# Patient Record
Sex: Female | Born: 1950
Health system: Southern US, Community
[De-identification: ages and names within clinical notes are randomized; demographics above are authoritative.]

## PROBLEM LIST (undated history)

## (undated) DIAGNOSIS — D573 Sickle-cell trait: Secondary | ICD-10-CM

## (undated) DIAGNOSIS — N289 Disorder of kidney and ureter, unspecified: Secondary | ICD-10-CM

## (undated) DIAGNOSIS — E7800 Pure hypercholesterolemia, unspecified: Secondary | ICD-10-CM

## (undated) DIAGNOSIS — I509 Heart failure, unspecified: Secondary | ICD-10-CM

## (undated) DIAGNOSIS — G4730 Sleep apnea, unspecified: Secondary | ICD-10-CM

## (undated) DIAGNOSIS — I10 Essential (primary) hypertension: Secondary | ICD-10-CM

## (undated) HISTORY — DX: Disorder of kidney and ureter, unspecified: N28.9

## (undated) HISTORY — DX: Sleep apnea, unspecified: G47.30

## (undated) HISTORY — DX: Essential (primary) hypertension: I10

## (undated) HISTORY — DX: Sickle-cell trait: D57.3

## (undated) HISTORY — PX: TUBAL LIGATION: SHX77

## (undated) NOTE — ED Provider Notes (Signed)
 Formatting of this note is different from the original. Images from the original note were not included.   [facility] EMERGENCY DEPARTMENT [address] [place] [address]  Patient Name: Olivia Morales Date arrived in ED: 10/16/23  Patient DOB: [birth year] Medical Record #: [id number]  Primary Care: Patient, None Per ED Provider: Coletta Harlene HERO, MD   Chief Complaint & History of Present Illness   Chief Complaint  Patient presents with   Leg Swelling    Pt flew here from [place]  Tuesday. Pt has been dealing with swelling and high blood pressure at with her PCP and cardiologist in [place]  for the past month. Pts complaint today is increase in leg swelling with pain bilaterally. Pt was taken off of her Hydrochlorothiazide  in February and started on Hydralazine  which she is still on.  Pt has been unable to wear shoes d/t pain since Feb. Pt took tylenol  this morning for pain.    Initial Patient Evaluation Time: 15:42  HPI History Provided by: patient Onset: several months ago  Context/Narrative: Olivia Morales is a 67 y.o. female with a h/o T2DM, HTN, HLD, anemia, and aortic stenosis presenting to the ED by personal vehicle with her husband for evaluation of bilateral lower leg swelling that began on 08/16/23. Pt states she has not been able to wear shoes since swelling began. She lives in [place]  and sees a Cardiologist there. Pt had an echocardiogram done 6 days ago and a nuclear stress test done 2 days ago with her Cardiologist. She flew to [facility] from [place]  yesterday and was evaluated by a physician. Pt has taken furosemide  previously, but does not anymore. She is scheduled to fly back to [place]  in 3 days.  Review of Systems   Review of Systems  Cardiovascular:  Positive for leg swelling.  All other systems reviewed and are negative.  Patient Medical History   Other. No LMP recorded. (Menstrual  status: Other). There are no active problems to display for this patient.  Past Medical History:  Diagnosis Date   Diabetes mellitus    Hyperlipidemia    Hypertension    Sickle cell trait    Past Surgical History:  Procedure Laterality Date   TUBAL LIGATION     No family history on file. Social History   Tobacco Use   Smoking status: Never   Smokeless tobacco: Never  Substance Use Topics   Alcohol use: Yes    Alcohol/week: 2.0 standard drinks of alcohol    Types: 2 Cans of beer per week    Comment: 1 beer last night- typically none   Drug use: No    Comment: Former use   Allergies and Outpatient Medications   Allergies:  Allergies  Allergen Reactions   Ace Inhibitors Edema   I have reviewed the current medication list as reported by the patient or outside source and/or known from the electronic health record.  Physical Examination   ED Triage Vitals [10/16/23 1528] BP: 152/75 Heart Rate: 57 Respiratory Rate: 18 Temp: 36.9 C (98.5 F) Temp src: Temporal SpO2: 99 % Weight: 73 kg (160 lb 15 oz) Height: 1.626 m (5' 4)  Pulse Oximetry Interpretation: Not hypoxic  GCS Assessment: Eye Opening: Spontaneous Best Verbal Response: Oriented Best Motor Response: Obeys commands Glasgow Coma Scale Score: 15  Physical Exam Vitals and nursing note reviewed.  Constitutional:      General: She is awake. She is not in acute distress.  Appearance: Normal appearance. She is not ill-appearing, toxic-appearing or diaphoretic.  HENT:     Head: Normocephalic and atraumatic.  Eyes:     Pupils: Pupils are equal, round, and reactive to light.  Neck:     Vascular: JVD (3+) present.  Cardiovascular:     Rate and Rhythm: Normal rate and regular rhythm.     Pulses: Normal pulses.          Dorsalis pedis pulses are 2+ on the right side and 2+ on the left side.     Heart sounds: Normal heart sounds. No murmur heard.    No friction rub. No gallop.  Pulmonary:     Effort:  Pulmonary effort is normal. No respiratory distress.     Breath sounds: Normal breath sounds. No stridor. No wheezing, rhonchi or rales.  Chest:     Chest wall: No tenderness.  Abdominal:     General: Abdomen is flat. There is no distension.  Musculoskeletal:        General: No deformity or signs of injury. Normal range of motion.     Cervical back: Normal range of motion and neck supple.     Right lower leg: No tenderness (to posterior calf). 2+ Pitting Edema (see image below) present.     Left lower leg: 2+ Pitting Edema present.  Skin:    General: Skin is warm and dry.     Coloration: Skin is not pale.     Findings: No erythema or rash.  Neurological:     Mental Status: She is alert and oriented to person, place, and time.  Psychiatric:        Mood and Affect: Mood normal.   ED Course & Progress Notes   ED Medications and Procedures  CBC AND DIFFERENTIAL - Abnormal; Notable for the following components:      Result Value Ref Range   WBC 3.55 (*) 4.50 - 11.00 10*3/uL   RBC 2.84 (*) 4.20 - 5.40 10*6/uL   HGB 7.7 (*) 12.0 - 16.0 g/dL   HCT 76.3 (*) 61.9 - 52.9 %   RDW-CV 14.1 (*) 11.0 - 14.0 %   Neutrophils Absolute 1.91 (*) 2.0 - 8.0 10*3/uL   All other components within normal limits  COMPREHENSIVE METABOLIC PANEL - Abnormal; Notable for the following components:   Potassium 3.4 (*) 3.5 - 5.0 mmol/L   Chloride 111 (*) 98 - 107 mmol/L   Glucose 68 (*) 74 - 109 mg/dL   Creatinine 8.21 (*) 9.48 - 0.95 mg/dL   Total Protein 5.9 (*) 6.4 - 8.3 g/dL   Albumin 3.4 (*) 3.5 - 5.2 g/dL   Bilirubin Total <9.7 (*) 0.2 - 1.0 mg/dL   Osmolality Calculated 301 (*) 275 - 295 mosm/kg   Creatinine Based eGFR 30 (*) >90 mL/min/[1.73_m2]   Comment: GFR 30-59  Moderate decreased GFR. (CKD Stage 3)   All other components within normal limits  PRO B-TYPE NATRIURETIC PEPTIDE - Abnormal; Notable for the following components:   NT-proBNP 2,910 (*) <300 pg/mL   Comment: (NOTE) NT-proBNP values  below 300 pg/mL have a 98% negative predictive value  for excluding acute CHF. For adults 12 to 69 years of age with intact  renal function, NT-proBNP values greater than 900 pg/mL have a positive predictive value of 83% for the diagnosis of CHF. For patients with an eGFR below 60, a cutoff of 1,200 pg/mL yields a diagnostic sensitivity and specificity of 89% and 72% for acute CHF.    All  other components within normal limits  MAGNESIUM   CK  US  DUPLEX EXTREMITY VEINS BILAT  potassium chloride  20 MEQ/15ML (10%) oral solution (60 mEq Oral Given 10/16/23 1649)  furosemide  (LASIX ) injection (40 mg Intravenous Given 10/16/23 1701)  furosemide  (LASIX ) injection (40 mg Intravenous Given 10/16/23 1758)   Imaging Results       US  Duplex Extremity Veins Bilat (Final result)  Result time 10/16/23 17:45:35    Final result by Pearletha Fonda SAUNDERS, MD (10/16/23 17:45:35)        Impression:   No evidence of deep vein thrombosis.   THIS DOCUMENT HAS BEEN ELECTRONICALLY SIGNED BY Fonda Pearletha MD       Narrative:   PROCEDURE INFORMATION:  Exam: US  Duplex Lower Extremity Veins, Bilateral  Exam date and time: 10/16/2023 4:35 PM  Age: 69 years old  Clinical indication: Swelling (edema) of limb; Lower extremity,  bilateral; Additional info: Swelling, pain   TECHNIQUE:  Imaging protocol: Real-time duplex ultrasound of the bilateral  extremities with 2-D gray scale, color Doppler flow and spectral  waveform analysis including responses to compression and other  maneuvers (when performed) with image documentation. Complete exam  focused on the lower extremity veins.   COMPARISON:  No relevant prior studies available.   FINDINGS:  Right deep veins: Unremarkable. The common femoral, femoral, proximal  profunda femoral and popliteal veins are patent without thrombus.  Normal Doppler waveforms. Normal compressibility and/or augmentation  response.    Left deep veins: Unremarkable. The common  femoral, femoral, proximal  profunda femoral and popliteal veins are patent without thrombus.  Normal Doppler waveforms. Normal compressibility and/or augmentation  response.    Superficial veins: Greater saphenous veins at the saphenofemoral  junctions are patent bilaterally without thrombus.   Soft tissues: Unremarkable.              Progress Notes & Physician Consultations: ED Course as of 10/16/23 2332  Wed Oct 16, 2023  1748 Discussed today's findings with the spouse and patient. Provided discharge instructions and answered questions regarding treatment plan. Issued return-to-ED instructions. The patient is stable for discharge. [KH]  1755 Left message with SW for f/u with rapid heart failure clinic. [SM]  1807 Hemoglobins reviewed under care everywhere: baseline appears to be 9-10.5.  Today is 7.7.  Her LE edema is likely multifactorial - a component of heart failure as is being worked up by cardiologist in [place], as well as acute on chronic anemia secondary to likely renal insufficiency.  Not on a blood thinner.  No black or tarry stools.  Iron studies done in early 2025 were normal.  Once diuresed I do believe that her hgb will be back to her baseline.  She is also chronically leukopenic, may benefit from bone marrow biopsy in the future to look for MDS or other marrow suppressing causes of her CBC abnormalities.    She may be establishing all of her care here in [place]  instead of [place] .  If she does decide to do this (she does want to with cardiology as well, and f/u in the HF clinic), she will need PCP and hematology referral in the future.  [SM]   ED Course User Index [KH] Buzzy Harland CROME, ED Scribe [SM] Coletta Harlene HERO, MD   Clinical Impression and Disposition   Clinical Impression:  1. Bilateral leg edema   2. Anemia, unspecified type   3. Chronic renal impairment, unspecified CKD stage   4. Neutropenia, unspecified type    Disposition:  Discharge  Plan: Discharge Medication List as of 10/16/2023  5:48 PM    START taking these medications   Details  furosemide  (LASIX ) 40 MG tablet Take 1 (one) tablet (40 mg total) by mouth daily., Starting Wed 10/16/2023, Normal   potassium chloride  (MICRO-K ) 10 MEQ CR capsule Take 1 (one) capsule (10 mEq total) by mouth 2 (two) times daily for 5 days., Starting Wed 10/16/2023, Until Mon 10/21/2023, Normal     Follow-Up Providers     Cardiologist in [place] as scheduled    Next Steps: Follow up   [facility] Emergency Department  Specialty: Emergency Medicine   Phone: [phone]    Next Steps: Follow up   Instructions: If symptoms worsen     Summary / Medical Decision Making    Provider Summary Notes:  Medical Decision Making:  Review of External Records: See progress note above.  Differential Diagnoses: Heart failure, DVT, metabolic abnormality, renal failure.  Chronic Illnesses Impacting Care: T2DM, HTN, HLD, anemia, aortic stenosis.  ED Lab and Imaging Orders CBC and differential (Olivia Morales, Harlene HERO, MD) Comprehensive metabolic panel Earlis, Harlene HERO, MD) Magnesium  Earlis, Harlene HERO, MD) CK ([address] Harlene HERO, MD) NT-proBNP Earlis, Harlene HERO, MD) US  Duplex Extremity Veins Bilat Earlis, Harlene HERO, MD)  [place] appears well.  See progress note above for review of prior records and lab values.  SW to assist with HF f/u.  Given lasix  and potassium for home.     I have spoken with the patient and spouse, who expressed clear understanding of everything discussed including today's results and findings, and I provided specific details regarding the plan of care, diagnosis, and prognosis.  I answered all questions and there was agreement with the plan.  I discussed specific symptoms and other reasons to return as well as the importance of follow-up.  The following contributed to my medical decisions:  I have reviewed the patient's chief complaint and available past  medical/surgical history, past social history, medications, and allergies and agree with the nursing documentation.  I have reviewed all the patient's vital signs while in the emergency department.  I ordered and independently viewed/reviewed all laboratory and/or radiology tests performed. I ordered and reviewed all medications given. I reviewed all of the pertinent past medical records available in Epic, [facility], from the patient, and/or from an outside facility. I ordered, reviewed, and independently visualized the telemetry monitor tracing.  Scribe Attestation:  I attest that this documentation has been prepared under the direction and in the presence of Olivia Morales, Harlene HERO, MD.  Electronically Signed: Harland LITTIE Mulligan, ED Scribe  Physician Attestation:  I personally performed the services described in this documentation. All medical record entries made by the scribe were at my direction and in my presence. I have reviewed the chart and discharge instructions (if applicable) and agree that the record reflects my personal performance and is accurate and complete. Electronically Signed: Harlene Heater, MD, JD, FACEP 23:32  Note has been documented by Harland L. Heishman on 10/16/2023  Heater Harlene HERO, MD 10/16/23 2333  Electronically signed by Heater Harlene HERO, MD at 10/16/2023 11:33 PM CDT

## (undated) NOTE — Progress Notes (Signed)
 Formatting of this note might be different from the original. Message received. Pt in need of UP/CV Cards follow up for heart failure. Pt is visiting from [place] and often comes back to the area.   SW spoke with Leonor RN who shares availability for today Thursday, 4/17 @ 10:15 AM or tomorrow Friday, 4/18 @ 9:00 AM with Dr. Dennise. SW voiced appreciation.   SW attempted to follow up with pt. No answer. VM left with the above information. SW requested a call back.   SW received a call back from the pt. Pt shares tomorrow works best and she will be there at 8:45 AM. Clinic address and provider name provided. Pt denies further needs/concerns. SW remains available.  Electronically signed by Fleeta Alan SAILOR, MSW at 10/17/2023  9:19 AM CDT

---

## 2000-08-29 ENCOUNTER — Emergency Department (HOSPITAL_COMMUNITY): Admission: EM | Admit: 2000-08-29 | Discharge: 2000-08-29 | Payer: Self-pay | Admitting: Emergency Medicine

## 2002-01-25 ENCOUNTER — Encounter: Payer: Self-pay | Admitting: Emergency Medicine

## 2002-01-25 ENCOUNTER — Inpatient Hospital Stay (HOSPITAL_COMMUNITY): Admission: EM | Admit: 2002-01-25 | Discharge: 2002-02-03 | Payer: Self-pay | Admitting: Psychiatry

## 2002-01-28 ENCOUNTER — Encounter: Payer: Self-pay | Admitting: Emergency Medicine

## 2002-01-28 ENCOUNTER — Emergency Department (HOSPITAL_COMMUNITY): Admission: EM | Admit: 2002-01-28 | Discharge: 2002-01-28 | Payer: Self-pay | Admitting: Emergency Medicine

## 2002-02-12 ENCOUNTER — Other Ambulatory Visit (HOSPITAL_COMMUNITY): Admission: RE | Admit: 2002-02-12 | Discharge: 2002-03-16 | Payer: Self-pay | Admitting: Psychiatry

## 2004-09-28 ENCOUNTER — Emergency Department (HOSPITAL_COMMUNITY): Admission: EM | Admit: 2004-09-28 | Discharge: 2004-09-28 | Payer: Self-pay | Admitting: Family Medicine

## 2005-04-16 ENCOUNTER — Emergency Department (HOSPITAL_COMMUNITY): Admission: EM | Admit: 2005-04-16 | Discharge: 2005-04-16 | Payer: Self-pay | Admitting: Emergency Medicine

## 2005-09-24 ENCOUNTER — Emergency Department (HOSPITAL_COMMUNITY): Admission: EM | Admit: 2005-09-24 | Discharge: 2005-09-24 | Payer: Self-pay | Admitting: Family Medicine

## 2006-04-10 ENCOUNTER — Emergency Department (HOSPITAL_COMMUNITY): Admission: EM | Admit: 2006-04-10 | Discharge: 2006-04-10 | Payer: Self-pay | Admitting: Family Medicine

## 2007-03-02 ENCOUNTER — Emergency Department (HOSPITAL_COMMUNITY): Admission: EM | Admit: 2007-03-02 | Discharge: 2007-03-02 | Payer: Self-pay | Admitting: Emergency Medicine

## 2007-07-31 ENCOUNTER — Emergency Department (HOSPITAL_COMMUNITY): Admission: EM | Admit: 2007-07-31 | Discharge: 2007-07-31 | Payer: Self-pay | Admitting: Emergency Medicine

## 2009-09-20 ENCOUNTER — Emergency Department (HOSPITAL_COMMUNITY): Admission: EM | Admit: 2009-09-20 | Discharge: 2009-09-20 | Payer: Self-pay | Admitting: Emergency Medicine

## 2009-09-22 ENCOUNTER — Emergency Department (HOSPITAL_COMMUNITY): Admission: EM | Admit: 2009-09-22 | Discharge: 2009-09-22 | Payer: Self-pay | Admitting: Emergency Medicine

## 2010-11-17 NOTE — Discharge Summary (Signed)
Olivia Morales, Olivia Morales                            ACCOUNT NO.:  [id number]   MEDICAL RECORD NO.:  [id number]                   PATIENT TYPE:  IPS   LOCATION:  0302                                 FACILITY:  [facility]   PHYSICIAN:  Jeanice Lim, MD                DATE OF BIRTH:  09/16/1950   DATE OF ADMISSION:  01/25/2002  DATE OF DISCHARGE:  02/03/2002                                 DISCHARGE SUMMARY   IDENTIFYING DATA:  This is a 61 year old widowed African-American female  voluntarily admitted with a history of depression and suicidal thoughts and  cocaine abuse.   MEDICATIONS:  Prinivil, Norvasc and hydrochlorothiazide.   ALLERGIES:  Essentially within normal limits.  Neurologically nonfocal.   LABORATORY DATA:  Routine admission labs essentially within normal limits.   MENTAL STATUS EXAM:  Alert, middle-aged, cooperative female resting in bed  with good eye contact.  Speech clear.  Mood depressed.  Affect teary-eyed.  Thought processes goal directed.  Thought content negative for suicidal,  homicidal and psychotic symptoms.  Cognitively intact.  Judgment and insight  impaired and fair.   ADMISSION DIAGNOSES:   AXIS I:  1. Depressive disorder not otherwise specified.  2. Cocaine dependence.   AXIS II:  None.   AXIS III:  1. Hypertension.  2. Sickle-cell anemia trait.  3. Plantar fasciitis.   AXIS IV:  Moderate (problems with primary support system).   AXIS V:  30/55.   HOSPITAL COURSE:  The patient was admitted and ordered routine p.r.n.  medications and underwent further monitoring and was encouraged to  participate in individual, group and milieu therapy.  The patient was  resumed on Prinivil, Norvasc, hydrochlorothiazide and started on Zoloft  targeting depressive symptoms.  The patient tolerated Zoloft without  significant side effects and reported a decrease in depressive symptoms,  occasional cravings but appeared motivated to be compliant with the  aftercare  plan and remain sober.  The patient was to follow up with her  primary care physician regarding abnormal labs.   CONDITION ON DISCHARGE:  Improved.  Mood was more euthymic.  Affect  brighter.  Thought processes goal directed.  Thought content negative for  dangerous or psychotic symptoms and patient appeared motivated to remain  sober.   DISCHARGE MEDICATIONS:  1. Zoloft 50 mg q.a.m.  2. Anaprox 275 mg, 2 b.i.d. as needed p.r.n.   [facility] February [facility], [facility] at  4 p.m.   DISCHARGE DIAGNOSES:   AXIS I:  1. Depressive disorder not otherwise specified.  2. Cocaine dependence.   AXIS II:  None.   AXIS III:  1. Hypertension.  2. Sickle-cell anemia trait.  3. Plantar fasciitis.   AXIS IV:  Moderate (problems with primary support system).   AXIS V:  Global Assessment of Functioning on discharge 55.  Jeanice Lim, MD    JEM/MEDQ  D:  03/05/2002  T:  03/05/2002  Job:  [phone]

## 2010-11-17 NOTE — H&P (Signed)
[facility]  Patient:    Olivia Morales, Olivia Morales Visit Number: [id number] MRN: [id number]          Service Type: Attending:  Jeanice Lim, M.D. Dictated by:   Candi Leash. Orsini, N.P. Adm. Date:  01/25/02                     Psychiatric Admission Assessment  IDENTIFYING INFORMATION:  A 60 year old widowed African-American female who was voluntarily admitted on January 25, 2002.  HISTORY OF PRESENT ILLNESS:  The patient presents with a history of depression, suicidal thoughts.  Has a history of cocaine abuse, rule out dependence.  The patient has been smoking cocaine for the past 18 years.  The patient reports she is feeling depressed.  She is having conflicts with her daughter.  She feels that they do not care and just want her money.  The patient states she had a recent cocaine binge after she did not hear from her daughter.  She is having passive suicidal thoughts but will contract for safety.  Her sleeping has been decreased, her appetite has been decreased. She has lost 20 pounds.  She denies any psychosis, reports no cravings and states that she will remain sober by keeping close contact with her 6 grandchildren that she has.  PAST PSYCHIATRIC HISTORY:  First admission to [facility]. History of detox in 1993.  No history of a suicide attempt.  SOCIAL HISTORY:   She is a 60 year old widowed African-American female.  She has been widowed since 1999.  She has 3 children, ages 31, 57, and 26.  She lives alone.  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  No legal problems.  FAMILY HISTORY:  Unknown.  ALCOHOL DRUG HISTORY:  Nonsmoker, denies any alcohol use.  The patient has a history of smoking cocaine for the past 18 years, every 2 weeks when she gets paid.  PAST MEDICAL HISTORY:  Primary care Elim Economou is unknown.  Medical problems are plantar fasciitis, sickle cell anemia trait, and hypertension.  MEDICATIONS:  Prinivil 40 mg,  Norvasc 10 mg, hydrochlorothiazide 25 mg.  DRUG ALLERGIES:  No known allergies.  PHYSICAL EXAMINATION:  Performed in the emergency department.  Her urine pregnancy test is negative.  Alcohol level was less than 5.  Urine drug screen was positive for cocaine.  MENTAL STATUS EXAMINATION:  She is an alert, middle-aged, cooperative female, resting in bed, with good eye contact.  Speech is clear, mood is depressed, affect is teary-eyed.  Thought processes are coherent.  No evidence of psychosis, no auditory or visual hallucinations, no suicidal or homicidal ideations.  Cognitive function intact.  Memory is good, judgment impaired, insight is fair.  ADMISSION DIAGNOSES: Axis I:    1. Depressive disorder not otherwise specified.            2. Cocaine dependence. Axis II:   Deferred. Axis III:  Hypertension and sickle cell anemia trait, and plantar fasciitis. Axis IV:   Problems with primary support group, other psychosocial problems. Axis V:    Current is 30, this past year 109.  PLAN:  Voluntary admission to [facility] for depression, suicidal ideation and cocaine abuse.  Contract for safety, check every 15 minutes.  Will obtain labs, initiate Zoloft to decrease depressive symptoms, resume her hypertension medications.  The patient to attend groups.  Have a family session.  Increase coping skills.  Follow up with mental health and remain drug free.  TENTATIVE LENGTH OF CARE:  3-5 days. Dictated by:   Candi Leash. Orsini, N.P. Attending:  Jeanice Lim, M.D. DD:  01/26/02 TD:  01/26/02 Job: [id number]

## 2011-04-13 LAB — CBC
HCT: 34.1 — ABNORMAL LOW
Hemoglobin: 11.6 — ABNORMAL LOW
MCHC: 33.8
MCV: 80.4
Platelets: 300
RBC: 4.25
RDW: 13.8
WBC: 3.8 — ABNORMAL LOW

## 2011-04-13 LAB — POCT CARDIAC MARKERS
CKMB, poc: 1.9
Myoglobin, poc: 75.2
Operator id: 4534
Troponin i, poc: <0.05

## 2011-04-13 LAB — COMPREHENSIVE METABOLIC PANEL
ALT: 16
AST: 25
Albumin: 4.3
Alkaline Phosphatase: 84
BUN: 12
CO2: 25
Calcium: 9.7
Chloride: 106
Creatinine, Ser: 0.92
GFR calc Af Amer: >60
GFR calc non Af Amer: >60
Glucose, Bld: 74
Potassium: 3.4 — ABNORMAL LOW
Sodium: 140
Total Bilirubin: 1.4 — ABNORMAL HIGH
Total Protein: 7.9

## 2011-04-13 LAB — D-DIMER, QUANTITATIVE: D-Dimer, Quant: 0.28

## 2011-04-13 LAB — DIFFERENTIAL
Basophils Absolute: 0
Basophils Relative: 1
Eosinophils Absolute: 0.1
Eosinophils Relative: 2
Lymphocytes Relative: 38
Lymphs Abs: 1.4
Monocytes Absolute: 0.4
Monocytes Relative: 12 — ABNORMAL HIGH
Neutro Abs: 1.8
Neutrophils Relative %: 48

## 2011-06-13 ENCOUNTER — Encounter: Payer: Self-pay | Admitting: Dietician

## 2011-06-13 ENCOUNTER — Encounter: Payer: 59 | Attending: Family Medicine | Admitting: Dietician

## 2011-06-13 DIAGNOSIS — I10 Essential (primary) hypertension: Secondary | ICD-10-CM | POA: Insufficient documentation

## 2011-06-13 DIAGNOSIS — Z713 Dietary counseling and surveillance: Secondary | ICD-10-CM | POA: Insufficient documentation

## 2011-06-13 DIAGNOSIS — E119 Type 2 diabetes mellitus without complications: Secondary | ICD-10-CM | POA: Insufficient documentation

## 2011-06-13 NOTE — Progress Notes (Signed)
  Patient was seen on 06/13/2011 for the first of a series of three diabetes self-management courses at [facility]. The following learning objectives were met by the patient during this course:   Defines the role of glucose and insulin  Identifies type of diabetes and pathophysiology  Defines the diagnostic criteria for diabetes and prediabetes  States the risk factors for Type 2 Diabetes  States the symptoms of Type 2 Diabetes  Defines Type 2 Diabetes treatment goals  Defines Type 2 Diabetes treatment options  States the rationale for glucose monitoring  Identifies A1C, glucose targets, and testing times  Identifies proper sharps disposal  Defines the purpose of a diabetes food plan  Identifies carbohydrate food groups  Defines effects of carbohydrate foods on glucose levels  Identifies carbohydrate choices/grams/food labels  States benefits of physical activity and effect on glucose  Review of suggested activity guidelines  Handouts given during class include:  Type 2 Diabetes: Basics Book  My Food Plan Book  Food and Activity Log  Patient has established the following initial goals:  Increase exercise  Follow a diabetes meal plan  Lose weight  Follow-Up Plan: Attend the series of DM self-management classes

## 2011-06-13 NOTE — Patient Instructions (Signed)
Goals:  Follow Diabetes Meal Plan as instructed  Eat 3 meals and 2 snacks, every 3-5 hrs  Limit carbohydrate intake to 30-45 grams carbohydrate/meal  Limit carbohydrate intake to 15 grams carbohydrate/snack  Add lean protein foods to meals/snacks  Monitor glucose levels as instructed by your doctor  Aim for 30 mins of physical activity daily  Bring food record and glucose log to your next nutrition visit 

## 2011-07-12 ENCOUNTER — Ambulatory Visit: Payer: 59

## 2011-07-19 ENCOUNTER — Ambulatory Visit: Payer: 59

## 2011-09-13 ENCOUNTER — Encounter: Payer: 59 | Attending: Family Medicine | Admitting: *Deleted

## 2011-09-13 DIAGNOSIS — E119 Type 2 diabetes mellitus without complications: Secondary | ICD-10-CM | POA: Insufficient documentation

## 2011-09-13 DIAGNOSIS — Z713 Dietary counseling and surveillance: Secondary | ICD-10-CM | POA: Insufficient documentation

## 2011-09-13 DIAGNOSIS — I10 Essential (primary) hypertension: Secondary | ICD-10-CM | POA: Insufficient documentation

## 2011-09-14 ENCOUNTER — Encounter: Payer: Self-pay | Admitting: *Deleted

## 2011-09-14 NOTE — Progress Notes (Signed)
  Patient was seen on 09/13/2011 for the second of a series of three diabetes self-management courses at the [facility]. The following learning objectives were met by the patient during this course:   Explain basic nutrition maintenance and quality assurance  Describe causes, symptoms and treatment of hypoglycemia and hyperglycemia  Explain how to manage diabetes during illness  Describe the importance of good nutrition for health and healthy eating strategies  List strategies to follow meal plan when dining out  Describe the effects of alcohol on glucose and how to use it safely  Describe problem solving skills for day-to-day glucose challenges  Describe strategies to use when treatment plan needs to change  Identify important factors involved in successful weight loss  Describe ways to remain physically active  Describe the impact of regular activity on insulin resistance  Patient updated their initials goals from Core Class I to include:  Eat more fresh fruits and vegetables  Walk around parking log 15 minutes 3 days a week    Handouts given in class:  Refrigerator magnet for Sick Day Guidelines  [facility] Oral medication/insulin handout  Follow-Up Plan: Patient will attend the final class of the ADA Diabetes Self-Care Education.

## 2011-09-20 ENCOUNTER — Ambulatory Visit: Payer: 59

## 2011-10-31 DIAGNOSIS — I10 Essential (primary) hypertension: Secondary | ICD-10-CM | POA: Insufficient documentation

## 2011-10-31 DIAGNOSIS — E1159 Type 2 diabetes mellitus with other circulatory complications: Secondary | ICD-10-CM | POA: Insufficient documentation

## 2011-10-31 DIAGNOSIS — E119 Type 2 diabetes mellitus without complications: Secondary | ICD-10-CM | POA: Insufficient documentation

## 2011-11-15 ENCOUNTER — Ambulatory Visit: Payer: 59

## 2011-11-22 ENCOUNTER — Encounter: Payer: 59 | Attending: Family Medicine | Admitting: Dietician

## 2011-11-22 DIAGNOSIS — I10 Essential (primary) hypertension: Secondary | ICD-10-CM | POA: Insufficient documentation

## 2011-11-22 DIAGNOSIS — Z713 Dietary counseling and surveillance: Secondary | ICD-10-CM | POA: Insufficient documentation

## 2011-11-22 DIAGNOSIS — E119 Type 2 diabetes mellitus without complications: Secondary | ICD-10-CM | POA: Insufficient documentation

## 2011-11-26 NOTE — Progress Notes (Signed)
  Patient was seen on 11/22/2011 for the third of a series of three diabetes self-management courses at the [facility]. The following learning objectives were met by the patient during this course:    Describe how diabetes changes over time   Identify diabetes complications and ways to prevent them   Describe strategies that can promote heart health including lowering blood pressure and cholesterol   Describe strategies to lower dietary fat and sodium in the diet   Identify physical activities that benefit cardiovascular health   Evaluate success in meeting personal goal   Describe the belief that they can live successfully with diabetes day to day   Establish 2-3 goals that they will plan to diligently work on until they return for the free 66-month follow-up visit  The following handouts were given in class:  3 Month Follow Up Visit handout  Goal setting handout  Class evaluation form  Your patient has established the following 3 month goals for diabetes self-care:  Count carbohydrates at most of my meals and snacks.  Reduce fat in my diet by eating less fried food at two or more meals a day.  Eat more fresh fruits and vegetables.  Increase my activity (for example, take the stairs at least 5 days a week.  Walk more five times a round parking lot = 1 mile.  Follow-Up Plan: Patient will attend a 3 month follow-up visit for diabetes self-management education.

## 2012-02-28 ENCOUNTER — Encounter: Payer: 59 | Attending: Physician Assistant | Admitting: Dietician

## 2012-03-11 ENCOUNTER — Encounter: Payer: Self-pay | Admitting: Cardiology

## 2013-08-31 ENCOUNTER — Encounter (HOSPITAL_COMMUNITY): Payer: Self-pay | Admitting: Emergency Medicine

## 2013-08-31 ENCOUNTER — Emergency Department (HOSPITAL_COMMUNITY)
Admission: EM | Admit: 2013-08-31 | Discharge: 2013-08-31 | Disposition: A | Discharge status: Home / Self Care | Payer: 59 | Attending: Emergency Medicine | Admitting: Emergency Medicine

## 2013-08-31 DIAGNOSIS — K0889 Other specified disorders of teeth and supporting structures: Secondary | ICD-10-CM

## 2013-08-31 DIAGNOSIS — IMO0002 Reserved for concepts with insufficient information to code with codable children: Secondary | ICD-10-CM | POA: Insufficient documentation

## 2013-08-31 DIAGNOSIS — I10 Essential (primary) hypertension: Secondary | ICD-10-CM | POA: Insufficient documentation

## 2013-08-31 DIAGNOSIS — Z8669 Personal history of other diseases of the nervous system and sense organs: Secondary | ICD-10-CM | POA: Insufficient documentation

## 2013-08-31 DIAGNOSIS — Z98811 Dental restoration status: Secondary | ICD-10-CM | POA: Insufficient documentation

## 2013-08-31 DIAGNOSIS — Z79899 Other long term (current) drug therapy: Secondary | ICD-10-CM | POA: Insufficient documentation

## 2013-08-31 DIAGNOSIS — E119 Type 2 diabetes mellitus without complications: Secondary | ICD-10-CM | POA: Insufficient documentation

## 2013-08-31 DIAGNOSIS — K089 Disorder of teeth and supporting structures, unspecified: Secondary | ICD-10-CM | POA: Insufficient documentation

## 2013-08-31 HISTORY — DX: Pure hypercholesterolemia, unspecified: E78.00

## 2013-08-31 MED ORDER — HYDROCODONE-ACETAMINOPHEN 5-325 MG PO TABS: 2.0000 | ORAL_TABLET | ORAL | Status: DC | PRN

## 2013-08-31 MED ORDER — CLINDAMYCIN HCL 150 MG PO CAPS: 150.0000 mg | ORAL_CAPSULE | Freq: Four times a day (QID) | ORAL | Status: DC

## 2013-08-31 NOTE — ED Notes (Signed)
Pt presents with c/o left sided dental pain, head ache and eye pain. States she has been used oragel. Plans to see the dentist but was in too much pain.

## 2013-08-31 NOTE — Discharge Instructions (Signed)

## 2013-08-31 NOTE — ED Provider Notes (Signed)
CSN: [id number]     Arrival date & time 08/31/13  1523 History   First MD Initiated Contact with Patient 08/31/13 1558     Chief Complaint  Patient presents with  . Dental Pain     (Consider location/radiation/quality/duration/timing/severity/associated sxs/prior Treatment) Patient is a 63 y.o. female presenting with tooth pain. The history is provided by the patient. No language interpreter was used.  Dental Pain Location:  Upper and lower Upper teeth location:  14/LU 1st molar Lower teeth location:  29/RL 2nd bicuspid Quality:  Aching and sharp Severity:  Severe Onset quality:  Gradual Duration:  2 days Timing:  Constant Progression:  Worsening Relieved by:  Nothing Worsened by:  Nothing tried Ineffective treatments:  Acetaminophen Associated symptoms: facial pain   Risk factors: diabetes   Pain in gum upper and lower  Past Medical History  Diagnosis Date  . Diabetes mellitus   . Hypertension   . Sleep apnea    No past surgical history on file. No family history on file. History  Substance Use Topics  . Smoking status: Never Smoker   . Smokeless tobacco: Not on file  . Alcohol Use: Yes   OB History   Grav Para Term Preterm Abortions TAB SAB Ect Mult Living                 Review of Systems  All other systems reviewed and are negative.      Allergies  Review of patient's allergies indicates no known allergies.  Home Medications   Current Outpatient Rx  Name  Route  Sig  Dispense  Refill  . amLODipine (NORVASC) 10 MG tablet   Oral   Take 10 mg by mouth daily.          . benzocaine (KANK-A MOUTH PAIN) 20 % SOLN   Mouth/Throat   Use as directed 1 application in the mouth or throat as needed for mouth pain.         . benzocaine (ORAJEL) 10 % mucosal gel   Mouth/Throat   Use as directed 1 application in the mouth or throat as needed for mouth pain.         . diphenhydramine-acetaminophen (TYLENOL PM) 25-500 MG TABS   Oral   Take 2 tablets by  mouth every 4 (four) hours as needed (pain).         . fluticasone (FLONASE) 50 MCG/ACT nasal spray   Each Nare   Place 1 spray into both nostrils daily.         . hydrochlorothiazide (MICROZIDE) 12.5 MG capsule   Oral   Take 1 capsule by mouth daily.         Marland Kitchen losartan (COZAAR) 25 MG tablet   Oral   Take 25 mg by mouth daily.           . metFORMIN (GLUCOPHAGE-XR) 500 MG 24 hr tablet   Oral   Take 1,000 mg by mouth daily with breakfast.          . metoprolol succinate (TOPROL-XL) 50 MG 24 hr tablet   Oral   Take 1 tablet by mouth daily.         Marland Kitchen PRESCRIPTION MEDICATION   Both Eyes   Place 1 drop into both eyes daily. For dry eye and allergies.          BP 127/65  Pulse 59  Temp(Src) 98.4 F (36.9 C) (Oral)  Resp 20  SpO2 96% Physical Exam  Nursing note and vitals  reviewed. Constitutional: She is oriented to person, place, and time. She appears well-developed and well-nourished.  HENT:  Head: Normocephalic.  No obvious cavity,  No abscess  Multiple fillings  Eyes: Pupils are equal, round, and reactive to light.  Cardiovascular: Normal rate.   Pulmonary/Chest: Effort normal.  Musculoskeletal: Normal range of motion.  Neurological: She is alert and oriented to person, place, and time. She has normal reflexes.  Skin: Skin is warm.  Psychiatric: She has a normal mood and affect.    ED Course  Procedures (including critical care time) Labs Review Labs Reviewed - No data to display Imaging Review No results found.   EKG Interpretation None      MDM [facility].   No fever, no sign of abscess.  I will treat pain and start antibiotic   Final diagnoses:  Toothache        Elson AreasLeslie K Sofia, PA-C 08/31/13 [address]1608  Leslie K La TierraSofia, [place] JerseyPA-C 08/31/13 [phone]

## 2013-09-01 NOTE — ED Provider Notes (Signed)
Medical screening examination/treatment/procedure(s) were performed by non-physician practitioner and as supervising physician I was immediately available for consultation/collaboration.   EKG Interpretation None        Olivia SkeensJoshua M Porscha Axley, MD 09/01/13 [phone]

## 2013-09-14 DIAGNOSIS — E1169 Type 2 diabetes mellitus with other specified complication: Secondary | ICD-10-CM | POA: Insufficient documentation

## 2013-09-14 DIAGNOSIS — E785 Hyperlipidemia, unspecified: Secondary | ICD-10-CM

## 2014-01-11 DIAGNOSIS — N183 Chronic kidney disease, stage 3 unspecified: Secondary | ICD-10-CM | POA: Insufficient documentation

## 2014-01-15 ENCOUNTER — Other Ambulatory Visit (HOSPITAL_COMMUNITY): Payer: Self-pay | Admitting: *Deleted

## 2014-01-18 ENCOUNTER — Inpatient Hospital Stay (HOSPITAL_COMMUNITY): Admission: RE | Admit: 2014-01-18 | Payer: 59 | Source: Ambulatory Visit

## 2014-01-25 ENCOUNTER — Ambulatory Visit (HOSPITAL_COMMUNITY)
Admission: RE | Admit: 2014-01-25 | Discharge: 2014-01-25 | Disposition: A | Discharge status: Home / Self Care | Payer: 59 | Source: Ambulatory Visit | Attending: Nephrology | Admitting: Nephrology

## 2014-01-25 DIAGNOSIS — E1129 Type 2 diabetes mellitus with other diabetic kidney complication: Secondary | ICD-10-CM | POA: Insufficient documentation

## 2014-01-25 DIAGNOSIS — N183 Chronic kidney disease, stage 3 unspecified: Secondary | ICD-10-CM | POA: Diagnosis present

## 2014-01-25 DIAGNOSIS — I129 Hypertensive chronic kidney disease with stage 1 through stage 4 chronic kidney disease, or unspecified chronic kidney disease: Secondary | ICD-10-CM | POA: Insufficient documentation

## 2014-01-25 MED ORDER — SODIUM CHLORIDE 0.9 % IV SOLN
1020.0000 mg | Freq: Once | INTRAVENOUS | Status: AC
  Administered 2014-01-25: 1020 mg via INTRAVENOUS
  Filled 2014-01-25: qty 34

## 2015-07-02 ENCOUNTER — Emergency Department (HOSPITAL_COMMUNITY)
Admission: EM | Admit: 2015-07-02 | Discharge: 2015-07-02 | Disposition: A | Discharge status: Home / Self Care | Payer: 59 | Attending: Emergency Medicine | Admitting: Emergency Medicine

## 2015-07-02 ENCOUNTER — Encounter (HOSPITAL_COMMUNITY): Payer: Self-pay

## 2015-07-02 ENCOUNTER — Emergency Department (HOSPITAL_COMMUNITY): Payer: 59

## 2015-07-02 DIAGNOSIS — Z8669 Personal history of other diseases of the nervous system and sense organs: Secondary | ICD-10-CM | POA: Diagnosis not present

## 2015-07-02 DIAGNOSIS — Z792 Long term (current) use of antibiotics: Secondary | ICD-10-CM | POA: Diagnosis not present

## 2015-07-02 DIAGNOSIS — M79672 Pain in left foot: Secondary | ICD-10-CM

## 2015-07-02 DIAGNOSIS — Y9389 Activity, other specified: Secondary | ICD-10-CM | POA: Insufficient documentation

## 2015-07-02 DIAGNOSIS — Y9289 Other specified places as the place of occurrence of the external cause: Secondary | ICD-10-CM | POA: Insufficient documentation

## 2015-07-02 DIAGNOSIS — E119 Type 2 diabetes mellitus without complications: Secondary | ICD-10-CM | POA: Diagnosis not present

## 2015-07-02 DIAGNOSIS — W228XXA Striking against or struck by other objects, initial encounter: Secondary | ICD-10-CM | POA: Diagnosis not present

## 2015-07-02 DIAGNOSIS — S99922A Unspecified injury of left foot, initial encounter: Secondary | ICD-10-CM | POA: Diagnosis present

## 2015-07-02 DIAGNOSIS — Z7951 Long term (current) use of inhaled steroids: Secondary | ICD-10-CM | POA: Diagnosis not present

## 2015-07-02 DIAGNOSIS — Z79899 Other long term (current) drug therapy: Secondary | ICD-10-CM | POA: Insufficient documentation

## 2015-07-02 DIAGNOSIS — I10 Essential (primary) hypertension: Secondary | ICD-10-CM | POA: Diagnosis not present

## 2015-07-02 DIAGNOSIS — Y998 Other external cause status: Secondary | ICD-10-CM | POA: Diagnosis not present

## 2015-07-02 MED ORDER — IBUPROFEN 800 MG PO TABS
800.0000 mg | ORAL_TABLET | Freq: Once | ORAL | Status: AC
  Administered 2015-07-02: 800 mg via ORAL
  Filled 2015-07-02: qty 1

## 2015-07-02 MED ORDER — IBUPROFEN 800 MG PO TABS: 800.0000 mg | ORAL_TABLET | Freq: Three times a day (TID) | ORAL | Status: DC

## 2015-07-02 NOTE — ED Provider Notes (Signed)
CSN: [id number]     Arrival date & time 07/02/15  1009 History   First MD Initiated Contact with Patient 07/02/15 1039     Chief Complaint  Patient presents with  . Foot Pain     (Consider location/radiation/quality/duration/timing/severity/associated sxs/prior Treatment) Patient is a 64 y.o. female presenting with lower extremity pain. The history is provided by the patient.  Foot Pain This is a new problem. The current episode started yesterday. The problem occurs constantly. The problem has been unchanged. Pertinent negatives include no fever, joint swelling, numbness or weakness. The symptoms are aggravated by walking. She has tried nothing for the symptoms. The treatment provided no relief.   Olivia Morales is a 64 y.o. female with PMH significant for DM, HTN, HLD who presents with moderate, constant, worsening left foot pain since yesterday.  She does not report anything in particular that occurred yesterday.  Her boyfriend reports she stepped off the curb and seemed to jam her foot.  No twisting or ankle roll.  No meds PTA.  She denies numbness, weakness, fever, or any other pain.   Past Medical History  Diagnosis Date  . Diabetes mellitus   . Hypertension   . Sleep apnea   . Hypercholesteremia    History reviewed. No pertinent past surgical history. History reviewed. No pertinent family history. Social History  Substance Use Topics  . Smoking status: Never Smoker   . Smokeless tobacco: None  . Alcohol Use: No   OB History    No data available     Review of Systems  Constitutional: Negative for fever.  Musculoskeletal: Negative for joint swelling.  Neurological: Negative for weakness and numbness.   All other systems negative unless otherwise stated in HPI    Allergies  Review of patient's allergies indicates no known allergies.  Home Medications   Prior to Admission medications   Medication Sig Start Date End Date Taking? Authorizing Provider    amLODipine (NORVASC) 10 MG tablet Take 10 mg by mouth daily.     Historical Provider, MD  benzocaine (KANK-A MOUTH PAIN) 20 % SOLN Use as directed 1 application in the mouth or throat as needed for mouth pain.    Historical Provider, MD  benzocaine (ORAJEL) 10 % mucosal gel Use as directed 1 application in the mouth or throat as needed for mouth pain.    Historical Provider, MD  clindamycin (CLEOCIN) 150 MG capsule Take 1 capsule (150 mg total) by mouth every 6 (six) hours. 08/31/13   Elson Areas, PA-C  diphenhydramine-acetaminophen (TYLENOL PM) 25-500 MG TABS Take 2 tablets by mouth every 4 (four) hours as needed (pain).    Historical Provider, MD  fluticasone (FLONASE) 50 MCG/ACT nasal spray Place 1 spray into both nostrils daily.    Historical Provider, MD  hydrochlorothiazide (MICROZIDE) 12.5 MG capsule Take 1 capsule by mouth daily. 07/17/13   Historical Provider, MD  HYDROcodone-acetaminophen (NORCO/VICODIN) 5-325 MG per tablet Take 2 tablets by mouth every 4 (four) hours as needed. 08/31/13   Elson Areas, PA-C  losartan (COZAAR) 25 MG tablet Take 25 mg by mouth daily.      Historical Provider, MD  metFORMIN (GLUCOPHAGE-XR) 500 MG 24 hr tablet Take 1,000 mg by mouth daily with breakfast.     Historical Provider, MD  metoprolol succinate (TOPROL-XL) 50 MG 24 hr tablet Take 1 tablet by mouth daily. 08/22/13   Historical Provider, MD  PRESCRIPTION MEDICATION Place 1 drop into both eyes daily. For dry eye and allergies.  Historical Provider, MD   BP 148/85 mmHg  Pulse 62  Temp(Src) 98.1 F (36.7 C) (Oral)  Resp 20  SpO2 100% Physical Exam  Constitutional: She is oriented to person, place, and time. She appears well-developed and well-nourished.  HENT:  Head: Atraumatic.  Eyes: Conjunctivae are normal. No scleral icterus.  Neck: No tracheal deviation present.  Cardiovascular:  Pulses:      Dorsalis pedis pulses are 2+ on the right side, and 2+ on the left side.  Capillary refill less  than 3 seconds.   Pulmonary/Chest: Effort normal. No respiratory distress.  Musculoskeletal: Normal range of motion. She exhibits tenderness.       Right ankle: Normal.       Left ankle: She exhibits normal range of motion, no swelling, no ecchymosis, no deformity, no laceration and normal pulse. No tenderness.       Right foot: Normal.       Left foot: There is tenderness. There is normal range of motion, no swelling, normal capillary refill, no crepitus, no deformity and no laceration.       Feet:  Left foot TTP along dorsal aspect.  Neurological: She is alert and oriented to person, place, and time.  Strength and sensation intact bilaterally throughout lower extremities.   Skin: Skin is warm and dry.  No swelling, erythema, or temperature change.  Psychiatric: She has a normal mood and affect. Her behavior is normal.    ED Course  Procedures (including critical care time) Labs Review Labs Reviewed - No data to display  Imaging Review Dg Foot Complete Left  07/02/2015  CLINICAL DATA:  Left foot pain.  Twisting injury yesterday. EXAM: LEFT FOOT - COMPLETE 3+ VIEW COMPARISON:  None. FINDINGS: There is no evidence of fracture or dislocation. There is no evidence of arthropathy or other focal bone abnormality. Soft tissues are unremarkable. IMPRESSION: Negative. Electronically Signed   By: Francene BoyersJames  Maxwell M.D.   On: 07/02/2015 11:13   I have personally reviewed and evaluated these images and lab results as part of my medical decision-making.   EKG Interpretation None      MDM   Final diagnoses:  Left foot pain    Patient presents with left foot pain since yesterday.  No fever, numbness, weakness.  VSS, NAD.  On exam, NVI.  FAROM.  TTP along dorsal aspect of left foot.  Plain films negative.  Will apply ace wrap, post op shoe, and motrin.  Doubt neuropathy or arterial/venous occlusion.  Evaluation does not show pathology requiring ongoing emergent intervention or admission. Pt is  hemodynamically stable and mentating appropriately. Discussed findings/results and plan with patient/guardian, who agrees with plan. All questions answered. Return precautions discussed and outpatient follow up given.      Olivia FowlerKayla Shavon Zenz, PA-C 07/02/15 1135  Laurence Spatesachel Morgan Little, MD 07/02/15 [phone]

## 2015-07-02 NOTE — Discharge Instructions (Signed)

## 2015-07-02 NOTE — ED Notes (Signed)
Pt states pain to upper and lower foot.  Pt boyfriend states she stepped wrong on curb yesterday.  Pt denies wrist pain.

## 2016-02-06 ENCOUNTER — Institutional Professional Consult (permissible substitution): Payer: 59 | Admitting: Neurology

## 2016-02-06 ENCOUNTER — Telehealth: Payer: Self-pay

## 2016-02-06 NOTE — Telephone Encounter (Signed)
I spoke to pt and advised her that Dr. Vickey Hugerohmeier is out sick today and we will need to reschedule her appt. Pt is agreeable to a 02/08/16 appt at 11:30. Pt verbalized understanding.

## 2016-02-08 ENCOUNTER — Encounter: Payer: Self-pay | Admitting: Neurology

## 2016-02-08 ENCOUNTER — Ambulatory Visit (INDEPENDENT_AMBULATORY_CARE_PROVIDER_SITE_OTHER): Payer: 59 | Admitting: Neurology

## 2016-02-08 VITALS — BP 118/78 | HR 84 | Ht 64.0 in | Wt 188.0 lb

## 2016-02-08 DIAGNOSIS — R351 Nocturia: Secondary | ICD-10-CM | POA: Diagnosis not present

## 2016-02-08 DIAGNOSIS — E1310 Other specified diabetes mellitus with ketoacidosis without coma: Secondary | ICD-10-CM | POA: Diagnosis not present

## 2016-02-08 DIAGNOSIS — I10 Essential (primary) hypertension: Secondary | ICD-10-CM | POA: Diagnosis not present

## 2016-02-08 DIAGNOSIS — Z9114 Patient's other noncompliance with medication regimen: Secondary | ICD-10-CM

## 2016-02-08 DIAGNOSIS — G4733 Obstructive sleep apnea (adult) (pediatric): Secondary | ICD-10-CM

## 2016-02-08 DIAGNOSIS — G4730 Sleep apnea, unspecified: Secondary | ICD-10-CM

## 2016-02-08 DIAGNOSIS — R0683 Snoring: Secondary | ICD-10-CM | POA: Diagnosis not present

## 2016-02-08 DIAGNOSIS — G4710 Hypersomnia, unspecified: Secondary | ICD-10-CM | POA: Diagnosis not present

## 2016-02-08 DIAGNOSIS — E1110 Type 2 diabetes mellitus with ketoacidosis without coma: Secondary | ICD-10-CM

## 2016-02-08 NOTE — Progress Notes (Addendum)
[facility]IC   Provider:  Melvyn Novas, M D  Referring Provider: Lucila Maine Primary Care Physician:  Devra Dopp, MD  Chief Complaint  Patient presents with  . New Patient (Initial Visit)    hasn't used cpap in years, been over 10 years    HPI:  Olivia Morales is a 65 y.o. female , seen here as a referral from [place] [facility] for a sleep study,   Chief complaint according to patient : " More than 10 years ago I visited my son in [place] and my son recorded my snoring and apnea "  Not my fianc is reporting the same thing.  At the time she returned from [place] where she was evaluated in the sleep study for obstructive sleep apnea, was diagnosed and prescribed a CPAP. She has not been using it regularly and she says that her grandkids were scared of it. Comorbidities exist the patient is considered obese, has diabetes, is also followed for chronic kidney disease stage III by Dr. Sheralyn Boatman, she has hypertension is on 4 medications for hypertension. She reports that at times she gets short of breath, her legs can swell up,  she is very thirsty -more than she used to be,  and she has a lot of joint pain. She also has polyuria and nocturia interrupting her sleep at night. I reviewed the patient's medications which include amlodipine, hydrochlorothiazide, losartan, metoprolol, metformin, omeprazole, Lipitor, and Tylenol PM.  A CPAP download was obtained the patient used the device last night for 2 hours and 54 minutes, with an average AHI of 14.7 this is actually a BiPAP device expiratory pressure relief is 12 cm inspiratory pressure support 16 cm water. She had a sleep study at [facility] center on [facility], the facility does not longer exist. Her last sleep study was over 10 years ago.  Sleep habits are as follows: The patient usually watches TV and the news at 11 PM and goes to the back room between midnight and 1 AM. In her bedroom she still has a  TV running in the background., She falls asleep very promptly but likes the TV in the background. She sleeps on her sides -usually with 2 pillows for head support.  Her fianc shares a bed with her and has noted that she snores almost immediately after she falls asleep. Sometimes she falls asleep while they have conversations in bed. She almost hourly goes to the bathroom at night, she has urge incontinence during the day as well. She needs often to change her underwear because of this. Does  seem to have vivid dreams, and often. The dream is very real to her, and she and he is dreaming the same theme over and over when she has a sleep interruption and goes back to dreaming. Her fianc also states that she sleep talks. She doesn't have to go to the bathroom nearly as often and daytime as she does at night. She can sleep up to 11 or 12 noon as long as she has no appointments in the morning. She usually eats breakfast takes a nap she says that it's time for lunch and after lunch she wants to take another nap she is very excessively sleepy in daytime but feels that she doesn't get enough sleep at night. Her naps take about two times a day, for 60 minutes. Usually on a sofa or ina a recliner .    Sleep medical history and family sleep  history:  The patient is a single child, there is no family history of a sleep disorder she does not remember having any difficulties in childhood related to sleep.  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 Widowed 17 years ago, her husband had a good retirement plan. She orked only part time since.  Children= daughters are 7147, 2743, son is 5440. Grandchildren 8 , great-grand children 4 . Smoking history- no tobacco use , ETOH - "2- 3 times a week, liquor a couple of drinks a month".  Caffeine use, no coffee, Soda a couple a day, iced tea- 2-3 glasses a day.   Review of Systems: Out of a complete 14 system review, the patient complains of only the following symptoms,  and all other reviewed systems are negative. Epworth score 12 , Fatigue severity score 57  , depression score 3/15    Social History   Social History  . Marital status: Widowed    Spouse name: N/A  . Number of children: N/A  . Years of education: N/A   Occupational History  . Not on file.   Social History Main Topics  . Smoking status: Never Smoker  . Smokeless tobacco: Never Used  . Alcohol use 8.4 oz/week    14 Cans of beer per week  . Drug use: No  . Sexual activity: Not on file   Other Topics Concern  . Not on file   Social History Narrative  . No narrative on file    Family History  Problem Relation Age of Onset  . Sickle cell anemia Father     Past Medical History:  Diagnosis Date  . Diabetes mellitus   . Hypercholesteremia   . Hypertension   . Sleep apnea     Past Surgical History:  Procedure Laterality Date  . TUBAL LIGATION      Current Outpatient Prescriptions  Medication Sig Dispense Refill  . amLODipine (NORVASC) 10 MG tablet Take 10 mg by mouth daily.     Marland Kitchen. atorvastatin (LIPITOR) 40 MG tablet Take 40 mg by mouth daily.    . diphenhydramine-acetaminophen (TYLENOL PM) 25-500 MG TABS Take 2 tablets by mouth every 4 (four) hours as needed (pain).    . hydrochlorothiazide (MICROZIDE) 12.5 MG capsule Take 1 capsule by mouth daily.    Marland Kitchen. HYDROcodone-acetaminophen (NORCO/VICODIN) 5-325 MG per tablet Take 2 tablets by mouth every 4 (four) hours as needed. 10 tablet 0  . ibuprofen (ADVIL,MOTRIN) 800 MG tablet Take 1 tablet (800 mg total) by mouth 3 (three) times daily. 21 tablet 0  . losartan (COZAAR) 25 MG tablet Take 25 mg by mouth daily.      . metFORMIN (GLUCOPHAGE-XR) 500 MG 24 hr tablet Take 1,000 mg by mouth daily with breakfast.     . metoprolol succinate (TOPROL-XL) 50 MG 24 hr tablet Take 1 tablet by mouth daily.    Marland Kitchen. omeprazole (PRILOSEC) 20 MG capsule Take 20 mg by mouth daily.    Marland Kitchen. PRESCRIPTION MEDICATION Place 1 drop into both eyes daily.  For dry eye and allergies.     No current facility-administered medications for this visit.     Allergies as of 02/08/2016 - Review Complete 02/08/2016  Allergen Reaction Noted  . Ace inhibitors  10/31/2011    Vitals: BP 118/78   Pulse 84   Ht 5\' 4"  (1.626 m)   Wt 188 lb (85.3 kg)   BMI 32.27 kg/m  Last Weight:  Wt Readings from Last 1 Encounters:  02/08/16  188 lb (85.3 kg)   WUJ:WJXB mass index is 32.27 kg/m.     Last Height:   Ht Readings from Last 1 Encounters:  02/08/16 5\' 4"  (1.626 m)    Physical exam:  General: The patient is awake, alert and appears not in acute distress. The patient is well groomed. Head: Normocephalic, atraumatic. Neck is supple. Mallampati 3,  neck circumference:15.5 . Nasal airflow congested , TMJ is not  evident . Retrognathia is seen.  Cardiovascular:  Regular rate and rhythm, without  murmurs or carotid bruit, and without distended neck veins. Respiratory: Lungs are clear to auscultation. Skin:  Withevidence of ankle edema,  Trunk: BMI is elevated . The patient's posture is erect.   Neurologic exam : The patient is awake and alert, oriented to place and time.  Memory subjective described as intact. Attention span & concentration ability appears normal. Speech is fluent,  without dysarthria, dysphonia or aphasia. Mood and affect are appropriate.  Cranial nerves: Pupils are equal and briskly reactive to light. Extraocular movements  in vertical and horizontal planes intact and without nystagmus. Visual fields by finger perimetry are intact. Hearing to finger rub intact. Facial sensation intact to fine touch. Facial motor strength is symmetric and tongue and uvula move midline. Shoulder shrug was symmetrical.  Motor exam:  Normal tone, muscle bulk and symmetric strength in all extremities. Sensory:  Fine touch, pinprick and vibration/ Proprioception  was normal. Coordination: Rapid alternating movements/  Finger-to-nose maneuver  normal without  evidence of ataxia, dysmetria or tremor. Gait and station: Patient walks without assistive device and is able unassisted to climb up to the exam table. Strength within normal limits. Stance is stable and normal.  Deep tendon reflexes: in the  upper and lower extremities are symmetric and intact.   The patient was advised of the nature of the diagnosed sleep disorder , the treatment options and risks for general a health and wellness arising from not treating the condition.  I spent more than 45 minutes of face to face time with the patient. Greater than 50% of time was spent in counseling and coordination of care. We have discussed the diagnosis and differential and I answered the patient's questions.     Assessment:  After physical and neurologic examination, review of laboratory studies,  Personal review of imaging studies, reports of other /same  Imaging studies ,  Results of polysomnography/ neurophysiology testing and pre-existing records as far as provided in visit., my assessment is   1)  Olivia Morales has certainly still all the risk factors for obstructive sleep apnea.  Her download is not longer valid because her machine has not had supplies for  maintenance and I think that the high residual AHI could be related to mask leaks.  What I need her to do is to return for a split-night polysomnography to get properly diagnosed and to have a new machine ordered for her.  The current machine cannot be remotely downloaded but I was curious why she was on a BiPAP and have not found an explanation for it. I would like to invite her for a split-night polysomnography but advise the technologist to split her after one hour if enough apnea is present.  This way we may be able to prove if CPAP is a possibility or not and change to BiPAP.  2) the patient should be using an interface of her choice and comfort she seems to be a mouth breather, and she often has nasal congestion. For this  reason she may  need to have a full face mask. She wakes up frequently with morning headaches so I would like capnography and oximetry to accompaniy this study.  3)obesity - BMI over 35, needs to lose weight.    HTN, DM, nocturia  may benefit form OSA treatment.     Plan:  Treatment plan and additional workup : Weight loss plan to develop with PCP, RV after SPLIT with capnography.    Porfirio Mylar Axle Parfait MD  02/08/2016   CC: Teena Irani, Pa-c [address] [place], [place] [address]

## 2016-02-16 ENCOUNTER — Institutional Professional Consult (permissible substitution): Payer: 59 | Admitting: Neurology

## 2016-02-20 ENCOUNTER — Telehealth: Payer: Self-pay | Admitting: Neurology

## 2016-02-20 NOTE — Telephone Encounter (Signed)
[employer] states not enough criteria to meet the requirements for Split sleep study.  HST is suggested.

## 2016-02-21 NOTE — Telephone Encounter (Signed)
I love their suggestions, she needs an attended study. We watch for central apnea in a patient on narcotics, can we relate that ?

## 2016-03-13 ENCOUNTER — Telehealth: Payer: Self-pay | Admitting: Neurology

## 2016-03-13 DIAGNOSIS — R0683 Snoring: Secondary | ICD-10-CM

## 2016-03-13 NOTE — Telephone Encounter (Signed)
Pt called said she has left several msg for Dawn to call her back reg a sleep study but she has not had a return call. She said she does not want to speak with anyone but Dr D.

## 2016-03-16 NOTE — Telephone Encounter (Signed)
split-night polysomnography but advise the technologist to split her after one hour if enough apnea is present. This way we may be able to prove if CPAP is a possibility or not and change to BiPAP.  2) the patient should be using an interface of her choice and comfort she seems to be a mouth breather, and she often has nasal congestion. For this reason she may need to have a full face mask. She wakes up frequently with morning headaches so I would like capnography and possible oxygen supplementation to accompanies this study.

## 2016-03-16 NOTE — Telephone Encounter (Signed)
This patient qualifies for attended sleep study based on clinical history.   

## 2016-03-19 NOTE — Addendum Note (Signed)
Addended by: Geronimo RunningINKINS, Susan Bleich A on: 03/19/2016 02:07 PM   Modules accepted: Orders

## 2016-03-19 NOTE — Telephone Encounter (Signed)
I spoke to Dr. Vickey Hugerohmeier. She is agreeable to an HST to test for baseline apnea, and then bring pt in for an attended study. Order placed.

## 2016-03-19 NOTE — Telephone Encounter (Signed)
Patient will need HST first to prove she still has sleep apnea. Then we can bring her in for cpap/bipap study. Can we get an HST order?

## 2016-03-22 ENCOUNTER — Encounter (INDEPENDENT_AMBULATORY_CARE_PROVIDER_SITE_OTHER): Payer: 59 | Admitting: Neurology

## 2016-03-22 DIAGNOSIS — G4733 Obstructive sleep apnea (adult) (pediatric): Secondary | ICD-10-CM | POA: Diagnosis not present

## 2016-03-22 DIAGNOSIS — R0683 Snoring: Secondary | ICD-10-CM

## 2016-03-27 ENCOUNTER — Telehealth: Payer: Self-pay

## 2016-03-27 DIAGNOSIS — G4733 Obstructive sleep apnea (adult) (pediatric): Secondary | ICD-10-CM

## 2016-03-27 NOTE — Telephone Encounter (Signed)
I spoke to pt regarding sleep study results. I advised her that her sleep study revealed moderately severe degree of sleep apnea with oxygen desaturations and that Dr. Vickey Hugerohmeier recommends a cpap titration study. Pt is agreeable to this. I advised her that our sleep lab will be working on getting the cpap titration approved, but it could take a few weeks. Pt verbalized understanding of results. Pt had no questions at this time but was encouraged to call back if questions arise.

## 2016-04-25 ENCOUNTER — Telehealth: Payer: Self-pay | Admitting: Neurology

## 2016-04-25 NOTE — Telephone Encounter (Signed)
[employer] denied CPAP and presented the opportunity to do a peer to peer within 14 days of 04/24/16.  Phone # for peer to peer  [phone]

## 2016-04-26 ENCOUNTER — Telehealth: Payer: Self-pay | Admitting: Neurology

## 2016-04-26 DIAGNOSIS — G4733 Obstructive sleep apnea (adult) (pediatric): Secondary | ICD-10-CM

## 2016-04-26 NOTE — Telephone Encounter (Signed)
[employer] refuses attended sleep CPAP titration with capnography.  I will order auto cpap . cd

## 2016-04-27 NOTE — Telephone Encounter (Signed)
[facility] .   reference number [id number] , today Friday 27th of October at 13;20 hours. Dr. Reva BoresJamie Yuntuni(?) .

## 2016-04-30 NOTE — Telephone Encounter (Signed)
I spoke to pt and advised her that her cpap titration was denied and that Dr. Vickey Hugerohmeier recommends auto pap. Pt is agreeable to this. I advised her that an order for cpap will be sent to [facility]. A follow up appt was made for 07/17/16 at 9:30am. Pt verbalized understanding.

## 2016-04-30 NOTE — Telephone Encounter (Signed)
Had to order HST , order is in. CD

## 2016-05-09 NOTE — Telephone Encounter (Signed)
Pt called in stating she has been denied coverage. A letter was received telling her about why. She would like a call back to discuss the appeal. Please call and advise [phone]

## 2016-05-09 NOTE — Telephone Encounter (Signed)
I called Olivia Morales. She got a letter in the mail saying that her cpap titraiton was denied and she wants to appeal it because she knows that she needs a cpap. I explained to her that we already knew her cpap titration was denied. However, Dr. Vickey Hugerohmeier has already opted to place her on a cpap, and [facility] should be calling to discuss if that got approved and when that can be set up. I gave her [facility]'s phone number and asked her to call them. I will also reach out to [facility] to find out what is going on. Olivia Morales verbalized understanding.

## 2016-07-17 ENCOUNTER — Telehealth: Payer: Self-pay | Admitting: Neurology

## 2016-07-17 ENCOUNTER — Telehealth: Payer: Self-pay

## 2016-07-17 ENCOUNTER — Ambulatory Visit: Payer: Self-pay | Admitting: Neurology

## 2016-07-17 NOTE — Telephone Encounter (Signed)
Non compliant with CPAP, less than 2 days over a period of 90 days ! She has rescheduled appointment 3 times,  now no showed for todays appointment .  May return to PCP, Olivia Morales.  CD

## 2016-07-17 NOTE — Telephone Encounter (Signed)
Pt did not show for their appt with Dr. Dohmeier today.  

## 2016-07-20 ENCOUNTER — Encounter: Payer: Self-pay | Admitting: Neurology

## 2017-03-05 DIAGNOSIS — Z6835 Body mass index (BMI) 35.0-35.9, adult: Secondary | ICD-10-CM

## 2017-09-30 ENCOUNTER — Other Ambulatory Visit: Payer: Self-pay | Admitting: Family Medicine

## 2017-09-30 DIAGNOSIS — E2839 Other primary ovarian failure: Secondary | ICD-10-CM

## 2017-10-23 ENCOUNTER — Inpatient Hospital Stay
Admission: RE | Admit: 2017-10-23 | Discharge: 2017-10-23 | Disposition: A | Discharge status: Home / Self Care | Payer: 59 | Source: Ambulatory Visit | Attending: Family Medicine | Admitting: Family Medicine

## 2017-11-21 ENCOUNTER — Other Ambulatory Visit: Payer: 59

## 2017-12-09 ENCOUNTER — Other Ambulatory Visit: Payer: Self-pay

## 2017-12-09 ENCOUNTER — Emergency Department (HOSPITAL_BASED_OUTPATIENT_CLINIC_OR_DEPARTMENT_OTHER): Payer: Medicare Other

## 2017-12-09 ENCOUNTER — Encounter (HOSPITAL_BASED_OUTPATIENT_CLINIC_OR_DEPARTMENT_OTHER): Payer: Self-pay | Admitting: Emergency Medicine

## 2017-12-09 ENCOUNTER — Emergency Department (HOSPITAL_BASED_OUTPATIENT_CLINIC_OR_DEPARTMENT_OTHER)
Admission: EM | Admit: 2017-12-09 | Discharge: 2017-12-09 | Disposition: A | Discharge status: Home / Self Care | Payer: Medicare Other | Attending: Emergency Medicine | Admitting: Emergency Medicine

## 2017-12-09 DIAGNOSIS — S82832A Other fracture of upper and lower end of left fibula, initial encounter for closed fracture: Secondary | ICD-10-CM

## 2017-12-09 DIAGNOSIS — E119 Type 2 diabetes mellitus without complications: Secondary | ICD-10-CM | POA: Diagnosis not present

## 2017-12-09 DIAGNOSIS — I10 Essential (primary) hypertension: Secondary | ICD-10-CM | POA: Insufficient documentation

## 2017-12-09 DIAGNOSIS — Y929 Unspecified place or not applicable: Secondary | ICD-10-CM | POA: Insufficient documentation

## 2017-12-09 DIAGNOSIS — W19XXXA Unspecified fall, initial encounter: Secondary | ICD-10-CM | POA: Diagnosis not present

## 2017-12-09 DIAGNOSIS — S8992XA Unspecified injury of left lower leg, initial encounter: Secondary | ICD-10-CM | POA: Diagnosis present

## 2017-12-09 DIAGNOSIS — Y939 Activity, unspecified: Secondary | ICD-10-CM | POA: Diagnosis not present

## 2017-12-09 DIAGNOSIS — Y998 Other external cause status: Secondary | ICD-10-CM | POA: Diagnosis not present

## 2017-12-09 LAB — CBC WITH DIFFERENTIAL/PLATELET
Basophils Absolute: 0 10*3/uL (ref 0.0–0.1)
Basophils Relative: 1 %
EOS PCT: 4 %
Eosinophils Absolute: 0.1 10*3/uL (ref 0.0–0.7)
HCT: 31.5 % — ABNORMAL LOW (ref 36.0–46.0)
Hemoglobin: 10.7 g/dL — ABNORMAL LOW (ref 12.0–15.0)
LYMPHS ABS: 1 10*3/uL (ref 0.7–4.0)
LYMPHS PCT: 28 %
MCH: 27.2 pg (ref 26.0–34.0)
MCHC: 34 g/dL (ref 30.0–36.0)
MCV: 80.2 fL (ref 78.0–100.0)
Monocytes Absolute: 0.3 10*3/uL (ref 0.1–1.0)
Monocytes Relative: 9 %
Neutro Abs: 2.1 10*3/uL (ref 1.7–7.7)
Neutrophils Relative %: 58 %
PLATELETS: 298 10*3/uL (ref 150–400)
RBC: 3.93 MIL/uL (ref 3.87–5.11)
RDW: 13.4 % (ref 11.5–15.5)
WBC: 3.5 10*3/uL — AB (ref 4.0–10.5)

## 2017-12-09 LAB — BASIC METABOLIC PANEL
Anion gap: 9 (ref 5–15)
BUN: 23 mg/dL — AB (ref 6–20)
CALCIUM: 9.5 mg/dL (ref 8.9–10.3)
CO2: 28 mmol/L (ref 22–32)
Chloride: 102 mmol/L (ref 101–111)
Creatinine, Ser: 1.34 mg/dL — ABNORMAL HIGH (ref 0.44–1.00)
GFR calc Af Amer: 47 mL/min — ABNORMAL LOW (ref >60)
GFR calc non Af Amer: 41 mL/min — ABNORMAL LOW (ref >60)
GLUCOSE: 194 mg/dL — AB (ref 65–99)
POTASSIUM: 3.7 mmol/L (ref 3.5–5.1)
SODIUM: 139 mmol/L (ref 135–145)

## 2017-12-09 MED ORDER — OXYCODONE-ACETAMINOPHEN 5-325 MG PO TABS: 1.0000 | ORAL_TABLET | Freq: Four times a day (QID) | ORAL | 0 refills | Status: DC | PRN

## 2017-12-09 MED ORDER — OXYCODONE-ACETAMINOPHEN 5-325 MG PO TABS
1.0000 | ORAL_TABLET | Freq: Once | ORAL | Status: AC
  Administered 2017-12-09: 1 via ORAL
  Filled 2017-12-09: qty 1

## 2017-12-09 MED FILL — OXYCODONE-ACETAMINOPHEN 5-3: 5-325 | 3 days supply | Qty: 20 | Fill #0

## 2017-12-09 NOTE — ED Triage Notes (Signed)
Pt states she fell today, injured left lower leg.  Painful to move, unable bear weight.

## 2017-12-09 NOTE — ED Notes (Signed)
Patient transported to X-ray 

## 2017-12-09 NOTE — ED Provider Notes (Signed)
Emergency Department Provider Note   I have reviewed the triage vital signs and the nursing notes.   HISTORY  Chief Complaint Fall   HPI Olivia Morales is a 67 y.o. female with medical problems as documented below the presents to the emergency department today secondary to an unwitnessed fall.  Patient does not remember how she fell but just knows that she heard a pop and feels like her ankle was twisted in an abnormal way.  She subsequently had severe pain in her left ankle left hand left proximal tibia area as well.  Unable to ambulate onto her left ankle secondary to the pain.  Sure she did not hit her head.  She did fall a few days ago while in the bathtub standing on a step stool.  No injuries from that fall that she recognizes. No other associated or modifying symptoms.    Past Medical History:  Diagnosis Date  . Diabetes mellitus   . Hypercholesteremia   . Hypertension   . Sleep apnea     Patient Active Problem List   Diagnosis Date Noted  . 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 Nocturia more than twice per night 02/08/2016  . Noncompliance with CPAP treatment 02/08/2016    Past Surgical History:  Procedure Laterality Date  . TUBAL LIGATION      Current Outpatient Rx  . Order #: [id number] Class: Historical Med  . Order #: [id number] Class: Historical Med  . Order #: [id number] Class: Historical Med  . Order #: [id number] Class: Historical Med  . Order #: [id number] Class: Print  . Order #: [id number] Class: Print  . Order #: [id number] Class: Historical Med  . Order #: [id number] Class: Historical Med  . Order #: [id number] Class: Historical Med  . Order #: [id number] Class: Historical Med  . Order #: [id number] Class: Print  . Order #: [id number] Class: Historical Med    Allergies Ace inhibitors  Family History  Problem Relation Age of Onset  . Sickle cell anemia Father     Social History Social History   Tobacco Use  . Smoking status: Never Smoker  . Smokeless  tobacco: Never Used  Substance Use Topics  . Alcohol use: Yes    Alcohol/week: 8.4 oz    Types: 14 Cans of beer per week  . Drug use: No    Review of Systems  All other systems negative except as documented in the HPI. All pertinent positives and negatives as reviewed in the HPI. ____________________________________________   PHYSICAL EXAM:  VITAL SIGNS: ED Triage Vitals  Enc Vitals Group     BP 12/09/17 0918 (!) 149/87     Pulse Rate 12/09/17 0918 (!) 54     Resp 12/09/17 0918 18     Temp 12/09/17 0918 98.7 F (37.1 C)     Temp Source 12/09/17 0918 Oral     SpO2 12/09/17 0918 100 %     Weight 12/09/17 0918 190 lb (86.2 kg)     Height 12/09/17 0918 5\' 4"  (1.626 m)    Constitutional: Alert and oriented. Well appearing and in no acute distress. Eyes: Conjunctivae are normal. PERRL. EOMI. Head: Atraumatic. Nose: No congestion/rhinnorhea. Mouth/Throat: Mucous membranes are moist.  Oropharynx non-erythematous. Neck: No stridor.  No meningeal signs.   Cardiovascular: Normal rate, regular rhythm. Good peripheral circulation. Grossly normal heart sounds.   Respiratory: Normal respiratory effort.  No retractions. Lungs CTAB. Gastrointestinal: Soft and nontender. No distention.  Musculoskeletal: LLE: proximal fibular ttp, ankle swollen with pain on ROM. Pulse present.  Neurologic:  Normal  speech and language. No gross focal neurologic deficits are appreciated.  Skin:  Skin is warm, dry and intact. No rash noted.   ____________________________________________   LABS (all labs ordered are listed, but only abnormal results are displayed)  Labs Reviewed  CBC WITH DIFFERENTIAL/PLATELET - Abnormal; Notable for the following components:      Result Value   WBC 3.5 (*)    Hemoglobin 10.7 (*)    HCT 31.5 (*)    All other components within normal limits  BASIC METABOLIC PANEL - Abnormal; Notable for the following components:   Glucose, Bld 194 (*)    BUN 23 (*)    Creatinine,  Ser 1.34 (*)    GFR calc non Af Amer 41 (*)    GFR calc Af Amer 47 (*)    All other components within normal limits   ____________________________________________  EKG   EKG Interpretation  Date/Time:  Monday December 09 2017 10:20:25 EDT Ventricular Rate:  58 PR Interval:    QRS Duration: 137 QT Interval:  468 QTC Calculation: 460 R Axis:   -67 Text Interpretation:  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 Left leg injury and pain. Initial encounter. EXAM: LEFT TIBIA AND FIBULA - 2 VIEW COMPARISON:  None. FINDINGS: An oblique fracture of the distal fibular diaphysis is seen which shows minimal displacement. No other fractures or bone lesions identified. IMPRESSION: Oblique fracture of distal fibular diaphysis, with minimal displacement. Electronically Signed   By: Myles Rosenthal M.D.   On: 12/09/2017 10:17   Dg Ankle Complete Left  Result Date: 12/09/2017 CLINICAL DATA:  Fall, left lower leg injury. EXAM: LEFT ANKLE COMPLETE - 3+ VIEW COMPARISON:  None. FINDINGS: There is an oblique fracture through the distal fibular shaft with minimal displacement. No visible distal tibial abnormality. Ankle mortise is intact. IMPRESSION: Minimally displaced distal fibular shaft fracture. Electronically Signed   By: Charlett Nose M.D.   On: 12/09/2017 10:11   Dg Hand Complete Left  Result Date: 12/09/2017 CLINICAL DATA:  Fall today. Left hand injury and pain. Initial encounter. EXAM: LEFT HAND - COMPLETE 3+ VIEW COMPARISON:  None. FINDINGS: There is no evidence of fracture or dislocation. There is no evidence of arthropathy or other focal bone abnormality. Soft tissues are unremarkable. IMPRESSION: Negative. Electronically Signed   By: Myles Rosenthal M.D.   On: 12/09/2017 10:14   Dg Foot Complete  Left  Result Date: 12/09/2017 CLINICAL DATA:  Fall.  Unable to bear weight.  Left lower leg pain. EXAM: LEFT FOOT - COMPLETE 3+ VIEW COMPARISON:  None. FINDINGS: Oblique fracture noted through the distal fibular shaft. No acute bony abnormality within the left foot. Joint spaces are maintained. IMPRESSION: Oblique fracture through the distal left fibular shaft. Electronically Signed   By: Charlett Nose M.D.   On: 12/09/2017 10:12    ____________________________________________   PROCEDURES  Procedure(s) performed:   Procedures   ____________________________________________   INITIAL IMPRESSION / ASSESSMENT AND PLAN / ED COURSE  Labs/ecg for unwitnessed fall. xr of affected body parts otherwise.   Fibula fracture. Cam boot placed. Ambulates. Pain med rx. Referral to non-op ortho.  New bbb but not likely related to fall. Labs ok. Stable for discharge.      Pertinent labs & imaging results that were available during my  care of the patient were reviewed by me and considered in my medical decision making (see chart for details).  ____________________________________________  FINAL CLINICAL IMPRESSION(S) / ED DIAGNOSES  Final diagnoses:  Closed fracture of distal end of left fibula, unspecified fracture morphology, initial encounter     MEDICATIONS GIVEN DURING THIS VISIT:  Medications  oxyCODONE-acetaminophen (PERCOCET/ROXICET) 5-325 MG per tablet 1 tablet (1 tablet Oral Given 12/09/17 1016)     NEW OUTPATIENT MEDICATIONS STARTED DURING THIS VISIT:  Discharge Medication List as of 12/09/2017 11:48 AM    START taking these medications   Details  oxyCODONE-acetaminophen (PERCOCET) 5-325 MG tablet Take 1-2 tablets by mouth every 6 (six) hours as needed for severe pain., Starting Mon 12/09/2017, Print        Note:  This note was prepared with assistance of Dragon voice recognition software. Occasional wrong-word or sound-a-like substitutions may have occurred due to the  inherent limitations of voice recognition software.   Ikram Riebe, Barbara Cower, MD 12/10/17 [phone]

## 2017-12-09 NOTE — Discharge Instructions (Signed)
You have a fracture of the fibula on your left ankle. This is the outer bone of your leg.

## 2017-12-13 DIAGNOSIS — Z91199 Patient's noncompliance with other medical treatment and regimen due to unspecified reason: Secondary | ICD-10-CM | POA: Insufficient documentation

## 2017-12-17 ENCOUNTER — Ambulatory Visit: Payer: Medicare Other | Admitting: Family Medicine

## 2017-12-17 ENCOUNTER — Encounter: Payer: Self-pay | Admitting: Family Medicine

## 2017-12-17 DIAGNOSIS — S99912A Unspecified injury of left ankle, initial encounter: Secondary | ICD-10-CM | POA: Diagnosis not present

## 2017-12-17 MED ORDER — OXYCODONE-ACETAMINOPHEN 5-325 MG PO TABS: 1.0000 | ORAL_TABLET | ORAL | 0 refills | Status: DC | PRN

## 2017-12-17 MED FILL — OXYCODONE-ACETAMINOPHEN 5-3: 5-325 | 5 days supply | Qty: 30 | Fill #0

## 2017-12-17 NOTE — Patient Instructions (Signed)
You have a distal fibula fracture. Wear boot when up and around. Try not to put any weight on this leg. Ok to take boot off to wash, ice the area, when sleeping, to do motion exercises. Icing 15 minutes at a time 3-4 times a day. Elevate above your heart as much as possible for swelling. Come out of boot twice a day to do simple up/down exercises of ankle. Ibuprofen 600mg  three times a day with food OR aleve 2 tabs twice a day with food for pain and inflammation. Percocet as needed for severe pain. Follow up with me in 2 weeks for reevaluation.

## 2017-12-19 ENCOUNTER — Encounter: Payer: Self-pay | Admitting: Family Medicine

## 2017-12-19 DIAGNOSIS — S99912D Unspecified injury of left ankle, subsequent encounter: Secondary | ICD-10-CM | POA: Insufficient documentation

## 2017-12-19 NOTE — Progress Notes (Signed)
PCP: Devra DoppHowell, Tamieka, MD  Subjective:   HPI: Patient is a 67 y.o. female here for left ankle injury.  Patient reports on 6/10 she accidentally fell and believes she inverted her left ankle. Immediate pain and swelling primarily lateral left ankle. Pain continues to be 5/10 with up to 10/10 level sharp pain at times. She is wearing cam walker and tolerating this. Interested in using a knee scooter. No prior injuries to this ankle. No other skin changes, numbness  Past Medical History:  Diagnosis Date  . Diabetes mellitus   . Hypercholesteremia   . Hypertension   . Sleep apnea     Current Outpatient Medications on File Prior to Visit  Medication Sig Dispense Refill  . Dulaglutide (TRULICITY) 0.75 MG/0.5ML SOPN Inject into the skin.    Marland Kitchen. glipiZIDE (GLUCOTROL XL) 10 MG 24 hr tablet TAKE 1 TABLET BY MOUTH  DAILY    . amLODipine (NORVASC) 10 MG tablet Take 10 mg by mouth daily.     Marland Kitchen. atorvastatin (LIPITOR) 40 MG tablet Take 40 mg by mouth daily.    . diphenhydramine-acetaminophen (TYLENOL PM) 25-500 MG TABS Take 2 tablets by mouth every 4 (four) hours as needed (pain).    . hydrochlorothiazide (MICROZIDE) 12.5 MG capsule Take 1 capsule by mouth daily.    Marland Kitchen. losartan (COZAAR) 25 MG tablet Take 25 mg by mouth daily.      . metFORMIN (GLUCOPHAGE-XR) 500 MG 24 hr tablet Take 1,000 mg by mouth daily with breakfast.     . metoprolol succinate (TOPROL-XL) 50 MG 24 hr tablet Take 1 tablet by mouth daily.    Marland Kitchen. omeprazole (PRILOSEC) 20 MG capsule Take 20 mg by mouth daily.    Marland Kitchen. PRESCRIPTION MEDICATION Place 1 drop into both eyes daily. For dry eye and allergies.     No current facility-administered medications on file prior to visit.     Past Surgical History:  Procedure Laterality Date  . TUBAL LIGATION      Allergies  Allergen Reactions  . Ace Inhibitors     Cough    Social History   Socioeconomic History  . Marital status: Widowed    Spouse name: Not on file  . Number of  children: Not on file  . Years of education: Not on file  . Highest education level: Not on file  Occupational History  . Not on file  Social Needs  . Financial resource strain: Not on file  . Food insecurity:    Worry: Not on file    Inability: Not on file  . Transportation needs:    Medical: Not on file    Non-medical: Not on file  Tobacco Use  . Smoking status: Never Smoker  . Smokeless tobacco: Never Used  Substance and Sexual Activity  . Alcohol use: Yes    Alcohol/week: 8.4 oz    Types: 14 Cans of beer per week  . Drug use: No  . Sexual activity: Not on file  Lifestyle  . Physical activity:    Days per week: Not on file    Minutes per session: Not on file  . Stress: Not on file  Relationships  . Social connections:    Talks on phone: Not on file    Gets together: Not on file    Attends religious service: Not on file    Active member of club or organization: Not on file    Attends meetings of clubs or organizations: Not on file    Relationship  status: Not on file  . Intimate partner violence:    Fear of current or ex partner: Not on file    Emotionally abused: Not on file    Physically abused: Not on file    Forced sexual activity: Not on file  Other Topics Concern  . Not on file  Social History Narrative  . Not on file    Family History  Problem Relation Age of Onset  . Sickle cell anemia Father     BP (!) 144/77   Pulse (!) 56   Ht 5\' 4"  (1.626 m)   Wt 188 lb (85.3 kg)   BMI 32.27 kg/m   Review of Systems: See HPI above.     Objective:  Physical Exam:  Gen: NAD, comfortable in exam room  Left ankle: Mod lateral swelling.  Mild discoloration and bruising.  No other deformity. Mod limitation ROM all directions.  Strength not tested with known fracture. TTP distal fibula.  No deltoid ligament, other tenderness of foot/ankle. Negative ant drawer and talar tilt.   Thompsons test negative. NV intact distally.  Right ankle: No  deformity. FROM with 5/5 strength. No tenderness to palpation. NVI distally.   MSK u/s left ankle:  Cortical irregularity seen distal fibula consistent with oblique fracture without callus formation or displacement.  Assessment & Plan:  1. Left ankle injury - Independently reviewed radiographs and noted distal fibula oblique fracture without displacement.  Mortise is intact and no medial tenderness to warrant concern for maisonneuve fracture.  Today's ultrasound without displacement but no early healing seen yet.  Continue cam walker.  Knee scooter, advised not to bear weight.  Icing, elevation.  Ibuprofen or aleve with percocet as needed.  F/u in 2 weeks.

## 2017-12-19 NOTE — Assessment & Plan Note (Signed)
Independently reviewed radiographs and noted distal fibula oblique fracture without displacement.  Mortise is intact and no medial tenderness to warrant concern for maisonneuve fracture.  Today's ultrasound without displacement but no early healing seen yet.  Continue cam walker.  Knee scooter, advised not to bear weight.  Icing, elevation.  Ibuprofen or aleve with percocet as needed.  F/u in 2 weeks.

## 2018-01-03 ENCOUNTER — Ambulatory Visit (HOSPITAL_BASED_OUTPATIENT_CLINIC_OR_DEPARTMENT_OTHER)
Admission: RE | Admit: 2018-01-03 | Discharge: 2018-01-03 | Disposition: A | Discharge status: Home / Self Care | Payer: Medicare Other | Source: Ambulatory Visit | Attending: Family Medicine | Admitting: Family Medicine

## 2018-01-03 ENCOUNTER — Encounter: Payer: Self-pay | Admitting: Family Medicine

## 2018-01-03 ENCOUNTER — Ambulatory Visit: Payer: Medicare Other | Admitting: Family Medicine

## 2018-01-03 VITALS — BP 123/75 | HR 60 | Ht 64.0 in | Wt 188.0 lb

## 2018-01-03 DIAGNOSIS — S82402D Unspecified fracture of shaft of left fibula, subsequent encounter for closed fracture with routine healing: Secondary | ICD-10-CM | POA: Diagnosis not present

## 2018-01-03 DIAGNOSIS — S99912D Unspecified injury of left ankle, subsequent encounter: Secondary | ICD-10-CM | POA: Diagnosis present

## 2018-01-03 MED ORDER — OXYCODONE-ACETAMINOPHEN 5-325 MG PO TABS: 1.0000 | ORAL_TABLET | Freq: Four times a day (QID) | ORAL | 0 refills | Status: DC | PRN

## 2018-01-03 MED FILL — OXYCODONE-ACETAMINOPHEN 5-3: 5-325 | 5 days supply | Qty: 20 | Fill #0

## 2018-01-03 NOTE — Progress Notes (Signed)
PCP: Devra Dopp, MD  Subjective:   HPI: Patient is a 67 y.o. female here for left ankle injury.  6/18: Patient reports on 6/10 she accidentally fell and believes she inverted her left ankle. Immediate pain and swelling primarily lateral left ankle. Pain continues to be 5/10 with up to 10/10 level sharp pain at times. She is wearing cam walker and tolerating this. Interested in using a knee scooter. No prior injuries to this ankle. No other skin changes, numbness  7/5: Patient reports she's doing well. Takes ibuprofen during day with oxycodone in evenings if needed. Pain currently is 0/10 in the boot. Wearing cam walker when ambulating. Using a cane. Unfortunately her insurance denied knee scooter - she's doing to look into this and try other medical supply stores. + swelling but no skin changes.  Past Medical History:  Diagnosis Date  . Diabetes mellitus   . Hypercholesteremia   . Hypertension   . Sleep apnea     Current Outpatient Medications on File Prior to Visit  Medication Sig Dispense Refill  . amLODipine (NORVASC) 10 MG tablet Take 10 mg by mouth daily.     Marland Kitchen atorvastatin (LIPITOR) 40 MG tablet Take 40 mg by mouth daily.    . diphenhydramine-acetaminophen (TYLENOL PM) 25-500 MG TABS Take 2 tablets by mouth every 4 (four) hours as needed (pain).    . Dulaglutide (TRULICITY) 0.75 MG/0.5ML SOPN Inject into the skin.    Marland Kitchen glipiZIDE (GLUCOTROL XL) 10 MG 24 hr tablet TAKE 1 TABLET BY MOUTH  DAILY    . hydrochlorothiazide (MICROZIDE) 12.5 MG capsule Take 1 capsule by mouth daily.    Marland Kitchen losartan (COZAAR) 25 MG tablet Take 25 mg by mouth daily.      . metFORMIN (GLUCOPHAGE-XR) 500 MG 24 hr tablet Take 1,000 mg by mouth daily with breakfast.     . metoprolol succinate (TOPROL-XL) 50 MG 24 hr tablet Take 1 tablet by mouth daily.    Marland Kitchen omeprazole (PRILOSEC) 20 MG capsule Take 20 mg by mouth daily.    Marland Kitchen oxyCODONE-acetaminophen (PERCOCET/ROXICET) 5-325 MG tablet Take 1 tablet  by mouth every 4 (four) hours as needed for severe pain. 30 tablet 0  . PRESCRIPTION MEDICATION Place 1 drop into both eyes daily. For dry eye and allergies.     No current facility-administered medications on file prior to visit.     Past Surgical History:  Procedure Laterality Date  . TUBAL LIGATION      Allergies  Allergen Reactions  . Ace Inhibitors     Cough    Social History   Socioeconomic History  . Marital status: Widowed    Spouse name: Not on file  . Number of children: Not on file  . Years of education: Not on file  . Highest education level: Not on file  Occupational History  . Not on file  Social Needs  . Financial resource strain: Not on file  . Food insecurity:    Worry: Not on file    Inability: Not on file  . Transportation needs:    Medical: Not on file    Non-medical: Not on file  Tobacco Use  . Smoking status: Never Smoker  . Smokeless tobacco: Never Used  Substance and Sexual Activity  . Alcohol use: Yes    Alcohol/week: 8.4 oz    Types: 14 Cans of beer per week  . Drug use: No  . Sexual activity: Not on file  Lifestyle  . Physical activity:  Days per week: Not on file    Minutes per session: Not on file  . Stress: Not on file  Relationships  . Social connections:    Talks on phone: Not on file    Gets together: Not on file    Attends religious service: Not on file    Active member of club or organization: Not on file    Attends meetings of clubs or organizations: Not on file    Relationship status: Not on file  . Intimate partner violence:    Fear of current or ex partner: Not on file    Emotionally abused: Not on file    Physically abused: Not on file    Forced sexual activity: Not on file  Other Topics Concern  . Not on file  Social History Narrative  . Not on file    Family History  Problem Relation Age of Onset  . Sickle cell anemia Father     BP 123/75   Pulse 60   Ht 5\' 4"  (1.626 m)   Wt 188 lb (85.3 kg)   BMI  32.27 kg/m   Review of Systems: See HPI above.     Objective:  Physical Exam:  Gen: NAD, comfortable in exam room  Left ankle: Mod swelling circumferentially.  No bruising, other deformity. Mod limitation ROM all directions.  Strength 5/5 each direction but limited. TTP distal fibula, ankle joint, medially.  No other tenderness. Negative ant drawer, talar tilt. Thompsons negative. NVI distally.  Assessment & Plan:  1. Left ankle injury - independently reviewed radiographs and noted interval healing of her distal fibula fracture.  Mortise intact.  Continue with cam walker and f/u in 2-2.5 weeks.  Cane, walker to help with ambulation.  Ibuprofen with oxycodone as needed.  Motion exercises twice a day.

## 2018-01-03 NOTE — Assessment & Plan Note (Signed)
independently reviewed radiographs and noted interval healing of her distal fibula fracture.  Mortise intact.  Continue with cam walker and f/u in 2-2.5 weeks.  Cane, walker to help with ambulation.  Ibuprofen with oxycodone as needed.  Motion exercises twice a day.

## 2018-01-03 NOTE — Patient Instructions (Signed)
Your x-rays look great! Continue wearing the boot. Cane or walker if needed - it's up to you if you want to get the scooter but given the amount of healing on the x-rays I don't think you need this now. Ibuprofen 600mg  three times a day with food as needed for pain and inflammation. Oxycodone as needed for severe pain. Follow up with me in 2 - 2.5 weeks for reevaluation. Motion exercises and alphabet exercises of your ankle twice a day out of the boot.

## 2018-01-21 ENCOUNTER — Ambulatory Visit: Payer: Medicare Other | Admitting: Family Medicine

## 2018-01-22 ENCOUNTER — Encounter: Payer: Self-pay | Admitting: Family Medicine

## 2018-01-22 ENCOUNTER — Ambulatory Visit (INDEPENDENT_AMBULATORY_CARE_PROVIDER_SITE_OTHER): Payer: Medicare Other | Admitting: Family Medicine

## 2018-01-22 VITALS — BP 126/82 | Ht 64.0 in | Wt 180.0 lb

## 2018-01-22 DIAGNOSIS — S99912D Unspecified injury of left ankle, subsequent encounter: Secondary | ICD-10-CM | POA: Diagnosis not present

## 2018-01-22 NOTE — Assessment & Plan Note (Signed)
2/2 distal fibula fracture.  Overall patient is improving -We will advance today with CAM Walker only as needed for comfort with weightbearing/ambulation  -May continue to use cane or walker to assist with ambulation  -Continue ibuprofen +/- oxycodone as needed -Continue range of motion exercises.  Patient was started on strengthening home exercise program and provided with a yellow Thera-Band.  - f/u 4-6 weeks

## 2018-01-22 NOTE — Patient Instructions (Signed)
Wear your boot as needed for comfort.  Right now it is most important to wear it if you are going to do more walking or walking on uneven ground.  Continue to do the ankle exercises for range of motion (the alphabet exercises) as you have been doing.  The strengthening exercises that you were shown today with the yellow Thera-Band should be done daily.  Do 3 sets of 10 repetitions.  I will have you follow-up in the sports medicine clinic in 4 weeks.

## 2018-01-22 NOTE — Progress Notes (Signed)
PCP: Devra Dopp, MD  Subjective:   HPI: Patient is a 67 y.o. female here for left ankle injury.  6/18: Patient reports on 6/10 she accidentally fell and believes she inverted her left ankle. Immediate pain and swelling primarily lateral left ankle. Pain continues to be 5/10 with up to 10/10 level sharp pain at times. She is wearing cam walker and tolerating this. Interested in using a knee scooter. No prior injuries to this ankle. No other skin changes, numbness  7/5: Patient reports she's doing well. Takes ibuprofen during day with oxycodone in evenings if needed. Pain currently is 0/10 in the boot. Wearing cam walker when ambulating. Using a cane. Unfortunately her insurance denied knee scooter - she's doing to look into this and try other medical supply stores. + swelling but no skin changes.  7/24: Patient continues to do well.  She reports being pain-free the majority the time.  However, she occasionally gets sharp lateral left ankle pain with no identified provoking factors.  She continues to wear her cam walker the majority of the day.  She removes it when sitting, sleeping and also while standing and washing the dishes.  She continues to use a cane for assistance with ambulation.  She occasionally takes Motrin which has been moderately helpful.  She reports continued swelling which is worse at the end of day.  No other skin changes.  No paresthesias.  No erythema or bruising.  Past Medical History:  Diagnosis Date  . Diabetes mellitus   . Hypercholesteremia   . Hypertension   . Sleep apnea     Current Outpatient Medications on File Prior to Visit  Medication Sig Dispense Refill  . amLODipine (NORVASC) 10 MG tablet Take 10 mg by mouth daily.     Marland Kitchen atorvastatin (LIPITOR) 40 MG tablet Take 40 mg by mouth daily.    . diphenhydramine-acetaminophen (TYLENOL PM) 25-500 MG TABS Take 2 tablets by mouth every 4 (four) hours as needed (pain).    . Dulaglutide (TRULICITY)  0.75 MG/0.5ML SOPN Inject into the skin.    Marland Kitchen glipiZIDE (GLUCOTROL XL) 10 MG 24 hr tablet TAKE 1 TABLET BY MOUTH  DAILY    . hydrochlorothiazide (MICROZIDE) 12.5 MG capsule Take 1 capsule by mouth daily.    Marland Kitchen losartan (COZAAR) 25 MG tablet Take 25 mg by mouth daily.      . metFORMIN (GLUCOPHAGE-XR) 500 MG 24 hr tablet Take 1,000 mg by mouth daily with breakfast.     . metoprolol succinate (TOPROL-XL) 50 MG 24 hr tablet Take 1 tablet by mouth daily.    Marland Kitchen omeprazole (PRILOSEC) 20 MG capsule Take 20 mg by mouth daily.    Marland Kitchen oxyCODONE-acetaminophen (PERCOCET/ROXICET) 5-325 MG tablet Take 1 tablet by mouth every 6 (six) hours as needed for severe pain. 20 tablet 0  . PRESCRIPTION MEDICATION Place 1 drop into both eyes daily. For dry eye and allergies.     No current facility-administered medications on file prior to visit.     Past Surgical History:  Procedure Laterality Date  . TUBAL LIGATION      Allergies  Allergen Reactions  . Ace Inhibitors     Cough    Social History   Socioeconomic History  . Marital status: Widowed    Spouse name: Not on file  . Number of children: Not on file  . Years of education: Not on file  . Highest education level: Not on file  Occupational History  . Not on file  Social  Needs  . Financial resource strain: Not on file  . Food insecurity:    Worry: Not on file    Inability: Not on file  . Transportation needs:    Medical: Not on file    Non-medical: Not on file  Tobacco Use  . Smoking status: Never Smoker  . Smokeless tobacco: Never Used  Substance and Sexual Activity  . Alcohol use: Yes    Alcohol/week: 8.4 oz    Types: 14 Cans of beer per week  . Drug use: No  . Sexual activity: Not on file  Lifestyle  . Physical activity:    Days per week: Not on file    Minutes per session: Not on file  . Stress: Not on file  Relationships  . Social connections:    Talks on phone: Not on file    Gets together: Not on file    Attends religious  service: Not on file    Active member of club or organization: Not on file    Attends meetings of clubs or organizations: Not on file    Relationship status: Not on file  . Intimate partner violence:    Fear of current or ex partner: Not on file    Emotionally abused: Not on file    Physically abused: Not on file    Forced sexual activity: Not on file  Other Topics Concern  . Not on file  Social History Narrative  . Not on file    Family History  Problem Relation Age of Onset  . Sickle cell anemia Father     BP 126/82   Ht 5\' 4"  (1.626 m)   Wt 180 lb (81.6 kg)   BMI 30.90 kg/m   Review of Systems: See HPI above.     Objective:  Physical Exam:  Gen: AAO x4, NAD, patient appears comfortable  Left ankle: Inspection: Mild circumferential swelling.  No bruising, visible bony deformity, or erythema Palpation: Tenderness over distal fibula and lateral malleolus.  Very mild medial joint tenderness ROM: Decreased range of motion with dorsiflexion compared to the right ankle otherwise symmetric range of motion bilaterally Strength: 5/5 in dorsiflexion, plantarflexion, inversion and eversion. Neurovascular: N/V intact distally Special tests: Negative anterior drawer     Assessment & Plan:  1. Left ankle injury -2/2 distal fibula fracture.  Overall patient is improving -We will advance today with CAM Walker only as needed for comfort with weightbearing/ambulation  -May continue to use cane or walker to assist with ambulation  -Continue ibuprofen +/- oxycodone as needed -Continue range of motion exercises.  Patient was started on strengthening home exercise program and provided with a yellow Thera-Band.  - f/u 4-6 weeks

## 2018-01-23 ENCOUNTER — Ambulatory Visit: Payer: Medicare Other | Admitting: Family Medicine

## 2018-02-18 ENCOUNTER — Encounter: Payer: Self-pay | Admitting: Family Medicine

## 2018-02-18 ENCOUNTER — Ambulatory Visit (INDEPENDENT_AMBULATORY_CARE_PROVIDER_SITE_OTHER): Payer: Medicare Other | Admitting: Family Medicine

## 2018-02-18 VITALS — BP 120/85 | HR 61 | Ht 64.0 in | Wt 180.0 lb

## 2018-02-18 DIAGNOSIS — S99912D Unspecified injury of left ankle, subsequent encounter: Secondary | ICD-10-CM

## 2018-02-18 NOTE — Progress Notes (Signed)
PCP: Devra DoppHowell, Tamieka, MD  Subjective:   HPI: Patient is a 67 y.o. female here for left ankle injury.  6/18: Patient reports on 6/10 she accidentally fell and believes she inverted her left ankle. Immediate pain and swelling primarily lateral left ankle. Pain continues to be 5/10 with up to 10/10 level sharp pain at times. She is wearing cam walker and tolerating this. Interested in using a knee scooter. No prior injuries to this ankle. No other skin changes, numbness  7/5: Patient reports she's doing well. Takes ibuprofen during day with oxycodone in evenings if needed. Pain currently is 0/10 in the boot. Wearing cam walker when ambulating. Using a cane. Unfortunately her insurance denied knee scooter - she's doing to look into this and try other medical supply stores. + swelling but no skin changes.  7/24: Patient continues to do well.  She reports being pain-free the majority the time.  However, she occasionally gets sharp lateral left ankle pain with no identified provoking factors.  She continues to wear her cam walker the majority of the day.  She removes it when sitting, sleeping and also while standing and washing the dishes.  She continues to use a cane for assistance with ambulation.  She occasionally takes Motrin which has been moderately helpful.  She reports continued swelling which is worse at the end of day.  No other skin changes.  No paresthesias.  No erythema or bruising.  8/20: Patient reports overall she is doing well notes this morning she did wake up with fairly severe pain on the dorsal aspect of her ankle and foot in the plantar foot. She states she had to hop to the bathroom because of the pain. She was more active on Sunday at church and noticed that her left ankle and foot was very swollen.   She is using the boot more intermittently now. Pain is up to 8 out of 10 and sharp. She has been taking ibuprofen or Tylenol if needed. No skin changes, new  injuries.  Past Medical History:  Diagnosis Date  . Diabetes mellitus   . Hypercholesteremia   . Hypertension   . Sleep apnea     Current Outpatient Medications on File Prior to Visit  Medication Sig Dispense Refill  . amLODipine (NORVASC) 10 MG tablet Take 10 mg by mouth daily.     Marland Kitchen. atorvastatin (LIPITOR) 40 MG tablet Take 40 mg by mouth daily.    . diphenhydramine-acetaminophen (TYLENOL PM) 25-500 MG TABS Take 2 tablets by mouth every 4 (four) hours as needed (pain).    . Dulaglutide (TRULICITY) 0.75 MG/0.5ML SOPN Inject into the skin.    Marland Kitchen. glipiZIDE (GLUCOTROL XL) 10 MG 24 hr tablet TAKE 1 TABLET BY MOUTH  DAILY    . hydrochlorothiazide (MICROZIDE) 12.5 MG capsule Take 1 capsule by mouth daily.    Marland Kitchen. losartan (COZAAR) 25 MG tablet Take 25 mg by mouth daily.      . metFORMIN (GLUCOPHAGE-XR) 500 MG 24 hr tablet Take 1,000 mg by mouth daily with breakfast.     . metoprolol succinate (TOPROL-XL) 50 MG 24 hr tablet Take 1 tablet by mouth daily.    Marland Kitchen. omeprazole (PRILOSEC) 20 MG capsule Take 20 mg by mouth daily.    Marland Kitchen. PRESCRIPTION MEDICATION Place 1 drop into both eyes daily. For dry eye and allergies.     No current facility-administered medications on file prior to visit.     Past Surgical History:  Procedure Laterality Date  . TUBAL  LIGATION      Allergies  Allergen Reactions  . Ace Inhibitors     Cough    Social History   Socioeconomic History  . Marital status: Widowed    Spouse name: Not on file  . Number of children: Not on file  . Years of education: Not on file  . Highest education level: Not on file  Occupational History  . Not on file  Social Needs  . Financial resource strain: Not on file  . Food insecurity:    Worry: Not on file    Inability: Not on file  . Transportation needs:    Medical: Not on file    Non-medical: Not on file  Tobacco Use  . Smoking status: Never Smoker  . Smokeless tobacco: Never Used  Substance and Sexual Activity  . Alcohol  use: Yes    Alcohol/week: 14.0 standard drinks    Types: 14 Cans of beer per week  . Drug use: No  . Sexual activity: Not on file  Lifestyle  . Physical activity:    Days per week: Not on file    Minutes per session: Not on file  . Stress: Not on file  Relationships  . Social connections:    Talks on phone: Not on file    Gets together: Not on file    Attends religious service: Not on file    Active member of club or organization: Not on file    Attends meetings of clubs or organizations: Not on file    Relationship status: Not on file  . Intimate partner violence:    Fear of current or ex partner: Not on file    Emotionally abused: Not on file    Physically abused: Not on file    Forced sexual activity: Not on file  Other Topics Concern  . Not on file  Social History Narrative  . Not on file    Family History  Problem Relation Age of Onset  . Sickle cell anemia Father     BP 120/85   Pulse 61   Ht 5\' 4"  (1.626 m)   Wt 180 lb (81.6 kg)   BMI 30.90 kg/m   Review of Systems: See HPI above.     Objective:  Physical Exam:  Gen: NAD, comfortable in exam room  Left ankle: Mild lateral swelling.  No other gross deformity, ecchymoses Minimal limitation motion all directions with 5/5 strength. TTP mildly over distal fibula.  No other tenderness currently. Negative ant drawer and talar tilt.   Negative syndesmotic compression. Thompsons test negative. NV intact distally.  MSK u/s left ankle:  Excellent callus formation over distal fibula fracture.  Assessment & Plan:  1. Left ankle injury -secondary to distal fibula fracture above the level of the ankle joint.  Excellent healing noted on ultrasound.  She still using the cam walker and believe a lot of her pain is related to stiffness secondary to use.  Advised at this time that she start physical therapy and do home exercises on days she does not go to therapy.  Tylenol or ibuprofen if needed.  Icing if needed.   Transition out of the boot to a more supportive shoe.  Follow-up in 6 weeks for reevaluation.

## 2018-02-18 NOTE — Patient Instructions (Signed)
You're doing great! Start physical therapy and do home exercises on days you don't go to therapy. Tylenol 500mg  1-2 tabs three times a day. Ibuprofen 600mg  three times a day with food for pain and inflammation. Icing 15 minutes at a time 3-4 times a day as needed. Try to transition out of the boot as tolerated into a supportive shoe. Follow up with me in 6 weeks for reevaluation.

## 2018-02-24 ENCOUNTER — Other Ambulatory Visit: Payer: Self-pay

## 2018-02-24 ENCOUNTER — Encounter: Payer: Self-pay | Admitting: Physical Therapy

## 2018-02-24 ENCOUNTER — Ambulatory Visit: Payer: Medicare Other | Attending: Family Medicine | Admitting: Physical Therapy

## 2018-02-24 DIAGNOSIS — M25672 Stiffness of left ankle, not elsewhere classified: Secondary | ICD-10-CM

## 2018-02-24 DIAGNOSIS — R262 Difficulty in walking, not elsewhere classified: Secondary | ICD-10-CM

## 2018-02-24 DIAGNOSIS — M25572 Pain in left ankle and joints of left foot: Secondary | ICD-10-CM | POA: Diagnosis present

## 2018-02-24 DIAGNOSIS — M6281 Muscle weakness (generalized): Secondary | ICD-10-CM

## 2018-02-24 NOTE — Therapy (Signed)
[facility] [address]  [address] [place], [place], [address] Phone: [phone]   Fax:  [phone]  Physical Therapy Evaluation  Patient Details  Name: Olivia Morales MRN: [id number] Date of Birth: 12/05/1950 Referring Provider: Norton Blizzard, MD   Encounter Date: 02/24/2018  PT End of Session - 02/24/18 1136    Visit Number  1    Number of Visits  13    Date for PT Re-Evaluation  04/07/18    Authorization Type  [employer] Medicare    PT Start Time  0802    PT Stop Time  0845    PT Time Calculation (min)  43 min    Activity Tolerance  Patient tolerated treatment well    Behavior During Therapy  [facility] for tasks assessed/performed       Past Medical History:  Diagnosis Date  . Diabetes mellitus   . Hypercholesteremia   . Hypertension   . Sleep apnea     Past Surgical History:  Procedure Laterality Date  . TUBAL LIGATION      There were no vitals filed for this visit.   Subjective Assessment - 02/24/18 0804    Subjective  Patient reports in June 2019 she fell and sustained L distal fibular fracture. Was put in CAM boot- told to wear it nonstop for 6 weeks. Saw MD 02/18/18 who told her she is allowed to wean off the boot. Current symptoms include swelling, pain over dorsal aspect of foot and lateral ankle. Denies N/T. Reports she tried to walk with shoes on the other day and felt unsure of herself. Currently furniture walks because she feels unsteady and has avoided going into her backyard because that is where she initially fell; using cane intermittently. Reports she has not had trouble with balance before her fall- however reports that she fell 2x the week of her fracture. Reports she noticed that she got a little dizzy when she fell- has not talked to MD about dizziness and has not been dizzy since. 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     Pertinent  History  HTN, HLD, DM    Limitations  Lifting;Standing;Walking;House hold activities    How long can you sit comfortably?  unlimited     How long can you stand comfortably?  3-4 hours without CAM boot    How long can you walk comfortably?  1 hour without CAM boot    Diagnostic tests  02/18/18 MSK u/s left ankle: Excellent callus formation over distal fibula fracture; 01/03/18 L ankle xray: Left fibular fracture as above with apparent partial interval healing    Patient Stated Goals  walking and physical exercise    Currently in Pain?  No/denies    Pain Location  Ankle    Pain Orientation  Left    Pain Type  Chronic pain    Aggravating Factors   crossing ankles, eversion    Pain Relieving Factors  tylenol         [facility] PT Assessment - 02/24/18 0820      Assessment   Medical Diagnosis  L ankle injury. subsequent encounter    Referring Provider  Norton Blizzard, MD    Onset Date/Surgical Date  12/09/17    Next MD Visit  04/01/18    Prior Therapy  Yes      Precautions   Precautions  --   per patient "limited activity"  Restrictions   Weight Bearing Restrictions  No      Balance Screen   Has the patient fallen in the past 6 months  Yes    How many times?  2    Has the patient had a decrease in activity level because of a fear of falling?   Yes    Is the patient reluctant to leave their home because of a fear of falling?   No      Home Public house manager residence    Living Arrangements  Spouse/significant other    Available Help at Discharge  Family    Type of Home  House    Home Access  Stairs to enter    Entrance Stairs-Number of Steps  4    Entrance Stairs-Rails  Right;Left    Home Layout  One level    Home Equipment  [place] - single point      Prior Function   Level of Independence  Independent    Vocation  Retired    Leisure  walking      Cognition   Overall Cognitive Status  Within Functional Limits for tasks assessed       Observation/Other Assessments   Observations  moderate L ankle swelling in dorsal aspect, lateral malleolus, posterior to lateral malleolus    Skin Integrity  --    Focus on Therapeutic Outcomes (FOTO)   Ankle: 44 (56% limited, 43% predicted)      Sensation   Light Touch  Appears Intact      Coordination   Gross Motor Movements are Fluid and Coordinated  Yes      Posture/Postural Control   Posture/Postural Control  Postural limitations    Postural Limitations  Rounded Shoulders      ROM / Strength   AROM / PROM / Strength  AROM;PROM;Strength      AROM   AROM Assessment Site  Ankle    Right/Left Ankle  Right;Left    Right Ankle Dorsiflexion  6   knee straight   Right Ankle Plantar Flexion  50    Right Ankle Inversion  28    Right Ankle Eversion  15    Left Ankle Dorsiflexion  0   knee straight   Left Ankle Plantar Flexion  37    Left Ankle Inversion  12    Left Ankle Eversion  5   pain in lateral malleolus     Strength   Strength Assessment Site  Hip;Knee;Ankle    Right/Left Hip  Right;Left    Right Hip Flexion  4+/5    Right Hip ABduction  4/5    Right Hip ADduction  4/5    Left Hip Flexion  4-/5    Left Hip ABduction  4/5    Left Hip ADduction  4/5    Right/Left Knee  Right;Left    Right Knee Flexion  5/5    Right Knee Extension  5/5    Left Knee Flexion  4/5    Left Knee Extension  4+/5    Right/Left Ankle  Right;Left    Right Ankle Dorsiflexion  4+/5    Right Ankle Plantar Flexion  4+/5    Right Ankle Inversion  4+/5    Right Ankle Eversion  4+/5    Left Ankle Dorsiflexion  4/5   pain in dorsal foot   Left Ankle Plantar Flexion  4/5    Left Ankle Inversion  4/5    Left Ankle Eversion  4/5      Flexibility   Soft Tissue Assessment /Muscle Length  yes   B gastrocs tight     Palpation   Palpation comment  TTP over L lateral malleolus and slightly posterior      Ambulation/Gait   Assistive device  None    Gait Pattern  Step-through pattern;Decreased  dorsiflexion - left;Decreased step length - right;Decreased stance time - left;Decreased weight shift to left    Ambulation Surface  Level;Indoor    Gait velocity  WFL                Objective measurements completed on examination: See above findings.              PT Education - 02/24/18 1135    Education Details  prognosis, POC, HEP, advised to continue HEP given by MD    Person(s) Educated  Patient    Methods  Explanation;Demonstration;Tactile cues;Verbal cues;Handout    Comprehension  Returned demonstration;Verbalized understanding       PT Short Term Goals - 02/24/18 1144      PT SHORT TERM GOAL #1   Title  Patient to be independent with initial HEP.    Time  3    Period  Weeks    Status  New    Target Date  03/17/18        PT Long Term Goals - 02/24/18 1144      PT LONG TERM GOAL #1   Title  Patient to be independent with advanced HEP.    Time  6    Period  Weeks    Status  New    Target Date  04/07/18      PT LONG TERM GOAL #2   Title  [facility] AROM/PROM.    Time  6    Period  Weeks    Status  New    Target Date  04/07/18      PT LONG TERM GOAL #3   Title  Patient to demonstrate B LE strength >=4+/5.    Time  6    Period  Weeks    Status  New    Target Date  04/07/18      PT LONG TERM GOAL #4   Title  Patient to demonstrate each LE SLS for 10 sec without LOB.    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     Time  6    Period  Weeks    Status  New    Target Date  04/07/18             Plan - 02/24/18 1137    Clinical Impression Statement  Patient is a 67y/o F presenting to OPPT with c/o L ankle pain after closed fracture of L distal fibula on 12/13/17. MSK US on 02/18/18 showed "excellent callus formation over distal fibula  fracture." Patient still wearing CAM boot, noting that she still feels unsteady without it, however MD recommending transition to supportive shoe. Also reports using SPC intermittently d/t unsteadiness. Current symptoms include edema and pain over dorsal aspect of foot, lateral malleolus, and posterior to lateral malleolus. Patient today with moderate L ankle edema, tenderness  to palpation, limited and painful L ankle AROM, decreased strength, decreased flexibility, and gait deviations. Educated on and received handout for gentle stretching and strengthening HEP to be performed at counter top. Also advised patient to continue MD administered HEP. Patient reported understanding. Would benefit from skilled PT services 2x/week for 6 weeks to address aforementioned impairments.     Clinical Presentation  Stable    Clinical Decision Making  Low    Rehab Potential  Good    PT Frequency  2x / week    PT Duration  6 weeks    PT Treatment/Interventions  ADLs/Self Care Home Management;Cryotherapy;Electrical Stimulation;Iontophoresis 4mg /ml Dexamethasone;Moist Heat;Ultrasound;DME Instruction;Gait training;Stair training;Functional mobility training;Therapeutic activities;Therapeutic exercise;Manual techniques;Orthotic Fit/Training;Patient/family education;Neuromuscular re-education;Balance training;Scar mobilization;Passive range of motion;Dry needling;Energy conservation;Splinting;Taping;Vasopneumatic Device    PT Next Visit Plan  assess SLS time on each LE, assess L ankle PROM    Consulted and Agree with Plan of Care  Patient       Patient will benefit from skilled therapeutic intervention in order to improve the following deficits and impairments:  Hypomobility, Increased edema, Decreased activity tolerance, Decreased strength, Pain, Decreased balance, Difficulty walking, Decreased range of motion, Impaired flexibility  Visit Diagnosis: Pain in left ankle and joints of left foot  Stiffness of left ankle,  not elsewhere classified  Difficulty in walking, not elsewhere classified  Muscle weakness (generalized)     Problem List Patient Active Problem List   Diagnosis Date Noted  . Left ankle injury, subsequent encounter 12/19/2017  . Class 2 severe obesity due to excess calories with serious comorbidity and body mass index (BMI) of 35.0 to 35.9 in adult (HCC) 03/05/2017  . 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 Nocturia more than twice per night 02/08/2016  . Noncompliance with CPAP treatment 02/08/2016  . CKD (chronic kidney disease), stage III (HCC) 01/11/2014  . Hyperlipidemia associated with type 2 diabetes mellitus (HCC) 09/14/2013  . Hypertension associated with diabetes (HCC) 10/31/2011  . Diabetes mellitus (HCC) 10/31/2011    Olivia Morales, PT, DPT 02/24/18 11:51 AM   [facility] [address]  [address] [place], [place], [address] Phone: [phone]   Fax:  [phone]  Name: Olivia Morales MRN: [id number] Date of Birth: [birth year]

## 2018-02-26 ENCOUNTER — Ambulatory Visit: Payer: Medicare Other | Admitting: Rehabilitative and Restorative Service Providers"

## 2018-02-26 ENCOUNTER — Encounter: Payer: Self-pay | Admitting: Rehabilitative and Restorative Service Providers"

## 2018-02-26 ENCOUNTER — Ambulatory Visit: Payer: Medicare Other | Admitting: Physical Therapy

## 2018-02-26 DIAGNOSIS — M25572 Pain in left ankle and joints of left foot: Secondary | ICD-10-CM | POA: Diagnosis not present

## 2018-02-26 DIAGNOSIS — R262 Difficulty in walking, not elsewhere classified: Secondary | ICD-10-CM

## 2018-02-26 DIAGNOSIS — M6281 Muscle weakness (generalized): Secondary | ICD-10-CM

## 2018-02-26 DIAGNOSIS — M25672 Stiffness of left ankle, not elsewhere classified: Secondary | ICD-10-CM

## 2018-02-26 NOTE — Therapy (Signed)
[facility] [place] [address]  [address] [place], [place], [address] Phone: [phone]   Fax:  [phone]  Physical Therapy Treatment  Patient Details  Name: Olivia Morales MRN: [id number] Date of Birth: July 26, 1950 Referring Provider: Norton BlizzardShane hudnall, MD   Encounter Date: 02/26/2018  PT End of Session - 02/26/18 0837    Visit Number  2    Number of Visits  13    Date for PT Re-Evaluation  04/07/18    Authorization Type  UHC Medicare    PT Start Time  [id number]    PT Stop Time  0937    PT Time Calculation (min)  59 min    Activity Tolerance  Patient tolerated treatment well       Past Medical History:  Diagnosis Date  . Diabetes mellitus   . Hypercholesteremia   . Hypertension   . Sleep apnea     Past Surgical History:  Procedure Laterality Date  . TUBAL LIGATION      There were no vitals filed for this visit.  Subjective Assessment - 02/26/18 0849    Subjective  Patient reports that she has been doing her exercises at home. She has had some pain this morning - it is hurting across the toes.     Currently in Pain?  Yes    Pain Score  5     Pain Location  Ankle    Pain Orientation  Left    Pain Descriptors / Indicators  Sharp    Pain Type  Chronic pain         [facility] PT Assessment - 02/26/18 0001      Assessment   Medical Diagnosis  L ankle injury. subsequent encounter    Referring Provider  Norton BlizzardShane hudnall, MD    Onset Date/Surgical Date  12/09/17    Next MD Visit  04/01/18    Prior Therapy  Yes      PROM   Left Ankle Dorsiflexion  10    Left Ankle Plantar Flexion  43      Standardized Balance Assessment   Standardized Balance Assessment  --   SLS Rt 21 sec; Lt 9 sec                   [facility] Adult PT Treatment/Exercise - 02/26/18 0001      Vasopneumatic   Number Minutes Vasopneumatic   15 minutes    Vasopnuematic Location   Ankle   Lt   Vasopneumatic Pressure  Medium     Vasopneumatic Temperature   34 deg       Manual Therapy   Manual therapy comments  pt supine LE's elevated     Joint Mobilization  gentle mobs through the calcaneous; talus; metatarsals Lt ankle/foot    Soft tissue mobilization  soft tissue work and retrograde massage Lt foot/ankle    Passive ROM  stretching into toe flexion with ankle PF; stretching ankle DF/PF/Inv/Ever 10-20 sec hold x 4-5 reps each       Ankle Exercises: Stretches   Soleus Stretch  3 reps;30 seconds   standing    Gastroc Stretch  3 reps;30 seconds   standing      Ankle Exercises: Aerobic   Stepper  for ROM pt assisting with UE (8) x 4 min       Ankle Exercises: Standing   SLS  Rt/Lt x 20 sec x 3 reps x 20 sec UE support as needed Lt  Ankle Exercises: Seated   Heel Raises  Left;20 reps    Toe Raise  20 reps   Lt   BAPS  Sitting;Level 3;10 reps   DF/PF; circles CW/CCW     Ankle Exercises: Supine   Other Supine Ankle Exercises  ankle pumps; ankle circles x 20 each; A-Z x 1             PT Education - 02/26/18 0923    Education Details  HEP    Person(s) Educated  Patient    Methods  Explanation;Demonstration;Tactile cues;Verbal cues;Handout    Comprehension  Verbalized understanding;Returned demonstration;Verbal cues required;Tactile cues required       PT Short Term Goals - 02/24/18 1144      PT SHORT TERM GOAL #1   Title  Patient to be independent with initial HEP.    Time  3    Period  Weeks    Status  New    Target Date  03/17/18        PT Long Term Goals - 02/24/18 1144      PT LONG TERM GOAL #1   Title  Patient to be independent with advanced HEP.    Time  6    Period  Weeks    Status  New    Target Date  04/07/18      PT LONG TERM GOAL #2   Title  Patient to demonstrate [facility] and pain-free L ankle AROM/PROM.    Time  6    Period  Weeks    Status  New    Target Date  04/07/18      PT LONG TERM GOAL #3   Title  Patient to demonstrate B LE strength >=4+/5.    Time  6     Period  Weeks    Status  New    Target Date  04/07/18      PT LONG TERM GOAL #4   Title  Patient to demonstrate each LE SLS for 10 sec without LOB.    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     Time  6    Period  Weeks    Status  New    Target Date  04/07/18            Plan - 02/26/18 4098    Clinical Impression Statement  Patient presents with some pain upon awakening. She tolerated exercises well in clinic today. Good response to manual work including PROM and mobilization. Encouraged patient to wean from the boot and begin wearing supportive tennis shoes for home. Patient was also ask to bring or wear her shoe in for therapy sessions.     Rehab Potential  Good    PT Frequency  2x / week    PT Duration  6 weeks    PT Treatment/Interventions  ADLs/Self Care Home Management;Cryotherapy;Electrical Stimulation;Iontophoresis 4mg /ml Dexamethasone;Moist Heat;Ultrasound;DME Instruction;Gait training;Stair training;Functional mobility training;Therapeutic activities;Therapeutic exercise;Manual techniques;Orthotic Fit/Training;Patient/family education;Neuromuscular re-education;Balance training;Scar mobilization;Passive range of motion;Dry needling;Energy conservation;Splinting;Taping;Vasopneumatic Device    PT Next Visit Plan  review HEP and assess response to manual work; progress exercises as indicated     Consulted and Agree with Plan of Care  Patient  Patient will benefit from skilled therapeutic intervention in order to improve the following deficits and impairments:  Hypomobility, Increased edema, Decreased activity tolerance, Decreased strength, Pain, Decreased balance, Difficulty walking, Decreased range of motion, Impaired flexibility  Visit Diagnosis: Pain in left ankle and joints of left foot  Stiffness of left  ankle, not elsewhere classified  Difficulty in walking, not elsewhere classified  Muscle weakness (generalized)     Problem List Patient Active Problem List   Diagnosis Date Noted  . Left ankle injury, subsequent encounter 12/19/2017  . Class 2 severe obesity due to excess calories with serious comorbidity and body mass index (BMI) of 35.0 to 35.9 in adult (HCC) 03/05/2017  . 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 Nocturia more than twice per night 02/08/2016  . Noncompliance with CPAP treatment 02/08/2016  . CKD (chronic kidney disease), stage III (HCC) 01/11/2014  . Hyperlipidemia associated with type 2 diabetes mellitus (HCC) 09/14/2013  . Hypertension associated with diabetes (HCC) 10/31/2011  . Diabetes mellitus (HCC) 10/31/2011    Rayquon Uselman Rober Minion PT, MPH 02/26/2018, 9:46 AM  [facility] [address]  [address] [place], [place], [address] Phone: [phone]   Fax:  [phone]  Name: Massiel Stipp MRN: [id number] Date of Birth: 1951/06/10

## 2018-03-05 ENCOUNTER — Ambulatory Visit: Payer: Medicare Other | Attending: Family Medicine

## 2018-03-05 ENCOUNTER — Encounter: Payer: Self-pay | Admitting: Medical

## 2018-03-05 ENCOUNTER — Ambulatory Visit: Payer: Medicare Other | Admitting: Medical

## 2018-03-05 VITALS — BP 148/82

## 2018-03-05 VITALS — BP 140/79 | HR 57 | Temp 98.1°F | Resp 16 | Ht 64.0 in | Wt 189.0 lb

## 2018-03-05 DIAGNOSIS — M25572 Pain in left ankle and joints of left foot: Secondary | ICD-10-CM

## 2018-03-05 DIAGNOSIS — R262 Difficulty in walking, not elsewhere classified: Secondary | ICD-10-CM

## 2018-03-05 DIAGNOSIS — M6281 Muscle weakness (generalized): Secondary | ICD-10-CM

## 2018-03-05 DIAGNOSIS — N183 Chronic kidney disease, stage 3 unspecified: Secondary | ICD-10-CM

## 2018-03-05 DIAGNOSIS — E1169 Type 2 diabetes mellitus with other specified complication: Secondary | ICD-10-CM | POA: Diagnosis not present

## 2018-03-05 DIAGNOSIS — E1159 Type 2 diabetes mellitus with other circulatory complications: Secondary | ICD-10-CM | POA: Diagnosis not present

## 2018-03-05 DIAGNOSIS — E118 Type 2 diabetes mellitus with unspecified complications: Secondary | ICD-10-CM

## 2018-03-05 DIAGNOSIS — I10 Essential (primary) hypertension: Secondary | ICD-10-CM

## 2018-03-05 DIAGNOSIS — Z794 Long term (current) use of insulin: Secondary | ICD-10-CM

## 2018-03-05 DIAGNOSIS — M25672 Stiffness of left ankle, not elsewhere classified: Secondary | ICD-10-CM

## 2018-03-05 DIAGNOSIS — E785 Hyperlipidemia, unspecified: Secondary | ICD-10-CM

## 2018-03-05 NOTE — Patient Instructions (Signed)
Good to meet you today.  Your blood pressure is borderline elevated today but you were a little bit delayed in taking your blood pressure medication.  I am writing you a prescription for blood pressure machine.  Would recommend checking blood pressures daily and giving [place] update on readings in 7 to 10 days.  With that high blood pressure and diabetes, I would prefer your blood pressure be closer 130/80.  For diabetes, continue with Glucotrol.  He will formally on metformin and Ozempic was too expensive.  I think the metformin was stopped recently due to decreased kidney function.  You mentioned that Trulicity is much more reasonably priced.  We will wait for your metabolic panel and A1c level then decide on prescribing Trulicity.  For history of high cholesterol, I am placing labs as future since you are not fasting today.  Please get scheduled for blood work tomorrow, Friday or on Monday.  Recommend a low sugar and low-cholesterol diet.  Try to get at least 6000 steps a day walking.  Follow-up date to be determined after lab review.

## 2018-03-05 NOTE — Therapy (Addendum)
[facility] [address]  [address] [place], [place], [address] Phone: [phone]   Fax:  [phone]  Physical Therapy Treatment  Patient Details  Name: Olivia Morales MRN: [id number] Date of Birth: 03/24/1951 Referring Provider: Norton Blizzard, MD   Encounter Date: 03/05/2018  PT End of Session - 03/05/18 1021    Visit Number  3    Number of Visits  13    Date for PT Re-Evaluation  04/07/18    Authorization Type  UHC Medicare    PT Start Time  1016    PT Stop Time  1104    PT Time Calculation (min)  48 min    Activity Tolerance  Patient tolerated treatment well    Behavior During Therapy  [facility] for tasks assessed/performed       Past Medical History:  Diagnosis Date  . Diabetes mellitus   . Hypercholesteremia   . Hypertension   . Sleep apnea     Past Surgical History:  Procedure Laterality Date  . TUBAL LIGATION      Vitals:   03/05/18 1017  BP: (!) 148/82    Subjective Assessment - 03/05/18 1017    Subjective  Pt. reporting recent MD f/u this morning went well.      Pertinent History  HTN, HLD, DM    Diagnostic tests  02/18/18 [facility] u/s left ankle: Excellent callus formation over distal fibula fracture; 01/03/18 L ankle xray: Left fibular fracture as above with apparent partial interval healing    Patient Stated Goals  walking and physical exercise    Currently in Pain?  No/denies    Pain Score  0-No pain   up to 8/10 "throbbing" pain while driving in car this morning    Pain Location  Ankle    Pain Orientation  Left    Pain Descriptors / Indicators  Throbbing    Pain Type  Chronic pain    Multiple Pain Sites  No                       [facility] Adult PT Treatment/Exercise - 03/05/18 1037      Knee/Hip Exercises: Standing   Forward Step Up  Left;10 reps;Step Height: 6";Hand Hold: 1    Forward Step Up Limitations  1 ski pole and cues for L LE muscular activation       Vasopneumatic    Number Minutes Vasopneumatic   10 minutes    Vasopnuematic Location   Ankle   L    Vasopneumatic Pressure  Medium    Vasopneumatic Temperature   34 deg       Manual Therapy   Manual Therapy  Soft tissue mobilization    Manual therapy comments  supine     Soft tissue mobilization  Gentle STM to L foot/ankle in elevated positioning to promote reduction in edema     Passive ROM  L manual gastroc, soleus stretch with therapist x 30 sec each way       Ankle Exercises: Aerobic   Nustep  Lvl 2, 5 min       Ankle Exercises: Stretches   Soleus Stretch  3 reps;30 seconds    Gastroc Stretch  3 reps;30 seconds      Ankle Exercises: Standing   SLS  L SLS 3 x 20 sec with opposite LE cone touch     Other Standing Ankle Exercises  Forward/backwards tandem walk 5 x 10 ft at table  with intermittent UE support      Ankle Exercises: Seated   Other Seated Ankle Exercises  Sit<>stand with adduction balll squeeze working on even wt. distribution x 10 rpes              PT Education - 03/05/18 1552    Education Details  HEP update     Person(s) Educated  Patient    Methods  Explanation;Demonstration;Verbal cues;Handout    Comprehension  Verbalized understanding;Returned demonstration;Verbal cues required       PT Short Term Goals - 03/05/18 1024      PT SHORT TERM GOAL #1   Title  Patient to be independent with initial HEP.    Time  3    Period  Weeks    Status  On-going        PT Long Term Goals - 03/05/18 1024      PT LONG TERM GOAL #1   Title  Patient to be independent with advanced HEP.    Time  6    Period  Weeks    Status  On-going      PT LONG TERM GOAL #2   Title  Patient to demonstrate [facility] and pain-free L ankle AROM/PROM.    Time  6    Period  Weeks    Status  On-going      PT LONG TERM GOAL #3   Title  Patient to demonstrate B LE strength >=4+/5.    Time  6    Period  Weeks    Status  On-going      PT LONG TERM GOAL #4   Title  Patient to demonstrate each  LE SLS for 10 sec without LOB.    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     Time  6    Period  Weeks    Status  On-going            Plan - 03/05/18 1024    Clinical Impression Statement  Pt. with elevated BP initially at 148/182 mmHg to start session however feeling well.  Felt fine after last visit without soreness.  Feels she is tolerating HEP without issue.  Tolerated progression of stepping, SLS, and LE stretching well today for hopeful improvement in L LE strength and ROM.  Ended visit with ice/compression to L ankle to promote reduction in swelling.  Will continue to progress toward goals.      PT Treatment/Interventions  ADLs/Self Care Home Management;Cryotherapy;Electrical Stimulation;Iontophoresis 4mg /ml Dexamethasone;Moist Heat;Ultrasound;DME Instruction;Gait training;Stair training;Functional mobility training;Therapeutic activities;Therapeutic exercise;Manual techniques;Orthotic Fit/Training;Patient/family education;Neuromuscular re-education;Balance training;Scar mobilization;Passive range of motion;Dry needling;Energy conservation;Splinting;Taping;Vasopneumatic Device    Consulted and Agree with Plan of Care  Patient       Patient will benefit from skilled therapeutic intervention in order to improve the following deficits and impairments:  Hypomobility, Increased edema, Decreased activity tolerance, Decreased strength, Pain, Decreased balance, Difficulty walking, Decreased range of motion, Impaired flexibility  Visit Diagnosis: Pain in left ankle and joints of left foot  Stiffness of left ankle, not elsewhere classified  Difficulty in walking, not elsewhere classified  Muscle weakness (generalized)     Problem List Patient Active Problem List   Diagnosis Date Noted  . Left ankle injury, subsequent encounter 12/19/2017  . Class  2 severe obesity due to excess calories with serious comorbidity and body mass index (BMI) of 35.0 to 35.9 in adult (HCC) 03/05/2017  . 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 Nocturia more than twice per night 02/08/2016  . Noncompliance with CPAP treatment 02/08/2016  . CKD (chronic kidney disease), stage III (HCC) 01/11/2014  . Hyperlipidemia associated with type 2 diabetes mellitus (HCC) 09/14/2013  . Hypertension associated with diabetes (HCC) 10/31/2011  . Diabetes mellitus (HCC) 10/31/2011    Kermit Balo, PTA 03/05/18 3:52 PM   [facility] [address]  [address] [place], [place], [address] Phone: [phone]   Fax:  [phone]  Name: Aalijah Alicea MRN: [id number] Date of Birth: December 02, 1950

## 2018-03-05 NOTE — Progress Notes (Signed)
Subjective:    Patient ID: Olivia Morales, female    DOB: [birth year], 67 y.o.   MRN: [id number]  HPI  Pt in for first time.   Pt states she was seeing [facility] pcp.  Pt is retired for past 20 years.  Pt states her last former pcp had recommend ozempic. Cost of that was $900 dollars. Pt also was taken off metformin then switched to glucotrol.   Pt has decreased renal function so this is probably reason why metformin was stopped.  Pt is only on glucotrol presently. Pt thinks last a1-c was 9.0- 11.0.  Pt was also given option for trulicity. It was more reasonably priced. Just she expressed not ready to start yet as she has been eating better recently.  Pt has high cholesterol and is on lipitor.   In addition she has high blood pressure. She is on amlodipine, losartan and toprol xl.    Review of Systems  Constitutional: Negative for chills, fatigue and fever.  Respiratory: Negative for cough, chest tightness, shortness of breath and wheezing.   Cardiovascular: Negative for chest pain and palpitations.  Gastrointestinal: Negative for abdominal pain.  Endocrine: Negative for polydipsia, polyphagia and polyuria.  Musculoskeletal: Negative for back pain.  Skin: Negative for rash.  Neurological: Negative for dizziness, weakness, light-headedness and headaches.  Hematological: Negative for adenopathy. Does not bruise/bleed easily.  Psychiatric/Behavioral: Negative for behavioral problems, decreased concentration and dysphoric mood.    Past Medical History:  Diagnosis Date  . Diabetes mellitus   . Hypercholesteremia   . Hypertension   . Sleep apnea      Social History   Socioeconomic History  . Marital status: Widowed    Spouse name: Not on file  . Number of children: Not on file  . Years of education: Not on file  . Highest education level: Not on file  Occupational History  . Not on file  Social Needs  . Financial resource strain: Not on file  . Food  insecurity:    Worry: Not on file    Inability: Not on file  . Transportation needs:    Medical: Not on file    Non-medical: Not on file  Tobacco Use  . Smoking status: Never Smoker  . Smokeless tobacco: Never Used  Substance and Sexual Activity  . Alcohol use: Yes    Alcohol/week: 14.0 standard drinks    Types: 14 Cans of beer per week  . Drug use: No  . Sexual activity: Not on file  Lifestyle  . Physical activity:    Days per week: Not on file    Minutes per session: Not on file  . Stress: Not on file  Relationships  . Social connections:    Talks on phone: Not on file    Gets together: Not on file    Attends religious service: Not on file    Active member of club or organization: Not on file    Attends meetings of clubs or organizations: Not on file    Relationship status: Not on file  . Intimate partner violence:    Fear of current or ex partner: Not on file    Emotionally abused: Not on file    Physically abused: Not on file    Forced sexual activity: Not on file  Other Topics Concern  . Not on file  Social History Narrative  . Not on file    Past Surgical History:  Procedure Laterality Date  . TUBAL LIGATION  Family History  Problem Relation Age of Onset  . Sickle cell anemia Father     Allergies  Allergen Reactions  . Ace Inhibitors     Cough    Current Outpatient Medications on File Prior to Visit  Medication Sig Dispense Refill  . amLODipine (NORVASC) 10 MG tablet Take 10 mg by mouth daily.     Marland Kitchen atorvastatin (LIPITOR) 40 MG tablet Take 40 mg by mouth daily.    . diphenhydramine-acetaminophen (TYLENOL PM) 25-500 MG TABS Take 2 tablets by mouth every 4 (four) hours as needed (pain).    Marland Kitchen glipiZIDE (GLUCOTROL XL) 10 MG 24 hr tablet TAKE 1 TABLET BY MOUTH  DAILY    . hydrochlorothiazide (MICROZIDE) 12.5 MG capsule Take 1 capsule by mouth daily.    Marland Kitchen losartan (COZAAR) 25 MG tablet Take 25 mg by mouth daily.      . metoprolol succinate  (TOPROL-XL) 50 MG 24 hr tablet Take 1 tablet by mouth daily.    Marland Kitchen omeprazole (PRILOSEC) 20 MG capsule Take 20 mg by mouth daily.    Marland Kitchen PRESCRIPTION MEDICATION Place 1 drop into both eyes daily. For dry eye and allergies.     No current facility-administered medications on file prior to visit.     BP (!) 147/96   Pulse (!) 57   Temp 98.1 F (36.7 C) (Oral)   Resp 16   Ht 5\' 4"  (1.626 m)   Wt 189 lb (85.7 kg)   SpO2 100%   BMI 32.44 kg/m       Objective:   Physical Exam  General Mental Status- Alert. General Appearance- Not in acute distress.   Skin General: Color- Normal Color. Moisture- Normal Moisture.  Neck Carotid Arteries- Normal color. Moisture- Normal Moisture. No carotid bruits. No JVD.  Chest and Lung Exam Auscultation: Breath Sounds:-Normal.  Cardiovascular Auscultation:Rythm- Regular. Murmurs & Other Heart Sounds:Auscultation of the heart reveals- No Murmurs.  Abdomen Inspection:-Inspeection Normal. Palpation/Percussion:Note:No mass. Palpation and Percussion of the abdomen reveal- Non Tender, Non Distended + BS, no rebound or guarding.   Neurologic Cranial Nerve exam:- CN III-XII intact(No nystagmus), symmetric smile. Strength:- 5/5 equal and symmetric strength both upper and lower extremities.     Assessment & Plan:  Good to meet you today.  Your blood pressure is borderline elevated today but you were a little bit delayed in taking your blood pressure medication.  I am writing you a prescription for blood pressure machine.  Would recommend checking blood pressures daily and giving [place] update on readings in 7 to 10 days.  With that high blood pressure and diabetes, I would prefer your blood pressure be closer 130/80.  For diabetes, continue with Glucotrol.  He will formally on metformin and Ozempic was too expensive.  I think the metformin was stopped recently due to decreased kidney function.  You mentioned that Trulicity is much more reasonably  priced.  We will wait for your metabolic panel and A1c level then decide on prescribing Trulicity.  For history of high cholesterol, I am placing labs as future since you are not fasting today.  Please get scheduled for blood work tomorrow, Friday or on Monday.  Recommend a low sugar and low-cholesterol diet.  Try to get at least 6000 steps a day walking.  Follow-up date to be determined after lab review.  Esperanza Richters, PA-C

## 2018-03-07 ENCOUNTER — Other Ambulatory Visit (INDEPENDENT_AMBULATORY_CARE_PROVIDER_SITE_OTHER): Payer: Medicare Other

## 2018-03-07 DIAGNOSIS — E1169 Type 2 diabetes mellitus with other specified complication: Secondary | ICD-10-CM

## 2018-03-07 DIAGNOSIS — E785 Hyperlipidemia, unspecified: Secondary | ICD-10-CM | POA: Diagnosis not present

## 2018-03-07 DIAGNOSIS — E1159 Type 2 diabetes mellitus with other circulatory complications: Secondary | ICD-10-CM

## 2018-03-07 DIAGNOSIS — I152 Hypertension secondary to endocrine disorders: Secondary | ICD-10-CM

## 2018-03-07 DIAGNOSIS — I10 Essential (primary) hypertension: Secondary | ICD-10-CM | POA: Diagnosis not present

## 2018-03-07 DIAGNOSIS — Z794 Long term (current) use of insulin: Secondary | ICD-10-CM

## 2018-03-07 DIAGNOSIS — E118 Type 2 diabetes mellitus with unspecified complications: Secondary | ICD-10-CM | POA: Diagnosis not present

## 2018-03-07 LAB — COMPREHENSIVE METABOLIC PANEL
ALT: 9 U/L (ref 0–35)
AST: 12 U/L (ref 0–37)
Albumin: 4.4 g/dL (ref 3.5–5.2)
Alkaline Phosphatase: 98 U/L (ref 39–117)
BUN: 25 mg/dL — AB (ref 6–23)
CHLORIDE: 102 meq/L (ref 96–112)
CO2: 32 mEq/L (ref 19–32)
Calcium: 10.3 mg/dL (ref 8.4–10.5)
Creatinine, Ser: 1.46 mg/dL — ABNORMAL HIGH (ref 0.40–1.20)
GFR: 46.16 mL/min — ABNORMAL LOW (ref >60.00)
Glucose, Bld: 192 mg/dL — ABNORMAL HIGH (ref 70–99)
POTASSIUM: 4.1 meq/L (ref 3.5–5.1)
Sodium: 140 mEq/L (ref 135–145)
TOTAL PROTEIN: 7.3 g/dL (ref 6.0–8.3)
Total Bilirubin: 0.6 mg/dL (ref 0.2–1.2)

## 2018-03-07 LAB — HEMOGLOBIN A1C: Hgb A1c MFr Bld: 8.3 % — ABNORMAL HIGH (ref 4.6–6.5)

## 2018-03-07 LAB — LIPID PANEL
Cholesterol: 125 mg/dL (ref 0–200)
HDL: 34.5 mg/dL — ABNORMAL LOW (ref >39.00)
LDL CALC: 62 mg/dL (ref 0–99)
NonHDL: 90.57
Total CHOL/HDL Ratio: 4
Triglycerides: 143 mg/dL (ref 0.0–149.0)
VLDL: 28.6 mg/dL (ref 0.0–40.0)

## 2018-03-09 ENCOUNTER — Telehealth: Payer: Self-pay | Admitting: Medical

## 2018-03-09 MED ORDER — DULAGLUTIDE 0.75 MG/0.5ML ~~LOC~~ SOAJ: SUBCUTANEOUS | 0 refills | Status: DC

## 2018-03-09 NOTE — Telephone Encounter (Signed)
Sent in rx of trulicity to patient pharmacy.

## 2018-03-12 ENCOUNTER — Ambulatory Visit: Payer: Medicare Other

## 2018-03-12 DIAGNOSIS — M25572 Pain in left ankle and joints of left foot: Secondary | ICD-10-CM

## 2018-03-12 DIAGNOSIS — R262 Difficulty in walking, not elsewhere classified: Secondary | ICD-10-CM

## 2018-03-12 DIAGNOSIS — M25672 Stiffness of left ankle, not elsewhere classified: Secondary | ICD-10-CM

## 2018-03-12 DIAGNOSIS — M6281 Muscle weakness (generalized): Secondary | ICD-10-CM

## 2018-03-12 NOTE — Therapy (Signed)
[facility] [address]  [address] [place], [place], [address] Phone: [phone]   Fax:  [phone]  Physical Therapy Treatment  Patient Details  Name: Olivia Morales MRN: [id number] Date of Birth: 03/20/1951 Referring Provider: Norton Blizzard, MD   Encounter Date: 03/12/2018  PT End of Session - 03/12/18 0810    Visit Number  4    Number of Visits  13    Date for PT Re-Evaluation  04/07/18    Authorization Type  UHC Medicare    PT Start Time  0804    PT Stop Time  0844    PT Time Calculation (min)  40 min    Activity Tolerance  Patient tolerated treatment well    Behavior During Therapy  [facility] for tasks assessed/performed       Past Medical History:  Diagnosis Date  . Diabetes mellitus   . Hypercholesteremia   . Hypertension   . Sleep apnea     Past Surgical History:  Procedure Laterality Date  . TUBAL LIGATION      There were no vitals filed for this visit.  Subjective Assessment - 03/12/18 0807    Subjective  Pt. doing well today.  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      Pertinent History  HTN, HLD, DM    How long can you sit comfortably?  unlimited     How long can you stand comfortably?  15 min     How long can you walk comfortably?  30 min     Diagnostic tests  02/18/18 MSK u/s left ankle: Excellent callus formation over distal fibula fracture; 01/03/18 L ankle xray: Left fibular fracture as above with apparent partial interval healing    Patient Stated Goals  walking and physical exercise    Currently in Pain?  No/denies    Pain Score  0-No pain    Multiple Pain Sites  No                       [facility] Adult PT Treatment/Exercise - 03/12/18 0818      Knee/Hip Exercises: Standing   Hip Flexion  Right;10 reps;Knee straight;Stengthening    Hip Flexion Limitations  yellow TB at ankle; 2 ski poles     Hip ADduction  Right;10  reps;Strengthening    Hip ADduction Limitations  yellow TB at ankle; 2 ski poles     Hip Abduction  Right;10 reps;Knee straight;Stengthening    Abduction Limitations  yellow TB at ankle; 2 ski poles     Hip Extension  Right;10 reps;Knee straight;Stengthening    Extension Limitations  yellow TB at ankle; 2 ski poles       Ankle Exercises: Seated   BAPS  Sitting;Level 3;10 reps    BAPS Limitations  front/back, R/L, CW, CCW x 15 reps       Ankle Exercises: Aerobic   Nustep  Lvl 4, 7 min       Ankle Exercises: Stretches   Soleus Stretch  3 reps;30 seconds    Gastroc Stretch  3 reps;30 seconds      Ankle Exercises: Standing   Heel Walk (Round Trip)  2 x 15 ft at counter    tolerated well    Toe Walk (Round Trip)  2 x 15 ft at counter    tolerated well    Side Shuffle (Round Trip)  Side stepping with  red TB at forefoot 2 x 15 ft at counter    tolerated well             PT Education - 03/12/18 0908    Education Details  HEP update     Person(s) Educated  Patient    Methods  Explanation;Demonstration;Verbal cues;Handout    Comprehension  Verbalized understanding;Returned demonstration;Verbal cues required;Need further instruction       PT Short Term Goals - 03/12/18 0909      PT SHORT TERM GOAL #1   Title  Patient to be independent with initial HEP.    Time  3    Period  Weeks    Status  Achieved        PT Long Term Goals - 03/05/18 1024      PT LONG TERM GOAL #1   Title  Patient to be independent with advanced HEP.    Time  6    Period  Weeks    Status  On-going      PT LONG TERM GOAL #2   Title  Patient to demonstrate [facility] and pain-free L ankle AROM/PROM.    Time  6    Period  Weeks    Status  On-going      PT LONG TERM GOAL #3   Title  Patient to demonstrate B LE strength >=4+/5.    Time  6    Period  Weeks    Status  On-going      PT LONG TERM GOAL #4   Title  Patient to demonstrate each LE SLS for 10 sec without LOB.    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     Time  6    Period  Weeks    Status  On-going            Plan - 03/12/18 7846    Clinical Impression Statement  Pt. doing well today.  Notes she has been doing well with updated HEP.  Tolerated addition of side stepping with red TB at forefoot and progression of SLS activities well today.  Ended visit with pt. noting she was pain free thus modalities deferred.  HEP updated.  Progressing well toward goals.      PT Treatment/Interventions  ADLs/Self Care Home Management;Cryotherapy;Electrical Stimulation;Iontophoresis 4mg /ml Dexamethasone;Moist Heat;Ultrasound;DME Instruction;Gait training;Stair training;Functional mobility training;Therapeutic activities;Therapeutic exercise;Manual techniques;Orthotic Fit/Training;Patient/family education;Neuromuscular re-education;Balance training;Scar mobilization;Passive range of motion;Dry needling;Energy conservation;Splinting;Taping;Vasopneumatic Device    Consulted and Agree with Plan of Care  Patient       Patient will benefit from skilled therapeutic intervention in order to improve the following deficits and impairments:  Hypomobility, Increased edema, Decreased activity tolerance, Decreased strength, Pain, Decreased balance, Difficulty walking, Decreased range of motion, Impaired flexibility  Visit Diagnosis: Pain in left ankle and joints of left foot  Stiffness of left ankle, not elsewhere classified  Difficulty in walking, not elsewhere classified  Muscle weakness (generalized)     Problem List Patient Active Problem List   Diagnosis Date Noted  . Left ankle injury, subsequent encounter 12/19/2017  . Class 2 severe obesity due to excess calories with serious comorbidity and body mass index (BMI) of 35.0 to 35.9 in adult (HCC) 03/05/2017  . 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  Nocturia more than twice per night 02/08/2016  . Noncompliance with CPAP treatment 02/08/2016  . CKD (chronic kidney disease), stage III (HCC) 01/11/2014  . Hyperlipidemia associated with type 2 diabetes mellitus (HCC) 09/14/2013  . Hypertension associated with diabetes (HCC) 10/31/2011  . Diabetes mellitus (HCC) 10/31/2011    Kermit Balo, PTA 03/12/18 9:13 AM   [facility] [address]  [address] [place], [place], [address] Phone: [phone]   Fax:  [phone]  Name: Olivia Morales MRN: [id number] Date of Birth: 08/02/1950

## 2018-03-14 ENCOUNTER — Ambulatory Visit: Payer: Medicare Other

## 2018-03-17 ENCOUNTER — Ambulatory Visit: Payer: Medicare Other | Admitting: Physical Therapy

## 2018-03-17 ENCOUNTER — Encounter: Payer: Self-pay | Admitting: Physical Therapy

## 2018-03-17 DIAGNOSIS — R262 Difficulty in walking, not elsewhere classified: Secondary | ICD-10-CM

## 2018-03-17 DIAGNOSIS — M25572 Pain in left ankle and joints of left foot: Secondary | ICD-10-CM | POA: Diagnosis not present

## 2018-03-17 DIAGNOSIS — M25672 Stiffness of left ankle, not elsewhere classified: Secondary | ICD-10-CM

## 2018-03-17 DIAGNOSIS — M6281 Muscle weakness (generalized): Secondary | ICD-10-CM

## 2018-03-17 NOTE — Therapy (Signed)
[facility] [address]  [address] [place], [place], [address] Phone: [phone]   Fax:  [phone]  Physical Therapy Treatment  Patient Details  Name: Olivia Morales MRN: [id number] Date of Birth: Jan 10, 1951 Referring Provider: Norton Blizzard, MD   Encounter Date: 03/17/2018  PT End of Session - 03/17/18 1023    Visit Number  5    Number of Visits  13    Date for PT Re-Evaluation  04/07/18    Authorization Type  UHC Medicare    PT Start Time  0807   patient arrived late and stepped out at beginning of appointment   PT Stop Time  0904    PT Time Calculation (min)  57 min    Activity Tolerance  Patient tolerated treatment well    Behavior During Therapy  [facility] for tasks assessed/performed       Past Medical History:  Diagnosis Date  . Diabetes mellitus   . Hypercholesteremia   . Hypertension   . Sleep apnea     Past Surgical History:  Procedure Laterality Date  . TUBAL LIGATION      There were no vitals filed for this visit.  Subjective Assessment - 03/17/18 0808    Subjective  Patient reporting that she has something wrong with her eye- requesting to step out to [place] to ask about it because "I don't want anyone to catch it." I had a cyst that was oozing on my eye but feels a lot better today. Will be seeing MD across the hall after this appointment. L foot has been throbbing and aching a lot.     Pertinent History  HTN, HLD, DM    Diagnostic tests  02/18/18 MSK u/s left ankle: Excellent callus formation over distal fibula fracture; 01/03/18 L ankle xray: Left fibular fracture as above with apparent partial interval healing    Patient Stated Goals  walking and physical exercise    Currently in Pain?  Yes    Pain Score  0-No pain    Pain Location  Ankle    Pain Descriptors / Indicators  Aching;Throbbing    Pain Type  Chronic pain                       [facility] Adult PT Treatment/Exercise  - 03/17/18 0001      Vasopneumatic   Number Minutes Vasopneumatic   15 minutes    Vasopnuematic Location   Ankle   L   Vasopneumatic Pressure  Medium    Vasopneumatic Temperature   coldest temp      Ankle Exercises: Aerobic   Nustep  Lvl 4, 6 min       Ankle Exercises: Standing   SLS  L SLS on firm and foam surface with EO/EC with CGA x 3 min    Heel Raises  Both;10 reps;Limitations   eccentric at UBE   Side Shuffle (Round Trip)  anterior step up on foam x10 with CGA    Other Standing Ankle Exercises  anterior step up 6" and 8" without UE support and with CGA 10x each   mild L ant ankle pain   Other Standing Ankle Exercises  Standing hip adduction/abduction with red TB around toes 10x each LE each direction       Ankle Exercises: Stretches   Gastroc Stretch  30 seconds;2 reps   lunge against wall            PT Education -  03/17/18 1023    Education Details  advised patient to progress SLS at counter top with EC; fiance with support as needed    Person(s) Educated  Patient    Methods  Explanation;Demonstration;Tactile cues;Verbal cues;Handout    Comprehension  Returned demonstration;Verbalized understanding       PT Short Term Goals - 03/12/18 0909      PT SHORT TERM GOAL #1   Title  Patient to be independent with initial HEP.    Time  3    Period  Weeks    Status  Achieved        PT Long Term Goals - 03/05/18 1024      PT LONG TERM GOAL #1   Title  Patient to be independent with advanced HEP.    Time  6    Period  Weeks    Status  On-going      PT LONG TERM GOAL #2   Title  [facility] to demonstrate [facility] and pain-free L ankle AROM/PROM.    Time  6    Period  Weeks    Status  On-going      PT LONG TERM GOAL #3   Title  Patient to demonstrate B LE strength >=4+/5.    Time  6    Period  Weeks    Status  On-going      PT LONG TERM GOAL #4   Title  Patient to demonstrate each LE SLS for 10 sec without LOB.    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     Time  6    Period  Weeks    Status  On-going            Plan - 03/17/18 1026    Clinical Impression Statement  Patient arrived with c/o R eye crusting/pain at beginning of session, requesting to step out to speak with [place] MD with across the hall. Patient also reporting increased pain in L foot. Advised patient to avoid barefoot and slipper walking, and spend more time in supportive shoe at this time. Patient agreeable. Patient reporting difficulty with SLS, however great performance on L LE both on firm and foam surface. Progressed SLS on firm surface with EC- increased M/L sway with this activity however able to self-correct with use of intermittent UE use on counter. Added this progression to HEP. Patient with great control with L LE anterior step up today, reporting mild anterior ankle pain after several reps. Finished session with [place] to L foot for pain and edema. Normal integumentary response apparent at end of session.     PT Treatment/Interventions  ADLs/Self Care Home Management;Cryotherapy;Electrical Stimulation;Iontophoresis 4mg /ml Dexamethasone;Moist Heat;Ultrasound;DME Instruction;Gait training;Stair training;Functional mobility training;Therapeutic activities;Therapeutic exercise;Manual techniques;Orthotic Fit/Training;Patient/family education;Neuromuscular re-education;Balance training;Scar mobilization;Passive range of motion;Dry needling;Energy conservation;Splinting;Taping;Vasopneumatic Device    Consulted and Agree with Plan of Care  Patient       Patient will benefit from skilled therapeutic intervention in order to improve the following deficits and impairments:  Hypomobility, Increased edema, Decreased activity tolerance, Decreased strength, Pain, Decreased balance, Difficulty walking, Decreased range of motion, Impaired  flexibility  Visit Diagnosis: Pain in left ankle and joints of left foot  Stiffness of left ankle, not elsewhere classified  Difficulty in walking, not elsewhere classified  Muscle weakness (generalized)     Problem List Patient Active Problem List   Diagnosis Date Noted  . Left ankle injury, subsequent encounter 12/19/2017  . Class 2 severe obesity due to excess calories with serious comorbidity and body mass index (BMI) of 35.0 to 35.9 in adult (HCC) 03/05/2017  . 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 Nocturia more than twice per night 02/08/2016  . Noncompliance with CPAP treatment 02/08/2016  . CKD (chronic kidney disease), stage III (HCC) 01/11/2014  . Hyperlipidemia associated with type 2 diabetes mellitus (HCC) 09/14/2013  . Hypertension associated with diabetes (HCC) 10/31/2011  . Diabetes mellitus (HCC) 10/31/2011    Anette Guarneri, PT, DPT 03/17/18 10:38 AM    [facility] [address]  [address] [place], [place], [address] Phone: [phone]   Fax:  [phone]  Name: Olivia Morales MRN: [id number] Date of Birth: [birth year]

## 2018-03-19 ENCOUNTER — Ambulatory Visit: Payer: Medicare Other | Admitting: Physical Therapy

## 2018-03-19 DIAGNOSIS — M25672 Stiffness of left ankle, not elsewhere classified: Secondary | ICD-10-CM

## 2018-03-19 DIAGNOSIS — M6281 Muscle weakness (generalized): Secondary | ICD-10-CM

## 2018-03-19 DIAGNOSIS — M25572 Pain in left ankle and joints of left foot: Secondary | ICD-10-CM | POA: Diagnosis not present

## 2018-03-19 DIAGNOSIS — R262 Difficulty in walking, not elsewhere classified: Secondary | ICD-10-CM

## 2018-03-19 NOTE — Therapy (Signed)
[facility] [address]  [place], [place], [address] Phone: [phone]   Fax:  [phone]  Physical Therapy Treatment  Patient Details  Name: Olivia Morales MRN: [id number] Date of Birth: 1951-03-21 Referring Provider: Karlton Lemon, MD   Encounter Date: 03/19/2018  PT End of Session - 03/19/18 1109    Visit Number  6    Number of Visits  13    Date for PT Re-Evaluation  04/07/18    Authorization Type  UHC Medicare    PT Start Time  1106    PT Stop Time  1206    PT Time Calculation (min)  60 min    Activity Tolerance  Patient tolerated treatment well;No increased pain    Behavior During Therapy  [facility] for tasks assessed/performed       Past Medical History:  Diagnosis Date  . Diabetes mellitus   . Hypercholesteremia   . Hypertension   . Sleep apnea     Past Surgical History:  Procedure Laterality Date  . TUBAL LIGATION      There were no vitals filed for this visit.  Subjective Assessment - 03/19/18 1109    Subjective  Pt reports her ankle has been hurting a little more since last visit.  She was feeling great prior to this and has been doing more walking.  "I've been probably doing too much".     Patient Stated Goals  walking and physical exercise    Currently in Pain?  Yes    Pain Score  8     Pain Location  Ankle    Pain Orientation  Left;Anterior    Pain Descriptors / Indicators  Throbbing;Sharp    Aggravating Factors   walking too much     Pain Relieving Factors  medicine         [facility] PT Assessment - 03/19/18 0001      Assessment   Medical Diagnosis  L ankle injury. subsequent encounter    Referring Provider  Karlton Lemon, MD    Onset Date/Surgical Date  12/09/17    Next MD Visit  04/01/18      AROM   Left Ankle Dorsiflexion  6    Left Ankle Plantar Flexion  50    Left Ankle Inversion  32    Left Ankle Eversion  18      Strength   Left Ankle Dorsiflexion  4+/5    Left  Ankle Inversion  5/5    Left Ankle Eversion  --   5-/5      [facility] Adult PT Treatment/Exercise - 03/19/18 0001      Vasopneumatic   Number Minutes Vasopneumatic   15 minutes    Vasopnuematic Location   Ankle   L   Vasopneumatic Pressure  Medium    Vasopneumatic Temperature   34 deg       Manual Therapy   Manual Therapy  Taping    Manual therapy comments  I strip of reg KT tape applied to Lt lateral dorsum of foot to Achilles, strip applied to peroneus tertius; strip applied to medial Lt ankle, wrapping under arm and crossing over talus.  All applied with 10-15% stretch to assist in decrease in edema and increase in proprioception.        Ankle Exercises: Seated   Ankle Circles/Pumps  Left;10 reps    Heel Raises  Both;10 reps    Toe Raise  10 reps    BAPS  Sitting;Level 3;10 reps    BAPS Limitations  front/back, R/L, CW, CCW     Other Seated Ankle Exercises  Lt ankle eversion with green band x 10 reps, 2 sets      Ankle Exercises: Aerobic   Nustep  L 4: 6 min    PTA present to discuss progress and monitor     Ankle Exercises: Supine   T-Band  red band for Lt ankle inversion and eversion; green band for DF - 2 sets of 10 each      Ankle Exercises: Stretches   Soleus Stretch  3 reps;30 seconds   each foot   Gastroc Stretch  3 reps;30 seconds   each foot            PT Education - 03/19/18 1212    Education Details  ktape info;  HEP - added ankle resistance exercises, issued red band     Person(s) Educated  Patient    Methods  Explanation;Demonstration;Tactile cues;Verbal cues;Handout    Comprehension  Verbalized understanding       PT Short Term Goals - 03/12/18 0909      PT SHORT TERM GOAL #1   Title  Patient to be independent with initial HEP.    Time  3    Period  Weeks    Status  Achieved        PT Long Term Goals - 03/19/18 1159      PT LONG TERM GOAL #1   Title  Patient to be independent with advanced HEP.    Time  6    Period  Weeks    Status   On-going      PT LONG TERM GOAL #2   Title  Patient to demonstrate [facility] and pain-free L ankle AROM/PROM.    Time  6    Period  Weeks    Status  Partially Met      PT LONG TERM GOAL #3   Title  Patient to demonstrate B LE strength >=4+/5.    Time  6    Period  Weeks    Status  Partially Met      PT LONG TERM GOAL #4   Title  Patient to demonstrate each LE SLS for 10 sec without LOB.    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     Time  6    Period  Weeks    Status  On-going            Plan - 03/19/18 1123    Clinical Impression Statement  Pt reported reduction of pain with seated therapeutic exercise (decreased by 3 points).  Further reduction reported with use of vaso at end of session.  Encouraged pt to back off SLS exercises and complete ankle stretches/AROM throughout day to decrease discomfort.  Her Lt ankle ROM and strength have improved.   Pt has partially met LTG #2 and 3, and is progressing well towards remaining goals.     Rehab Potential  Good    PT Frequency  2x / week    PT Duration  6 weeks    PT Treatment/Interventions  ADLs/Self Care Home Management;Cryotherapy;Electrical Stimulation;Iontophoresis 11m/ml Dexamethasone;Moist Heat;Ultrasound;DME Instruction;Gait training;Stair training;Functional mobility training;Therapeutic activities;Therapeutic exercise;Manual techniques;Orthotic Fit/Training;Patient/family education;Neuromuscular re-education;Balance training;Scar mobilization;Passive  range of motion;Dry needling;Energy conservation;Splinting;Taping;Vasopneumatic Device    PT Next Visit Plan  assess response to Kta[employer] and re-apply if needed.     Consulted and Agree with Plan of Care  Patient       Patient will benefit from skilled therapeutic intervention in order to improve the following deficits and impairments:  Hypomobility,  Increased edema, Decreased activity tolerance, Decreased strength, Pain, Decreased balance, Difficulty walking, Decreased range of motion, Impaired flexibility  Visit Diagnosis: Pain in left ankle and joints of left foot  Stiffness of left ankle, not elsewhere classified  Difficulty in walking, not elsewhere classified  Muscle weakness (generalized)     Problem List Patient Active Problem List   Diagnosis Date Noted  . Left ankle injury, subsequent encounter 12/19/2017  . Class 2 severe obesity due to excess calories with serious comorbidity and body mass index (BMI) of 35.0 to 35.9 in adult ([place]) 03/05/2017  . 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 Nocturia more than twice per night 02/08/2016  . Noncompliance with CPAP treatment 02/08/2016  . CKD (chronic kidney disease), stage III ([place]) 01/11/2014  . Hyperlipidemia associated with type 2 diabetes mellitus ([place]) 09/14/2013  . Hypertension associated with diabetes ([place]) 10/31/2011  . Diabetes mellitus ([place]) 10/31/2011   Kerin Perna, PTA 03/19/18 12:31 PM  [facility] [address]  [place], [place], [address] Phone: [phone]   Fax:  [phone]  Name: Olivia Morales MRN: [id number] Date of Birth: April 13, 1951

## 2018-03-19 NOTE — Patient Instructions (Addendum)
Kinesiology tape What is kinesiology tape?  There are many brands of kinesiology tape.  KTape, [employer]ape, [employer] Sport, Dynamic tape, to name a few. It is an elasticized tape designed to support the body's natural healing process. This tape provides stability and support to muscles and joints without restricting motion. It can also help decrease swelling in the area of application. How does it work? The tape microscopically lifts and decompresses the skin to allow for drainage of lymph (swelling) to flow away from area, reducing inflammation.  The tape has the ability to help re-educate the neuromuscular system by targeting specific receptors in the skin.  The presence of the tape increases the body's awareness of posture and body mechanics.  Do not use with: . Open wounds . Skin lesions . Adhesive allergies Safe removal of the tape: In some rare cases, mild/moderate skin irritation can occur.  This can include redness, itchiness, or hives. If this occurs, immediately remove tape and consult your primary care physician if symptoms are severe or do not resolve within 2 days.  To remove tape safely, hold nearby skin with one hand and gentle roll tape down with other hand.  You can apply oil or conditioner to tape while in shower prior to removal to loosen adhesive.  DO NOT swiftly rip tape off like a band-aid, as this could cause skin tears and additional skin irritation.   ANKLE: Eversion, Unilateral (Band)    Place band around left foot. Keeping heel in place, raise toes of banded foot up and away from body. Do not move hip. Hold _2__ seconds. Use ___red_____ band. _10__ reps per set, _1-2__ sets per day.  Inversion: Resisted   * with band on couch leg, place foot inside band and pull towards inward.  Repeat __10__ times per set. Do _1-2___ sets per session. Do ____ sessions per day.  Dorsiflexion: Resisted   Facing anchor, tubing around left foot, pull toward face.  Repeat _10___ times  per set. Do __1-2__ sets per session.

## 2018-03-20 ENCOUNTER — Ambulatory Visit: Payer: Medicare Other

## 2018-03-24 ENCOUNTER — Ambulatory Visit: Payer: Medicare Other

## 2018-03-24 DIAGNOSIS — M25572 Pain in left ankle and joints of left foot: Secondary | ICD-10-CM | POA: Diagnosis not present

## 2018-03-24 DIAGNOSIS — M25672 Stiffness of left ankle, not elsewhere classified: Secondary | ICD-10-CM

## 2018-03-24 DIAGNOSIS — M6281 Muscle weakness (generalized): Secondary | ICD-10-CM

## 2018-03-24 DIAGNOSIS — R262 Difficulty in walking, not elsewhere classified: Secondary | ICD-10-CM

## 2018-03-24 NOTE — Therapy (Signed)
[facility] [address]  [place], [place], [address] Phone: [phone]   Fax:  [phone]  Physical Therapy Treatment  Patient Details  Name: Olivia Morales MRN: [id number] Date of Birth: February 23, 1951 Referring Provider: Karlton Lemon, MD   Encounter Date: 03/24/2018  PT End of Session - 03/24/18 0806    Visit Number  7    Number of Visits  13    Date for PT Re-Evaluation  04/07/18    Authorization Type  UHC Medicare    PT Start Time  0800    PT Stop Time  0839    PT Time Calculation (min)  39 min    Activity Tolerance  Patient tolerated treatment well;No increased pain    Behavior During Therapy  WFL for tasks assessed/performed       Past Medical History:  Diagnosis Date  . Diabetes mellitus   . Hypercholesteremia   . Hypertension   . Sleep apnea     Past Surgical History:  Procedure Laterality Date  . TUBAL LIGATION      There were no vitals filed for this visit.  Subjective Assessment - 03/24/18 0805    Subjective  Pt. doing well today and notes benefit from taping last visit.      Pertinent History  HTN, HLD, DM    Diagnostic tests  02/18/18 MSK u/s left ankle: Excellent callus formation over distal fibula fracture; 01/03/18 L ankle xray: Left fibular fracture as above with apparent partial interval healing    Patient Stated Goals  walking and physical exercise    Currently in Pain?  No/denies    Pain Score  0-No pain   up to 6/10 at worst while laying in bed last night   Pain Location  Ankle    Pain Orientation  Left;Anterior    Pain Descriptors / Indicators  Throbbing;Sharp    Pain Type  Chronic pain    Aggravating Factors   Unsure     Multiple Pain Sites  No                       [facility] Adult PT Treatment/Exercise - 03/24/18 0819      Manual Therapy   Manual therapy comments  I strip of reg KT tape applied to Lt lateral dorsum of foot to Achilles, strip applied  to peroneus tertius; strip applied to medial Lt ankle, wrapping under arm and crossing over talus.  All applied with 10-15% stretch to assist in decrease in edema and increase in proprioception.        Ankle Exercises: Aerobic   Nustep  L 4: 6 min       Ankle Exercises: Standing   SLS  L SLS on foam 3 x 10 sec with one ski poles     Heel Raises  Both;15 reps;3 seconds    Heel Walk (Round Trip)  2 x 15 ft at wall    Side Shuffle (Round Trip)  Side stepping with red TB at forefoot 2 x 20 ft     Other Standing Ankle Exercises  L SLS with one ski pole with opposite LE toe touch to cones 2 x 30 sec       Ankle Exercises: Stretches   Soleus Stretch  3 reps;30 seconds    Gastroc Stretch  3 reps;30 seconds   into wall in runners stretch and + great toe extension stret  PT Education - 03/24/18 1243    Education Details  HEP update    Person(s) Educated  Patient    Methods  Explanation;Demonstration;Handout;Verbal cues    Comprehension  Verbalized understanding;Returned demonstration;Verbal cues required;Need further instruction       PT Short Term Goals - 03/12/18 0909      PT SHORT TERM GOAL #1   Title  Patient to be independent with initial HEP.    Time  3    Period  Weeks    Status  Achieved        PT Long Term Goals - 03/19/18 1159      PT LONG TERM GOAL #1   Title  Patient to be independent with advanced HEP.    Time  6    Period  Weeks    Status  On-going      PT LONG TERM GOAL #2   Title  Patient to demonstrate [facility] and pain-free L ankle AROM/PROM.    Time  6    Period  Weeks    Status  Partially Met      PT LONG TERM GOAL #3   Title  Patient to demonstrate B LE strength >=4+/5.    Time  6    Period  Weeks    Status  Partially Met      PT LONG TERM GOAL #4   Title  Patient to demonstrate each LE SLS for 10 sec without LOB.    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     Time  6    Period  Weeks    Status  On-going            Plan - 03/24/18 0806    Clinical Impression Statement  Kareemah reporting good relief from L ankle swelling and added support from taping applied last visit thus reapplied taping today.  Tolerated progression of SLS on foam today and increased distance with band-resisted sidestepping.  Seems to be progressing well toward goals.      PT Treatment/Interventions  ADLs/Self Care Home Management;Cryotherapy;Electrical Stimulation;Iontophoresis 69m/ml Dexamethasone;Moist Heat;Ultrasound;DME Instruction;Gait training;Stair training;Functional mobility training;Therapeutic activities;Therapeutic exercise;Manual techniques;Orthotic Fit/Training;Patient/family education;Neuromuscular re-education;Balance training;Scar mobilization;Passive range of motion;Dry needling;Energy conservation;Splinting;Taping;Vasopneumatic Device    Consulted and Agree with Plan of Care  Patient       Patient will benefit from skilled therapeutic intervention in order to improve the following deficits and impairments:  Hypomobility, Increased edema, Decreased activity tolerance, Decreased strength, Pain, Decreased balance, Difficulty walking, Decreased range of motion, Impaired flexibility  Visit Diagnosis: Pain in left ankle and joints of left foot  Stiffness of left ankle, not elsewhere classified  Difficulty in walking, not elsewhere classified  Muscle weakness (generalized)     Problem List Patient Active Problem List   Diagnosis Date Noted  . Left ankle injury, subsequent encounter 12/19/2017  . Class 2 severe obesity due to excess calories with serious comorbidity and body mass index (BMI) of 35.0 to 35.9 in adult ([place] 03/05/2017  . 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 Nocturia more than twice per night 02/08/2016  . Noncompliance with CPAP treatment 02/08/2016  . CKD (chronic  kidney disease), stage III ([facility] 01/11/2014  . Hyperlipidemia associated with type 2 diabetes mellitus ([place] 09/14/2013  .  Hypertension associated with diabetes ([place]) 10/31/2011  . Diabetes mellitus ([place]) 10/31/2011    Bess Harvest, PTA 03/24/18 12:43 PM   [facility] [address]  [place], [place], [address] Phone: [phone]   Fax:  [phone]  Name: Burnice Vassel MRN: [id number] Date of Birth: [birth year]

## 2018-03-25 ENCOUNTER — Other Ambulatory Visit: Payer: Self-pay

## 2018-03-25 MED ORDER — BLOOD GLUCOSE METER KIT: PACK | 0 refills | Status: DC

## 2018-03-25 MED ORDER — DULAGLUTIDE 0.75 MG/0.5ML ~~LOC~~ SOAJ: SUBCUTANEOUS | 0 refills | Status: DC

## 2018-03-26 ENCOUNTER — Ambulatory Visit: Payer: Medicare Other | Admitting: Physical Therapy

## 2018-03-26 ENCOUNTER — Other Ambulatory Visit: Payer: Self-pay | Admitting: Medical

## 2018-03-26 MED ORDER — DULAGLUTIDE 0.75 MG/0.5ML ~~LOC~~ SOAJ: 0.7500 mg | SUBCUTANEOUS | 3 refills | Status: DC

## 2018-03-26 MED ORDER — BLOOD GLUCOSE METER KIT: PACK | 0 refills | Status: DC

## 2018-03-26 NOTE — Telephone Encounter (Signed)
Copied from [place] [id number]. Topic: Quick Communication - Rx Refill/Question >> Mar 26, 2018  3:14 PM Judyann Munson wrote: Medication: Dulaglutide (TRULICITY) 8.14 GY/1.8HU SOPN, blood glucose meter kit and supplies  Has the patient contacted their pharmacy? Yes   Preferred Pharmacy (with phone number or street name): [id number] - [place], [place] - [address] AT [place] [phone] (Phone) [phone] (Fax)   Agent: Please be advised that RX refills may take up to 3 business days. We ask that you follow-up with your pharmacy.   Patient is requesting this medication RX be sent to [facility] instead of waiting for the mail service pharmacy

## 2018-03-26 NOTE — Telephone Encounter (Signed)
Rx faxed to [facility]

## 2018-03-26 NOTE — Telephone Encounter (Signed)
Pt called stating that her blood glucose meter kit and supplies went to wrong pharmacy. Pt requested it be sent to.   [id number] - [place], [place] - [address] AT [place] [phone] (Phone) [phone] (Fax)

## 2018-03-26 NOTE — Telephone Encounter (Signed)
Rx sent 

## 2018-03-26 NOTE — Addendum Note (Signed)
Addended byConrad [place]: Fermin Yan D on: 03/26/2018 04:41 PM   Modules accepted: Orders

## 2018-03-31 ENCOUNTER — Ambulatory Visit: Payer: Medicare Other

## 2018-03-31 DIAGNOSIS — M25572 Pain in left ankle and joints of left foot: Secondary | ICD-10-CM

## 2018-03-31 DIAGNOSIS — M6281 Muscle weakness (generalized): Secondary | ICD-10-CM

## 2018-03-31 DIAGNOSIS — M25672 Stiffness of left ankle, not elsewhere classified: Secondary | ICD-10-CM

## 2018-03-31 DIAGNOSIS — R262 Difficulty in walking, not elsewhere classified: Secondary | ICD-10-CM

## 2018-03-31 NOTE — Therapy (Addendum)
[facility] [address]  [place], [place], [address] Phone: [phone]   Fax:  [phone]  Physical Therapy Treatment  Patient Details  Name: Olivia Morales MRN: [id number] Date of Birth: 04-26-1951 Referring Provider (PT): Karlton Lemon, MD   Encounter Date: 03/31/2018  PT End of Session - 03/31/18 0851    Visit Number  8    Number of Visits  13    Date for PT Re-Evaluation  04/07/18    Authorization Type  UHC Medicare    PT Start Time  0846    PT Stop Time  0930    PT Time Calculation (min)  44 min    Activity Tolerance  Patient tolerated treatment well;No increased pain    Behavior During Therapy  WFL for tasks assessed/performed       Past Medical History:  Diagnosis Date  . Diabetes mellitus   . Hypercholesteremia   . Hypertension   . Sleep apnea     Past Surgical History:  Procedure Laterality Date  . TUBAL LIGATION      There were no vitals filed for this visit.  Subjective Assessment - 03/31/18 0849    Subjective  Pt. reporting some soreness last week without known trigger.      Pertinent History  HTN, HLD, DM    Diagnostic tests  02/18/18 MSK u/s left ankle: Excellent callus formation over distal fibula fracture; 01/03/18 L ankle xray: Left fibular fracture as above with apparent partial interval healing    Patient Stated Goals  walking and physical exercise    Currently in Pain?  No/denies    Pain Score  0-No pain    Multiple Pain Sites  No         [facility] PT Assessment - 03/31/18 0907      AROM   Right/Left Ankle  Left    Left Ankle Dorsiflexion  8    Left Ankle Plantar Flexion  53    Left Ankle Inversion  32    Left Ankle Eversion  20      PROM   PROM Assessment Site  Ankle    Right/Left Ankle  Left    Left Ankle Dorsiflexion  10    Left Ankle Plantar Flexion  54    Left Ankle Inversion  38    Left Ankle Eversion  21      Strength   Right/Left Hip  Right;Left     Right Hip Flexion  4+/5    Right Hip ABduction  4/5    Right Hip ADduction  4+/5    Left Hip Flexion  4/5    Left Hip ABduction  4+/5    Left Hip ADduction  4+/5    Right/Left Knee  Right;Left    Right Knee Flexion  5/5    Right Knee Extension  5/5    Left Knee Flexion  4+/5    Left Knee Extension  5/5    Right/Left Ankle  Right;Left    Right Ankle Dorsiflexion  5/5    Right Ankle Plantar Flexion  4+/5    Right Ankle Inversion  4+/5    Right Ankle Eversion  5/5    Left Ankle Dorsiflexion  5/5    Left Ankle Plantar Flexion  4+/5    Left Ankle Inversion  5/5    Left Ankle Eversion  5/5  [facility] Adult PT Treatment/Exercise - 03/31/18 0859      Ankle Exercises: Seated   Other Seated Ankle Exercises  L DF, EV, IV with red TB x 15 reps      Ankle Exercises: Aerobic   Recumbent Bike  Lvl 2, 6 min       Ankle Exercises: Standing   SLS  L SLS on foam 3 x 15 sec     Heel Raises  Right;Left;10 reps    Heel Raises Limitations  B single leg heel raise     Other Standing Ankle Exercises  Standing alternating hip abduction with yellow TB at ankles x 10 resp     Other Standing Ankle Exercises  Standing hip flexion with yellow TB at forefoot x 10 reps       Ankle Exercises: Stretches   Soleus Stretch  3 reps;30 seconds    Gastroc Stretch  3 reps;30 seconds             PT Education - 03/31/18 1245    Education Details  HEP update    Person(s) Educated  Patient    Methods  Explanation;Demonstration;Verbal cues    Comprehension  Verbalized understanding;Returned demonstration;Verbal cues required;Need further instruction       PT Short Term Goals - 03/12/18 0909      PT SHORT TERM GOAL #1   Title  Patient to be independent with initial HEP.    Time  3    Period  Weeks    Status  Achieved        PT Long Term Goals - 03/31/18 0857      PT LONG TERM GOAL #1   Title  Patient to be independent with advanced HEP.    Time  6    Period  Weeks     Status  Partially Met      PT LONG TERM GOAL #2   Title  Patient to demonstrate [facility] and pain-free L ankle AROM/PROM.    Time  6    Period  Weeks    Status  Partially Met      PT LONG TERM GOAL #3   Title  Patient to demonstrate B LE strength >=4+/5.    Time  6    Period  Weeks    Status  Partially Met      PT LONG TERM GOAL #4   Title  Patient to demonstrate each LE SLS for 10 sec without LOB.    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     Time  6    Period  Weeks    Status  On-going            Plan - 03/31/18 3734    Clinical Impression Statement  Pt. making good progress with therapy.  Able to partially meet LE strength goal with MMT today only L hip flexion and R hip abd now 4/5.  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  Good improvement in L ankle PROM/AROM now partially meeting goal, primarily limited in DF ROM however some improvement seen in this motion today.  Pt. encouraged to regularly perform calf stretching, side stepping and updated  hip flexion band resisted activities with HEP as to target remaining strength and ROM deficits.  Will continue to progress toward goals.      PT Treatment/Interventions  ADLs/Self Care Home Management;Cryotherapy;Electrical Stimulation;Iontophoresis 74m/ml Dexamethasone;Moist Heat;Ultrasound;DME Instruction;Gait training;Stair training;Functional mobility training;Therapeutic activities;Therapeutic exercise;Manual techniques;Orthotic Fit/Training;Patient/family education;Neuromuscular re-education;Balance training;Scar mobilization;Passive range of motion;Dry needling;Energy conservation;Splinting;Taping;Vasopneumatic Device    Consulted and Agree with Plan of Care  Patient       Patient will benefit from  skilled therapeutic intervention in order to improve the following deficits and impairments:  Hypomobility, Increased edema, Decreased activity tolerance, Decreased strength, Pain, Decreased balance, Difficulty walking, Decreased range of motion, Impaired flexibility  Visit Diagnosis: Pain in left ankle and joints of left foot  Stiffness of left ankle, not elsewhere classified  Difficulty in walking, not elsewhere classified  Muscle weakness (generalized)     Problem List Patient Active Problem List   Diagnosis Date Noted  . Left ankle injury, subsequent encounter 12/19/2017  . Class 2 severe obesity due to excess calories with serious comorbidity and body mass index (BMI) of 35.0 to 35.9 in adult ([facility] 03/05/2017  . 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 Nocturia more than twice per night 02/08/2016  . Noncompliance with CPAP treatment 02/08/2016  . CKD (chronic kidney disease), stage III ([place] 01/11/2014  . Hyperlipidemia associated with type 2 diabetes mellitus ([facility] 09/14/2013  . Hypertension associated with diabetes ([facility] [place] 10/31/2011  . Diabetes mellitus ([facility] 10/31/2011    MBess Harvest PTA 03/31/18 12:46 PM   [facility] [address] [place] [address]: [phone]  Fax:  [phone] Name: Olivia GassMRN: [id number] of Birth: 210-Oct-1952

## 2018-04-01 ENCOUNTER — Ambulatory Visit: Payer: Medicare Other | Admitting: Medical

## 2018-04-01 ENCOUNTER — Encounter: Payer: Self-pay | Admitting: Medical

## 2018-04-01 ENCOUNTER — Ambulatory Visit: Payer: Medicare Other | Admitting: Family Medicine

## 2018-04-01 ENCOUNTER — Encounter: Payer: Self-pay | Admitting: Family Medicine

## 2018-04-01 VITALS — BP 130/79 | HR 55 | Ht 64.0 in | Wt 191.0 lb

## 2018-04-01 VITALS — BP 138/80 | HR 58 | Temp 97.7°F | Resp 16 | Ht 64.0 in | Wt 186.2 lb

## 2018-04-01 DIAGNOSIS — E1165 Type 2 diabetes mellitus with hyperglycemia: Secondary | ICD-10-CM

## 2018-04-01 DIAGNOSIS — E1159 Type 2 diabetes mellitus with other circulatory complications: Secondary | ICD-10-CM | POA: Diagnosis not present

## 2018-04-01 DIAGNOSIS — I10 Essential (primary) hypertension: Secondary | ICD-10-CM | POA: Diagnosis not present

## 2018-04-01 DIAGNOSIS — Z23 Encounter for immunization: Secondary | ICD-10-CM | POA: Diagnosis not present

## 2018-04-01 DIAGNOSIS — S99912D Unspecified injury of left ankle, subsequent encounter: Secondary | ICD-10-CM | POA: Diagnosis not present

## 2018-04-01 DIAGNOSIS — N183 Chronic kidney disease, stage 3 unspecified: Secondary | ICD-10-CM

## 2018-04-01 NOTE — Patient Instructions (Signed)
For your history of diabetes, continue with Trulicity weekly and start checking your blood sugars daily 1-2 times.  Recommend fasting and check and check at least one time after a meal.  This will help you get understanding on what makes your sugars spike.  Also referred you to diabetic educator.  Please make sure that when you get your vision checked that they do diabetic eye exam  For elevated blood pressure, I want you to start checking your blood pressure daily and if your trend is closer to 140/90 then will need to make BP medication adjustments.  But also want you to schedule a future repeat CMP.  Make sure that you stay well-hydrated the day before and day of lab.  Want to get accurate measure of creatinine and GFR when you are hydrated.  Follow-up in 4 to 5 weeks or as needed.

## 2018-04-01 NOTE — Progress Notes (Signed)
PCP: Mackie Pai, PA-C  Subjective:   HPI: Patient is a 67 y.o. female here for left ankle injury.  6/18: Patient reports on 6/10 she accidentally fell and believes she inverted her left ankle. Immediate pain and swelling primarily lateral left ankle. Pain continues to be 5/10 with up to 10/10 level sharp pain at times. She is wearing cam walker and tolerating this. Interested in using a knee scooter. No prior injuries to this ankle. No other skin changes, numbness  7/5: Patient reports she's doing well. Takes ibuprofen during day with oxycodone in evenings if needed. Pain currently is 0/10 in the boot. Wearing cam walker when ambulating. Using a cane. Unfortunately her insurance denied knee scooter - she's doing to look into this and try other medical supply stores. + swelling but no skin changes.  7/24: Patient continues to do well.  She reports being pain-free the majority the time.  However, she occasionally gets sharp lateral left ankle pain with no identified provoking factors.  She continues to wear her cam walker the majority of the day.  She removes it when sitting, sleeping and also while standing and washing the dishes.  She continues to use a cane for assistance with ambulation.  She occasionally takes Motrin which has been moderately helpful.  She reports continued swelling which is worse at the end of day.  No other skin changes.  No paresthesias.  No erythema or bruising.  8/20: Patient reports overall she is doing well notes this morning she did wake up with fairly severe pain on the dorsal aspect of her ankle and foot in the plantar foot. She states she had to hop to the bathroom because of the pain. She was more active on Sunday at church and noticed that her left ankle and foot was very swollen.   She is using the boot more intermittently now. Pain is up to 8 out of 10 and sharp. She has been taking ibuprofen or Tylenol if needed. No skin changes, new  injuries.  10/1: Patient reports she's doing well. Pain level 0/10 but was bad this Saturday when she'd done a lot of standing and walking, more than usual. 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 Has done well with therapy and home exercises. Not taking anything for pain currently. No skin changes.  Past Medical History:  Diagnosis Date  . Diabetes mellitus   . Hypercholesteremia   . Hypertension   . Sleep apnea     Current Outpatient Medications on File Prior to Visit  Medication Sig Dispense Refill  . amLODipine (NORVASC) 10 MG tablet Take 10 mg by mouth daily.     Marland Kitchen atorvastatin (LIPITOR) 40 MG tablet Take 40 mg by mouth daily.    . blood glucose meter kit and supplies Check blood sugar 1-2 times a day (FOR ICD-E11.9). 1 each 0  . diphenhydramine-acetaminophen (TYLENOL PM) 25-500 MG TABS Take 2 tablets by mouth every 4 (four) hours as needed (pain).    . Dulaglutide (TRULICITY) 1.06 YI/9.4WN SOPN Inject 0.75 mg into the skin once a week. 4 pen 3  . glipiZIDE (GLUCOTROL XL) 10 MG 24 hr tablet TAKE 1 TABLET BY MOUTH  DAILY    . hydrochlorothiazide (MICROZIDE) 12.5 MG capsule Take 1 capsule by mouth daily.    Marland Kitchen losartan (COZAAR) 25 MG tablet Take 25 mg by mouth daily.      . metoprolol succinate (TOPROL-XL) 50 MG  24 hr tablet Take 1 tablet by mouth daily.    Marland Kitchen omeprazole (PRILOSEC) 20 MG capsule Take 20 mg by mouth daily.    Marland Kitchen PRESCRIPTION MEDICATION Place 1 drop into both eyes daily. For dry eye and allergies.     No current facility-administered medications on file prior to visit.     Past Surgical History:  Procedure Laterality Date  . TUBAL LIGATION      Allergies  Allergen Reactions  . Ace Inhibitors     Cough    Social History   Socioeconomic History  . Marital status: Widowed    Spouse name: Not on file  . Number of children: Not on file  . Years of education: Not on file  . Highest education level:  Not on file  Occupational History  . Not on file  Social Needs  . Financial resource strain: Not on file  . Food insecurity:    Worry: Not on file    Inability: Not on file  . Transportation needs:    Medical: Not on file    Non-medical: Not on file  Tobacco Use  . Smoking status: Never Smoker  . Smokeless tobacco: Never Used  Substance and Sexual Activity  . Alcohol use: Yes    Alcohol/week: 14.0 standard drinks    Types: 14 Cans of beer per week  . Drug use: No  . Sexual activity: Yes  Lifestyle  . Physical activity:    Days per week: Not on file    Minutes per session: Not on file  . Stress: Not on file  Relationships  . Social connections:    Talks on phone: Not on file    Gets together: Not on file    Attends religious service: Not on file    Active member of club or organization: Not on file    Attends meetings of clubs or organizations: Not on file    Relationship status: Not on file  . Intimate partner violence:    Fear of current or ex partner: Not on file    Emotionally abused: Not on file    Physically abused: Not on file    Forced sexual activity: Not on file  Other Topics Concern  . Not on file  Social History Narrative  . Not on file    Family History  Problem Relation Age of Onset  . Sickle cell anemia Father     BP 130/79   Pulse (!) 55   Ht '5\' 4"'  (1.626 m)   Wt 191 lb (86.6 kg)   BMI 32.79 kg/m   Review of Systems: See HPI above.     Objective:  Physical Exam:  Gen: NAD, comfortable in exam room  Left ankle: No gross deformity, swelling, ecchymoses FROM with 5/5 strength. TTP mildly anterior ankle joint, minimally distal fibula.  No other tenderness. Negative ant drawer and talar tilt.   Negative syndesmotic compression. Thompsons test negative. NV intact distally.  Assessment & Plan:  1. Left ankle injury - 2/2 distal fibula fracture.  Much improved following cam walker then physical therapy, home exercises.  Advised to go to  one more visit of PT then transition to home exercise program, put PT on hold.  Icing, tylenol, ibuprofen only if needed.  F/u prn.

## 2018-04-01 NOTE — Progress Notes (Signed)
Subjective:    Patient ID: Olivia Morales, female    DOB: 1951/01/08, 67 y.o.   MRN: [id number]  HPI  Pt in for follow up.   She is on trulicity one time a week. She was educated today on how to check blood sugar. Pt took trulicity today.(1st injection)  No hyper or hypoglycemic symptoms.  Pt wants referral to diabetic educator.   No cardiac or neurologic signs or symptoms.  Pt has appointment Nov 20,2019 for eye exam.   Pt has disability parking placard. She uses a cane for about 10 years. Hx of fracture left ankle fracture. Difficulty walking 200 ft without resting.(Patient request total and permanent disability. I don't expect her orthopedic condition to improves or her endurance in 5 years.   Review of Systems  Constitutional: Negative for chills, fatigue and fever.  HENT: Negative for congestion and drooling.   Respiratory: Negative for chest tightness, shortness of breath and wheezing.   Cardiovascular: Negative for chest pain and palpitations.  Gastrointestinal: Negative for abdominal pain.  Endocrine: Negative for polydipsia, polyphagia and polyuria.  Genitourinary: Negative for difficulty urinating, dysuria, flank pain and frequency.  Musculoskeletal: Negative for back pain, neck pain and neck stiffness.  Skin: Negative for rash.  Neurological: Negative for syncope, facial asymmetry and weakness.  Hematological: Negative for adenopathy. Does not bruise/bleed easily.  Psychiatric/Behavioral: Negative for behavioral problems and confusion.   Past Medical History:  Diagnosis Date  . Diabetes mellitus   . Hypercholesteremia   . Hypertension   . Sleep apnea      Social History   Socioeconomic History  . Marital status: Widowed    Spouse name: Not on file  . Number of children: Not on file  . Years of education: Not on file  . Highest education level: Not on file  Occupational History  . Not on file  Social Needs  . Financial resource strain: Not on  file  . Food insecurity:    Worry: Not on file    Inability: Not on file  . Transportation needs:    Medical: Not on file    Non-medical: Not on file  Tobacco Use  . Smoking status: Never Smoker  . Smokeless tobacco: Never Used  Substance and Sexual Activity  . Alcohol use: Yes    Alcohol/week: 14.0 standard drinks    Types: 14 Cans of beer per week  . Drug use: No  . Sexual activity: Yes  Lifestyle  . Physical activity:    Days per week: Not on file    Minutes per session: Not on file  . Stress: Not on file  Relationships  . Social connections:    Talks on phone: Not on file    Gets together: Not on file    Attends religious service: Not on file    Active member of club or organization: Not on file    Attends meetings of clubs or organizations: Not on file    Relationship status: Not on file  . Intimate partner violence:    Fear of current or ex partner: Not on file    Emotionally abused: Not on file    Physically abused: Not on file    Forced sexual activity: Not on file  Other Topics Concern  . Not on file  Social History Narrative  . Not on file    Past Surgical History:  Procedure Laterality Date  . TUBAL LIGATION      Family History  Problem Relation Age of  Onset  . Sickle cell anemia Father     Allergies  Allergen Reactions  . Ace Inhibitors     Cough    Current Outpatient Medications on File Prior to Visit  Medication Sig Dispense Refill  . amLODipine (NORVASC) 10 MG tablet Take 10 mg by mouth daily.     Marland Kitchen atorvastatin (LIPITOR) 40 MG tablet Take 40 mg by mouth daily.    . blood glucose meter kit and supplies Check blood sugar 1-2 times a day (FOR ICD-E11.9). 1 each 0  . diphenhydramine-acetaminophen (TYLENOL PM) 25-500 MG TABS Take 2 tablets by mouth every 4 (four) hours as needed (pain).    . Dulaglutide (TRULICITY) 6.37 CH/8.8FO SOPN Inject 0.75 mg into the skin once a week. 4 pen 3  . glipiZIDE (GLUCOTROL XL) 10 MG 24 hr tablet TAKE 1 TABLET  BY MOUTH  DAILY    . hydrochlorothiazide (MICROZIDE) 12.5 MG capsule Take 1 capsule by mouth daily.    . Lancets (ONETOUCH DELICA PLUS [id number]) MISC USE TO TEST 1-2 TIMES DAILY  0  . losartan (COZAAR) 25 MG tablet Take 25 mg by mouth daily.      . metoprolol succinate (TOPROL-XL) 50 MG 24 hr tablet Take 1 tablet by mouth daily.    Marland Kitchen omeprazole (PRILOSEC) 20 MG capsule Take 20 mg by mouth daily.    Glory Rosebush VERIO test strip TEST BG 1 TO 2 TIMES D  0  . PRESCRIPTION MEDICATION Place 1 drop into both eyes daily. For dry eye and allergies.     No current facility-administered medications on file prior to visit.     BP (!) 147/71   Pulse (!) 58   Temp 97.7 F (36.5 C) (Oral)   Resp 16   Ht '5\' 4"'$  (1.626 m)   Wt 186 lb 3.2 oz (84.5 kg)   SpO2 100%   BMI 31.96 kg/m        Objective:   Physical Exam  General Mental Status- Alert. General Appearance- Not in acute distress.   Skin General: Color- Normal Color. Moisture- Normal Moisture.  Neck Carotid Arteries- Normal color. Moisture- Normal Moisture. No carotid bruits. No JVD.  Chest and Lung Exam Auscultation: Breath Sounds:-Normal.  Cardiovascular Auscultation:Rythm- Regular. Murmurs & Other Heart Sounds:Auscultation of the heart reveals- No Murmurs.  Abdomen Inspection:-Inspeection Normal. Palpation/Percussion:Note:No mass. Palpation and Percussion of the abdomen reveal- Non Tender, Non Distended + BS, no rebound or guarding.  Neurologic Cranial Nerve exam:- CN III-XII intact(No nystagmus), symmetric smile. Strength:- 5/5 equal and symmetric strength both upper and lower extremities.      Assessment & Plan:  For your history of diabetes, continue with Trulicity weekly and start checking your blood sugars daily 1-2 times.  Recommend fasting and check and check at least one time after a meal.  This will help you get understanding on what makes your sugars spike.  Also referred you to diabetic educator.  Please make  sure that when you get your vision checked that they do diabetic eye exam  For elevated blood pressure, I want you to start checking your blood pressure daily and if your trend is closer to 140/90 then will need to make BP medication adjustments.  But also want you to schedule a future repeat CMP.  Make sure that you stay well-hydrated the day before and day of lab.  Want to get accurate measure of creatinine and GFR when you are hydrated.  Follow-up in 4 to 5 weeks or as needed.  Mackie Pai, PA-C

## 2018-04-01 NOTE — Patient Instructions (Signed)
Do one more visit of therapy with Micah then do home exercises. They'll put you on a 30 day hold so if pain worsens you can call back and complete the other visits. Call me if you have any problems otherwise follow up as needed. Icing, tylenol just as needed now.

## 2018-04-02 ENCOUNTER — Ambulatory Visit: Payer: Medicare Other | Attending: Family Medicine

## 2018-04-02 DIAGNOSIS — M25672 Stiffness of left ankle, not elsewhere classified: Secondary | ICD-10-CM

## 2018-04-02 DIAGNOSIS — R262 Difficulty in walking, not elsewhere classified: Secondary | ICD-10-CM

## 2018-04-02 DIAGNOSIS — M25572 Pain in left ankle and joints of left foot: Secondary | ICD-10-CM

## 2018-04-02 DIAGNOSIS — M6281 Muscle weakness (generalized): Secondary | ICD-10-CM | POA: Diagnosis present

## 2018-04-02 NOTE — Therapy (Signed)
[facility] [address]  [place], [place], [address] Phone: [phone]   Fax:  [phone]  Physical Therapy Treatment  Patient Details  Name: Olivia Morales MRN: [id number] Date of Birth: 1950/10/13 Referring Provider (PT): Karlton Lemon, MD   Encounter Date: 04/02/2018  PT End of Session - 04/02/18 0817    Visit Number  9    Number of Visits  13    Date for PT Re-Evaluation  04/07/18    Authorization Type  [employer] Medicare    PT Start Time  0810   Pt. arrived late    PT Stop Time  0840    PT Time Calculation (min)  30 min    Activity Tolerance  Patient tolerated treatment well;No increased pain    Behavior During Therapy  WFL for tasks assessed/performed       Past Medical History:  Diagnosis Date  . Diabetes mellitus   . Hypercholesteremia   . Hypertension   . Sleep apnea     Past Surgical History:  Procedure Laterality Date  . TUBAL LIGATION      There were no vitals filed for this visit.  Subjective Assessment - 04/02/18 0816    Subjective  Pt. noting she is comfortable transitioning to home program following today's visit.      Pertinent History  HTN, HLD, DM    Diagnostic tests  02/18/18 MSK u/s left ankle: Excellent callus formation over distal fibula fracture; 01/03/18 L ankle xray: Left fibular fracture as above with apparent partial interval healing    Patient Stated Goals  walking and physical exercise    Currently in Pain?  No/denies    Pain Score  0-No pain    Multiple Pain Sites  No         [facility] PT Assessment - 04/02/18 0001      Observation/Other Assessments   Focus on Therapeutic Outcomes (FOTO)   66 % (34% limitation)      AROM   Right/Left Ankle  Left    Left Ankle Dorsiflexion  8    Left Ankle Plantar Flexion  53    Left Ankle Inversion  32    Left Ankle Eversion  20      PROM   PROM Assessment Site  Ankle    Right/Left Ankle  Left    Left Ankle Dorsiflexion   10    Left Ankle Plantar Flexion  54    Left Ankle Inversion  38    Left Ankle Eversion  21      Strength   Right/Left Hip  Right;Left    Right Hip Flexion  4+/5    Right Hip ABduction  4/5    Right Hip ADduction  4+/5    Left Hip Flexion  4/5    Left Hip ABduction  4+/5    Left Hip ADduction  4+/5    Right/Left Knee  Right;Left    Right Knee Flexion  5/5    Right Knee Extension  5/5    Left Knee Flexion  4+/5    Left Knee Extension  5/5    Right/Left Ankle  Right;Left    Right Ankle Dorsiflexion  5/5    Right Ankle Plantar Flexion  4+/5    Right Ankle Inversion  4+/5    Right Ankle Eversion  5/5    Left Ankle Dorsiflexion  5/5    Left Ankle Plantar Flexion  4+/5    Left Ankle Inversion  5/5    Left Ankle Eversion  5/5                   [facility] Adult PT Treatment/Exercise - 04/02/18 0822      Self-Care   Self-Care  Other Self-Care Comments    Other Self-Care Comments   Reviewed comprehensive HEP to check for understanding and to condense as to remove activities which are no longer appropriate      Knee/Hip Exercises: Machines for Strengthening   Cybex Leg Press  B LE's 25# x 10 reps; B calf raise x 15 reps 25#       Ankle Exercises: Aerobic   Recumbent Bike  Lvl 2, 6 min    last minute on lvl 1 per pt. request      Ankle Exercises: Standing   Heel Raises  20 reps;3 seconds    Heel Raises Limitations  B LE      Ankle Exercises: Stretches   Gastroc Stretch  3 reps;30 seconds    Gastroc Stretch Limitations  prostretch             PT Education - 04/02/18 0831    Education Details  HEP update     Person(s) Educated  Patient    Methods  Explanation;Demonstration;Verbal cues;Handout    Comprehension  Verbalized understanding;Returned demonstration;Verbal cues required;Need further instruction       PT Short Term Goals - 03/12/18 0909      PT SHORT TERM GOAL #1   Title  Patient to be independent with initial HEP.    Time  3    Period  Weeks     Status  Achieved        PT Long Term Goals - 04/02/18 0818      PT LONG TERM GOAL #1   Title  Patient to be independent with advanced HEP.    Time  6    Period  Weeks    Status  Achieved      PT LONG TERM GOAL #2   Title  Patient to demonstrate [facility] and pain-free L ankle AROM/PROM.    Time  6    Period  Weeks    Status  Partially Met      PT LONG TERM GOAL #3   Title  Patient to demonstrate B LE strength >=4+/5.    Time  6    Period  Weeks    Status  Partially Met      PT LONG TERM GOAL #4   Title  Patient to demonstrate each LE SLS for 10 sec without LOB.    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     Time  6    Period  Weeks    Status  On-going   Has not yet attempted           Plan - 04/02/18 9292    Clinical Impression Statement  Pt. arrived late to session today thus treatment limited.  Pt. noting MD pleased with her progress at recent f/u and recommended that she go on 30-day hold from therapy after today's session with PT and pt. in agreement with this.  Pt. 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 has not yet attempted this.  Pt. now on 30-day hold from therapy after comprehensive HEP review.      PT Treatment/Interventions  ADLs/Self Care Home Management;Cryotherapy;Electrical Stimulation;Iontophoresis 69m/ml Dexamethasone;Moist Heat;Ultrasound;DME Instruction;Gait training;Stair training;Functional mobility training;Therapeutic activities;Therapeutic exercise;Manual techniques;Orthotic Fit/Training;Patient/family education;Neuromuscular re-education;Balance training;Scar mobilization;Passive range of motion;Dry needling;Energy conservation;Splinting;Taping;Vasopneumatic Device    PT Next Visit Plan  30-day hold    Consulted and Agree  with Plan of Care  Patient       Patient will benefit from skilled therapeutic intervention in order to improve the following deficits and impairments:  Hypomobility, Increased edema, Decreased activity tolerance, Decreased strength, Pain, Decreased balance, Difficulty walking, Decreased range of motion, Impaired flexibility  Visit Diagnosis: Pain in left ankle and joints of left foot  Stiffness of left ankle, not elsewhere classified  Difficulty in walking, not elsewhere classified  Muscle weakness (generalized)     Problem List Patient Active Problem List   Diagnosis Date Noted  . Left ankle injury, subsequent encounter 12/19/2017  . Class 2 severe obesity due to excess calories with serious comorbidity and body mass index (BMI) of 35.0 to 35.9 in adult ([facility] 03/05/2017  . 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 Nocturia more than twice per night 02/08/2016  . Noncompliance with CPAP treatment 02/08/2016  . CKD (chronic kidney disease), stage III ([facility] 01/11/2014  . Hyperlipidemia associated with type 2 diabetes mellitus ([facility] 09/14/2013  . Hypertension associated with diabetes ([facility] 10/31/2011  . Diabetes mellitus ([facility] 10/31/2011    MBess Harvest PTA 04/02/18 11:34 AM   [facility] [address] [place] [address]Phone: [phone]  Fax:  [phone] Name: PRaianna SlightMRN: [id number] of Birth: [birth year]

## 2018-04-10 ENCOUNTER — Other Ambulatory Visit (INDEPENDENT_AMBULATORY_CARE_PROVIDER_SITE_OTHER): Payer: Medicare Other

## 2018-04-10 ENCOUNTER — Telehealth: Payer: Self-pay | Admitting: Medical

## 2018-04-10 ENCOUNTER — Ambulatory Visit (INDEPENDENT_AMBULATORY_CARE_PROVIDER_SITE_OTHER): Payer: Medicare Other

## 2018-04-10 DIAGNOSIS — Z23 Encounter for immunization: Secondary | ICD-10-CM

## 2018-04-10 DIAGNOSIS — I10 Essential (primary) hypertension: Secondary | ICD-10-CM | POA: Diagnosis not present

## 2018-04-10 DIAGNOSIS — E1159 Type 2 diabetes mellitus with other circulatory complications: Secondary | ICD-10-CM

## 2018-04-10 DIAGNOSIS — R944 Abnormal results of kidney function studies: Secondary | ICD-10-CM

## 2018-04-10 LAB — COMPREHENSIVE METABOLIC PANEL
ALT: 10 U/L (ref 0–35)
AST: 16 U/L (ref 0–37)
Albumin: 4.6 g/dL (ref 3.5–5.2)
Alkaline Phosphatase: 91 U/L (ref 39–117)
BILIRUBIN TOTAL: 0.6 mg/dL (ref 0.2–1.2)
BUN: 28 mg/dL — ABNORMAL HIGH (ref 6–23)
CALCIUM: 10.5 mg/dL (ref 8.4–10.5)
CO2: 28 meq/L (ref 19–32)
CREATININE: 1.92 mg/dL — AB (ref 0.40–1.20)
Chloride: 102 mEq/L (ref 96–112)
GFR: 33.64 mL/min — AB (ref >60.00)
Glucose, Bld: 116 mg/dL — ABNORMAL HIGH (ref 70–99)
Potassium: 3.7 mEq/L (ref 3.5–5.1)
Sodium: 141 mEq/L (ref 135–145)
Total Protein: 8 g/dL (ref 6.0–8.3)

## 2018-04-10 NOTE — Telephone Encounter (Signed)
Future CMP placed. 

## 2018-04-29 ENCOUNTER — Encounter: Payer: Self-pay | Admitting: Medical

## 2018-04-29 ENCOUNTER — Ambulatory Visit (INDEPENDENT_AMBULATORY_CARE_PROVIDER_SITE_OTHER): Payer: Medicare Other | Admitting: Medical

## 2018-04-29 VITALS — BP 125/80 | Resp 16 | Ht 64.0 in | Wt 186.0 lb

## 2018-04-29 DIAGNOSIS — N183 Chronic kidney disease, stage 3 unspecified: Secondary | ICD-10-CM

## 2018-04-29 DIAGNOSIS — E1165 Type 2 diabetes mellitus with hyperglycemia: Secondary | ICD-10-CM

## 2018-04-29 DIAGNOSIS — M79672 Pain in left foot: Secondary | ICD-10-CM

## 2018-04-29 DIAGNOSIS — E1169 Type 2 diabetes mellitus with other specified complication: Secondary | ICD-10-CM | POA: Diagnosis not present

## 2018-04-29 DIAGNOSIS — E1159 Type 2 diabetes mellitus with other circulatory complications: Secondary | ICD-10-CM

## 2018-04-29 DIAGNOSIS — M79671 Pain in right foot: Secondary | ICD-10-CM

## 2018-04-29 DIAGNOSIS — I10 Essential (primary) hypertension: Secondary | ICD-10-CM

## 2018-04-29 DIAGNOSIS — E1140 Type 2 diabetes mellitus with diabetic neuropathy, unspecified: Secondary | ICD-10-CM

## 2018-04-29 DIAGNOSIS — E785 Hyperlipidemia, unspecified: Secondary | ICD-10-CM

## 2018-04-29 NOTE — Progress Notes (Signed)
Subjective:    Patient ID: Olivia Morales, female    DOB: 11/13/1950, 67 y.o.   MRN: [id number]  HPI   Pt in for follow up.  Pt state feeling great. No cardiac or neurologic signs or symptoms. Pt bp borderline on first check.  Pt blood sugars have been low 60 and high 200. Most low 100's. Pt 60's are about 3 since I last saw her. She does eat a late breakfast around 10-11 am  at times. The 60's are around 10 am.   Pt has high cholesterol. Pt is on lipitor. No side effects.  Pt is doing diabetic diet specialist in near future.  Pt does report some nerve type pain her feet. She thinks pain at least 10-15 years ago. Maybe more.   Pt does have nightly pain in her heels for years. First step in morning her heels. Left side hurts more.   Review of Systems  Constitutional: Negative for chills, fatigue and fever.  Respiratory: Negative for cough, chest tightness, shortness of breath and wheezing.   Cardiovascular: Negative for chest pain and palpitations.  Gastrointestinal: Negative for abdominal pain.  Endocrine: Negative for polydipsia, polyphagia and polyuria.  Musculoskeletal: Negative for back pain and joint swelling.  Skin: Negative for rash.  Neurological: Negative for dizziness, seizures, weakness, light-headedness and numbness.  Hematological: Negative for adenopathy. Does not bruise/bleed easily.  Psychiatric/Behavioral: Negative for behavioral problems, confusion and hallucinations.    Past Medical History:  Diagnosis Date  . Diabetes mellitus   . Hypercholesteremia   . Hypertension   . Sleep apnea      Social History   Socioeconomic History  . Marital status: Widowed    Spouse name: Not on file  . Number of children: Not on file  . Years of education: Not on file  . Highest education level: Not on file  Occupational History  . Not on file  Social Needs  . Financial resource strain: Not on file  . Food insecurity:    Worry: Not on file   Inability: Not on file  . Transportation needs:    Medical: Not on file    Non-medical: Not on file  Tobacco Use  . Smoking status: Never Smoker  . Smokeless tobacco: Never Used  Substance and Sexual Activity  . Alcohol use: Yes    Alcohol/week: 14.0 standard drinks    Types: 14 Cans of beer per week  . Drug use: No  . Sexual activity: Yes  Lifestyle  . Physical activity:    Days per week: Not on file    Minutes per session: Not on file  . Stress: Not on file  Relationships  . Social connections:    Talks on phone: Not on file    Gets together: Not on file    Attends religious service: Not on file    Active member of club or organization: Not on file    Attends meetings of clubs or organizations: Not on file    Relationship status: Not on file  . Intimate partner violence:    Fear of current or ex partner: Not on file    Emotionally abused: Not on file    Physically abused: Not on file    Forced sexual activity: Not on file  Other Topics Concern  . Not on file  Social History Narrative  . Not on file    Past Surgical History:  Procedure Laterality Date  . TUBAL LIGATION      Family History  Problem Relation Age of Onset  . Sickle cell anemia Father     Allergies  Allergen Reactions  . Ace Inhibitors     Cough    Current Outpatient Medications on File Prior to Visit  Medication Sig Dispense Refill  . amLODipine (NORVASC) 10 MG tablet Take 10 mg by mouth daily.     Marland Kitchen atorvastatin (LIPITOR) 40 MG tablet Take 40 mg by mouth daily.    . blood glucose meter kit and supplies Check blood sugar 1-2 times a day (FOR ICD-E11.9). 1 each 0  . diphenhydramine-acetaminophen (TYLENOL PM) 25-500 MG TABS Take 2 tablets by mouth every 4 (four) hours as needed (pain).    . Dulaglutide (TRULICITY) 5.46 EV/0.3JK SOPN Inject 0.75 mg into the skin once a week. 4 pen 3  . hydrochlorothiazide (MICROZIDE) 12.5 MG capsule Take 1 capsule by mouth daily.    . Lancets (ONETOUCH DELICA  PLUS [id number]) MISC USE TO TEST 1-2 TIMES DAILY  0  . losartan (COZAAR) 25 MG tablet Take 25 mg by mouth daily.      . metoprolol succinate (TOPROL-XL) 50 MG 24 hr tablet Take 1 tablet by mouth daily.    Marland Kitchen omeprazole (PRILOSEC) 20 MG capsule Take 20 mg by mouth daily.    Glory Rosebush VERIO test strip TEST BG 1 TO 2 TIMES D  0  . PRESCRIPTION MEDICATION Place 1 drop into both eyes daily. For dry eye and allergies.    Marland Kitchen traMADol (ULTRAM) 50 MG tablet Take by mouth every 6 (six) hours as needed.     No current facility-administered medications on file prior to visit.     BP 140/70   Resp 16   Ht '5\' 4"'  (1.626 m)   Wt 186 lb (84.4 kg)   BMI 31.93 kg/m       Objective:   Physical Exam  General Mental Status- Alert. General Appearance- Not in acute distress.   Skin General: Color- Normal Color. Moisture- Normal Moisture.  Neck Carotid Arteries- Normal color. Moisture- Normal Moisture. No carotid bruits. No JVD.  Chest and Lung Exam Auscultation: Breath Sounds:-Normal.  Cardiovascular Auscultation:Rythm- Regular. Murmurs & Other Heart Sounds:Auscultation of the heart reveals- No Murmurs.  Abdomen Inspection:-Inspeection Normal. Palpation/Percussion:Note:No mass. Palpation and Percussion of the abdomen reveal- Non Tender, Non Distended + BS, no rebound or guarding.   Neurologic Cranial Nerve exam:- CN III-XII intact(No nystagmus), symmetric smile. Strength:- 5/5 equal and symmetric strength both upper and lower extremities.  left heel. Mild pain on palpation. Rt heel- no tenderness.      Assessment & Plan:  Your sugar levels seem to be better overall since starting the Trulicity.  Would advise that you continue current dose.  I do want you to eat breakfast bit earlier as you had some sugar levels in the 60 range.  Would recommend eating breakfast sometime between 8 and 9.  We will repeat A1c in early December and hopefully your A1c will be 7 or less.  Clarified today that  you are no longer taking glipizide.  Continue to stay off of that as this can lead to hypoglycemia.  Your blood pressure level is good today and has been good when you check at home.  Continue current blood pressure medication regimen.  For high cholesterol continue current statin medication.  We will check a lipid panel in December will recheck A1c.  So make sure on December office visit you come in fasting.  However do schedule early morning appointment that day.  For chronic heel pain worse than the left side, I did make referral to podiatrist.  On referral did make note to evaluate feet for a potential diabetic shoes.  Follow-up on December 9 or as needed.  40 minute spent with pt.  Mackie Pai, PA-C

## 2018-04-29 NOTE — Patient Instructions (Signed)
Your sugar levels seem to be better overall since starting the Trulicity.  Would advise that you continue current dose.  I do want you to eat breakfast bit earlier as you had some sugar levels in the 60 range.  Would recommend eating breakfast sometime between 8 and 9.  We will repeat A1c in early December and hopefully your A1c will be 7 or less.  Clarified today that you are no longer taking glipizide.  Continue to stay off of that as this can lead to hypoglycemia.  Your blood pressure level is good today and has been good when you check at home.  Continue current blood pressure medication regimen.  For high cholesterol continue current statin medication.  We will check a lipid panel in December will recheck A1c.  So make sure on December office visit you come in fasting.  However do schedule early morning appointment that day.  For chronic heel pain worse than the left side, I did make referral to podiatrist.  On referral did make note to evaluate feet for a potential diabetic shoes.  Follow-up on December 9 or as needed.

## 2018-05-02 ENCOUNTER — Ambulatory Visit: Payer: Medicare Other | Attending: Family Medicine | Admitting: Physical Therapy

## 2018-05-02 ENCOUNTER — Encounter: Payer: Self-pay | Admitting: Physical Therapy

## 2018-05-02 DIAGNOSIS — R262 Difficulty in walking, not elsewhere classified: Secondary | ICD-10-CM | POA: Insufficient documentation

## 2018-05-02 DIAGNOSIS — M25572 Pain in left ankle and joints of left foot: Secondary | ICD-10-CM | POA: Insufficient documentation

## 2018-05-02 DIAGNOSIS — M25672 Stiffness of left ankle, not elsewhere classified: Secondary | ICD-10-CM | POA: Diagnosis present

## 2018-05-02 DIAGNOSIS — M6281 Muscle weakness (generalized): Secondary | ICD-10-CM | POA: Insufficient documentation

## 2018-05-02 NOTE — Therapy (Signed)
[facility] [address]  [address] [place], [place], [address] Phone: [phone]   Fax:  [phone]  Physical Therapy Progress Note  Patient Details  Name: Olivia Morales MRN: [id number] Date of Birth: 03/04/1951 Referring Provider (PT): Norton Blizzard, MD  Progress Note Reporting Period 02/24/18 to 05/02/18  See note below for Objective Data and Assessment of Progress/Goals.    Encounter Date: 05/02/2018  PT End of Session - 05/02/18 1234    Visit Number  10    Number of Visits  22    Date for PT Re-Evaluation  06/13/18    Authorization Type  [employer]HC Medicare    PT Start Time  0845    PT Stop Time  0928    PT Time Calculation (min)  43 min    Activity Tolerance  Patient tolerated treatment well    Behavior During Therapy  WFL for tasks assessed/performed       Past Medical History:  Diagnosis Date  . Diabetes mellitus   . Hypercholesteremia   . Hypertension   . Sleep apnea     Past Surgical History:  Procedure Laterality Date  . TUBAL LIGATION      There were no vitals filed for this visit.  Subjective Assessment - 05/02/18 0845    Subjective  Patient returns to PT after being placed on 30 day hold d/t severe night pain in L ankle. Told MD about it and he is sending her to a foot specialist. Has not been doing too many HEP exercises d/t being in [place]. Pain has been along L lateral malleolus, radiating to anterior ankle and into toes. Worst at night, however not so much with standing or walking.  Has also been having increased L ankle swelling and itching in bottom of foot. Has been using her fianc's pain cream on her ankle. Mentions that on her drive to [place] she had difficulty picking up toothpick with R hand and had R hand numbness- this lasted for 8 hours- did not have any slurring, facial droop, dizziness. Patient was worried she was having a stroke but did not contact her MD. Reports she had a bad day  yesterday- was forgetting dates and getting her appointments mixed up. This has been getting worse over the last month.    Pertinent History  HTN, HLD, DM    Diagnostic tests  02/18/18 MSK u/s left ankle: Excellent callus formation over distal fibula fracture; 01/03/18 L ankle xray: Left fibular fracture as above with apparent partial interval healing    Patient Stated Goals  walking and physical exercise    Currently in Pain?  No/denies         [facility] PT Assessment - 05/02/18 0001      Assessment   Medical Diagnosis  L ankle injury. subsequent encounter    Referring Provider (PT)  Norton Blizzard, MD    Onset Date/Surgical Date  12/09/17      Observation/Other Assessments   Observations  severely peeling and dry skin on L lateral ankle      Sensation   Light Touch  Appears Intact      Coordination   Gross Motor Movements are Fluid and Coordinated  Yes      Posture/Postural Control   Posture/Postural Control  Postural limitations    Postural Limitations  Rounded Shoulders      AROM   Right/Left Ankle  Left    Left Ankle Dorsiflexion  0    Left  Ankle Plantar Flexion  53    Left Ankle Inversion  10    Left Ankle Eversion  12      PROM   Left Ankle Dorsiflexion  0    Left Ankle Plantar Flexion  55    Left Ankle Inversion  20    Left Ankle Eversion  22      Strength   Right/Left Hip  Right;Left    Right Hip Flexion  4/5    Right Hip ABduction  4+/5    Right Hip ADduction  4/5    Left Hip Flexion  4-/5    Left Hip ABduction  4+/5    Left Hip ADduction  4/5    Right/Left Knee  Right;Left    Right Knee Flexion  4/5    Right Knee Extension  4/5    Left Knee Flexion  4-/5    Left Knee Extension  4/5    Right/Left Ankle  Right;Left    Right Ankle Dorsiflexion  5/5    Right Ankle Plantar Flexion  4+/5    Right Ankle Inversion  4+/5    Right Ankle Eversion  4+/5    Left Ankle Dorsiflexion  4+/5    Left Ankle Plantar Flexion  4+/5    Left Ankle Inversion  4/5    Left  Ankle Eversion  4/5      Palpation   Palpation comment  TTP over lateral and anterior L ankle      Ambulation/Gait   Assistive device  None    Gait Pattern  Step-through pattern;Decreased dorsiflexion - left;Decreased step length - right;Decreased stance time - left;Decreased weight shift to left    Ambulation Surface  Level;Indoor    Gait velocity  WFL      Standardized Balance Assessment   Standardized Balance Assessment  --   SLS 12 sec on R LE, 7 sec on L LE                  [facility] Adult PT Treatment/Exercise - 05/02/18 0001      Ankle Exercises: Aerobic   Nustep  L 3 x 6 min       Ankle Exercises: Stretches   Gastroc Stretch  2 reps;30 seconds;Limitations    Gastroc Stretch Limitations  at wall      Ankle Exercises: Standing   Heel Raises  20 reps;3 seconds    Heel Raises Limitations  B LEs at counter top    Other Standing Ankle Exercises  L LE SLS at counter top x2 min    Other Standing Ankle Exercises  each LE hamstring curl at counter top x10 each LE      Ankle Exercises: Seated   Other Seated Ankle Exercises  Lt ankle inversion & eversion with red TB x10 each on L LE             PT Education - 05/02/18 1234    Education Details  prognosis, POC, HEP    Person(s) Educated  Patient    Methods  Explanation;Demonstration;Tactile cues;Verbal cues    Comprehension  Verbalized understanding;Returned demonstration       PT Short Term Goals - 05/02/18 0850      PT SHORT TERM GOAL #1   Title  Patient to be independent with initial HEP.    Time  3    Period  Weeks    Status  New    Target Date  05/23/18        PT  Long Term Goals - 05/02/18 0850      PT LONG TERM GOAL #1   Title  Patient to be independent with advanced HEP.    Time  6    Period  Weeks    Status  On-going   has not been doing HEP lately   Target Date  06/13/18      PT LONG TERM GOAL #2   Title  Patient to demonstrate [facility] and pain-free L ankle AROM/PROM.    Time  6     Period  Weeks    Status  On-going   L ankle ROM decreased from last session- see objective measures   Target Date  06/13/18      PT LONG TERM GOAL #3   Title  Patient to demonstrate B LE strength >=4+/5.    Time  6    Period  Weeks    Status  On-going   decreased in B LE strength with MMT today   Target Date  06/13/18      PT LONG TERM GOAL #4   Title  Patient to demonstrate each LE SLS for 10 sec without LOB.    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     Time  6    Period  Weeks    Status  On-going   usthered recently with heels on that caused excruciating pain   Target Date  06/13/18            Plan - 05/02/18 1240    Clinical Impression Statement  Patient returns to PT after being placed on 30 day hold d/t severe night pain in L ankle. MD referred her to a foot specialist. Pain has been along L lateral malleolus, radiating to anterior ankle and into toes- worst at night. Has also been having increased L ankle swelling and itching in bottom of foot; mentions that she has been using her fianc's pain cream on her ankle. Also reports episode of difficulty with gripping and numbness in R hand while driving to [place] which lasted 8 hours. Denies slurring, facial droop, dizziness and reports these symptoms have since fully resolved. Also reports difficulty with memory over the last month. Advised patient to contact her MD about these symptoms and to seek immediate medical attention if these symptoms return or worsen. Patient reported understanding. Patient today with decreased L ankle ROM since last seen, marked weakness in B LEs, tenderness in L lateral and anterior ankle, and extremely dry skin on lateral ankle. Administered new HEP and worked on balance and strengthening exercises today. Patient  reported understanding of all edu given today. Would benefit from skilled PT services 2x/week for 6 weeks to address goals and return to PLOF.     PT Frequency  2x / week    PT Duration  6 weeks    PT Treatment/Interventions  ADLs/Self Care Home Management;Cryotherapy;Electrical Stimulation;Iontophoresis 4mg /ml Dexamethasone;Moist Heat;Ultrasound;DME Instruction;Gait training;Stair training;Functional mobility training;Therapeutic activities;Therapeutic exercise;Manual techniques;Orthotic Fit/Training;Patient/family education;Neuromuscular re-education;Balance training;Scar mobilization;Passive range of motion;Dry needling;Energy conservation;Splinting;Taping;Vasopneumatic Device    PT Next Visit Plan  FOTO, reassess HEP    Consulted and Agree with Plan of Care  Patient  Patient will benefit from skilled therapeutic intervention in order to improve the following deficits and impairments:  Hypomobility, Increased edema, Decreased activity tolerance, Decreased strength, Pain, Decreased balance, Difficulty walking, Decreased range of motion, Impaired flexibility  Visit Diagnosis: Pain in left ankle and joints of left foot  Stiffness of left ankle, not elsewhere classified  Difficulty in walking, not elsewhere classified  Muscle weakness (generalized)     Problem List Patient Active Problem List   Diagnosis Date Noted  . Left ankle injury, subsequent encounter 12/19/2017  . Class 2 severe obesity due to excess calories with serious comorbidity and body mass index (BMI) of 35.0 to 35.9 in adult (HCC) 03/05/2017  . 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 Nocturia more than twice per night 02/08/2016  . Noncompliance with CPAP treatment 02/08/2016  . CKD (chronic kidney disease), stage III (HCC) 01/11/2014  . Hyperlipidemia associated with type 2 diabetes mellitus (HCC) 09/14/2013  . Hypertension associated with diabetes (HCC) 10/31/2011  . Diabetes mellitus (HCC) 10/31/2011     Anette Guarneri, PT, DPT 05/02/18 12:51 PM   [facility] [address]  [address] [place], [place], [address] Phone: [phone]   Fax:  [phone]  Name: Olivia Morales MRN: [id number] Date of Birth: 03/29/1951

## 2018-05-05 ENCOUNTER — Ambulatory Visit: Payer: Medicare Other | Admitting: Physical Therapy

## 2018-05-06 ENCOUNTER — Encounter: Payer: Medicare Other | Attending: Medical | Admitting: Registered"

## 2018-05-06 ENCOUNTER — Encounter: Payer: Self-pay | Admitting: Registered"

## 2018-05-06 DIAGNOSIS — Z713 Dietary counseling and surveillance: Secondary | ICD-10-CM | POA: Insufficient documentation

## 2018-05-06 DIAGNOSIS — E11649 Type 2 diabetes mellitus with hypoglycemia without coma: Secondary | ICD-10-CM

## 2018-05-06 DIAGNOSIS — E1165 Type 2 diabetes mellitus with hyperglycemia: Secondary | ICD-10-CM | POA: Diagnosis not present

## 2018-05-06 NOTE — Progress Notes (Signed)
Diabetes Self-Management Education  Visit Type: First/Initial  Appt. Start Time: 0840 Appt. End Time: 1000  05/06/2018  Olivia Morales, identified by name and date of birth, is a 67 y.o. female with a diagnosis of Diabetes: Type 2.   ASSESSMENT Patient states the last A1c she remember was 11% and was happy to know it has gone down to 8.3%. Pt states she remembers the class 10 yrs ago and really enjoyed being in the class setting and the dietitian teaching the class.  Patient has asymptomatic hypoglycemia, several BG readings in the 60s and patient was not aware that was a problem. Pt did not remember being told to stop taking glipizide when she started the Trulicity. Patient found paper work from her doctor with the instruction to discontinue glipizide and states she will stop taking now. This may correct her hypoglycemia, but RD also instructed patient to check blood sugar regularly and before starting exercising.  Patient is very motivated to make changes, wants to be around for her grandchildren. Patient is still drinking regular soda a few times per week, but appears willing to change this habit. Other than soda, patient has reduced her carb intake per dietary recall.  Pt states she has had a couple of falls lately since moving into her new home. Pt states she thinks she got dizzy putting up curtain rods and fell, then fell again in June coming out of her storage shed, broke fractured ankle. Pt reports taking off the boot 1 month but is afraid to put weight on it. Pt states she lives with her fiancee and also asks her children and grandchildren to check on her.   Diabetes Self-Management Education - 05/06/18 0853      Visit Information   Visit Type  First/Initial      Initial Visit   Diabetes Type  Type 2    Are you currently following a meal plan?  No    Are you taking your medications as prescribed?  Yes    Date Diagnosed  10 yrs ago      Health Coping   How would you  rate your overall health?  Good      Psychosocial Assessment   Patient Belief/Attitude about Diabetes  Afraid    How often do you need to have someone help you when you read instructions, pamphlets, or other written materials from your doctor or pharmacy?  3 - Sometimes    What is the last grade level you completed in school?  12      Complications   Last HgB A1C per patient/outside source  8.3 %    How often do you check your blood sugar?  1-2 times/day    Fasting Blood glucose range (mg/dL)  <10;27-253;664-403    Number of hypoglycemic episodes per month  6    Can you tell when your blood sugar is low?  No    Have you had a dilated eye exam in the past 12 months?  No    Have you had a dental exam in the past 12 months?  Yes    Are you checking your feet?  Yes    How many days per week are you checking your feet?  7      Dietary Intake   Breakfast  Jersy mike [place] sub    Lunch  chicken salad or tuna sandwich,     Dinner  multi-grain bread, tomato, bacon   6 pm   Snack (evening)  popcorn    Beverage(s)  water, every other day reg pepsi, fanta peach soda      Exercise   Exercise Type  Light (walking / raking leaves)    How many days per week to you exercise?  2    How many minutes per day do you exercise?  15    Total minutes per week of exercise  30      Patient Education   Previous Diabetes Education  Yes (please comment)   at diagnosis 10 yrs ago   Nutrition management   Role of diet in the treatment of diabetes and the relationship between the three main macronutrients and blood glucose level    Physical activity and exercise   Role of exercise on diabetes management, blood pressure control and cardiac health.    Medications  Reviewed patients medication for diabetes, action, purpose, timing of dose and side effects.    Monitoring  Identified appropriate SMBG and/or A1C goals.    Acute complications  Taught treatment of hypoglycemia - the 15 rule.      Individualized  Goals (developed by patient)   Nutrition  General guidelines for healthy choices and portions discussed    Physical Activity  Exercise 3-5 times per week    Medications  take my medication as prescribed    Monitoring   test my blood glucose as discussed      Outcomes   Expected Outcomes  Demonstrated interest in learning. Expect positive outcomes    Future DMSE  4-6 wks    Program Status  Not Completed      Individualized Plan for Diabetes Self-Management Training:   Learning Objective:  Patient will have a greater understanding of diabetes self-management. Patient education plan is to attend individual and/or group sessions per assessed needs and concerns.  Patient Instructions  Rethink your drink, consider options on handout Consider eating balanced meals and snacks, remember to have protein when eating carbohydrates.  Check your blood sugar 3x day for a week a two  Fasting  2 hours after a meal  Before bed - If it is below 100, consider having a snack before bed   If have symptoms of low blood sugar. Treat with rule of 15 if below 70.  Continue with your plan to go to the [facility] and get in the pool for exercise. Check your blood sugar before you exercise, if below 100 eat something before exercise. Checking after workout for a few times is a good idea too so you can get to know your body.   Expected Outcomes:  Demonstrated interest in learning. Expect positive outcomes  Education material provided: A1C conversion sheet, My Plate, Support group flyer and Carbohydrate counting sheet  If problems or questions, patient to contact team via:  Phone and Email  Future DSME appointment: 4-6 wks

## 2018-05-06 NOTE — Patient Instructions (Addendum)
Rethink your drink, consider options on handout Consider eating balanced meals and snacks, remember to have protein when eating carbohydrates.  Check your blood sugar 3x day for a week a two  Fasting  2 hours after a meal  Before bed - If it is below 100, consider having a snack before bed   If have symptoms of low blood sugar. Treat with rule of 15 if below 70.  Continue with your plan to go to the [facility] and get in the pool for exercise. Check your blood sugar before you exercise, if below 100 eat something before exercise. Checking after workout for a few times is a good idea too so you can get to know your body.

## 2018-05-20 ENCOUNTER — Other Ambulatory Visit: Payer: Self-pay | Admitting: Medical

## 2018-05-23 ENCOUNTER — Encounter: Payer: Medicare Other | Admitting: Physical Therapy

## 2018-05-26 ENCOUNTER — Ambulatory Visit: Payer: Medicare Other | Admitting: Physical Therapy

## 2018-05-26 ENCOUNTER — Encounter: Payer: Self-pay | Admitting: Physical Therapy

## 2018-05-26 VITALS — BP 134/82 | HR 66

## 2018-05-26 DIAGNOSIS — R262 Difficulty in walking, not elsewhere classified: Secondary | ICD-10-CM

## 2018-05-26 DIAGNOSIS — M6281 Muscle weakness (generalized): Secondary | ICD-10-CM

## 2018-05-26 DIAGNOSIS — M25672 Stiffness of left ankle, not elsewhere classified: Secondary | ICD-10-CM

## 2018-05-26 DIAGNOSIS — M25572 Pain in left ankle and joints of left foot: Secondary | ICD-10-CM

## 2018-05-26 NOTE — Therapy (Addendum)
[facility] [address]  [place], [place], [address] Phone: [phone]   Fax:  [phone]  Physical Therapy Treatment  Patient Details  Name: Shakari Qazi MRN: [id number] Date of Birth: 03-03-1951 Referring Provider (PT): Karlton Lemon, MD   Progress Note Reporting Period 05/02/18 to 05/26/18  See note below for Objective Data and Assessment of Progress/Goals.    Encounter Date: 05/26/2018  PT End of Session - 05/26/18 0951    Visit Number  11    Number of Visits  22    Date for PT Re-Evaluation  06/13/18    Authorization Type  [employer]HC Medicare    PT Start Time  0813   patient late   PT Stop Time  0842    PT Time Calculation (min)  29 min    Activity Tolerance  Patient tolerated treatment well   limited by lightheadedness   Behavior During Therapy  [facility] for tasks assessed/performed       Past Medical History:  Diagnosis Date  . Diabetes mellitus   . Hypercholesteremia   . Hypertension   . Sleep apnea     Past Surgical History:  Procedure Laterality Date  . TUBAL LIGATION      Vitals:   05/26/18 0831  BP: 134/82  Pulse: 66  SpO2: 95%    Subjective Assessment - 05/26/18 0814    Subjective  Patient arrived late. Had to miss appointment with foot specialist because she had to go see her mother in [place]. Report she has not been doing her exercises because "I have not had the strength." Reports excessive swelling and pain in her ankle. Reports blood sugar levels have been low lately, but was 133 today. Feeling better today.     Pertinent History  HTN, HLD, DM    Diagnostic tests  02/18/18 MSK u/s left ankle: Excellent callus formation over distal fibula fracture; 01/03/18 L ankle xray: Left fibular fracture as above with apparent partial interval healing    Patient Stated Goals  walking and physical exercise    Currently in Pain?  No/denies                       [facility] Adult  PT Treatment/Exercise - 05/26/18 0001      Ankle Exercises: Aerobic   Nustep  L 2 x 5 min       Ankle Exercises: Stretches   Gastroc Stretch  2 reps;30 seconds;Limitations    Gastroc Stretch Limitations  at wall; each LE      Ankle Exercises: Standing   SLS  L SLS at counter top practice x2 min   cues for L lateral hip activation   Heel Raises  20 reps;3 seconds    Heel Raises Limitations  B LEs at counter top    Other Standing Ankle Exercises  L sided weight shift at counter top 10x3"    for introduction to SLS            PT Education - 05/26/18 0950    Education Details  edu on importance of compliance with HEP for continued benefit, advised patient to take meds as instructed by MD    Person(s) Educated  Patient    Methods  Explanation    Comprehension  Verbalized understanding       PT Short Term Goals - 05/26/18 0957      PT SHORT TERM GOAL #1   Title  Patient to be  independent with initial HEP.    Time  3    Period  Weeks    Status  On-going        PT Long Term Goals - 05/26/18 0957      PT LONG TERM GOAL #1   Title  Patient to be independent with advanced HEP.    Time  6    Period  Weeks    Status  On-going   has not been doing HEP lately     PT LONG TERM GOAL #2   Title  Patient to demonstrate [facility] and pain-free L ankle AROM/PROM.    Time  6    Period  Weeks    Status  On-going   L ankle ROM decreased from last session- see objective measures     PT LONG TERM GOAL #3   Title  Patient to demonstrate B LE strength >=4+/5.    Time  6    Period  Weeks    Status  On-going   decreased in B LE strength with MMT today     PT LONG TERM GOAL #4   Title  Patient to demonstrate each LE SLS for 10 sec without LOB.    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     Time  6    Period  Weeks     Status  On-going   usthered recently with heels on that caused excruciating pain           Plan - 05/26/18 0952    Clinical Impression Statement  Patient arrived late to session with report that she has missed PT appointments and foot specialist appointment d/t "feeling down" from low blood sugar levels and having to go to [place] to check on her mother. Reports persisting L foot pain at MTP joints to the point of tears, however no pain today. Patient noncompliant with HEP at this time- emphasized importance of maintaining consistency with HEP. Reviewed HEP exercises with patient and provided cues to correct form. Patient with c/o lightheadedness with SLS at counter top- offered sitting rest break until symptoms dissipated. Vitals were taken which were [facility]. Patient admitted to not taking her meds this AM. Advised patient to take meds as instructed by MD and contact PCP if symptoms continue. Patient reported understanding.    PT Treatment/Interventions  ADLs/Self Care Home Management;Cryotherapy;Electrical Stimulation;Iontophoresis 5m/ml Dexamethasone;Moist Heat;Ultrasound;DME Instruction;Gait training;Stair training;Functional mobility training;Therapeutic activities;Therapeutic exercise;Manual techniques;Orthotic Fit/Training;Patient/family education;Neuromuscular re-education;Balance training;Scar mobilization;Passive range of motion;Dry needling;Energy conservation;Splinting;Taping;Vasopneumatic Device    PT Next Visit Plan  FOTO, reassess HEP    Consulted and Agree with Plan of Care  Patient       Patient will benefit from skilled therapeutic intervention in order to improve the following deficits and impairments:  Hypomobility, Increased edema, Decreased activity tolerance, Decreased strength, Pain, Decreased balance, Difficulty walking, Decreased range of motion, Impaired flexibility  Visit Diagnosis: Pain in left ankle and joints of left foot  Stiffness of left ankle, not elsewhere  classified  Difficulty in walking, not elsewhere classified  Muscle weakness (generalized)     Problem List Patient Active Problem List   Diagnosis Date Noted  . Left ankle injury, subsequent encounter 12/19/2017  . Class  2 severe obesity due to excess calories with serious comorbidity and body mass index (BMI) of 35.0 to 35.9 in adult ([place]) 03/05/2017  . 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 Nocturia more than twice per night 02/08/2016  . Noncompliance with CPAP treatment 02/08/2016  . CKD (chronic kidney disease), stage III ([place]) 01/11/2014  . Hyperlipidemia associated with type 2 diabetes mellitus ([place]) 09/14/2013  . Hypertension associated with diabetes ([place]) 10/31/2011  . Diabetes mellitus ([place]) 10/31/2011     Janene Harvey, PT, DPT 05/26/18 10:02 AM   [facility] [address]  [place], [place], [address] Phone: [phone]   Fax:  [phone]  Name: Fatimata Talsma MRN: [id number] Date of Birth: [birth year]  PHYSICAL THERAPY DISCHARGE SUMMARY  Visits from Start of Care: 11  Current functional level related to goals / functional outcomes: See above clinical impression; patient d/c'd per no-show policy   Remaining deficits: See above   Education / Equipment: HEP  Plan: Patient agrees to discharge.  Patient goals were not met. Patient is being discharged due to not returning since the last visit.  ?????      Janene Harvey, PT, DPT 07/14/18 1:40 PM

## 2018-05-28 ENCOUNTER — Other Ambulatory Visit: Payer: Self-pay | Admitting: Medical

## 2018-05-28 MED ORDER — ONETOUCH VERIO VI STRP: ORAL_STRIP | 12 refills | Status: AC

## 2018-05-28 NOTE — Telephone Encounter (Signed)
Copied from CRM [phone]. Topic: Quick Communication - Rx Refill/Question >> May 28, 2018  8:24 AM Olivia Morales, Olivia Morales L wrote: Medication: Lum BabeNETOUCH VERIO test strip   pt has only one left   Has the patient contacted their pharmacy? Yes.  Pharmacy told her that she need test strips  (Agent: If no, request that the patient contact the pharmacy for the refill.) (Agent: If yes, when and what did the pharmacy advise?) the pharmacy called yesterday to fill test strips  Preferred Pharmacy (with phone number or street name):     [facility] [place] - [id number] [address] [phone] (Phone) [phone])    Agent: Please be advised that RX refills may take up to 3 business days. We ask that you follow-up with your pharmacy.

## 2018-05-28 NOTE — Telephone Encounter (Signed)
Requested medication (s) are due for refill today -yes  Requested medication (s) are on the active medication list yes  Future visit scheduled yes  Last refill: 03/28/18  Notes to clinic: Patient is requesting a refill of historical medication. Will need provider to do initial approval.  Requested Prescriptions  Pending Prescriptions Disp Refills   ONETOUCH VERIO test strip 100 each 0    Sig: TEST BG 1 TO 2 TIMES D     Endocrinology: Diabetes - Testing Supplies Passed - 05/28/2018  8:41 AM      Passed - Valid encounter within last 12 months    Recent Outpatient Visits          4 weeks ago Type 2 diabetes mellitus with hyperglycemia, without long-term current use of insulin (HCC)   [facility] [place], [place], [place]   1 month ago Type 2 diabetes mellitus with hyperglycemia, without long-term current use of insulin (HCC)   [facility] [place], [place], [place]   2 months ago Hypertension associated with diabetes (HCC)   [facility] [place], [place], [place]-C      Future Appointments            In 1 week Saguier, Ramon DredgeEdward, PA-C [facility] [place]            Requested Prescriptions  Pending Prescriptions Disp Refills   ONETOUCH VERIO test strip 100 each 0    Sig: TEST BG 1 TO 2 TIMES D     Endocrinology: Diabetes - Testing Supplies Passed - 05/28/2018  8:41 AM      Passed - Valid encounter within last 12 months    Recent Outpatient Visits          4 weeks ago Type 2 diabetes mellitus with hyperglycemia, without long-term current use of insulin (HCC)   [facility] [place], [place], PA-C   1 month ago Type 2 diabetes mellitus with hyperglycemia, without long-term current use of insulin (HCC)   [facility] [place], [place], PA-C   2 months  ago Hypertension associated with diabetes (HCC)   [facility] [place], [place], [place] Airlines[place]-C      Future Appointments            In 1 week Saguier, Ramon DredgeEdward, PA-C [facility] [place]

## 2018-06-06 ENCOUNTER — Encounter: Payer: Medicare Other | Admitting: Physical Therapy

## 2018-06-09 ENCOUNTER — Ambulatory Visit: Payer: Medicare Other | Admitting: Medical

## 2018-06-09 ENCOUNTER — Encounter: Payer: Self-pay | Admitting: Medical

## 2018-06-09 VITALS — BP 137/70 | HR 68 | Temp 98.1°F | Resp 16 | Ht 64.0 in | Wt 186.4 lb

## 2018-06-09 DIAGNOSIS — N183 Chronic kidney disease, stage 3 unspecified: Secondary | ICD-10-CM

## 2018-06-09 DIAGNOSIS — Z113 Encounter for screening for infections with a predominantly sexual mode of transmission: Secondary | ICD-10-CM

## 2018-06-09 DIAGNOSIS — E1159 Type 2 diabetes mellitus with other circulatory complications: Secondary | ICD-10-CM

## 2018-06-09 DIAGNOSIS — R5383 Other fatigue: Secondary | ICD-10-CM

## 2018-06-09 DIAGNOSIS — E1165 Type 2 diabetes mellitus with hyperglycemia: Secondary | ICD-10-CM

## 2018-06-09 DIAGNOSIS — E1169 Type 2 diabetes mellitus with other specified complication: Secondary | ICD-10-CM

## 2018-06-09 DIAGNOSIS — E785 Hyperlipidemia, unspecified: Secondary | ICD-10-CM

## 2018-06-09 DIAGNOSIS — I10 Essential (primary) hypertension: Secondary | ICD-10-CM

## 2018-06-09 NOTE — Patient Instructions (Signed)
For your diabetes, continue Trulicity and low sugar diet.  Placing future A1c and metabolic panel.  You have done well so far with breaking your sugars down.  For chronic kidney disease, we will check kidney function with metabolic panel.  For hypertension continue current medication regimen/losartan.  For dyslipidemia, continue statin.  Will get fasting lipid panel when labs drawn.  You report some moderate fatigue recently.  When future labs are done will include a TSH, B12 and B1.  Follow-up likely in 3 months or as needed.

## 2018-06-09 NOTE — Progress Notes (Signed)
Subjective:    Patient ID: Olivia Morales, female    DOB: [birth year], 67 y.o.   MRN: [id number]  HPI  Pt in for follow up.  Your sugar levels seem to be better overall since starting the Trulicity.   A1c has dropped, attended diabetic classes and compliant on diet.  Your blood pressure level is good today and has been good when you check at home.  Compliant on medication.  Pt is on statin. Last lipid check looked looked. Good.    Review of Systems  Constitutional: Positive for fatigue. Negative for chills and fever.       Some moderate fatigue recently. She is working part time only sitting.  HENT: Negative for congestion, ear pain and facial swelling.   Respiratory: Negative for cough, chest tightness, shortness of breath and wheezing.   Cardiovascular: Negative for chest pain and palpitations.  Gastrointestinal: Negative for abdominal pain.  Musculoskeletal: Negative for back pain.  Skin: Negative for rash.  Neurological: Negative for dizziness, speech difficulty and headaches.  Hematological: Negative for adenopathy. Does not bruise/bleed easily.  Psychiatric/Behavioral: Negative for behavioral problems and confusion.    Past Medical History:  Diagnosis Date  . Diabetes mellitus   . Hypercholesteremia   . Hypertension   . Sleep apnea      Social History   Socioeconomic History  . Marital status: Widowed    Spouse name: Not on file  . Number of children: Not on file  . Years of education: Not on file  . Highest education level: Not on file  Occupational History  . Not on file  Social Needs  . Financial resource strain: Not on file  . Food insecurity:    Worry: Not on file    Inability: Not on file  . Transportation needs:    Medical: Not on file    Non-medical: Not on file  Tobacco Use  . Smoking status: Never Smoker  . Smokeless tobacco: Never Used  Substance and Sexual Activity  . Alcohol use: Yes    Alcohol/week: 14.0 standard drinks   Types: 14 Cans of beer per week  . Drug use: No  . Sexual activity: Yes  Lifestyle  . Physical activity:    Days per week: Not on file    Minutes per session: Not on file  . Stress: Not on file  Relationships  . Social connections:    Talks on phone: Not on file    Gets together: Not on file    Attends religious service: Not on file    Active member of club or organization: Not on file    Attends meetings of clubs or organizations: Not on file    Relationship status: Not on file  . Intimate partner violence:    Fear of current or ex partner: Not on file    Emotionally abused: Not on file    Physically abused: Not on file    Forced sexual activity: Not on file  Other Topics Concern  . Not on file  Social History Narrative  . Not on file    Past Surgical History:  Procedure Laterality Date  . TUBAL LIGATION      Family History  Problem Relation Age of Onset  . Sickle cell anemia Father     Allergies  Allergen Reactions  . Ace Inhibitors     Cough    Current Outpatient Medications on File Prior to Visit  Medication Sig Dispense Refill  . amLODipine (NORVASC) 10 MG  tablet Take 10 mg by mouth daily.     Marland Kitchen atorvastatin (LIPITOR) 40 MG tablet Take 40 mg by mouth daily.    . blood glucose meter kit and supplies Check blood sugar 1-2 times a day (FOR ICD-E11.9). 1 each 0  . diphenhydramine-acetaminophen (TYLENOL PM) 25-500 MG TABS Take 2 tablets by mouth every 4 (four) hours as needed (pain).    . Dulaglutide (TRULICITY) 9.65 YO/3.7CH SOPN Inject 0.75 mg into the skin once a week. 4 pen 3  . hydrochlorothiazide (MICROZIDE) 12.5 MG capsule Take 1 capsule by mouth daily.    . Lancets (ONETOUCH DELICA PLUS [id number]) MISC USE TO TEST 1-2 TIMES DAILY 100 each 0  . losartan (COZAAR) 25 MG tablet Take 25 mg by mouth daily.      . metoprolol succinate (TOPROL-XL) 50 MG 24 hr tablet Take 1 tablet by mouth daily.    Marland Kitchen omeprazole (PRILOSEC) 20 MG capsule Take 20 mg by mouth daily.     Glory Rosebush VERIO test strip Check blood sugar 1-2 times daily 100 each 12  . PRESCRIPTION MEDICATION Place 1 drop into both eyes daily. For dry eye and allergies.     No current facility-administered medications on file prior to visit.     BP 137/70   Pulse 68   Temp 98.1 F (36.7 C) (Oral)   Resp 16   Ht '5\' 4"'  (1.626 m)   Wt 186 lb 6.4 oz (84.6 kg)   SpO2 100%   BMI 32.00 kg/m       Objective:   Physical Exam  General Mental Status- Alert. General Appearance- Not in acute distress.   Skin General: Color- Normal Color. Moisture- Normal Moisture.  Neck Carotid Arteries- Normal color. Moisture- Normal Moisture. No carotid bruits. No JVD.  Chest and Lung Exam Auscultation: Breath Sounds:-Normal.  Cardiovascular Auscultation:Rythm- Regular. Murmurs & Other Heart Sounds:Auscultation of the heart reveals- No Murmurs.  Abdomen Inspection:-Inspeection Normal. Palpation/Percussion:Note:No mass. Palpation and Percussion of the abdomen reveal- Non Tender, Non Distended + BS, no rebound or guarding.    Neurologic Cranial Nerve exam:- CN III-XII intact(No nystagmus), symmetric smile. Strength:- 5/5 equal and symmetric strength both upper and lower extremities.      Assessment & Plan:  For your diabetes, continue Trulicity and low sugar diet.  Placing future O8N and metabolic panel.  You have done well so far with breaking your sugars down.  For chronic kidney disease, we will check kidney function with metabolic panel.  For hypertension continue current medication regimen/losartan.  For dyslipidemia, continue statin.  Will get fasting lipid panel when labs drawn.  You report some moderate fatigue recently.  When future labs are done will include a TSH, B12 and B1.  Follow-up likely in 3 months or as needed.  25 minutes spent with pt. 50% of time spent counseling on maintaining current tx regmen and also explaining work up for her fatigue. Mackie Pai, PA-C

## 2018-06-13 ENCOUNTER — Encounter: Payer: Medicare Other | Admitting: Physical Therapy

## 2018-06-17 ENCOUNTER — Ambulatory Visit: Payer: Medicare Other | Admitting: Registered"

## 2018-06-18 ENCOUNTER — Ambulatory Visit: Payer: Medicare Other | Admitting: Registered"

## 2018-07-22 ENCOUNTER — Other Ambulatory Visit: Payer: Self-pay | Admitting: Medical

## 2018-07-22 ENCOUNTER — Telehealth: Payer: Self-pay | Admitting: Medical

## 2018-07-22 ENCOUNTER — Other Ambulatory Visit: Payer: Self-pay

## 2018-07-22 MED ORDER — DULAGLUTIDE 0.75 MG/0.5ML ~~LOC~~ SOAJ: 0.7500 mg | SUBCUTANEOUS | 3 refills | Status: DC

## 2018-07-22 NOTE — Telephone Encounter (Signed)
Copied from CRM [id number]. Topic: Quick Communication - Rx Refill/Question >> Jul 22, 2018 11:37 AM Jilda Roche wrote: Medication: Dulaglutide (TRULICITY) 0.75 MG/0.5ML SOPN  Has the patient contacted their pharmacy? No. (Agent: If no, request that the patient contact the pharmacy for the refill.) (Agent: If yes, when and what did the pharmacy advise?)  Preferred Pharmacy (with phone number or street name): [id number] - [place], [place] - [address] AT [facility] [address] [phone] (Phone) [phone] (Fax)    Agent: Please be advised that RX refills may take up to 3 business days. We ask that you follow-up with your pharmacy.

## 2018-07-22 NOTE — Telephone Encounter (Signed)
Medication filed on 07/22/18

## 2018-07-26 ENCOUNTER — Other Ambulatory Visit: Payer: Self-pay | Admitting: Medical

## 2018-08-19 ENCOUNTER — Other Ambulatory Visit: Payer: Self-pay

## 2018-08-19 MED ORDER — HYDROCHLOROTHIAZIDE 12.5 MG PO CAPS: 12.5000 mg | ORAL_CAPSULE | Freq: Every day | ORAL | 0 refills | Status: DC

## 2018-08-19 MED ORDER — AMLODIPINE BESYLATE 10 MG PO TABS: 10.0000 mg | ORAL_TABLET | Freq: Every day | ORAL | 0 refills | Status: DC

## 2018-08-19 MED ORDER — METOPROLOL SUCCINATE ER 50 MG PO TB24: 50.0000 mg | ORAL_TABLET | Freq: Every day | ORAL | 0 refills | Status: DC

## 2018-08-21 ENCOUNTER — Other Ambulatory Visit: Payer: Self-pay

## 2018-08-21 MED ORDER — ATORVASTATIN CALCIUM 40 MG PO TABS: 40.0000 mg | ORAL_TABLET | Freq: Every day | ORAL | 0 refills | Status: DC

## 2018-08-21 MED ORDER — AMLODIPINE BESYLATE 10 MG PO TABS: 10.0000 mg | ORAL_TABLET | Freq: Every day | ORAL | 0 refills | Status: DC

## 2018-08-21 MED ORDER — OMEPRAZOLE 20 MG PO CPDR: 20.0000 mg | DELAYED_RELEASE_CAPSULE | Freq: Every day | ORAL | 0 refills | Status: DC

## 2018-08-21 MED ORDER — LOSARTAN POTASSIUM 25 MG PO TABS: 25.0000 mg | ORAL_TABLET | Freq: Every day | ORAL | 0 refills | Status: DC

## 2018-08-21 MED ORDER — METOPROLOL SUCCINATE ER 50 MG PO TB24: 50.0000 mg | ORAL_TABLET | Freq: Every day | ORAL | 0 refills | Status: DC

## 2018-08-21 MED ORDER — HYDROCHLOROTHIAZIDE 12.5 MG PO CAPS: 12.5000 mg | ORAL_CAPSULE | Freq: Every day | ORAL | 0 refills | Status: DC

## 2018-09-10 ENCOUNTER — Other Ambulatory Visit: Payer: Self-pay

## 2018-09-10 ENCOUNTER — Encounter: Payer: Self-pay | Admitting: Medical

## 2018-09-10 ENCOUNTER — Ambulatory Visit: Payer: Medicare Other | Admitting: Medical

## 2018-09-10 VITALS — BP 123/73 | HR 66 | Resp 12 | Ht 64.0 in | Wt 180.0 lb

## 2018-09-10 DIAGNOSIS — N183 Chronic kidney disease, stage 3 unspecified: Secondary | ICD-10-CM

## 2018-09-10 DIAGNOSIS — E1159 Type 2 diabetes mellitus with other circulatory complications: Secondary | ICD-10-CM

## 2018-09-10 DIAGNOSIS — I10 Essential (primary) hypertension: Secondary | ICD-10-CM

## 2018-09-10 DIAGNOSIS — E1169 Type 2 diabetes mellitus with other specified complication: Secondary | ICD-10-CM | POA: Diagnosis not present

## 2018-09-10 DIAGNOSIS — E785 Hyperlipidemia, unspecified: Secondary | ICD-10-CM

## 2018-09-10 DIAGNOSIS — E1165 Type 2 diabetes mellitus with hyperglycemia: Secondary | ICD-10-CM | POA: Diagnosis not present

## 2018-09-10 MED ORDER — LOSARTAN POTASSIUM 25 MG PO TABS: 25.0000 mg | ORAL_TABLET | Freq: Every day | ORAL | 1 refills | Status: DC

## 2018-09-10 MED ORDER — OMEPRAZOLE 20 MG PO CPDR: 20.0000 mg | DELAYED_RELEASE_CAPSULE | Freq: Every day | ORAL | 1 refills | Status: DC

## 2018-09-10 MED ORDER — METOPROLOL SUCCINATE ER 50 MG PO TB24: 50.0000 mg | ORAL_TABLET | Freq: Every day | ORAL | 1 refills | Status: DC

## 2018-09-10 MED ORDER — DULAGLUTIDE 0.75 MG/0.5ML ~~LOC~~ SOAJ: 0.7500 mg | SUBCUTANEOUS | 3 refills | Status: DC

## 2018-09-10 MED ORDER — ATORVASTATIN CALCIUM 40 MG PO TABS: 40.0000 mg | ORAL_TABLET | Freq: Every day | ORAL | 1 refills | Status: DC

## 2018-09-10 MED ORDER — HYDROCHLOROTHIAZIDE 12.5 MG PO CAPS: 12.5000 mg | ORAL_CAPSULE | Freq: Every day | ORAL | 1 refills | Status: DC

## 2018-09-10 MED ORDER — AMLODIPINE BESYLATE 10 MG PO TABS: 10.0000 mg | ORAL_TABLET | Freq: Every day | ORAL | 1 refills | Status: DC

## 2018-09-10 NOTE — Patient Instructions (Addendum)
Your sugars/diabetes  have recently been better on current med regimen. Will get a1c tomorrow. Make adjustments to regimen if needed.  For high cholesterol will get lipid panel fasting tomorrow along with cmp.  For chronic kidney disease follow CR and GFR.  For htn, continue current bp meds.  Follow up date to be determined after lab review or as needed.

## 2018-09-10 NOTE — Progress Notes (Signed)
Subjective:    Patient ID: Olivia Morales, female    DOB: 09-Nov-1950, 68 y.o.   MRN: [id number]  HPI  Pt in for follow up. She states she has been feeling well.  Pt has diabetes. She states her sugars have been between 100-140 when she checks. One time got 180 eating candy. Pt a1c high last time checked at 8.3.  Pt has future labs already in place. She did not fast today. Will get fasting labs tomorrow morning.  Mild low hdl in past. Other lipid markers good on last check.  BP bp 110/67 today.    Review of Systems  Constitutional: Negative for chills, fatigue and fever.  Respiratory: Negative for cough, chest tightness, shortness of breath and wheezing.   Cardiovascular: Negative for chest pain and palpitations.  Gastrointestinal: Negative for abdominal pain.  Musculoskeletal: Negative for back pain and neck pain.  Skin: Negative for rash.  Neurological: Negative for dizziness, weakness, numbness and headaches.  Hematological: Negative for adenopathy. Does not bruise/bleed easily.  Psychiatric/Behavioral: Negative for behavioral problems, confusion, dysphoric mood and sleep disturbance. The patient is not nervous/anxious.     Past Medical History:  Diagnosis Date  . Diabetes mellitus   . Hypercholesteremia   . Hypertension   . Sleep apnea      Social History   Socioeconomic History  . Marital status: Widowed    Spouse name: Not on file  . Number of children: Not on file  . Years of education: Not on file  . Highest education level: Not on file  Occupational History  . Not on file  Social Needs  . Financial resource strain: Not on file  . Food insecurity:    Worry: Not on file    Inability: Not on file  . Transportation needs:    Medical: Not on file    Non-medical: Not on file  Tobacco Use  . Smoking status: Never Smoker  . Smokeless tobacco: Never Used  Substance and Sexual Activity  . Alcohol use: Yes    Alcohol/week: 14.0 standard drinks   Types: 14 Cans of beer per week  . Drug use: No  . Sexual activity: Yes  Lifestyle  . Physical activity:    Days per week: Not on file    Minutes per session: Not on file  . Stress: Not on file  Relationships  . Social connections:    Talks on phone: Not on file    Gets together: Not on file    Attends religious service: Not on file    Active member of club or organization: Not on file    Attends meetings of clubs or organizations: Not on file    Relationship status: Not on file  . Intimate partner violence:    Fear of current or ex partner: Not on file    Emotionally abused: Not on file    Physically abused: Not on file    Forced sexual activity: Not on file  Other Topics Concern  . Not on file  Social History Narrative  . Not on file    Past Surgical History:  Procedure Laterality Date  . TUBAL LIGATION      Family History  Problem Relation Age of Onset  . Sickle cell anemia Father     Allergies  Allergen Reactions  . Ace Inhibitors     Cough    Current Outpatient Medications on File Prior to Visit  Medication Sig Dispense Refill  . blood glucose meter kit and  supplies Check blood sugar 1-2 times a day (FOR ICD-E11.9). 1 each 0  . diphenhydramine-acetaminophen (TYLENOL PM) 25-500 MG TABS Take 2 tablets by mouth every 4 (four) hours as needed (pain).    . Lancets (ONETOUCH DELICA PLUS [id number]) MISC USE TO TEST 1-2 TIMES DAILY 100 each 0  . ONETOUCH VERIO test strip Check blood sugar 1-2 times daily 100 each 12  . PRESCRIPTION MEDICATION Place 1 drop into both eyes daily. For dry eye and allergies.     No current facility-administered medications on file prior to visit.     BP 123/73 (BP Location: Right Arm, Patient Position: Sitting, Cuff Size: Normal)   Pulse 66   Resp 12   Ht 5' 4" (1.626 m)   Wt 180 lb (81.6 kg)   SpO2 99%   BMI 30.90 kg/m       Objective:   Physical Exam  General Mental Status- Alert. General Appearance- Not in acute  distress.   Skin General: Color- Normal Color. Moisture- Normal Moisture.  Neck Carotid Arteries- Normal color. Moisture- Normal Moisture. No carotid bruits. No JVD.  Chest and Lung Exam Auscultation: Breath Sounds:-Normal.  Cardiovascular Auscultation:Rythm- Regular. Murmurs & Other Heart Sounds:Auscultation of the heart reveals- No Murmurs.  Abdomen Inspection:-Inspeection Normal. Palpation/Percussion:Note:No mass. Palpation and Percussion of the abdomen reveal- Non Tender, Non Distended + BS, no rebound or guarding.  Neurologic Cranial Nerve exam:- CN III-XII intact(No nystagmus), symmetric smile. Strength:- 5/5 equal and symmetric strength both upper and lower extremities.      Assessment & Plan:  Your sugars/diabetes  have recently been better on current med regimen. Will get a1c tomorrow. Make adjustments to regimen if needed.  For high cholesterol will get lipid panel fasting tomorrow along with cmp.  For chronic kidney disease follow CR and GFR.  For htn, continue current bp meds.  Follow up date to be determined after lab review or as needed.  Pt states she had one shingrix injection but someone called and states she did not need another. Will send message to staff to check?  Mackie Pai, PA-C

## 2018-09-11 ENCOUNTER — Telehealth: Payer: Self-pay | Admitting: Medical

## 2018-09-11 ENCOUNTER — Other Ambulatory Visit (INDEPENDENT_AMBULATORY_CARE_PROVIDER_SITE_OTHER): Payer: Medicare Other

## 2018-09-11 DIAGNOSIS — E1165 Type 2 diabetes mellitus with hyperglycemia: Secondary | ICD-10-CM

## 2018-09-11 DIAGNOSIS — R5383 Other fatigue: Secondary | ICD-10-CM | POA: Diagnosis not present

## 2018-09-11 DIAGNOSIS — E1169 Type 2 diabetes mellitus with other specified complication: Secondary | ICD-10-CM | POA: Diagnosis not present

## 2018-09-11 DIAGNOSIS — E785 Hyperlipidemia, unspecified: Secondary | ICD-10-CM

## 2018-09-11 DIAGNOSIS — Z113 Encounter for screening for infections with a predominantly sexual mode of transmission: Secondary | ICD-10-CM

## 2018-09-11 LAB — CBC WITH DIFFERENTIAL/PLATELET
Basophils Absolute: 0 10*3/uL (ref 0.0–0.1)
Basophils Relative: 0.7 % (ref 0.0–3.0)
Eosinophils Absolute: 0.1 10*3/uL (ref 0.0–0.7)
Eosinophils Relative: 1.9 % (ref 0.0–5.0)
HCT: 33.1 % — ABNORMAL LOW (ref 36.0–46.0)
Hemoglobin: 10.8 g/dL — ABNORMAL LOW (ref 12.0–15.0)
LYMPHS PCT: 20.6 % (ref 12.0–46.0)
Lymphs Abs: 1.4 10*3/uL (ref 0.7–4.0)
MCHC: 32.5 g/dL (ref 30.0–36.0)
MCV: 83.7 fl (ref 78.0–100.0)
Monocytes Absolute: 0.3 10*3/uL (ref 0.1–1.0)
Monocytes Relative: 4.6 % (ref 3.0–12.0)
Neutro Abs: 5 10*3/uL (ref 1.4–7.7)
Neutrophils Relative %: 72.2 % (ref 43.0–77.0)
Platelets: 319 10*3/uL (ref 150.0–400.0)
RBC: 3.96 Mil/uL (ref 3.87–5.11)
RDW: 14 % (ref 11.5–15.5)
WBC: 7 10*3/uL (ref 4.0–10.5)

## 2018-09-11 LAB — COMPREHENSIVE METABOLIC PANEL
ALK PHOS: 89 U/L (ref 39–117)
ALT: 8 U/L (ref 0–35)
AST: 15 U/L (ref 0–37)
Albumin: 4.7 g/dL (ref 3.5–5.2)
BUN: 26 mg/dL — ABNORMAL HIGH (ref 6–23)
CO2: 27 mEq/L (ref 19–32)
Calcium: 10.3 mg/dL (ref 8.4–10.5)
Chloride: 101 mEq/L (ref 96–112)
Creatinine, Ser: 1.77 mg/dL — ABNORMAL HIGH (ref 0.40–1.20)
GFR: 34.72 mL/min — ABNORMAL LOW (ref >60.00)
GLUCOSE: 78 mg/dL (ref 70–99)
Potassium: 3.3 mEq/L — ABNORMAL LOW (ref 3.5–5.1)
Sodium: 140 mEq/L (ref 135–145)
TOTAL PROTEIN: 7.6 g/dL (ref 6.0–8.3)
Total Bilirubin: 0.8 mg/dL (ref 0.2–1.2)

## 2018-09-11 LAB — LIPID PANEL
CHOLESTEROL: 147 mg/dL (ref 0–200)
HDL: 38.5 mg/dL — ABNORMAL LOW (ref >39.00)
LDL CALC: 85 mg/dL (ref 0–99)
NonHDL: 108.14
Total CHOL/HDL Ratio: 4
Triglycerides: 116 mg/dL (ref 0.0–149.0)
VLDL: 23.2 mg/dL (ref 0.0–40.0)

## 2018-09-11 LAB — TSH: TSH: 1.99 u[IU]/mL (ref 0.35–4.50)

## 2018-09-11 LAB — VITAMIN B12: Vitamin B-12: 108 pg/mL — ABNORMAL LOW (ref 211–911)

## 2018-09-11 LAB — HEMOGLOBIN A1C: Hgb A1c MFr Bld: 6 % (ref 4.6–6.5)

## 2018-09-11 MED ORDER — POTASSIUM CHLORIDE CRYS ER 20 MEQ PO TBCR: 20.0000 meq | EXTENDED_RELEASE_TABLET | Freq: Every day | ORAL | 0 refills | Status: DC

## 2018-09-11 NOTE — Telephone Encounter (Signed)
Sent kdur to [employer].

## 2018-09-15 LAB — HIV ANTIBODY (ROUTINE TESTING W REFLEX): HIV 1&2 Ab, 4th Generation: NONREACTIVE

## 2018-09-15 LAB — VITAMIN B1: VITAMIN B1 (THIAMINE): 19 nmol/L (ref 8–30)

## 2018-12-30 ENCOUNTER — Other Ambulatory Visit: Payer: Self-pay | Admitting: Medical

## 2019-01-27 ENCOUNTER — Ambulatory Visit: Payer: Self-pay | Admitting: *Deleted

## 2019-01-27 ENCOUNTER — Encounter: Payer: Self-pay | Admitting: Medical

## 2019-01-27 ENCOUNTER — Ambulatory Visit (INDEPENDENT_AMBULATORY_CARE_PROVIDER_SITE_OTHER): Payer: Medicare Other | Admitting: Medical

## 2019-01-27 ENCOUNTER — Other Ambulatory Visit: Payer: Self-pay

## 2019-01-27 VITALS — BP 124/67 | HR 63

## 2019-01-27 DIAGNOSIS — M542 Cervicalgia: Secondary | ICD-10-CM | POA: Diagnosis not present

## 2019-01-27 DIAGNOSIS — M792 Neuralgia and neuritis, unspecified: Secondary | ICD-10-CM | POA: Diagnosis not present

## 2019-01-27 NOTE — Progress Notes (Signed)
Subjective:    Patient ID: Olivia Morales, female    DOB: 04-Nov-1950, 68 y.o.   MRN: [id number]  HPI Virtual Visit via Video Note  I connected with Olivia Morales on 01/27/19 at  4:20 PM EDT by a video enabled telemedicine application and verified that I am speaking with the correct person using two identifiers.  Location: Patient: home Provider: home   I discussed the limitations of evaluation and management by telemedicine and the availability of in person appointments. The patient expressed understanding and agreed to proceed.  History of Present Illness: Pt has some tingling down her left arm. At times from shoulder to tip of her fingers. Today some elbow to tip of fingers. When she turns her head to the left she has some neck pain. Pt around time her symptoms started she ws cooking for  3 days.   No cardiac or neurologic signs or symptoms.   Pt has intermittent transient symptoms.   On review, no chest pain, no sob, no jaw pain, no palpitations or diaphoresis.  Pt last a1c very controlled just recently,.     Observations/Objective:  General-no acute distress, pleasant, oriented. Lungs- on inspection lungs appear unlabored. Neck- no tracheal deviation or jvd on inspection. Neuro- gross motor function appears intact. Turning head to left causes mild neck pain.  Left upper ext- negative phalens signs Left shoulder- good rom. No pain.   Assessment and Plan: For radicular/neuropathic type pain in upper ext with some neck pain, on exam will get cervical spine xray and give you 4 day taper dose prednisone. Will follow xray and notify you of the result.  While on prednisone make sure you check your sugar, eat low sugar diet and take your diabetic meds. If sugars increase over 200 let us know.  Follow up in 10-14 days or as needed   Follow Up Instructions:    I discussed the assessment and treatment plan with the patient. The patient was provided an  opportunity to ask questions and all were answered. The patient agreed with the plan and demonstrated an understanding of the instructions.   The patient was advised to call back or seek an in-person evaluation if the symptoms worsen or if the condition fails to improve as anticipated.  I provided 25 minutes of non-face-to-face time during this encounter.   Mackie Pai, PA-C    Review of Systems  Constitutional: Negative for chills, fatigue and fever.  Respiratory: Negative for cough, chest tightness, shortness of breath and wheezing.   Cardiovascular: Negative for chest pain and palpitations.  Gastrointestinal: Negative for abdominal pain.  Musculoskeletal: Positive for neck pain.  Skin: Negative for rash.  Neurological: Negative for dizziness, speech difficulty, weakness and headaches.       Left upper ext radicular type pain. Nerve type pain.  Hematological: Negative for adenopathy. Does not bruise/bleed easily.  Psychiatric/Behavioral: Negative for behavioral problems and confusion.    Past Medical History:  Diagnosis Date  . Diabetes mellitus   . Hypercholesteremia   . Hypertension   . Sleep apnea      Social History   Socioeconomic History  . Marital status: Widowed    Spouse name: Not on file  . Number of children: Not on file  . Years of education: Not on file  . Highest education level: Not on file  Occupational History  . Not on file  Social Needs  . Financial resource strain: Not on file  . Food insecurity    Worry: Not  on file    Inability: Not on file  . Transportation needs    Medical: Not on file    Non-medical: Not on file  Tobacco Use  . Smoking status: Never Smoker  . Smokeless tobacco: Never Used  Substance and Sexual Activity  . Alcohol use: Yes    Alcohol/week: 14.0 standard drinks    Types: 14 Cans of beer per week  . Drug use: No  . Sexual activity: Yes  Lifestyle  . Physical activity    Days per week: Not on file    Minutes per  session: Not on file  . Stress: Not on file  Relationships  . Social Herbalist on phone: Not on file    Gets together: Not on file    Attends religious service: Not on file    Active member of club or organization: Not on file    Attends meetings of clubs or organizations: Not on file    Relationship status: Not on file  . Intimate partner violence    Fear of current or ex partner: Not on file    Emotionally abused: Not on file    Physically abused: Not on file    Forced sexual activity: Not on file  Other Topics Concern  . Not on file  Social History Narrative  . Not on file    Past Surgical History:  Procedure Laterality Date  . TUBAL LIGATION      Family History  Problem Relation Age of Onset  . Sickle cell anemia Father     Allergies  Allergen Reactions  . Ace Inhibitors     Cough    Current Outpatient Medications on File Prior to Visit  Medication Sig Dispense Refill  . amLODipine (NORVASC) 10 MG tablet Take 1 tablet (10 mg total) by mouth daily. 90 tablet 1  . atorvastatin (LIPITOR) 40 MG tablet Take 1 tablet (40 mg total) by mouth daily. 90 tablet 1  . blood glucose meter kit and supplies Check blood sugar 1-2 times a day (FOR ICD-E11.9). 1 each 0  . diphenhydramine-acetaminophen (TYLENOL PM) 25-500 MG TABS Take 2 tablets by mouth every 4 (four) hours as needed (pain).    . hydrochlorothiazide (MICROZIDE) 12.5 MG capsule Take 1 capsule (12.5 mg total) by mouth daily. 90 capsule 1  . Lancets (ONETOUCH DELICA PLUS [id number]) MISC USE TO TEST 1-2 TIMES DAILY 100 each 0  . losartan (COZAAR) 25 MG tablet Take 1 tablet (25 mg total) by mouth daily. 90 tablet 1  . metoprolol succinate (TOPROL-XL) 50 MG 24 hr tablet Take 1 tablet (50 mg total) by mouth daily. 90 tablet 1  . omeprazole (PRILOSEC) 20 MG capsule Take 1 capsule (20 mg total) by mouth daily. 90 capsule 1  . ONETOUCH VERIO test strip Check blood sugar 1-2 times daily 100 each 12  . potassium  chloride SA (K-DUR,KLOR-CON) 20 MEQ tablet Take 1 tablet (20 mEq total) by mouth daily. 5 tablet 0  . PRESCRIPTION MEDICATION Place 1 drop into both eyes daily. For dry eye and allergies.    . TRULICITY 4.48 JE/5.6DJ SOPN INJECT 0.75 MG UNDER THE SKIN ONCE A WEEK 2 mL 1   No current facility-administered medications on file prior to visit.     BP 124/67   Pulse 63       Objective:   Physical Exam        Assessment & Plan:

## 2019-01-27 NOTE — Telephone Encounter (Signed)
Summary: tingling in arms    Patient requesting call back from NT to discuss a "tingling" sensation in her arms. Patient denies any other symptoms.      Patient is having tingling in left arm for over 1 week- she has sensation in shoulder to fingertips. Comes and goes- 2 times/day for the last week. Patient is not having any other symptoms- no weakness, vision changes, chest pain. Patient admits she is not checking her glucose levels. Call to office- they are going to see her first to evaluate her before having her go to ED.  Reason for Disposition . Numbness (i.e., loss of sensation) in hand or fingers (Exception: just tingling; numbness present > 2 weeks) . [1] Numbness (i.e., loss of sensation) of the face, arm / hand, or leg / foot on one side of the body AND [2] sudden onset AND [3] brief (now gone)  Answer Assessment - Initial Assessment Questions 1. SYMPTOM: "What is the main symptom you are concerned about?" (e.g., weakness, numbness)     Numbness and tingling- on left- painful, patient also has cramp in right thigh- so painful she has to sit- do not happen at same time 2. ONSET: "When did this start?" (minutes, hours, days; while sleeping)     Over 1 week 3. LAST NORMAL: "When was the last time you were normal (no symptoms)?"     Over 1 wek 4. PATTERN "Does this come and go, or has it been constant since it started?"  "Is it present now?"     Comes and goes, happened a few minutes ago 5. CARDIAC SYMPTOMS: "Have you had any of the following symptoms: chest pain, difficulty breathing, palpitations?"     no 6. NEUROLOGIC SYMPTOMS: "Have you had any of the following symptoms: headache, dizziness, vision loss, double vision, changes in speech, unsteady on your feet?"     no 7. OTHER SYMPTOMS: "Do you have any other symptoms?"     no 8. PREGNANCY: "Is there any chance you are pregnant?" "When was your last menstrual period?"     n/a  Answer Assessment - Initial Assessment  Questions 1. ONSET: "When did the pain start?"     Over 1 week 2. LOCATION: "Where is the pain located?"     Comes and goes- pins and needles- can be from shoulder to fingers/ elbow to fingers 3. PAIN: "How bad is the pain?" (Scale 1-10; or mild, moderate, severe)   - MILD (1-3): doesn't interfere with normal activities   - MODERATE (4-7): interferes with normal activities (e.g., work or school) or awakens from sleep   - SEVERE (8-10): excruciating pain, unable to use hand at all     10 when occuring 4. WORK OR EXERCISE: "Has there been any recent work or exercise that involved this part of the body?"     no 5. CAUSE: "What do you think is causing the pain?"     unknown 6. AGGRAVATING FACTORS: "What makes the pain worse?" (e.g., using computer)     no 7. OTHER SYMPTOMS: "Do you have any other symptoms?" (e.g., neck pain, swelling, rash, numbness, fever)     Occasional cramping in thigh of R leg- but not at same time, patient did have back pain that lasted 3 days- now gone 8. PREGNANCY: "Is there any chance you are pregnant?" "When was your last menstrual period?"     n/a  Protocols used: HAND AND WRIST PAIN-A-AH, NEUROLOGIC DEFICIT-A-AH

## 2019-01-27 NOTE — Patient Instructions (Signed)
For radicular/neuropathic type pain in upper ext with some neck pain, on exam will get cervical spine xray and give you 4 day taper dose prednisone. Will follow xray and notify you of the result.  While on prednisone make sure you check your sugar, eat low sugar diet and take your diabetic meds. If sugars increase over 200 let us know.  Follow up in 10-14 days or as needed

## 2019-01-28 ENCOUNTER — Ambulatory Visit (HOSPITAL_BASED_OUTPATIENT_CLINIC_OR_DEPARTMENT_OTHER)
Admission: RE | Admit: 2019-01-28 | Discharge: 2019-01-28 | Disposition: A | Discharge status: Home / Self Care | Payer: Medicare Other | Source: Ambulatory Visit | Attending: Medical | Admitting: Medical

## 2019-01-28 DIAGNOSIS — M792 Neuralgia and neuritis, unspecified: Secondary | ICD-10-CM

## 2019-01-28 DIAGNOSIS — M542 Cervicalgia: Secondary | ICD-10-CM | POA: Diagnosis not present

## 2019-01-28 MED ORDER — PREDNISONE 10 MG PO TABS: ORAL_TABLET | ORAL | 0 refills | Status: DC

## 2019-01-28 MED FILL — predniSONE 10 MG TABS: 10 | 4 days supply | Qty: 10 | Fill #0

## 2019-01-28 NOTE — Addendum Note (Signed)
Addended by: Anabel Halon on: 01/28/2019 01:08 PM   Modules accepted: Orders

## 2019-01-28 NOTE — Addendum Note (Signed)
Addended by: Anabel Halon on: 01/28/2019 12:38 PM   Modules accepted: Orders

## 2019-01-29 ENCOUNTER — Telehealth: Payer: Self-pay | Admitting: Medical

## 2019-01-29 DIAGNOSIS — M792 Neuralgia and neuritis, unspecified: Secondary | ICD-10-CM

## 2019-01-29 NOTE — Telephone Encounter (Signed)
Referral sports medicine placed  

## 2019-01-31 ENCOUNTER — Telehealth: Payer: Self-pay | Admitting: Medical

## 2019-01-31 DIAGNOSIS — E785 Hyperlipidemia, unspecified: Secondary | ICD-10-CM

## 2019-01-31 DIAGNOSIS — E1169 Type 2 diabetes mellitus with other specified complication: Secondary | ICD-10-CM

## 2019-01-31 DIAGNOSIS — E1159 Type 2 diabetes mellitus with other circulatory complications: Secondary | ICD-10-CM

## 2019-01-31 DIAGNOSIS — N183 Chronic kidney disease, stage 3 unspecified: Secondary | ICD-10-CM

## 2019-01-31 DIAGNOSIS — I152 Hypertension secondary to endocrine disorders: Secondary | ICD-10-CM

## 2019-01-31 DIAGNOSIS — E1165 Type 2 diabetes mellitus with hyperglycemia: Secondary | ICD-10-CM

## 2019-01-31 NOTE — Telephone Encounter (Signed)
Future labs placed. Called pt today. Advised her to call and scheudule future fasting labs.

## 2019-02-12 ENCOUNTER — Ambulatory Visit: Payer: Medicare Other | Admitting: Family Medicine

## 2019-02-12 NOTE — Progress Notes (Deleted)
  Olivia Morales - 68 y.o. female MRN [id number]  Date of birth: Sep 02, 1950  SUBJECTIVE:  Including CC & ROS.  No chief complaint on file.   Olivia Morales is a 68 y.o. female that is  ***.  Independent review of the cervical spine x-ray from 7/29 shows mild degenerative changes and loss of the lordosis.   Review of Systems  HISTORY: Past Medical, Surgical, Social, and Family History Reviewed & Updated per EMR.   Pertinent Historical Findings include:  Past Medical History:  Diagnosis Date  . Diabetes mellitus   . Hypercholesteremia   . Hypertension   . Sleep apnea     Past Surgical History:  Procedure Laterality Date  . TUBAL LIGATION      Allergies  Allergen Reactions  . Ace Inhibitors     Cough    Family History  Problem Relation Age of Onset  . Sickle cell anemia Father      Social History   Socioeconomic History  . Marital status: Widowed    Spouse name: Not on file  . Number of children: Not on file  . Years of education: Not on file  . Highest education level: Not on file  Occupational History  . Not on file  Social Needs  . Financial resource strain: Not on file  . Food insecurity    Worry: Not on file    Inability: Not on file  . Transportation needs    Medical: Not on file    Non-medical: Not on file  Tobacco Use  . Smoking status: Never Smoker  . Smokeless tobacco: Never Used  Substance and Sexual Activity  . Alcohol use: Yes    Alcohol/week: 14.0 standard drinks    Types: 14 Cans of beer per week  . Drug use: No  . Sexual activity: Yes  Lifestyle  . Physical activity    Days per week: Not on file    Minutes per session: Not on file  . Stress: Not on file  Relationships  . Social Herbalist on phone: Not on file    Gets together: Not on file    Attends religious service: Not on file    Active member of club or organization: Not on file    Attends meetings of clubs or organizations: Not on file   Relationship status: Not on file  . Intimate partner violence    Fear of current or ex partner: Not on file    Emotionally abused: Not on file    Physically abused: Not on file    Forced sexual activity: Not on file  Other Topics Concern  . Not on file  Social History Narrative  . Not on file     PHYSICAL EXAM:  VS: There were no vitals taken for this visit. Physical Exam Gen: NAD, alert, cooperative with exam, well-appearing ENT: normal lips, normal nasal mucosa,  Eye: normal EOM, normal conjunctiva and lids CV:  no edema, +2 pedal pulses   Resp: no accessory muscle use, non-labored,  GI: no masses or tenderness, no hernia  Skin: no rashes, no areas of induration  Neuro: normal tone, normal sensation to touch Psych:  normal insight, alert and oriented MSK:  ***      ASSESSMENT & PLAN:   No problem-specific Assessment & Plan notes found for this encounter.

## 2019-03-24 ENCOUNTER — Other Ambulatory Visit: Payer: Self-pay | Admitting: Medical

## 2019-03-29 ENCOUNTER — Telehealth: Payer: Self-pay | Admitting: Medical

## 2019-03-29 NOTE — Telephone Encounter (Signed)
Offer virtual or office visit. Recommend fasting lipid panel and flu vaccine. Also a1c. Please get scheduled 

## 2019-03-29 NOTE — Telephone Encounter (Signed)
Offer virtual or office visit. Recommend fasting lipid panel and flu vaccine. Also a1c. Please get scheduled

## 2019-03-31 NOTE — Telephone Encounter (Signed)
Ok. Will wait for results. She can have virtual visit if she wants.

## 2019-03-31 NOTE — Telephone Encounter (Signed)
Pt states that her daughter has tested positive for COVID and was at her house Sunday. Advised pt that we cannot have her come into the office at this time. Pt is going to be getting tested (at non-[facility] facility) and will call with results.   Pt notes her left arm is still hurting but pt was too scared to go to Sports Med.

## 2019-04-02 ENCOUNTER — Other Ambulatory Visit: Payer: Self-pay

## 2019-04-02 DIAGNOSIS — Z20822 Contact with and (suspected) exposure to covid-19: Secondary | ICD-10-CM

## 2019-04-03 LAB — NOVEL CORONAVIRUS, NAA: SARS-CoV-2, NAA: NOT DETECTED

## 2019-04-06 ENCOUNTER — Telehealth: Payer: Self-pay

## 2019-04-06 NOTE — Telephone Encounter (Signed)
Copied from [place] [id number]. Topic: General - Other >> Apr 06, 2019 12:57 PM Leward Quan A wrote: Reason for CRM: Patient called to say that covid test result are negative asking how soon can she be seen for her A1c test and other blood work. Please advise

## 2019-04-06 NOTE — Telephone Encounter (Signed)
Pt need follow up appointment.  

## 2019-04-10 ENCOUNTER — Other Ambulatory Visit: Payer: Self-pay | Admitting: Medical

## 2019-04-15 ENCOUNTER — Other Ambulatory Visit: Payer: Self-pay | Admitting: *Deleted

## 2019-04-15 MED ORDER — TRULICITY 0.75 MG/0.5ML ~~LOC~~ SOAJ: SUBCUTANEOUS | 1 refills | Status: DC

## 2019-04-16 ENCOUNTER — Ambulatory Visit: Payer: Medicare Other | Admitting: Medical

## 2019-04-16 ENCOUNTER — Telehealth: Payer: Self-pay

## 2019-04-16 NOTE — Telephone Encounter (Signed)
What is going on with pt? If not feeling well then needs to be seen? Can be in office or virtual depending on what is going on?

## 2019-04-16 NOTE — Telephone Encounter (Signed)
Copied from [place] [phone]. Topic: Quick Communication - Appointment Cancellation >> Apr 16, 2019  7:28 AM Lennox Solders wrote: Patient called to cancel appointment scheduled for [place]. Patient HAS  rescheduled their appointment. Pt is not feeling well  Route to department's PEC pool.

## 2019-04-17 NOTE — Telephone Encounter (Signed)
Pt states she doesn't need a virtual visit she just needed some rest. Pt rescheduled for 04/23/19. Pt state she will be at her appointment next week.

## 2019-04-23 ENCOUNTER — Other Ambulatory Visit: Payer: Self-pay

## 2019-04-23 ENCOUNTER — Encounter: Payer: Self-pay | Admitting: Medical

## 2019-04-23 ENCOUNTER — Ambulatory Visit: Payer: Medicare Other | Admitting: Medical

## 2019-04-23 VITALS — BP 136/82 | HR 65 | Temp 96.9°F | Resp 16 | Ht 64.0 in | Wt 181.8 lb

## 2019-04-23 DIAGNOSIS — M722 Plantar fascial fibromatosis: Secondary | ICD-10-CM

## 2019-04-23 DIAGNOSIS — E785 Hyperlipidemia, unspecified: Secondary | ICD-10-CM | POA: Diagnosis not present

## 2019-04-23 DIAGNOSIS — E1165 Type 2 diabetes mellitus with hyperglycemia: Secondary | ICD-10-CM | POA: Diagnosis not present

## 2019-04-23 DIAGNOSIS — R944 Abnormal results of kidney function studies: Secondary | ICD-10-CM

## 2019-04-23 DIAGNOSIS — E119 Type 2 diabetes mellitus without complications: Secondary | ICD-10-CM

## 2019-04-23 DIAGNOSIS — Z23 Encounter for immunization: Secondary | ICD-10-CM | POA: Diagnosis not present

## 2019-04-23 DIAGNOSIS — I10 Essential (primary) hypertension: Secondary | ICD-10-CM

## 2019-04-23 DIAGNOSIS — E1169 Type 2 diabetes mellitus with other specified complication: Secondary | ICD-10-CM | POA: Diagnosis not present

## 2019-04-23 LAB — COMPREHENSIVE METABOLIC PANEL
ALT: 11 U/L (ref 0–35)
AST: 16 U/L (ref 0–37)
Albumin: 4.4 g/dL (ref 3.5–5.2)
Alkaline Phosphatase: 90 U/L (ref 39–117)
BUN: 24 mg/dL — ABNORMAL HIGH (ref 6–23)
CO2: 30 mEq/L (ref 19–32)
Calcium: 9.8 mg/dL (ref 8.4–10.5)
Chloride: 104 mEq/L (ref 96–112)
Creatinine, Ser: 1.42 mg/dL — ABNORMAL HIGH (ref 0.40–1.20)
GFR: 44.69 mL/min — ABNORMAL LOW (ref >60.00)
Glucose, Bld: 91 mg/dL (ref 70–99)
Potassium: 4.5 mEq/L (ref 3.5–5.1)
Sodium: 140 mEq/L (ref 135–145)
Total Bilirubin: 0.7 mg/dL (ref 0.2–1.2)
Total Protein: 7.1 g/dL (ref 6.0–8.3)

## 2019-04-23 LAB — LIPID PANEL
Cholesterol: 143 mg/dL (ref 0–200)
HDL: 42.1 mg/dL (ref >39.00)
LDL Cholesterol: 83 mg/dL (ref 0–99)
NonHDL: 100.99
Total CHOL/HDL Ratio: 3
Triglycerides: 89 mg/dL (ref 0.0–149.0)
VLDL: 17.8 mg/dL (ref 0.0–40.0)

## 2019-04-23 LAB — HEMOGLOBIN A1C: Hgb A1c MFr Bld: 5.9 % (ref 4.6–6.5)

## 2019-04-23 NOTE — Progress Notes (Signed)
Subjective:    Patient ID: Olivia Morales, female    DOB: [birth year], 68 y.o.   MRN: [id number]  HPI  Pt in for follow up.  Pt in for follow up. She states she has been feeling well.  Pt has diabetes. She states no sugar checks in past month. . Pt a1c high last time checked at 6.0  Pt has future labs already in place. She did not fast today. Will get fasting labs tomorrow morning.  Mild low hdl in past. Other lipid markers good on last check.  BP bp  136/82 today.   Pt got flu vaccine today.  Pt states thinks she got mammogram this year. She is not sure where.   Pt states former neck pain in summer and pain to arm now resolved. She state referral to sport md not necessary  Pt has hx of plantar fascitis/mild pain on and off. Diabetic and she also wants diabetic shows.   Review of Systems  Constitutional: Negative for chills, fatigue and fever.  HENT: Negative for congestion, drooling, ear pain, mouth sores, postnasal drip, rhinorrhea, sinus pressure and sinus pain.   Respiratory: Negative for cough, chest tightness, shortness of breath and wheezing.   Cardiovascular: Negative for chest pain and palpitations.  Gastrointestinal: Negative for abdominal pain, diarrhea and nausea.  Musculoskeletal: Negative for arthralgias, back pain and myalgias.  Skin: Negative for rash.  Neurological: Negative for dizziness, weakness, numbness and headaches.  Hematological: Negative for adenopathy. Does not bruise/bleed easily.  Psychiatric/Behavioral: Negative for behavioral problems and confusion.    Past Medical History:  Diagnosis Date  . Diabetes mellitus   . Hypercholesteremia   . Hypertension   . Sleep apnea      Social History   Socioeconomic History  . Marital status: Widowed    Spouse name: Not on file  . Number of children: Not on file  . Years of education: Not on file  . Highest education level: Not on file  Occupational History  . Not on file  Social  Needs  . Financial resource strain: Not on file  . Food insecurity    Worry: Not on file    Inability: Not on file  . Transportation needs    Medical: Not on file    Non-medical: Not on file  Tobacco Use  . Smoking status: Never Smoker  . Smokeless tobacco: Never Used  Substance and Sexual Activity  . Alcohol use: Yes    Alcohol/week: 14.0 standard drinks    Types: 14 Cans of beer per week  . Drug use: No  . Sexual activity: Yes  Lifestyle  . Physical activity    Days per week: Not on file    Minutes per session: Not on file  . Stress: Not on file  Relationships  . Social Herbalist on phone: Not on file    Gets together: Not on file    Attends religious service: Not on file    Active member of club or organization: Not on file    Attends meetings of clubs or organizations: Not on file    Relationship status: Not on file  . Intimate partner violence    Fear of current or ex partner: Not on file    Emotionally abused: Not on file    Physically abused: Not on file    Forced sexual activity: Not on file  Other Topics Concern  . Not on file  Social History Narrative  . Not on  file    Past Surgical History:  Procedure Laterality Date  . TUBAL LIGATION      Family History  Problem Relation Age of Onset  . Sickle cell anemia Father     Allergies  Allergen Reactions  . Ace Inhibitors     Cough    Current Outpatient Medications on File Prior to Visit  Medication Sig Dispense Refill  . amLODipine (NORVASC) 10 MG tablet TAKE 1 TABLET BY MOUTH ONCE DAILY 90 tablet 3  . atorvastatin (LIPITOR) 40 MG tablet TAKE 1 TABLET BY MOUTH ONCE DAILY 90 tablet 3  . blood glucose meter kit and supplies Check blood sugar 1-2 times a day (FOR ICD-E11.9). 1 each 0  . diphenhydramine-acetaminophen (TYLENOL PM) 25-500 MG TABS Take 2 tablets by mouth every 4 (four) hours as needed (pain).    . Dulaglutide (TRULICITY) 7.73 PV/6.6KD SOPN INJECT 0.75 MG UNDER THE SKIN ONCE A  WEEK 12 pen 1  . hydrochlorothiazide (MICROZIDE) 12.5 MG capsule TAKE 1 CAPSULE BY MOUTH  ONCE DAILY 90 capsule 3  . Lancets (ONETOUCH DELICA PLUS PTELMR61H) MISC USE TO TEST 1-2 TIMES DAILY 100 each 0  . losartan (COZAAR) 25 MG tablet TAKE 1 TABLET BY MOUTH ONCE DAILY 90 tablet 3  . metoprolol succinate (TOPROL-XL) 50 MG 24 hr tablet TAKE 1 TABLET BY MOUTH ONCE DAILY 90 tablet 3  . omeprazole (PRILOSEC) 20 MG capsule TAKE 1 CAPSULE BY MOUTH  ONCE DAILY 90 capsule 3  . ONETOUCH VERIO test strip Check blood sugar 1-2 times daily 100 each 12  . potassium chloride SA (K-DUR,KLOR-CON) 20 MEQ tablet Take 1 tablet (20 mEq total) by mouth daily. 5 tablet 0  . PRESCRIPTION MEDICATION Place 1 drop into both eyes daily. For dry eye and allergies.     No current facility-administered medications on file prior to visit.     BP 136/82   Pulse 65   Temp (!) 96.9 F (36.1 C) (Temporal)   Resp 16   Ht '5\' 4"'  (1.626 m)   Wt 181 lb 12.8 oz (82.5 kg)   SpO2 100%   BMI 31.21 kg/m       Objective:   Physical Exam  General Mental Status- Alert. General Appearance- Not in acute distress.   Chest and Lung Exam Auscultation: Breath Sounds:-Normal.  Cardiovascular Auscultation:Rythm- Regular. Murmurs & Other Heart Sounds:Auscultation of the heart reveals- No Murmurs.  Abdomen Inspection:-Inspeection Normal. Palpation/Percussion:Note:No mass. Palpation and Percussion of the abdomen reveal- Non Tender, Non Distended + BS, no rebound or guarding.    Neurologic Cranial Nerve exam:- CN III-XII intact(No nystagmus), symmetric smile. Strength:- 5/5 equal and symmetric strength both upper and lower extremities.      Assessment & Plan:  Your sugars/diabetes  will get a1c today. Make adjustments to regimen if needed.  For high cholesterol will get lipid panel fasting tomorrow along with cmp.  For chronic kidney disease follow CR and GFR.  For htn, continue current bp meds.  Follow up date  to be determined after lab review or as needed.   Screening mammogram order placed. Pt called and got scheduled while in office.  Auguste Tebbetts, PA-C   25 minutes spent with pt. 50% of time spent counseling pt on plan going forward.

## 2019-04-23 NOTE — Patient Instructions (Addendum)
Your sugars/diabetes  will get a1c today. Make adjustments to regimen if needed.  For high cholesterol will get lipid panel fasting tomorrow along with cmp.  For chronic kidney disease follow CR and GFR.  For htn, continue current bp meds.  Follow up date to be determined after lab review or as needed.  Screening mammogram order placed. Pt called and got scheduled while in office.

## 2019-04-28 ENCOUNTER — Telehealth: Payer: Self-pay | Admitting: Medical

## 2019-04-28 DIAGNOSIS — N1831 Chronic kidney disease, stage 3a: Secondary | ICD-10-CM

## 2019-04-28 NOTE — Telephone Encounter (Signed)
Referral to nephrologist placed. 

## 2019-04-30 ENCOUNTER — Encounter: Payer: Self-pay | Admitting: Medical

## 2019-06-23 ENCOUNTER — Encounter: Payer: Self-pay | Admitting: Family

## 2019-06-23 LAB — HM DIABETES EYE EXAM

## 2019-06-25 ENCOUNTER — Encounter: Payer: Self-pay | Admitting: Medical

## 2019-06-30 ENCOUNTER — Other Ambulatory Visit: Payer: Self-pay | Admitting: Medical

## 2019-06-30 ENCOUNTER — Telehealth: Payer: Self-pay | Admitting: Medical

## 2019-06-30 ENCOUNTER — Other Ambulatory Visit: Payer: Self-pay

## 2019-06-30 MED ORDER — METOPROLOL SUCCINATE ER 50 MG PO TB24: 50.0000 mg | ORAL_TABLET | Freq: Every day | ORAL | 1 refills | Status: DC

## 2019-06-30 MED ORDER — HYDROCHLOROTHIAZIDE 12.5 MG PO CAPS: 12.5000 mg | ORAL_CAPSULE | Freq: Every day | ORAL | 1 refills | Status: DC

## 2019-06-30 MED ORDER — ATORVASTATIN CALCIUM 40 MG PO TABS: 40.0000 mg | ORAL_TABLET | Freq: Every day | ORAL | 1 refills | Status: DC

## 2019-06-30 MED ORDER — LOSARTAN POTASSIUM 25 MG PO TABS: 25.0000 mg | ORAL_TABLET | Freq: Every day | ORAL | 1 refills | Status: DC

## 2019-06-30 MED ORDER — AMLODIPINE BESYLATE 10 MG PO TABS: 10.0000 mg | ORAL_TABLET | Freq: Every day | ORAL | 1 refills | Status: DC

## 2019-06-30 MED ORDER — TRULICITY 0.75 MG/0.5ML ~~LOC~~ SOAJ: SUBCUTANEOUS | 1 refills | Status: DC

## 2019-06-30 MED ORDER — OMEPRAZOLE 20 MG PO CPDR: 20.0000 mg | DELAYED_RELEASE_CAPSULE | Freq: Every day | ORAL | 1 refills | Status: DC

## 2019-06-30 NOTE — Telephone Encounter (Signed)
Copied from [place] [phone]. Topic: General - Other >> Jun 30, 2019  3:22 PM Leward Quan A wrote: Reason for CRM: Patient called asking to speak to [facility] nurse was informed that I will take a message as they are with patients and would not be able to come to the phone at the time of request. Patient became very angry proceeded to angrily say that she had been on the phone for three hours with [place] and needed refills on her medication. I called the office and per Mackie Pai take a message with names of medications needed went back and explain to the patient who then decided to start be littleing me the practice and everyone that works here. States that we are gonae cause her to have a heart attack. She used the F.. word several times before and after giving the information needed. Patient was very rude and disrespectful

## 2019-06-30 NOTE — Telephone Encounter (Signed)
Copied from [place] [phone]. Topic: Quick Communication - Rx Refill/Question >> Jun 30, 2019  3:15 PM Leward Quan A wrote: Medication: amLODipine (NORVASC) 10 MG tablet, atorvastatin (LIPITOR) 40 MG tablet, hydrochlorothiazide (MICROZIDE) 12.5 MG capsule, Dulaglutide (TRULICITY) 0.45 WU/9.8JX SOPN, metoprolol succinate (TOPROL-XL) 50 MG 24 hr tablet, omeprazole (PRILOSEC) 20 MG capsule, losartan (COZAAR) 25 MG tablet   Has the patient contacted their pharmacy? Yes.   (Agent: If no, request that the patient contact the pharmacy for the refill.) (Agent: If yes, when and what did the pharmacy advise?)  Preferred Pharmacy (with phone number or street name): [place], [place] [facility] [employer]  Phone:  [phone] Fax:  [phone]     Agent: Please be advised that RX refills may take up to 3 business days. We ask that you follow-up with your pharmacy.

## 2019-06-30 NOTE — Telephone Encounter (Signed)
FYI I did refill her medicine for her however she became very disrespectful to the [facility] per note from agent.

## 2019-06-30 NOTE — Telephone Encounter (Signed)
Called pt after hours when got a chance/finished seeing patients for the day. Called both cell and home phone. No answer.   Not sure what is issues with her prescriptions. Olivia Morales showed me earlier today that all her prescriptions were sent with refills.   Looks like meds resent again today.  Some mail order rx have been delayed and even lost.  Expect will be busy tomorrow. Can someone follow up with this and update me.  Hearing about her cursing staff surprising and disappointing.  Let me talk with you about this tomorrow.

## 2019-06-30 NOTE — Telephone Encounter (Signed)
Medications refilled

## 2019-07-01 NOTE — Telephone Encounter (Signed)
Spoke with [facility] and they stated that they have omeprazole, hctz, trulicity, atorvastatin, losartan, metoprolol, and amlodipine sent out today and patient should received in about 5-7 business days.  Left message on machine that meds have been shipped out and to let us know if she needs anything.

## 2019-07-01 NOTE — Telephone Encounter (Signed)
I called and spoke with patient and she got the run around yesterday with [facility] yesterday and was very upset and they told her that she needed her doctor to authorize her medications.  She apologized about yesterday, she was just upset with the mail order.  I advised her that her medications was sent yesterday, but I will call [facility] and follow up with them today and will call her after.  She was very [employer].

## 2019-07-02 ENCOUNTER — Telehealth: Payer: Self-pay | Admitting: Medical

## 2019-07-02 ENCOUNTER — Other Ambulatory Visit: Payer: Self-pay

## 2019-07-02 MED ORDER — AMLODIPINE BESYLATE 10 MG PO TABS: 10.0000 mg | ORAL_TABLET | Freq: Every day | ORAL | 1 refills | Status: DC

## 2019-07-02 MED ORDER — METOPROLOL SUCCINATE ER 50 MG PO TB24: 50.0000 mg | ORAL_TABLET | Freq: Every day | ORAL | 1 refills | Status: DC

## 2019-07-02 MED ORDER — LOSARTAN POTASSIUM 25 MG PO TABS: 25.0000 mg | ORAL_TABLET | Freq: Every day | ORAL | 1 refills | Status: DC

## 2019-07-02 MED ORDER — HYDROCHLOROTHIAZIDE 12.5 MG PO CAPS: 12.5000 mg | ORAL_CAPSULE | Freq: Every day | ORAL | 1 refills | Status: DC

## 2019-07-02 MED ORDER — ATORVASTATIN CALCIUM 40 MG PO TABS: 40.0000 mg | ORAL_TABLET | Freq: Every day | ORAL | 1 refills | Status: DC

## 2019-07-02 MED ORDER — OMEPRAZOLE 20 MG PO CPDR: 20.0000 mg | DELAYED_RELEASE_CAPSULE | Freq: Every day | ORAL | 1 refills | Status: DC

## 2019-07-02 MED ORDER — TRULICITY 0.75 MG/0.5ML ~~LOC~~ SOAJ: SUBCUTANEOUS | 1 refills | Status: DC

## 2019-07-02 NOTE — Telephone Encounter (Signed)
Pt came in office stating is needing rx printed out for her refills, since pt mentioned that she has [facility] and that she can not get her meds sooner ([facility] told pt not til March) and pt is out of her meds (Amlodipine, Atorvastatin [place], Dulaglutide (Solution Pen-Injection), hydrochlorothiazide, Losartan Potassium, Metroprolol Succinate and Omeprazole. Please advise ASAP.

## 2019-07-06 NOTE — Telephone Encounter (Signed)
Pt given printed out Rx last week.

## 2019-07-08 MED FILL — LOSARTAN POTASSIUM 25 MG TA: 25 | 7 days supply | Qty: 7 | Fill #0

## 2019-07-08 MED FILL — OMEPRAZOLE 20 MG CAP: 20 | 7 days supply | Qty: 7 | Fill #0

## 2019-07-08 MED FILL — HYDROCHLOROTHIAZIDE 12.5 MG: 12.5 | 7 days supply | Qty: 7 | Fill #0

## 2019-07-08 MED FILL — METOPROLOL SUCCINATE ER 50: 50 | 7 days supply | Qty: 7 | Fill #0

## 2019-07-08 MED FILL — AMLODIPINE BESYLATE 10 MG T: 10 | 7 days supply | Qty: 7 | Fill #0

## 2019-07-08 MED FILL — ATORVASTATIN 40 MG TABLET: 40 | 7 days supply | Qty: 7 | Fill #0

## 2019-07-16 ENCOUNTER — Telehealth: Payer: Self-pay | Admitting: *Deleted

## 2019-07-16 NOTE — Telephone Encounter (Signed)
Copied from CRM [id number]. Topic: General - Other >> Jul 16, 2019  1:45 PM Gwenlyn Fudge wrote: Reason for CRM: Pt called and is requesting to have a tb screening. Please advise.

## 2019-07-20 NOTE — Telephone Encounter (Signed)
scheduled

## 2019-07-21 ENCOUNTER — Ambulatory Visit: Payer: Medicare Other

## 2019-07-24 ENCOUNTER — Other Ambulatory Visit: Payer: Self-pay

## 2019-07-24 ENCOUNTER — Ambulatory Visit (INDEPENDENT_AMBULATORY_CARE_PROVIDER_SITE_OTHER): Payer: Medicare Other

## 2019-07-24 DIAGNOSIS — Z111 Encounter for screening for respiratory tuberculosis: Secondary | ICD-10-CM | POA: Diagnosis not present

## 2019-07-24 NOTE — Progress Notes (Signed)
Patient here today for tb skin test. 0.1 mL ppd given in left lower forearm. Wheel present. Patient coming back Monday to have tb skin test read.

## 2019-07-27 ENCOUNTER — Encounter: Payer: Self-pay | Admitting: *Deleted

## 2019-07-27 LAB — TB SKIN TEST
Induration: 0 mm
TB Skin Test: NEGATIVE

## 2019-08-19 ENCOUNTER — Telehealth: Payer: Self-pay | Admitting: Medical

## 2019-08-19 NOTE — Telephone Encounter (Signed)
I see that pt is on trulicity weekly. Was she supposed to take it today? If sugar is not real high she could take it little late.

## 2019-08-19 NOTE — Telephone Encounter (Signed)
Patient notified and she will take tomorrow and watch her sugar.

## 2019-08-19 NOTE — Telephone Encounter (Signed)
Please advise patient on weather or not she will be ok to skip insulin until tomorrow.   Patient left insulin at home and she in  [place] at work and wont return until the morning.

## 2019-08-28 ENCOUNTER — Other Ambulatory Visit: Payer: Self-pay

## 2019-08-28 ENCOUNTER — Ambulatory Visit (INDEPENDENT_AMBULATORY_CARE_PROVIDER_SITE_OTHER): Payer: Medicare Other

## 2019-08-28 DIAGNOSIS — Z111 Encounter for screening for respiratory tuberculosis: Secondary | ICD-10-CM | POA: Diagnosis not present

## 2019-08-28 NOTE — Progress Notes (Signed)
Patient seen for TB Test Left forearm , wheel . Patient tolerated injection well.

## 2019-09-22 ENCOUNTER — Ambulatory Visit (INDEPENDENT_AMBULATORY_CARE_PROVIDER_SITE_OTHER): Payer: Medicare Other

## 2019-09-22 ENCOUNTER — Other Ambulatory Visit: Payer: Self-pay

## 2019-09-22 DIAGNOSIS — Z111 Encounter for screening for respiratory tuberculosis: Secondary | ICD-10-CM | POA: Diagnosis not present

## 2019-09-22 NOTE — Progress Notes (Addendum)
Patient here today for TB skin test per employer and [employer]. 0.71mL given in left forearm ID. Patient tolerated well. She is going to have her nurse at her office read the test on Thursday.   Agree with administration of ppd.  Esperanza Richters, PA-C

## 2019-09-25 ENCOUNTER — Emergency Department (HOSPITAL_BASED_OUTPATIENT_CLINIC_OR_DEPARTMENT_OTHER): Payer: Medicare Other

## 2019-09-25 ENCOUNTER — Telehealth: Payer: Self-pay | Admitting: *Deleted

## 2019-09-25 ENCOUNTER — Emergency Department (HOSPITAL_BASED_OUTPATIENT_CLINIC_OR_DEPARTMENT_OTHER)
Admission: EM | Admit: 2019-09-25 | Discharge: 2019-09-25 | Disposition: A | Discharge status: Home / Self Care | Payer: Medicare Other | Attending: Emergency Medicine | Admitting: Emergency Medicine

## 2019-09-25 ENCOUNTER — Other Ambulatory Visit: Payer: Self-pay

## 2019-09-25 ENCOUNTER — Encounter (HOSPITAL_BASED_OUTPATIENT_CLINIC_OR_DEPARTMENT_OTHER): Payer: Self-pay

## 2019-09-25 DIAGNOSIS — Z7984 Long term (current) use of oral hypoglycemic drugs: Secondary | ICD-10-CM | POA: Diagnosis not present

## 2019-09-25 DIAGNOSIS — R05 Cough: Secondary | ICD-10-CM | POA: Diagnosis not present

## 2019-09-25 DIAGNOSIS — I129 Hypertensive chronic kidney disease with stage 1 through stage 4 chronic kidney disease, or unspecified chronic kidney disease: Secondary | ICD-10-CM | POA: Diagnosis not present

## 2019-09-25 DIAGNOSIS — E1122 Type 2 diabetes mellitus with diabetic chronic kidney disease: Secondary | ICD-10-CM | POA: Insufficient documentation

## 2019-09-25 DIAGNOSIS — M7918 Myalgia, other site: Secondary | ICD-10-CM | POA: Insufficient documentation

## 2019-09-25 DIAGNOSIS — Z79899 Other long term (current) drug therapy: Secondary | ICD-10-CM | POA: Insufficient documentation

## 2019-09-25 DIAGNOSIS — N183 Chronic kidney disease, stage 3 unspecified: Secondary | ICD-10-CM | POA: Insufficient documentation

## 2019-09-25 DIAGNOSIS — U071 COVID-19: Secondary | ICD-10-CM | POA: Insufficient documentation

## 2019-09-25 DIAGNOSIS — J069 Acute upper respiratory infection, unspecified: Secondary | ICD-10-CM

## 2019-09-25 DIAGNOSIS — R1110 Vomiting, unspecified: Secondary | ICD-10-CM | POA: Diagnosis present

## 2019-09-25 LAB — CBC WITH DIFFERENTIAL/PLATELET
Abs Immature Granulocytes: 0 10*3/uL (ref 0.00–0.07)
Basophils Absolute: 0 10*3/uL (ref 0.0–0.1)
Basophils Relative: 0 %
Eosinophils Absolute: 0 10*3/uL (ref 0.0–0.5)
Eosinophils Relative: 0 %
HCT: 30.3 % — ABNORMAL LOW (ref 36.0–46.0)
Hemoglobin: 10 g/dL — ABNORMAL LOW (ref 12.0–15.0)
Immature Granulocytes: 0 %
Lymphocytes Relative: 25 %
Lymphs Abs: 0.9 10*3/uL (ref 0.7–4.0)
MCH: 27.4 pg (ref 26.0–34.0)
MCHC: 33 g/dL (ref 30.0–36.0)
MCV: 83 fL (ref 80.0–100.0)
Monocytes Absolute: 0.3 10*3/uL (ref 0.1–1.0)
Monocytes Relative: 8 %
Neutro Abs: 2.5 10*3/uL (ref 1.7–7.7)
Neutrophils Relative %: 67 %
Platelets: 256 10*3/uL (ref 150–400)
RBC: 3.65 MIL/uL — ABNORMAL LOW (ref 3.87–5.11)
RDW: 12.7 % (ref 11.5–15.5)
WBC: 3.7 10*3/uL — ABNORMAL LOW (ref 4.0–10.5)
nRBC: 0 % (ref 0.0–0.2)

## 2019-09-25 LAB — COMPREHENSIVE METABOLIC PANEL
ALT: 13 U/L (ref 0–44)
AST: 19 U/L (ref 15–41)
Albumin: 4 g/dL (ref 3.5–5.0)
Alkaline Phosphatase: 81 U/L (ref 38–126)
Anion gap: 12 (ref 5–15)
BUN: 24 mg/dL — ABNORMAL HIGH (ref 8–23)
CO2: 24 mmol/L (ref 22–32)
Calcium: 9.4 mg/dL (ref 8.9–10.3)
Chloride: 101 mmol/L (ref 98–111)
Creatinine, Ser: 1.69 mg/dL — ABNORMAL HIGH (ref 0.44–1.00)
GFR calc Af Amer: 36 mL/min — ABNORMAL LOW (ref >60)
GFR calc non Af Amer: 31 mL/min — ABNORMAL LOW (ref >60)
Glucose, Bld: 90 mg/dL (ref 70–99)
Potassium: 3.3 mmol/L — ABNORMAL LOW (ref 3.5–5.1)
Sodium: 137 mmol/L (ref 135–145)
Total Bilirubin: 1.2 mg/dL (ref 0.3–1.2)
Total Protein: 7.7 g/dL (ref 6.5–8.1)

## 2019-09-25 LAB — URINALYSIS, ROUTINE W REFLEX MICROSCOPIC
Bilirubin Urine: NEGATIVE
Glucose, UA: NEGATIVE mg/dL
Hgb urine dipstick: NEGATIVE
Ketones, ur: NEGATIVE mg/dL
Leukocytes,Ua: NEGATIVE
Nitrite: NEGATIVE
Protein, ur: NEGATIVE mg/dL
Specific Gravity, Urine: 1.015 (ref 1.005–1.030)
pH: 6 (ref 5.0–8.0)

## 2019-09-25 LAB — SARS CORONAVIRUS 2 (TAT 6-24 HRS): SARS Coronavirus 2: POSITIVE — AB

## 2019-09-25 MED ORDER — DM-GUAIFENESIN ER 30-600 MG PO TB12: 1.0000 | ORAL_TABLET | Freq: Two times a day (BID) | ORAL | 1 refills | Status: DC

## 2019-09-25 MED ORDER — SODIUM CHLORIDE 0.9 % IV SOLN
1000.0000 mL | INTRAVENOUS | Status: DC
  Administered 2019-09-25: 1000 mL via INTRAVENOUS

## 2019-09-25 MED ORDER — SODIUM CHLORIDE 0.9 % IV BOLUS (SEPSIS)
500.0000 mL | Freq: Once | INTRAVENOUS | Status: AC
  Administered 2019-09-25: 15:00:00 500 mL via INTRAVENOUS

## 2019-09-25 NOTE — ED Provider Notes (Signed)
[facility] EMERGENCY DEPARTMENT Provider Note   CSN: [id number] Arrival date & time: 09/25/19  1150     History Chief Complaint  Patient presents with  . Vomiting    Olivia Morales is a 69 y.o. female.  HPI   Pt started having some cough and congestion several days ago.  She is having some lightheadedness with cough.  She is having diffuse body aches.  The pain is throughout her body.  She has had decreased appetite.  No known covid exposure.   She called her doctor's office today and was told to come to the ED.  Past Medical History:  Diagnosis Date  . Diabetes mellitus   . Hypercholesteremia   . Hypertension   . Sleep apnea     Patient Active Problem List   Diagnosis Date Noted  . Left ankle injury, subsequent encounter 12/19/2017  . Class 2 severe obesity due to excess calories with serious comorbidity and body mass index (BMI) of 35.0 to 35.9 in adult ([place]) 03/05/2017  . 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 Nocturia more than twice per night 02/08/2016  . Noncompliance with CPAP treatment 02/08/2016  . CKD (chronic kidney disease), stage III 01/11/2014  . Hyperlipidemia associated with type 2 diabetes mellitus ([place]) 09/14/2013  . Hypertension associated with diabetes ([place]) 10/31/2011  . Diabetes mellitus ([place]) 10/31/2011    Past Surgical History:  Procedure Laterality Date  . TUBAL LIGATION       OB History   No obstetric history on file.     Family History  Problem Relation Age of Onset  . Sickle cell anemia Father     Social History   Tobacco Use  . Smoking status: Never Smoker  . Smokeless tobacco: Never Used  Substance Use Topics  . Alcohol use: Yes    Comment: weekly  . Drug use: No    Home Medications Prior to Admission medications   Medication Sig Start Date End Date Taking? Authorizing Provider  amLODipine (NORVASC) 10 MG tablet Take 1 tablet (10 mg total) by mouth daily. 07/02/19   Saguier, Percell Miller, PA-C   atorvastatin (LIPITOR) 40 MG tablet Take 1 tablet (40 mg total) by mouth daily. 07/02/19   Saguier, Percell Miller, PA-C  blood glucose meter kit and supplies Check blood sugar 1-2 times a day (FOR ICD-E11.9). 03/26/18   Saguier, Percell Miller, PA-C  diphenhydramine-acetaminophen (TYLENOL PM) 25-500 MG TABS Take 2 tablets by mouth every 4 (four) hours as needed (pain).    [provider]  Dulaglutide (TRULICITY) 4.09 WJ/1.9JY SOPN INJECT 0.75 MG UNDER THE SKIN ONCE A WEEK 07/02/19   Saguier, Percell Miller, PA-C  hydrochlorothiazide (MICROZIDE) 12.5 MG capsule Take 1 capsule (12.5 mg total) by mouth daily. 07/02/19   Saguier, Percell Miller, PA-C  Lancets [facility] DELICA PLUS [id number]) MISC USE TO TEST 1-2 TIMES DAILY 07/28/18   Saguier, Percell Miller, PA-C  losartan (COZAAR) 25 MG tablet Take 1 tablet (25 mg total) by mouth daily. 07/02/19   Saguier, Percell Miller, PA-C  metoprolol succinate (TOPROL-XL) 50 MG 24 hr tablet Take 1 tablet (50 mg total) by mouth daily. Take with or immediately following a meal. 07/02/19   Saguier, Percell Miller, PA-C  omeprazole (PRILOSEC) 20 MG capsule Take 1 capsule (20 mg total) by mouth daily. 07/02/19   Saguier, Percell Miller, PA-C  [facility] VERIO test strip Check blood sugar 1-2 times daily 05/28/18   Saguier, Percell Miller, PA-C  potassium chloride SA (K-DUR,KLOR-CON) 20 MEQ tablet Take 1 tablet (20 mEq total) by mouth daily. 09/11/18  Saguier, Percell Miller, PA-C  PRESCRIPTION MEDICATION Place 1 drop into both eyes daily. For dry eye and allergies.    [provider]    Allergies    Ace inhibitors  Review of Systems   Review of Systems  All other systems reviewed and are negative.   Physical Exam Updated Vital Signs BP (!) 153/70 (BP Location: Left Arm)   Pulse 68   Temp 99.1 F (37.3 C) (Oral)   Resp 18   Ht 1.626 m ('5\' 4"' )   Wt 77.6 kg   SpO2 98%   BMI 29.35 kg/m   Physical Exam Vitals and nursing note reviewed.  Constitutional:      General: She is not in acute distress.    Appearance: She  is well-developed.  HENT:     Head: Normocephalic and atraumatic.     Right Ear: External ear normal.     Left Ear: External ear normal.  Eyes:     General: No scleral icterus.       Right eye: No discharge.        Left eye: No discharge.     Conjunctiva/sclera: Conjunctivae normal.  Neck:     Trachea: No tracheal deviation.  Cardiovascular:     Rate and Rhythm: Normal rate and regular rhythm.  Pulmonary:     Effort: Pulmonary effort is normal. No respiratory distress.     Breath sounds: Normal breath sounds. No stridor. No wheezing or rales.  Abdominal:     General: Bowel sounds are normal. There is no distension.     Palpations: Abdomen is soft.     Tenderness: There is no abdominal tenderness. There is no guarding or rebound.  Musculoskeletal:        General: No tenderness.     Cervical back: Neck supple.  Skin:    General: Skin is warm and dry.     Findings: No rash.  Neurological:     Mental Status: She is alert.     Cranial Nerves: No cranial nerve deficit (no facial droop, extraocular movements intact, no slurred speech).     Sensory: No sensory deficit.     Motor: No abnormal muscle tone or seizure activity.     Coordination: Coordination normal.     ED Results / Procedures / Treatments   Labs (all labs ordered are listed, but only abnormal results are displayed) Labs Reviewed  CBC WITH DIFFERENTIAL/PLATELET - Abnormal; Notable for the following components:      Result Value   WBC 3.7 (*)    RBC 3.65 (*)    Hemoglobin 10.0 (*)    HCT 30.3 (*)    All other components within normal limits  COMPREHENSIVE METABOLIC PANEL - Abnormal; Notable for the following components:   Potassium 3.3 (*)    BUN 24 (*)    Creatinine, Ser 1.69 (*)    GFR calc non Af Amer 31 (*)    GFR calc Af Amer 36 (*)    All other components within normal limits  SARS CORONAVIRUS 2 (TAT 6-24 HRS)  URINALYSIS, ROUTINE W REFLEX MICROSCOPIC    EKG None  Radiology DG Chest Portable 1  View  Result Date: 09/25/2019 CLINICAL DATA:  Cough. Nausea and vomiting. EXAM: PORTABLE CHEST 1 VIEW COMPARISON:  03/02/2007 FINDINGS: The cardiomediastinal silhouette is unchanged with upper limits of normal heart size. Mild interstitial thickening is similar to the prior study. No confluent airspace opacity, overt pulmonary edema, sizable pleural effusion, pneumothorax is identified. Aortic atherosclerosis  is noted. No acute osseous abnormality is seen. IMPRESSION: Unchanged appearance of the chest without evidence of airspace consolidation or edema. Electronically Signed   By: Logan Bores M.D.   On: 09/25/2019 14:19    Procedures Procedures (including critical care time)  Medications Ordered in ED Medications  sodium chloride 0.9 % bolus 500 mL (500 mLs Intravenous New Bag/Given 09/25/19 1430)    Followed by  0.9 %  sodium chloride infusion (1,000 mLs Intravenous New Bag/Given 09/25/19 1433)    ED Course  I have reviewed the triage vital signs and the nursing notes.  Pertinent labs & imaging results that were available during my care of the patient were reviewed by me and considered in my medical decision making (see chart for details).  Clinical Course as of Sep 24 1528  Fri Sep 25, 2019  1528 Chest x-ray without acute findings.   [PF]  2924 Laboratory tests without significant abnormalities.  Anemia stable.  Renal sufficiency is stable.   [JK]  1529 Urinalysis pending.   [JK]    Clinical Course User Index [JK] Dorie Rank, MD   MDM Rules/Calculators/A&P                      Patient presented to the ED with complaints of generalized myalgias and URI symptoms.  ED exam is reassuring.  Abdomen is benign.  I doubt appendicitis colitis diverticulitis.  Chest x-ray does not show pneumonia.  Urinalysis pending.  Covid test is also been ordered.  Plan on discharge once urinalysis results..  Care turned over to Dr Rogene Houston Final Clinical Impression(s) / ED Diagnoses Final diagnoses:   Upper respiratory tract infection, unspecified type      Dorie Rank, MD 09/25/19 1530

## 2019-09-25 NOTE — ED Notes (Signed)
Gave pt. Some fluids and food.

## 2019-09-25 NOTE — Telephone Encounter (Signed)
Can you please try and call later?

## 2019-09-25 NOTE — ED Provider Notes (Signed)
Covid testing still pending unfortunately.  Urinalysis was negative.  Will discharge patient home with trial of Mucinex DM Coricidin was not working too well for her.  She will self isolate until she has her results.  She knows she can use MyChart has used that before.   Vanetta Mulders, MD 09/25/19 [id number]

## 2019-09-25 NOTE — Discharge Instructions (Addendum)
Covid testing pending.  So not completely ruled out.  Need to self isolate in the meantime.  Chest x-ray negative urinalysis negative.  Labs without significant abnormalities.

## 2019-09-25 NOTE — Telephone Encounter (Signed)
Called her as well no answer.

## 2019-09-25 NOTE — ED Notes (Signed)
Pt amb to BR to obtain urine sample

## 2019-09-25 NOTE — ED Triage Notes (Signed)
Pt c/o n/v x 2 days-pain to abd, back and feet and prod cough-NAD-steady gait

## 2019-09-25 NOTE — Telephone Encounter (Signed)
Who Is Calling Patient / Member / Family / Caregiver Call Type Triage / Clinical Relationship To Patient Self Return Phone Number [phone] (Primary) Chief Complaint Cough Reason for Call Symptomatic / Request for Health Information Initial Comment Caller is calling stating he is unable to keep any food down and has discharge in her throat and dizzy with chills. Caller states she was taking medication for high blood pressure. Caller has not been able to urinate in the past 8 hours. GOTO Facility Not Listed Closest ER Translation No Nurse Assessment Nurse: Charna Elizabeth, RN, Cathy Date/Time (Eastern Time): 09/24/2019 1:48:59 PM Confirm and document reason for call. If symptomatic, describe symptoms. ---Olivia Morales states that she developed a cough about a week ago, has vomited three times this morning, has muscle aches, chills and feel dizzy. No severe breathing difficulty. No chest pain. Alert and responsive. 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 Time Lamount Cohen Time) Disposition Final User 09/24/2019 1:45:20 PM Send to Urgent Queue Von Der Geryl Councilman 09/24/2019 1:54:08 PM Go to ED Now (or PCP triage) Yes Charna Elizabeth, RN, Lynden Ang

## 2019-09-25 NOTE — ED Notes (Signed)
Pt ambulated to RR unassisted 

## 2019-09-25 NOTE — Telephone Encounter (Signed)
No answer/ vm full.   Called to check status on patient.  I do not see where she did go to the hospital in [facility].  I know sometimes [place] another [place].

## 2019-09-26 ENCOUNTER — Other Ambulatory Visit: Payer: Self-pay | Admitting: Physician Assistant

## 2019-09-26 ENCOUNTER — Telehealth: Payer: Self-pay | Admitting: Physician Assistant

## 2019-09-26 DIAGNOSIS — U071 COVID-19: Secondary | ICD-10-CM

## 2019-09-26 DIAGNOSIS — Z794 Long term (current) use of insulin: Secondary | ICD-10-CM

## 2019-09-26 DIAGNOSIS — E1122 Type 2 diabetes mellitus with diabetic chronic kidney disease: Secondary | ICD-10-CM

## 2019-09-26 DIAGNOSIS — I152 Hypertension secondary to endocrine disorders: Secondary | ICD-10-CM

## 2019-09-26 DIAGNOSIS — E1159 Type 2 diabetes mellitus with other circulatory complications: Secondary | ICD-10-CM

## 2019-09-26 MED ORDER — SODIUM CHLORIDE 0.9 % IV SOLN
700.0000 mg | Freq: Once | INTRAVENOUS | Status: AC
  Administered 2019-09-27: 10:00:00 700 mg via INTRAVENOUS
  Filled 2019-09-26: qty 700

## 2019-09-26 NOTE — Telephone Encounter (Signed)
Thanks for update

## 2019-09-26 NOTE — Telephone Encounter (Signed)
Called to discuss with patient about Covid symptoms and the use of bamlanivimab or casirivimab/imdevimab, a monoclonal antibody infusion for those with mild to moderate Covid symptoms and at a high risk of hospitalization.  Pt is qualified for this infusion at the [facility] infusion center due to Age > 65 , HTN and DMT2  Message left to call back as well as a Wellsite geologist.  Cline Crock PA-C  [facility]

## 2019-09-26 NOTE — Progress Notes (Signed)
  I connected by phone with Olivia Morales on 09/26/2019 at 9:15 AM to discuss the potential use of an new treatment for mild to moderate COVID-19 viral infection in non-hospitalized patients.  This patient is a 69 y.o. female that meets the FDA criteria for Emergency Use Authorization of bamlanivimab or casirivimab\imdevimab.  Has a (+) direct SARS-CoV-2 viral test result  Has mild or moderate COVID-19   Is ? 67 years of age and weighs ? 40 kg  Is NOT hospitalized due to COVID-19  Is NOT requiring oxygen therapy or requiring an increase in baseline oxygen flow rate due to COVID-19  Is within 10 days of symptom onset  Has at least one of the high risk factor(s) for progression to severe COVID-19 and/or hospitalization as defined in EUA.  Specific high risk criteria : >/= 67 yo, DMT2, HTN   I have spoken and communicated the following to the patient or parent/caregiver:  1. FDA has authorized the emergency use of bamlanivimab and casirivimab\imdevimab for the treatment of mild to moderate COVID-19 in adults and pediatric patients with positive results of direct SARS-CoV-2 viral testing who are 33 years of age and older weighing at least 40 kg, and who are at high risk for progressing to severe COVID-19 and/or hospitalization.  2. The significant known and potential risks and benefits of bamlanivimab and casirivimab\imdevimab, and the extent to which such potential risks and benefits are unknown.  3. Information on available alternative treatments and the risks and benefits of those alternatives, including clinical trials.  4. Patients treated with bamlanivimab and casirivimab\imdevimab should continue to self-isolate and use infection control measures (e.g., wear mask, isolate, social distance, avoid sharing personal items, clean and disinfect "high touch" surfaces, and frequent handwashing) according to CDC guidelines.   5. The patient or parent/caregiver has the option to accept  or refuse bamlanivimab or casirivimab\imdevimab .  After reviewing this information with the patient, The patient agreed to proceed with receiving the bamlanimivab infusion and will be provided a copy of the Fact sheet prior to receiving the infusion.   Sx onset 09/19/19. Set up for infusion 09/27/19 @ 10:30am. Orders in and directions given   Cline Crock 09/26/2019 9:15 AM

## 2019-09-27 ENCOUNTER — Ambulatory Visit (HOSPITAL_COMMUNITY)
Admission: RE | Admit: 2019-09-27 | Discharge: 2019-09-27 | Disposition: A | Discharge status: Home / Self Care | Payer: Medicare Other | Source: Ambulatory Visit | Attending: Pulmonary Disease | Admitting: Pulmonary Disease

## 2019-09-27 DIAGNOSIS — E1159 Type 2 diabetes mellitus with other circulatory complications: Secondary | ICD-10-CM | POA: Insufficient documentation

## 2019-09-27 DIAGNOSIS — I10 Essential (primary) hypertension: Secondary | ICD-10-CM | POA: Insufficient documentation

## 2019-09-27 DIAGNOSIS — Z23 Encounter for immunization: Secondary | ICD-10-CM | POA: Diagnosis not present

## 2019-09-27 DIAGNOSIS — U071 COVID-19: Secondary | ICD-10-CM | POA: Insufficient documentation

## 2019-09-27 DIAGNOSIS — Z794 Long term (current) use of insulin: Secondary | ICD-10-CM | POA: Diagnosis present

## 2019-09-27 DIAGNOSIS — E1122 Type 2 diabetes mellitus with diabetic chronic kidney disease: Secondary | ICD-10-CM | POA: Diagnosis present

## 2019-09-27 MED ORDER — DIPHENHYDRAMINE HCL 50 MG/ML IJ SOLN: 50.0000 mg | Freq: Once | INTRAMUSCULAR | Status: DC | PRN

## 2019-09-27 MED ORDER — FAMOTIDINE IN NACL 20-0.9 MG/50ML-% IV SOLN: 20.0000 mg | Freq: Once | INTRAVENOUS | Status: DC | PRN

## 2019-09-27 MED ORDER — SODIUM CHLORIDE 0.9 % IV SOLN: INTRAVENOUS | Status: DC | PRN

## 2019-09-27 MED ORDER — METHYLPREDNISOLONE SODIUM SUCC 125 MG IJ SOLR: 125.0000 mg | Freq: Once | INTRAMUSCULAR | Status: DC | PRN

## 2019-09-27 MED ORDER — EPINEPHRINE 0.3 MG/0.3ML IJ SOAJ: 0.3000 mg | Freq: Once | INTRAMUSCULAR | Status: DC | PRN

## 2019-09-27 MED ORDER — ALBUTEROL SULFATE HFA 108 (90 BASE) MCG/ACT IN AERS: 2.0000 | INHALATION_SPRAY | Freq: Once | RESPIRATORY_TRACT | Status: DC | PRN

## 2019-09-27 NOTE — Progress Notes (Signed)
  Diagnosis: COVID-19  Physician: Dr. Wright  Procedure: Covid Infusion Clinic Med: bamlanivimab infusion - Provided patient with bamlanimivab fact sheet for patients, parents and caregivers prior to infusion.  Complications: No immediate complications noted.  Discharge: Discharged home   Olivia Morales 09/27/2019  

## 2019-09-27 NOTE — Discharge Instructions (Signed)

## 2019-10-28 ENCOUNTER — Ambulatory Visit: Payer: Medicare Other | Attending: Internal Medicine

## 2019-10-28 DIAGNOSIS — Z20822 Contact with and (suspected) exposure to covid-19: Secondary | ICD-10-CM

## 2019-10-29 LAB — NOVEL CORONAVIRUS, NAA: SARS-CoV-2, NAA: NOT DETECTED

## 2019-10-29 LAB — SARS-COV-2, NAA 2 DAY TAT

## 2019-12-31 ENCOUNTER — Ambulatory Visit: Payer: Medicare Other | Admitting: Family Medicine

## 2019-12-31 ENCOUNTER — Emergency Department (HOSPITAL_BASED_OUTPATIENT_CLINIC_OR_DEPARTMENT_OTHER)
Admission: EM | Admit: 2019-12-31 | Discharge: 2019-12-31 | Disposition: A | Discharge status: Home / Self Care | Payer: Medicare Other | Attending: Emergency Medicine | Admitting: Emergency Medicine

## 2019-12-31 ENCOUNTER — Other Ambulatory Visit: Payer: Self-pay

## 2019-12-31 ENCOUNTER — Telehealth (INDEPENDENT_AMBULATORY_CARE_PROVIDER_SITE_OTHER): Payer: Medicare Other | Admitting: Family Medicine

## 2019-12-31 ENCOUNTER — Encounter (HOSPITAL_BASED_OUTPATIENT_CLINIC_OR_DEPARTMENT_OTHER): Payer: Self-pay

## 2019-12-31 ENCOUNTER — Encounter: Payer: Self-pay | Admitting: Family Medicine

## 2019-12-31 DIAGNOSIS — R5083 Postvaccination fever: Secondary | ICD-10-CM | POA: Diagnosis not present

## 2019-12-31 DIAGNOSIS — R197 Diarrhea, unspecified: Secondary | ICD-10-CM | POA: Insufficient documentation

## 2019-12-31 DIAGNOSIS — N183 Chronic kidney disease, stage 3 unspecified: Secondary | ICD-10-CM | POA: Diagnosis not present

## 2019-12-31 DIAGNOSIS — E1122 Type 2 diabetes mellitus with diabetic chronic kidney disease: Secondary | ICD-10-CM | POA: Diagnosis not present

## 2019-12-31 DIAGNOSIS — I129 Hypertensive chronic kidney disease with stage 1 through stage 4 chronic kidney disease, or unspecified chronic kidney disease: Secondary | ICD-10-CM | POA: Insufficient documentation

## 2019-12-31 LAB — COMPREHENSIVE METABOLIC PANEL
ALT: 19 U/L (ref 0–44)
AST: 22 U/L (ref 15–41)
Albumin: 4 g/dL (ref 3.5–5.0)
Alkaline Phosphatase: 85 U/L (ref 38–126)
Anion gap: 10 (ref 5–15)
BUN: 18 mg/dL (ref 8–23)
CO2: 25 mmol/L (ref 22–32)
Calcium: 9.6 mg/dL (ref 8.9–10.3)
Chloride: 98 mmol/L (ref 98–111)
Creatinine, Ser: 1.61 mg/dL — ABNORMAL HIGH (ref 0.44–1.00)
GFR calc Af Amer: 38 mL/min — ABNORMAL LOW (ref >60)
GFR calc non Af Amer: 33 mL/min — ABNORMAL LOW (ref >60)
Glucose, Bld: 106 mg/dL — ABNORMAL HIGH (ref 70–99)
Potassium: 3.4 mmol/L — ABNORMAL LOW (ref 3.5–5.1)
Sodium: 133 mmol/L — ABNORMAL LOW (ref 135–145)
Total Bilirubin: 1.1 mg/dL (ref 0.3–1.2)
Total Protein: 7.7 g/dL (ref 6.5–8.1)

## 2019-12-31 LAB — CBC
HCT: 30.7 % — ABNORMAL LOW (ref 36.0–46.0)
Hemoglobin: 10.4 g/dL — ABNORMAL LOW (ref 12.0–15.0)
MCH: 28 pg (ref 26.0–34.0)
MCHC: 33.9 g/dL (ref 30.0–36.0)
MCV: 82.7 fL (ref 80.0–100.0)
Platelets: 248 10*3/uL (ref 150–400)
RBC: 3.71 MIL/uL — ABNORMAL LOW (ref 3.87–5.11)
RDW: 13.3 % (ref 11.5–15.5)
WBC: 5.7 10*3/uL (ref 4.0–10.5)
nRBC: 0 % (ref 0.0–0.2)

## 2019-12-31 LAB — URINALYSIS, ROUTINE W REFLEX MICROSCOPIC
Bilirubin Urine: NEGATIVE
Glucose, UA: NEGATIVE mg/dL
Hgb urine dipstick: NEGATIVE
Ketones, ur: NEGATIVE mg/dL
Leukocytes,Ua: NEGATIVE
Nitrite: NEGATIVE
Protein, ur: NEGATIVE mg/dL
Specific Gravity, Urine: 1.01 (ref 1.005–1.030)
pH: 7 (ref 5.0–8.0)

## 2019-12-31 LAB — LIPASE, BLOOD: Lipase: 25 U/L (ref 11–51)

## 2019-12-31 MED ORDER — SODIUM CHLORIDE 0.9% FLUSH
3.0000 mL | Freq: Once | INTRAVENOUS | Status: DC
  Filled 2019-12-31: qty 3

## 2019-12-31 MED ORDER — ONDANSETRON HCL 4 MG/2ML IJ SOLN
4.0000 mg | Freq: Once | INTRAMUSCULAR | Status: AC
  Administered 2019-12-31: 4 mg via INTRAVENOUS
  Filled 2019-12-31: qty 2

## 2019-12-31 MED ORDER — ACETAMINOPHEN 325 MG PO TABS
650.0000 mg | ORAL_TABLET | Freq: Once | ORAL | Status: AC
  Administered 2019-12-31: 650 mg via ORAL
  Filled 2019-12-31: qty 2

## 2019-12-31 MED ORDER — SODIUM CHLORIDE 0.9 % IV BOLUS
1000.0000 mL | Freq: Once | INTRAVENOUS | Status: AC
  Administered 2019-12-31: 1000 mL via INTRAVENOUS

## 2019-12-31 NOTE — ED Triage Notes (Signed)
Pt c/o diarrhea, nausea, decreased appetite, weakness-sx started 6/29 after receiving first covid vaccine-NAD-to triage in w/c

## 2019-12-31 NOTE — Discharge Instructions (Addendum)
Your laboratory results are within normal limits today.  Please continue to hydrate with plenty of fluids such as Gatorade, water.  You may also continue taking Tylenol to help with your fever.  Your symptoms to resolve within the next 24 hours.  If you experience any your stool, worsening symptoms, abdominal pain please return to the emergency room.

## 2019-12-31 NOTE — ED Provider Notes (Signed)
[facility] EMERGENCY DEPARTMENT Provider Note   CSN: [id number] Arrival date & time: 12/31/19  1241     History Chief Complaint  Patient presents with  . Diarrhea    Olivia Morales is a 69 y.o. female.  69 y/o female with a PMH of DM, HTN presents to the ED with a chief complaint of diarrhea x2 days.  Reports receiving her second Covid injection on 12/30/2019 since she is had multiple episodes of diarrhea, consistent with 3-4.  Reports it feels like she is unable to make it to the bathroom as soon as she ambulates that she has episode of bowel incontinence.  Has been constant, states she feels like this is likely worsening, does endorse some nausea.  She was evaluated via telemedicine and recommended to be seen in the emergency department.  She has not noted any blood in her stool, no recent antibiotic use, no abdominal pain, no vomiting.  No prior history of C. Diff.   The history is provided by the patient.  Diarrhea Quality:  Unable to specify Severity:  Moderate Onset quality:  Sudden Number of episodes:  3-4 Duration:  2 days Timing:  Constant Progression:  Unchanged Relieved by:  Nothing Associated symptoms: fever   Associated symptoms: no abdominal pain, no headaches, no myalgias and no vomiting   Risk factors: no recent antibiotic use, no suspicious food intake and no travel to endemic areas        Past Medical History:  Diagnosis Date  . Diabetes mellitus   . Hypercholesteremia   . Hypertension   . Sleep apnea     Patient Active Problem List   Diagnosis Date Noted  . Left ankle injury, subsequent encounter 12/19/2017  . Class 2 severe obesity due to excess calories with serious comorbidity and body mass index (BMI) of 35.0 to 35.9 in adult ([place]) 03/05/2017  . 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 Nocturia more than twice per night 02/08/2016  . Noncompliance with CPAP treatment 02/08/2016  . CKD (chronic kidney disease), stage III  01/11/2014  . Hyperlipidemia associated with type 2 diabetes mellitus ([place]) 09/14/2013  . Hypertension associated with diabetes ([place]) 10/31/2011  . Diabetes mellitus ([place]) 10/31/2011    Past Surgical History:  Procedure Laterality Date  . TUBAL LIGATION       OB History   No obstetric history on file.     Family History  Problem Relation Age of Onset  . Sickle cell anemia Father     Social History   Tobacco Use  . Smoking status: Never Smoker  . Smokeless tobacco: Never Used  Vaping Use  . Vaping Use: Never used  Substance Use Topics  . Alcohol use: Yes    Comment: weekly  . Drug use: No    Home Medications Prior to Admission medications   Medication Sig Start Date End Date Taking? Authorizing Provider  amLODipine (NORVASC) 10 MG tablet Take 1 tablet (10 mg total) by mouth daily. 07/02/19   Saguier, Percell Miller, PA-C  atorvastatin (LIPITOR) 40 MG tablet Take 1 tablet (40 mg total) by mouth daily. 07/02/19   Saguier, Percell Miller, PA-C  blood glucose meter kit and supplies Check blood sugar 1-2 times a day (FOR ICD-E11.9). 03/26/18   Saguier, Percell Miller, PA-C  dextromethorphan-guaiFENesin (MUCINEX DM) 30-600 MG 12hr tablet Take 1 tablet by mouth 2 (two) times daily. 09/25/19   Fredia Sorrow, MD  diphenhydramine-acetaminophen (TYLENOL PM) 25-500 MG TABS Take 2 tablets by mouth every 4 (four) hours as  needed (pain).    [provider]  Dulaglutide (TRULICITY) 4.09 WJ/1.9JY SOPN INJECT 0.75 MG UNDER THE SKIN ONCE A WEEK 07/02/19   Saguier, Percell Miller, PA-C  hydrochlorothiazide (MICROZIDE) 12.5 MG capsule Take 1 capsule (12.5 mg total) by mouth daily. 07/02/19   Saguier, Percell Miller, PA-C  Lancets [facility] DELICA PLUS [id number]) MISC USE TO TEST 1-2 TIMES DAILY 07/28/18   Saguier, Percell Miller, PA-C  losartan (COZAAR) 25 MG tablet Take 1 tablet (25 mg total) by mouth daily. 07/02/19   Saguier, Percell Miller, PA-C  metoprolol succinate (TOPROL-XL) 50 MG 24 hr tablet Take 1 tablet (50 mg total) by mouth  daily. Take with or immediately following a meal. 07/02/19   Saguier, Percell Miller, PA-C  omeprazole (PRILOSEC) 20 MG capsule Take 1 capsule (20 mg total) by mouth daily. 07/02/19   Saguier, Percell Miller, PA-C  [facility] VERIO test strip Check blood sugar 1-2 times daily 05/28/18   Saguier, Percell Miller, PA-C  potassium chloride SA (K-DUR,KLOR-CON) 20 MEQ tablet Take 1 tablet (20 mEq total) by mouth daily. 09/11/18   Saguier, Percell Miller, PA-C  PRESCRIPTION MEDICATION Place 1 drop into both eyes daily. For dry eye and allergies.    [provider]    Allergies    Ace inhibitors  Review of Systems   Review of Systems  Constitutional: Positive for fever.  HENT: Negative for sore throat.   Respiratory: Negative for shortness of breath.   Cardiovascular: Negative for chest pain.  Gastrointestinal: Positive for diarrhea. Negative for abdominal pain, blood in stool and vomiting.  Genitourinary: Negative for flank pain.  Musculoskeletal: Negative for myalgias.  Neurological: Positive for weakness. Negative for light-headedness and headaches.  All other systems reviewed and are negative.   Physical Exam Updated Vital Signs BP 118/68 (BP Location: Right Arm)   Pulse 65   Temp 99.3 F (37.4 C)   Resp 13   Ht 5' 4" (1.626 m)   Wt 70.3 kg   SpO2 99%   BMI 26.61 kg/m   Physical Exam Vitals and nursing note reviewed.  Constitutional:      Appearance: Normal appearance. She is ill-appearing and diaphoretic.  HENT:     Head: Normocephalic and atraumatic.     Nose: Nose normal.     Mouth/Throat:     Mouth: Mucous membranes are moist.  Eyes:     Pupils: Pupils are equal, round, and reactive to light.  Cardiovascular:     Rate and Rhythm: Normal rate.  Pulmonary:     Effort: Pulmonary effort is normal.     Breath sounds: No wheezing.  Abdominal:     General: Abdomen is flat.     Palpations: Abdomen is soft.     Tenderness: There is no abdominal tenderness. There is no right CVA tenderness or left  CVA tenderness.  Musculoskeletal:     Cervical back: Normal range of motion and neck supple.  Skin:    General: Skin is warm.  Neurological:     Mental Status: She is alert and oriented to person, place, and time.     ED Results / Procedures / Treatments   Labs (all labs ordered are listed, but only abnormal results are displayed) Labs Reviewed  COMPREHENSIVE METABOLIC PANEL - Abnormal; Notable for the following components:      Result Value   Sodium 133 (*)    Potassium 3.4 (*)    Glucose, Bld 106 (*)    Creatinine, Ser 1.61 (*)    GFR calc non Af Amer 33 (*)  GFR calc Af Amer 38 (*)    All other components within normal limits  CBC - Abnormal; Notable for the following components:   RBC 3.71 (*)    Hemoglobin 10.4 (*)    HCT 30.7 (*)    All other components within normal limits  GASTROINTESTINAL PANEL BY PCR, STOOL (REPLACES STOOL CULTURE)  LIPASE, BLOOD  URINALYSIS, ROUTINE W REFLEX MICROSCOPIC    EKG None  Radiology No results found.  Procedures Procedures (including critical care time)  Medications Ordered in ED Medications  sodium chloride flush (NS) 0.9 % injection 3 mL (3 mLs Intravenous Not Given 12/31/19 1340)  acetaminophen (TYLENOL) tablet 650 mg (650 mg Oral Given 12/31/19 1301)  sodium chloride 0.9 % bolus 1,000 mL ( Intravenous Stopped 12/31/19 1536)  ondansetron (ZOFRAN) injection 4 mg (4 mg Intravenous Given 12/31/19 1433)    ED Course  I have reviewed the triage vital signs and the nursing notes.  Pertinent labs & imaging results that were available during my care of the patient were reviewed by me and considered in my medical decision making (see chart for details).    MDM Rules/Calculators/A&P     Patient with a past medical history of hypertension, diabetes presents to the ED with a chief complaint of diarrhea for the past 2 days.  Reports symptoms began after obtaining her second Covid vaccine, she is in the ED febrile with a temperature  102.9, reports she has had 3-4 episodes daily, these happen every time she attempts to ambulate and does not make it to the bathroom.  Evaluated by telehealth this morning and recommended to be seen in the ED.  Number evaluation patient is diaphoretic, feels clammy, abdomen is soft nontender to palpation.  Clear to auscultation.  Pharynx is somewhat dry. She is ill appearing, with no   Interpretation of her labs showed a CBC without any leukocytosis, hemoglobin is slightly decrease bu within her baseline. CMP with mild kalemia, suspect this is likely to GI loss.  Creatinine level is elevated, suspect this is likely due to dehydration after multiple episodes of diarrhea.  LFTs are unremarkable.  Lipase level is within normal limits.  UA is unremarkable without any signs of infection.  Is not have any abdominal pain on today's evaluation, no blood in her stool.  Around the ED, did have covid infection in March, suspect is is likely exacerbated reaction from the Covid vaccine she received two days ago.   6:07 PM Patient was rechecked by me, she is currently sleeping, fever has come down to 99.5.Rest of her vitals remain stable. Will obtain GI panel, although lower suspicion for C. Diff involvement, no recent abx use or hospitalizations.   7:35 PM reevaluated by me, has not had any diarrhea episodes while in the ED, we discussed collecting GI panel, patient would like that this deferred at this time as she is feeling much better after fluids and has her ride to pick her up.  He does feel like this is somewhat felt like a 24-hour COVID-19 infection.  Patient's vitals remained stable, fever has improved, I advised to continue taking Tylenol to help with pyrexia.  She otherwise in stable condition, abdomen remains soft, no abdominal pain nausea or vomiting.  Return precautions discussed at length.    Portions of this note were generated with Lobby[employer]. Dictation errors may occur despite best  attempts at proofreading.  Final Clinical Impression(s) / ED Diagnoses Final diagnoses:  Diarrhea, unspecified type  Post-vaccination fever  Rx / DC Orders ED Discharge Orders    None       Janeece Fitting, Hershal Coria 12/31/19 1937    Fredia Sorrow, MD 01/01/20 1218

## 2019-12-31 NOTE — Progress Notes (Signed)
[facility] at [facility] [address], [place], [place] [address] [phone]  Date:  12/31/2019   Name:  Olivia Morales   DOB:  04-14-1951   MRN:  [id number]  PCP:  Mackie Pai, PA-C    Chief Complaint: No chief complaint on file.   History of Present Illness:  Olivia Morales is a 69 y.o. very pleasant female patient who presents with the following:  Primary pt of Mackie Pai with history of HTN, controlled DM, hyperlipidemia, obesity, OSA, CKD  Lab Results  Component Value Date   HGBA1C 5.9 04/23/2019  I have not seen this pt in the past She had covid 19 this spring, tested positive 3/26.   She received bamlanivimab infusions   Virtual visit today for concerns of acute illness  Pt location is home, provider is at office Pt ID confirmed with 2 factors, she gives consent for virtual visit today The patient and myself are present on the call today  She got her first dose of covid 19 vaccine 48 hours ago; she notes that since she got her vaccine "I feel just like when I had Covid earlier this year" She has noted diarrhea which can be severe, she feels dehydrated.  She is able to drink only sips of water and is eating yogurt No vomiting but she has felt nauseated She is not checking her temp or other vitals at this time  The patient states that she feels sick, she does not feel well at all.  She would like to be seen at the ER and has a phone number who can drive her    Patient Active Problem List   Diagnosis Date Noted  . Left ankle injury, subsequent encounter 12/19/2017  . Class 2 severe obesity due to excess calories with serious comorbidity and body mass index (BMI) of 35.0 to 35.9 in adult ([place]) 03/05/2017  . 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 Nocturia more than twice per night 02/08/2016  . Noncompliance with CPAP treatment 02/08/2016  . CKD (chronic kidney disease), stage III 01/11/2014   . Hyperlipidemia associated with type 2 diabetes mellitus ([place]) 09/14/2013  . Hypertension associated with diabetes ([place]) 10/31/2011  . Diabetes mellitus ([place]) 10/31/2011    Past Medical History:  Diagnosis Date  . Diabetes mellitus   . Hypercholesteremia   . Hypertension   . Sleep apnea     Past Surgical History:  Procedure Laterality Date  . TUBAL LIGATION      Social History   Tobacco Use  . Smoking status: Never Smoker  . Smokeless tobacco: Never Used  Vaping Use  . Vaping Use: Never used  Substance Use Topics  . Alcohol use: Yes    Comment: weekly  . Drug use: No    Family History  Problem Relation Age of Onset  . Sickle cell anemia Father     Allergies  Allergen Reactions  . Ace Inhibitors     Cough    Medication list has been reviewed and updated.  Current Outpatient Medications on File Prior to Visit  Medication Sig Dispense Refill  . amLODipine (NORVASC) 10 MG tablet Take 1 tablet (10 mg total) by mouth daily. 90 tablet 1  . atorvastatin (LIPITOR) 40 MG tablet Take 1 tablet (40 mg total) by mouth daily. 90 tablet 1  . blood glucose meter kit and supplies Check blood sugar 1-2 times a day (FOR ICD-E11.9). 1 each 0  . dextromethorphan-guaiFENesin (  MUCINEX DM) 30-600 MG 12hr tablet Take 1 tablet by mouth 2 (two) times daily. 14 tablet 1  . diphenhydramine-acetaminophen (TYLENOL PM) 25-500 MG TABS Take 2 tablets by mouth every 4 (four) hours as needed (pain).    . Dulaglutide (TRULICITY) 6.16 WV/3.7TG SOPN INJECT 0.75 MG UNDER THE SKIN ONCE A WEEK 12 pen 1  . hydrochlorothiazide (MICROZIDE) 12.5 MG capsule Take 1 capsule (12.5 mg total) by mouth daily. 90 capsule 1  . Lancets (ONETOUCH DELICA PLUS [id number]) MISC USE TO TEST 1-2 TIMES DAILY 100 each 0  . losartan (COZAAR) 25 MG tablet Take 1 tablet (25 mg total) by mouth daily. 90 tablet 1  . metoprolol succinate (TOPROL-XL) 50 MG 24 hr tablet Take 1 tablet (50 mg total) by mouth daily. Take with or  immediately following a meal. 90 tablet 1  . omeprazole (PRILOSEC) 20 MG capsule Take 1 capsule (20 mg total) by mouth daily. 90 capsule 1  . ONETOUCH VERIO test strip Check blood sugar 1-2 times daily 100 each 12  . potassium chloride SA (K-DUR,KLOR-CON) 20 MEQ tablet Take 1 tablet (20 mEq total) by mouth daily. 5 tablet 0  . PRESCRIPTION MEDICATION Place 1 drop into both eyes daily. For dry eye and allergies.     No current facility-administered medications on file prior to visit.    Review of Systems:  As per HPI- otherwise negative.   Physical Examination: There were no vitals filed for this visit. There were no vitals filed for this visit. There is no height or weight on file to calculate BMI. Ideal Body Weight:    Not checking vital signs at home Spoke with patient over the telephone.  Speaking clearly, no cough or wheezing is noted  Assessment and Plan: Diarrhea, unspecified type  Older patient with several chronic health problems seen virtually today with concern of illness Pt did not have video connection so we did a phone visit today  Spoke to patient for approximately 5 minutes She reports inability to eat or drink, nausea, diarrhea, feeling dizzy and "terrible" I advised her to come to the emergency department for further evaluation, she may need IV hydration Signed Lamar Blinks, MD

## 2020-01-12 ENCOUNTER — Encounter: Payer: Medicare Other | Admitting: Medical

## 2020-01-19 ENCOUNTER — Ambulatory Visit (INDEPENDENT_AMBULATORY_CARE_PROVIDER_SITE_OTHER): Payer: Medicare Other | Admitting: Medical

## 2020-01-19 ENCOUNTER — Other Ambulatory Visit: Payer: Self-pay

## 2020-01-19 ENCOUNTER — Ambulatory Visit (HOSPITAL_BASED_OUTPATIENT_CLINIC_OR_DEPARTMENT_OTHER)
Admission: RE | Admit: 2020-01-19 | Discharge: 2020-01-19 | Disposition: A | Discharge status: Home / Self Care | Payer: Medicare Other | Source: Ambulatory Visit | Attending: Medical | Admitting: Medical

## 2020-01-19 VITALS — BP 118/66 | HR 60 | Temp 98.2°F | Resp 18 | Ht 64.0 in | Wt 170.6 lb

## 2020-01-19 DIAGNOSIS — E2839 Other primary ovarian failure: Secondary | ICD-10-CM | POA: Diagnosis not present

## 2020-01-19 DIAGNOSIS — E785 Hyperlipidemia, unspecified: Secondary | ICD-10-CM | POA: Diagnosis not present

## 2020-01-19 DIAGNOSIS — E1165 Type 2 diabetes mellitus with hyperglycemia: Secondary | ICD-10-CM | POA: Diagnosis not present

## 2020-01-19 DIAGNOSIS — M79672 Pain in left foot: Secondary | ICD-10-CM | POA: Diagnosis present

## 2020-01-19 DIAGNOSIS — Z13820 Encounter for screening for osteoporosis: Secondary | ICD-10-CM | POA: Diagnosis not present

## 2020-01-19 DIAGNOSIS — Z23 Encounter for immunization: Secondary | ICD-10-CM | POA: Diagnosis not present

## 2020-01-19 DIAGNOSIS — Z124 Encounter for screening for malignant neoplasm of cervix: Secondary | ICD-10-CM

## 2020-01-19 DIAGNOSIS — E1169 Type 2 diabetes mellitus with other specified complication: Secondary | ICD-10-CM | POA: Diagnosis not present

## 2020-01-19 DIAGNOSIS — R32 Unspecified urinary incontinence: Secondary | ICD-10-CM

## 2020-01-19 DIAGNOSIS — N183 Chronic kidney disease, stage 3 unspecified: Secondary | ICD-10-CM

## 2020-01-19 LAB — COMPREHENSIVE METABOLIC PANEL
ALT: 37 U/L — ABNORMAL HIGH (ref 0–35)
AST: 62 U/L — ABNORMAL HIGH (ref 0–37)
Albumin: 4.3 g/dL (ref 3.5–5.2)
Alkaline Phosphatase: 79 U/L (ref 39–117)
BUN: 29 mg/dL — ABNORMAL HIGH (ref 6–23)
CO2: 31 mEq/L (ref 19–32)
Calcium: 10.5 mg/dL (ref 8.4–10.5)
Chloride: 104 mEq/L (ref 96–112)
Creatinine, Ser: 1.59 mg/dL — ABNORMAL HIGH (ref 0.40–1.20)
GFR: 39.13 mL/min — ABNORMAL LOW (ref >60.00)
Glucose, Bld: 97 mg/dL (ref 70–99)
Potassium: 4.2 mEq/L (ref 3.5–5.1)
Sodium: 142 mEq/L (ref 135–145)
Total Bilirubin: 0.7 mg/dL (ref 0.2–1.2)
Total Protein: 7.1 g/dL (ref 6.0–8.3)

## 2020-01-19 LAB — LIPID PANEL
Cholesterol: 139 mg/dL (ref 0–200)
HDL: 43.4 mg/dL (ref >39.00)
LDL Cholesterol: 77 mg/dL (ref 0–99)
NonHDL: 95.45
Total CHOL/HDL Ratio: 3
Triglycerides: 92 mg/dL (ref 0.0–149.0)
VLDL: 18.4 mg/dL (ref 0.0–40.0)

## 2020-01-19 LAB — URIC ACID: Uric Acid, Serum: 8.4 mg/dL — ABNORMAL HIGH (ref 2.4–7.0)

## 2020-01-19 MED ORDER — OXYBUTYNIN CHLORIDE ER 5 MG PO TB24
5.0000 mg | ORAL_TABLET | Freq: Every day | ORAL | 0 refills | Status: DC
Start: 2020-01-19 — End: 2020-10-27

## 2020-01-19 MED FILL — OXYBUTYNIN CL ER 5 MG TAB: 5 | 30 days supply | Qty: 30 | Fill #0

## 2020-01-19 NOTE — Progress Notes (Signed)
Subjective:    Patient ID: Olivia Morales, female    DOB: 22-May-1951, 69 y.o.   MRN: [id number]  HPI  Pt in for exam.  Pt update me that she had covid in past/march. Then 3 week ago had vaccine. She got moderna vaccine 1st shot. After she got vaccine next day she felt bad with fever, chills, bodyaches and lost appetite. She got dehydrated and went to the ED. She felt dizzy as well. It took her 30 days or so to recover from covid infection.  Pt has some left foot toe pain. Even sheets hurt when they touch the toe. This has been present since march. Notes more at night.  Pt has high cholesterol. On lipitor.  Hx of htn and on losartan.  Pt is diabetic and on trulicity. Last a1c was 5.9. Pt is still on trulicity.     Review of Systems  Constitutional: Negative for chills, fatigue and fever.  HENT: Negative for congestion, drooling and ear pain.   Respiratory: Negative for cough, chest tightness, shortness of breath and wheezing.   Cardiovascular: Negative for chest pain and palpitations.  Gastrointestinal: Negative for abdominal pain, diarrhea and vomiting.  Genitourinary:       Incontinence at night since march. Has to wear depends.  Musculoskeletal: Negative for back pain, neck pain and neck stiffness.       Foot pain.  Skin: Negative for rash.  Neurological: Negative for dizziness, facial asymmetry, weakness and numbness.  Hematological: Negative for adenopathy. Does not bruise/bleed easily.  Psychiatric/Behavioral: Negative for behavioral problems and confusion. The patient is not nervous/anxious.     Past Medical History:  Diagnosis Date   Diabetes mellitus    Hypercholesteremia    Hypertension    Sleep apnea      Social History   Socioeconomic History   Marital status: Widowed    Spouse name: Not on file   Number of children: Not on file   Years of education: Not on file   Highest education level: Not on file  Occupational History   Not on  file  Tobacco Use   Smoking status: Never Smoker   Smokeless tobacco: Never Used  Vaping Use   Vaping Use: Never used  Substance and Sexual Activity   Alcohol use: Yes    Comment: weekly   Drug use: No   Sexual activity: Not on file  Other Topics Concern   Not on file  Social History Narrative   Not on file   Social Determinants of Health   Financial Resource Strain:    Difficulty of Paying Living Expenses:   Food Insecurity:    Worried About Charity fundraiser in the Last Year:    Arboriculturist in the Last Year:   Transportation Needs:    Film/video editor (Medical):    Lack of Transportation (Non-Medical):   Physical Activity:    Days of Exercise per Week:    Minutes of Exercise per Session:   Stress:    Feeling of Stress :   Social Connections:    Frequency of Communication with Friends and Family:    Frequency of Social Gatherings with Friends and Family:    Attends Religious Services:    Active Member of Clubs or Organizations:    Attends Archivist Meetings:    Marital Status:   Intimate Partner Violence:    Fear of Current or Ex-Partner:    Emotionally Abused:    Physically Abused:  Sexually Abused:     Past Surgical History:  Procedure Laterality Date   TUBAL LIGATION      Family History  Problem Relation Age of Onset   Sickle cell anemia Father     Allergies  Allergen Reactions   Ace Inhibitors     Cough    Current Outpatient Medications on File Prior to Visit  Medication Sig Dispense Refill   amLODipine (NORVASC) 10 MG tablet Take 1 tablet (10 mg total) by mouth daily. 90 tablet 1   atorvastatin (LIPITOR) 40 MG tablet Take 1 tablet (40 mg total) by mouth daily. 90 tablet 1   blood glucose meter kit and supplies Check blood sugar 1-2 times a day (FOR ICD-E11.9). 1 each 0   diphenhydramine-acetaminophen (TYLENOL PM) 25-500 MG TABS Take 2 tablets by mouth every 4 (four) hours as needed  (pain).     Dulaglutide (TRULICITY) 4.19 QQ/2.2LN SOPN INJECT 0.75 MG UNDER THE SKIN ONCE A WEEK 12 pen 1   hydrochlorothiazide (MICROZIDE) 12.5 MG capsule Take 1 capsule (12.5 mg total) by mouth daily. 90 capsule 1   Lancets (ONETOUCH DELICA PLUS [id number]) MISC USE TO TEST 1-2 TIMES DAILY 100 each 0   losartan (COZAAR) 25 MG tablet Take 1 tablet (25 mg total) by mouth daily. 90 tablet 1   metoprolol succinate (TOPROL-XL) 50 MG 24 hr tablet Take 1 tablet (50 mg total) by mouth daily. Take with or immediately following a meal. 90 tablet 1   omeprazole (PRILOSEC) 20 MG capsule Take 1 capsule (20 mg total) by mouth daily. 90 capsule 1   ONETOUCH VERIO test strip Check blood sugar 1-2 times daily 100 each 12   potassium chloride SA (K-DUR,KLOR-CON) 20 MEQ tablet Take 1 tablet (20 mEq total) by mouth daily. 5 tablet 0   dextromethorphan-guaiFENesin (MUCINEX DM) 30-600 MG 12hr tablet Take 1 tablet by mouth 2 (two) times daily. (Patient not taking: Reported on 01/19/2020) 14 tablet 1   PRESCRIPTION MEDICATION Place 1 drop into both eyes daily. For dry eye and allergies. (Patient not taking: Reported on 01/19/2020)     No current facility-administered medications on file prior to visit.    BP 118/66    Pulse 60    Temp 98.2 F (36.8 C) (Oral)    Resp 18    Ht _0  (1.626 m)    Wt 170 lb 9.6 oz (77.4 kg)    SpO2 90%    BMI 29.28 kg/m       Objective:   Physical Exam  General Mental Status- Alert. General Appearance- Not in acute distress.   Skin General: Color- Normal Color. Moisture- Normal Moisture.  Neck Carotid Arteries- Normal color. Moisture- Normal Moisture. No carotid bruits. No JVD.  Chest and Lung Exam Auscultation: Breath Sounds:-Normal.  Cardiovascular Auscultation:Rythm- Regular. Murmurs & Other Heart Sounds:Auscultation of the heart reveals- No Murmurs.  Abdomen Inspection:-Inspeection Normal. Palpation/Percussion:Note:No mass. Palpation and Percussion of the  abdomen reveal- Non Tender, Non Distended + BS, no rebound or guarding.   Neurologic Cranial Nerve exam:- CN III-XII intact(No nystagmus), symmetric smile. Strength:- 5/5 equal and symmetric strength both upper and lower extremities.  Feet- see quality metrics.  Left foot-Thick nails that appears infected with fungus. Toe not swollen or red.      Assessment & Plan:  Regarding your question on covid vaccine. Appears you had strong response to 1st vaccine after having covid. That can be expected. It would be up to you if you get but your may  have worse reaction since you already had covid and vaccine.  Dexa scan screening order placed.  Get pneumonia 23 vaccine today.  For htn, high cholesterol and diabetes continue current meds. Get cmp, lipid panel and a1c.  For urinary incontinence and cervical cancer screening put in gyn referral. Rx ditropan during interim.  For foot pain will get xray of foot and uric acid level. Then place referral to podiatrist. You request diabetic shoes. They can evaluate you for that.   Follow up 3 months or as needed  Mackie Pai, PA-C   Time spent with patient today was 40  minutes which consisted of chart review, discussing diagnoses, work up, treatment, answering question, disucssing risk vs benefit of 2nd covid vaccine  and documentation.

## 2020-01-19 NOTE — Patient Instructions (Addendum)
Regarding your question on covid vaccine. Appears you had strong response to 1st vaccine after having covid. That can be expected. It would be up to you if you get but your may have worse reaction since you already had covid and vaccine.  Dexa scan screening order placed.  Get pneumonia 23 vaccine today.  For htn, high cholesterol and diabetes continue current meds. Get cmp, lipid panel and a1c.  For urinary incontinence and cervical cancer screening put in gyn referral. Rx ditropan during interim.  For foot pain will get xray of foot and uric acid level. Then place referral to podiatrist. You request diabetic shoes. They can evaluate you for that.   Follow up 3 months or as needed

## 2020-01-22 ENCOUNTER — Telehealth: Payer: Self-pay

## 2020-01-22 ENCOUNTER — Other Ambulatory Visit: Payer: Self-pay

## 2020-01-22 ENCOUNTER — Other Ambulatory Visit (INDEPENDENT_AMBULATORY_CARE_PROVIDER_SITE_OTHER): Payer: Medicare Other

## 2020-01-22 DIAGNOSIS — E1165 Type 2 diabetes mellitus with hyperglycemia: Secondary | ICD-10-CM

## 2020-01-22 LAB — HEMOGLOBIN A1C: Hgb A1c MFr Bld: 5.9 % (ref 4.6–6.5)

## 2020-01-22 NOTE — Telephone Encounter (Signed)
Patient stopped by office in regards to sending  Gout medication that was stated in her lab results.

## 2020-01-23 ENCOUNTER — Telehealth: Payer: Self-pay | Admitting: Medical

## 2020-01-23 MED ORDER — METHYLPREDNISOLONE 4 MG PO TABS
ORAL_TABLET | ORAL | 0 refills | Status: DC
Start: 2020-01-23 — End: 2020-10-27

## 2020-01-23 NOTE — Telephone Encounter (Signed)
Rx medrol sent to pt pharmacy. 

## 2020-02-11 ENCOUNTER — Telehealth: Payer: Self-pay | Admitting: Medical

## 2020-02-11 NOTE — Telephone Encounter (Signed)
Open in error

## 2020-02-15 ENCOUNTER — Other Ambulatory Visit: Payer: Self-pay | Admitting: Medical

## 2020-02-16 ENCOUNTER — Other Ambulatory Visit: Payer: Self-pay

## 2020-02-16 ENCOUNTER — Ambulatory Visit: Payer: Medicare Other | Admitting: Medical

## 2020-02-16 VITALS — BP 139/77 | HR 59 | Resp 18 | Ht 64.0 in | Wt 169.6 lb

## 2020-02-16 DIAGNOSIS — M79672 Pain in left foot: Secondary | ICD-10-CM | POA: Diagnosis not present

## 2020-02-16 DIAGNOSIS — R5383 Other fatigue: Secondary | ICD-10-CM

## 2020-02-16 LAB — CBC WITH DIFFERENTIAL/PLATELET
Basophils Absolute: 0 10*3/uL (ref 0.0–0.1)
Basophils Relative: 1.6 % (ref 0.0–3.0)
Eosinophils Absolute: 0.2 10*3/uL (ref 0.0–0.7)
Eosinophils Relative: 5.6 % — ABNORMAL HIGH (ref 0.0–5.0)
HCT: 30 % — ABNORMAL LOW (ref 36.0–46.0)
Hemoglobin: 10 g/dL — ABNORMAL LOW (ref 12.0–15.0)
Lymphocytes Relative: 35.3 % (ref 12.0–46.0)
Lymphs Abs: 1.1 10*3/uL (ref 0.7–4.0)
MCHC: 33.1 g/dL (ref 30.0–36.0)
MCV: 84.6 fl (ref 78.0–100.0)
Monocytes Absolute: 0.3 10*3/uL (ref 0.1–1.0)
Monocytes Relative: 8.5 % (ref 3.0–12.0)
Neutro Abs: 1.5 10*3/uL (ref 1.4–7.7)
Neutrophils Relative %: 49 % (ref 43.0–77.0)
Platelets: 255 10*3/uL (ref 150.0–400.0)
RBC: 3.55 Mil/uL — ABNORMAL LOW (ref 3.87–5.11)
RDW: 14.5 % (ref 11.5–15.5)
WBC: 3 10*3/uL — ABNORMAL LOW (ref 4.0–10.5)

## 2020-02-16 LAB — COMPREHENSIVE METABOLIC PANEL
ALT: 10 U/L (ref 0–35)
AST: 14 U/L (ref 0–37)
Albumin: 4.2 g/dL (ref 3.5–5.2)
Alkaline Phosphatase: 76 U/L (ref 39–117)
BUN: 21 mg/dL (ref 6–23)
CO2: 30 mEq/L (ref 19–32)
Calcium: 9.9 mg/dL (ref 8.4–10.5)
Chloride: 103 mEq/L (ref 96–112)
Creatinine, Ser: 1.57 mg/dL — ABNORMAL HIGH (ref 0.40–1.20)
GFR: 39.7 mL/min — ABNORMAL LOW (ref >60.00)
Glucose, Bld: 157 mg/dL — ABNORMAL HIGH (ref 70–99)
Potassium: 4 mEq/L (ref 3.5–5.1)
Sodium: 140 mEq/L (ref 135–145)
Total Bilirubin: 0.8 mg/dL (ref 0.2–1.2)
Total Protein: 6.8 g/dL (ref 6.0–8.3)

## 2020-02-16 LAB — VITAMIN B12: Vitamin B-12: 99 pg/mL — ABNORMAL LOW (ref 211–911)

## 2020-02-16 LAB — T4, FREE: Free T4: 0.84 ng/dL (ref 0.60–1.60)

## 2020-02-16 LAB — IRON: Iron: 89 ug/dL (ref 42–145)

## 2020-02-16 LAB — TSH: TSH: 1.99 u[IU]/mL (ref 0.35–4.50)

## 2020-02-16 NOTE — Progress Notes (Signed)
Subjective:    Patient ID: Olivia Morales, female    DOB: [birth year], 69 y.o.   MRN: [id number]  HPI  Pt in with left foot pain on tip of toes. Pt has discolored nails. I had ordered xray of left foot in past. I had informed by my chart negative foot xray. Had asked who was her former podiatrist. I did not get response. So referral not placed.   Pt has had foot pain in past before pandemic started but did not make office visit when referred her podiatrist due to pandemic.  Pt states past month has been fatigued. States new. She has long history of stable anemia and sickle cell trait per her report. HB/hct has been low but stable over past 4 years.        Review of Systems  Constitutional: Positive for fatigue. Negative for chills and fever.  Respiratory: Negative for cough, chest tightness, shortness of breath and wheezing.   Cardiovascular: Negative for chest pain and palpitations.  Gastrointestinal: Negative for abdominal pain.  Musculoskeletal: Negative for back pain.  Skin: Negative for rash.  Neurological: Negative for dizziness, seizures, weakness and headaches.  Hematological: Negative for adenopathy. Does not bruise/bleed easily.  Psychiatric/Behavioral: Negative for behavioral problems and confusion.    Past Medical History:  Diagnosis Date  . Diabetes mellitus   . Hypercholesteremia   . Hypertension   . Sleep apnea      Social History   Socioeconomic History  . Marital status: Widowed    Spouse name: Not on file  . Number of children: Not on file  . Years of education: Not on file  . Highest education level: Not on file  Occupational History  . Not on file  Tobacco Use  . Smoking status: Never Smoker  . Smokeless tobacco: Never Used  Vaping Use  . Vaping Use: Never used  Substance and Sexual Activity  . Alcohol use: Yes    Comment: weekly  . Drug use: No  . Sexual activity: Not on file  Other Topics Concern  . Not on file  Social History  Narrative  . Not on file   Social Determinants of Health   Financial Resource Strain:   . Difficulty of Paying Living Expenses:   Food Insecurity:   . Worried About Charity fundraiser in the Last Year:   . Arboriculturist in the Last Year:   Transportation Needs:   . Film/video editor (Medical):   Marland Kitchen Lack of Transportation (Non-Medical):   Physical Activity:   . Days of Exercise per Week:   . Minutes of Exercise per Session:   Stress:   . Feeling of Stress :   Social Connections:   . Frequency of Communication with Friends and Family:   . Frequency of Social Gatherings with Friends and Family:   . Attends Religious Services:   . Active Member of Clubs or Organizations:   . Attends Archivist Meetings:   Marland Kitchen Marital Status:   Intimate Partner Violence:   . Fear of Current or Ex-Partner:   . Emotionally Abused:   Marland Kitchen Physically Abused:   . Sexually Abused:     Past Surgical History:  Procedure Laterality Date  . TUBAL LIGATION      Family History  Problem Relation Age of Onset  . Sickle cell anemia Father     Allergies  Allergen Reactions  . Ace Inhibitors     Cough    Current Outpatient Medications  on File Prior to Visit  Medication Sig Dispense Refill  . amLODipine (NORVASC) 10 MG tablet Take 1 tablet (10 mg total) by mouth daily. 90 tablet 1  . atorvastatin (LIPITOR) 40 MG tablet Take 1 tablet (40 mg total) by mouth daily. 90 tablet 1  . blood glucose meter kit and supplies Check blood sugar 1-2 times a day (FOR ICD-E11.9). 1 each 0  . dextromethorphan-guaiFENesin (MUCINEX DM) 30-600 MG 12hr tablet Take 1 tablet by mouth 2 (two) times daily. (Patient not taking: Reported on 01/19/2020) 14 tablet 1  . diphenhydramine-acetaminophen (TYLENOL PM) 25-500 MG TABS Take 2 tablets by mouth every 4 (four) hours as needed (pain).    . Dulaglutide (TRULICITY) 8.52 DP/8.2UM SOPN INJECT THE CONTENTS OF 1  PEN SUBCUTANEOUSLY ONCE  WEEKLY 6 mL 3  .  hydrochlorothiazide (MICROZIDE) 12.5 MG capsule Take 1 capsule (12.5 mg total) by mouth daily. 90 capsule 1  . Lancets (ONETOUCH DELICA PLUS [id number]) MISC USE TO TEST 1-2 TIMES DAILY 100 each 0  . losartan (COZAAR) 25 MG tablet Take 1 tablet (25 mg total) by mouth daily. 90 tablet 1  . methylPREDNISolone (MEDROL) 4 MG tablet 4 tab po day 1, 3 tab po day 2, 2 tab po day 3 and 1 tab po day 4. (Patient not taking: Reported on 02/16/2020) 10 tablet 0  . metoprolol succinate (TOPROL-XL) 50 MG 24 hr tablet Take 1 tablet (50 mg total) by mouth daily. Take with or immediately following a meal. 90 tablet 1  . omeprazole (PRILOSEC) 20 MG capsule Take 1 capsule (20 mg total) by mouth daily. 90 capsule 1  . ONETOUCH VERIO test strip Check blood sugar 1-2 times daily 100 each 12  . oxybutynin (DITROPAN XL) 5 MG 24 hr tablet Take 1 tablet (5 mg total) by mouth at bedtime. 30 tablet 0  . potassium chloride SA (K-DUR,KLOR-CON) 20 MEQ tablet Take 1 tablet (20 mEq total) by mouth daily. 5 tablet 0  . PRESCRIPTION MEDICATION Place 1 drop into both eyes daily. For dry eye and allergies. (Patient not taking: Reported on 01/19/2020)     No current facility-administered medications on file prior to visit.    BP 139/77   Pulse (!) 59   Resp 18   Ht '5\' 4"'  (1.626 m)   Wt 169 lb 9.6 oz (76.9 kg)   SpO2 100%   BMI 29.11 kg/m       Objective:   Physical Exam  General Mental Status- Alert. General Appearance- Not in acute distress.     Chest and Lung Exam Auscultation: Breath Sounds:-Normal.  Cardiovascular Auscultation:Rythm- Regular. Murmurs & Other Heart Sounds:Auscultation of the heart reveals- No Murmurs.  Abdomen Inspection:-Inspeection Normal. Palpation/Percussion:Note:No mass. Palpation and Percussion of the abdomen reveal- Non Tender, Non Distended + BS, no rebound or guarding.    Neurologic Cranial Nerve exam:- CN III-XII intact(No nystagmus), symmetric smile. Strength:- 5/5 equal and  symmetric strength both upper and lower extremities.  Left foot- no swelling, no redness, no warmth. Has thick dystrophic nails she states toes area always mild sore.    Assessment & Plan:  Refer to podiatrist for foot pain, dystrophic nails and diabetes(pt interested in getting diabetic shoes). [facility] number [phone].  Hx of anemia. Stable anemia in 10 hemoglobin range compared to prior past 2 years.  For recent fatigue will check cbc, cmp, tsh, t4,, iron,  b12, b1 and vit D  Follow up date to be determined after lab review.  Time spent with patient today was 25  minutes which consisted of chart rediew, discussing diagnosis, work up, referral  and documentation.  Mackie Pai, PA-C

## 2020-02-16 NOTE — Patient Instructions (Addendum)
Refer to podiatrist for foot pain, dystrophic nails and diabetes(pt interested in getting diabetic shoes). [facility] number [phone].  Hx of anemia. Stable anemia in 10 hemoglobin range compared to prior past 2 years.  For recent fatigue will check cbc, cmp, tsh, t4,, iron,  b12, b1 and vit D  Follow up date to be determined after lab review.

## 2020-02-22 LAB — VITAMIN D 1,25 DIHYDROXY

## 2020-02-22 LAB — VITAMIN B1: Vitamin B1 (Thiamine): 11 nmol/L (ref 8–30)

## 2020-03-02 ENCOUNTER — Ambulatory Visit: Payer: Medicare Other

## 2020-03-02 ENCOUNTER — Ambulatory Visit (INDEPENDENT_AMBULATORY_CARE_PROVIDER_SITE_OTHER): Payer: Medicare Other

## 2020-03-02 ENCOUNTER — Other Ambulatory Visit: Payer: Self-pay

## 2020-03-02 DIAGNOSIS — E538 Deficiency of other specified B group vitamins: Secondary | ICD-10-CM | POA: Diagnosis not present

## 2020-03-02 MED ORDER — CYANOCOBALAMIN 1000 MCG/ML IJ SOLN
1000.0000 ug | Freq: Once | INTRAMUSCULAR | Status: AC
  Administered 2020-03-02: 1000 ug via INTRAMUSCULAR

## 2020-03-02 NOTE — Progress Notes (Addendum)
Pt here today for B12 injection. # 1 of 4 weekly.   Cyanocobalamin 14mL injected into L deltoid. Pt tolerated injection well.  Next in 1 week. Nurse visit scheduled 03/08/20.   Approved injection.  Esperanza Richters, PA-C

## 2020-03-08 ENCOUNTER — Other Ambulatory Visit: Payer: Self-pay

## 2020-03-08 ENCOUNTER — Ambulatory Visit (INDEPENDENT_AMBULATORY_CARE_PROVIDER_SITE_OTHER): Payer: Medicare Other

## 2020-03-08 DIAGNOSIS — E538 Deficiency of other specified B group vitamins: Secondary | ICD-10-CM

## 2020-03-08 MED ORDER — CYANOCOBALAMIN 1000 MCG/ML IJ SOLN
1000.0000 ug | Freq: Once | INTRAMUSCULAR | Status: AC
Start: 2020-03-08 — End: 2020-03-08
  Administered 2020-03-08: 1000 ug via INTRAMUSCULAR

## 2020-03-08 NOTE — Progress Notes (Addendum)
Pt here for weekly B12 injection per Esperanza Richters  B12 given IM, and pt tolerated injection well.  Next B12 injection scheduled for next week.    Agree with administration.  Esperanza Richters, PA-C

## 2020-03-15 ENCOUNTER — Other Ambulatory Visit: Payer: Self-pay

## 2020-03-15 ENCOUNTER — Telehealth: Payer: Self-pay | Admitting: Medical

## 2020-03-15 ENCOUNTER — Ambulatory Visit (INDEPENDENT_AMBULATORY_CARE_PROVIDER_SITE_OTHER): Payer: Medicare Other

## 2020-03-15 DIAGNOSIS — E538 Deficiency of other specified B group vitamins: Secondary | ICD-10-CM

## 2020-03-15 MED ORDER — CYANOCOBALAMIN 1000 MCG/ML IJ SOLN
1000.0000 ug | Freq: Once | INTRAMUSCULAR | Status: AC
Start: 2020-03-15 — End: 2020-03-15
  Administered 2020-03-15: 1000 ug via INTRAMUSCULAR

## 2020-03-15 NOTE — Telephone Encounter (Signed)
Paper work placed in [employer]

## 2020-03-15 NOTE — Progress Notes (Addendum)
Pt here for weekly B12 injection per [name]  B12 given IM , and pt tolerated injection well.  Next B12 injection scheduled for next week.   Agree with administration of b12.  Esperanza Richters, PA-C

## 2020-03-15 NOTE — Telephone Encounter (Signed)
Pt dropped off document to be filled out by provider ([employer] [facility]- Diabetic shoes form 4 pages) Pt would like to be called when ready at [phone]. Document put at front office tray under providers name.

## 2020-03-17 ENCOUNTER — Telehealth: Payer: Self-pay | Admitting: Medical

## 2020-03-17 NOTE — Telephone Encounter (Signed)
Pt dropped of diabetic shoes/insert forms. This needs to be filled out by podiatrist. I believe she has one as I have referred her to one in the past. Please call pt and ask her to pick up forms. Specialist needs to fill forms out.   She should be able to call and schedule follow up with podiatrist that she has already seen.

## 2020-03-18 NOTE — Telephone Encounter (Signed)
Patient called and lvm to return call

## 2020-03-21 ENCOUNTER — Telehealth: Payer: Self-pay

## 2020-03-21 NOTE — Telephone Encounter (Signed)
Does patient need a lab visit or an actual follow up visit with you for recheck on b12 and vitamin D

## 2020-03-22 ENCOUNTER — Ambulatory Visit (INDEPENDENT_AMBULATORY_CARE_PROVIDER_SITE_OTHER): Payer: Medicare Other | Admitting: *Deleted

## 2020-03-22 ENCOUNTER — Other Ambulatory Visit: Payer: Self-pay

## 2020-03-22 DIAGNOSIS — E538 Deficiency of other specified B group vitamins: Secondary | ICD-10-CM | POA: Diagnosis not present

## 2020-03-22 MED ORDER — CYANOCOBALAMIN 1000 MCG/ML IJ SOLN
1000.0000 ug | Freq: Once | INTRAMUSCULAR | Status: AC
  Administered 2020-03-22: 1000 ug via INTRAMUSCULAR

## 2020-03-22 NOTE — Telephone Encounter (Signed)
Pt given form during NV

## 2020-03-22 NOTE — Progress Notes (Addendum)
Pt here for weekly B12 injection per [name]   B12 given in right deltoid, and pt tolerated injection well.  Patient schedule for follow up in 2 weeks to recheck.  Agree with b12 injection.  Esperanza Richters, PA-C

## 2020-03-25 ENCOUNTER — Other Ambulatory Visit: Payer: Self-pay | Admitting: Medical

## 2020-04-05 ENCOUNTER — Ambulatory Visit: Payer: Medicare Other | Admitting: Medical

## 2020-04-13 ENCOUNTER — Ambulatory Visit: Payer: Medicare Other | Admitting: Medical

## 2020-04-27 ENCOUNTER — Encounter: Payer: Self-pay | Admitting: Medical

## 2020-04-27 ENCOUNTER — Ambulatory Visit (INDEPENDENT_AMBULATORY_CARE_PROVIDER_SITE_OTHER): Payer: Medicare Other | Admitting: Medical

## 2020-04-27 ENCOUNTER — Telehealth: Payer: Self-pay

## 2020-04-27 ENCOUNTER — Other Ambulatory Visit: Payer: Self-pay

## 2020-04-27 VITALS — BP 128/80 | HR 66 | Resp 18 | Ht 64.0 in | Wt 171.0 lb

## 2020-04-27 DIAGNOSIS — I10 Essential (primary) hypertension: Secondary | ICD-10-CM | POA: Diagnosis not present

## 2020-04-27 DIAGNOSIS — Z23 Encounter for immunization: Secondary | ICD-10-CM

## 2020-04-27 DIAGNOSIS — E785 Hyperlipidemia, unspecified: Secondary | ICD-10-CM

## 2020-04-27 DIAGNOSIS — E786 Lipoprotein deficiency: Secondary | ICD-10-CM

## 2020-04-27 DIAGNOSIS — M2011 Hallux valgus (acquired), right foot: Secondary | ICD-10-CM

## 2020-04-27 DIAGNOSIS — M2012 Hallux valgus (acquired), left foot: Secondary | ICD-10-CM

## 2020-04-27 DIAGNOSIS — E538 Deficiency of other specified B group vitamins: Secondary | ICD-10-CM

## 2020-04-27 DIAGNOSIS — R944 Abnormal results of kidney function studies: Secondary | ICD-10-CM

## 2020-04-27 DIAGNOSIS — E1165 Type 2 diabetes mellitus with hyperglycemia: Secondary | ICD-10-CM | POA: Diagnosis not present

## 2020-04-27 DIAGNOSIS — E1140 Type 2 diabetes mellitus with diabetic neuropathy, unspecified: Secondary | ICD-10-CM

## 2020-04-27 NOTE — Progress Notes (Addendum)
Subjective:    Patient ID: Olivia Morales, female    DOB: [birth year], 69 y.o.   MRN: [id number]  HPI  Pt in for follow up.  Pt wants to get diabetic shoes.   Form now requires MD or DO to sign. Pt aware I am PA. Podiatrist can't fill out forms now.    Pt aware have not verified with them that will sign off.  Pt is diabetic for 11 years.   Pt a1c in summer  5.9. most recently a1c was 7.1. pt is on Trulicity 0.32 weekly. She has been eating a lot of fresh fruit.   Recently sharp pains at night in feet neuropathic type pain. Does have coallous. Monofilament exam normal. No hx of ulcers   Pt has been on b12 injection for 4-5 weeks. Now on oral otc b12.   Pt has  Htn. BP 140/79. Today she has not taken meds.         Review of Systems  Constitutional: Negative for chills, fatigue and fever.  Respiratory: Negative for cough, chest tightness, shortness of breath and wheezing.   Cardiovascular: Negative for chest pain and palpitations.  Gastrointestinal: Negative for abdominal distention, abdominal pain and diarrhea.  Musculoskeletal: Negative for back pain and myalgias.  Skin: Negative for rash.  Hematological: Negative for adenopathy. Does not bruise/bleed easily.  Psychiatric/Behavioral: Negative for behavioral problems and confusion.   Past Medical History:  Diagnosis Date  . Diabetes mellitus   . Hypercholesteremia   . Hypertension   . Sleep apnea      Social History   Socioeconomic History  . Marital status: Widowed    Spouse name: Not on file  . Number of children: Not on file  . Years of education: Not on file  . Highest education level: Not on file  Occupational History  . Not on file  Tobacco Use  . Smoking status: Never Smoker  . Smokeless tobacco: Never Used  Vaping Use  . Vaping Use: Never used  Substance and Sexual Activity  . Alcohol use: Yes    Comment: weekly  . Drug use: No  . Sexual activity: Not on file  Other Topics Concern    . Not on file  Social History Narrative  . Not on file   Social Determinants of Health   Financial Resource Strain:   . Difficulty of Paying Living Expenses: Not on file  Food Insecurity:   . Worried About Charity fundraiser in the Last Year: Not on file  . Ran Out of Food in the Last Year: Not on file  Transportation Needs:   . Lack of Transportation (Medical): Not on file  . Lack of Transportation (Non-Medical): Not on file  Physical Activity:   . Days of Exercise per Week: Not on file  . Minutes of Exercise per Session: Not on file  Stress:   . Feeling of Stress : Not on file  Social Connections:   . Frequency of Communication with Friends and Family: Not on file  . Frequency of Social Gatherings with Friends and Family: Not on file  . Attends Religious Services: Not on file  . Active Member of Clubs or Organizations: Not on file  . Attends Archivist Meetings: Not on file  . Marital Status: Not on file  Intimate Partner Violence:   . Fear of Current or Ex-Partner: Not on file  . Emotionally Abused: Not on file  . Physically Abused: Not on file  . Sexually Abused:  Not on file    Past Surgical History:  Procedure Laterality Date  . TUBAL LIGATION      Family History  Problem Relation Age of Onset  . Sickle cell anemia Father     Allergies  Allergen Reactions  . Ace Inhibitors     Cough    Current Outpatient Medications on File Prior to Visit  Medication Sig Dispense Refill  . amLODipine (NORVASC) 10 MG tablet TAKE 1 TABLET BY MOUTH  DAILY 90 tablet 3  . atorvastatin (LIPITOR) 40 MG tablet TAKE 1 TABLET BY MOUTH  DAILY 90 tablet 3  . blood glucose meter kit and supplies Check blood sugar 1-2 times a day (FOR ICD-E11.9). 1 each 0  . diphenhydramine-acetaminophen (TYLENOL PM) 25-500 MG TABS Take 2 tablets by mouth every 4 (four) hours as needed (pain).    . Dulaglutide (TRULICITY) 5.88 FO/2.7XA SOPN INJECT THE CONTENTS OF 1  PEN SUBCUTANEOUSLY ONCE   WEEKLY 6 mL 3  . hydrochlorothiazide (MICROZIDE) 12.5 MG capsule TAKE 1 CAPSULE BY MOUTH  DAILY 90 capsule 3  . Lancets (ONETOUCH DELICA PLUS [id number]) MISC USE TO TEST 1-2 TIMES DAILY 100 each 0  . losartan (COZAAR) 25 MG tablet TAKE 1 TABLET BY MOUTH  DAILY 90 tablet 3  . metoprolol succinate (TOPROL-XL) 50 MG 24 hr tablet TAKE 1 TABLET BY MOUTH  DAILY WITH OR IMMEDIATELY  FOLLOWING A MEAL 90 tablet 3  . omeprazole (PRILOSEC) 20 MG capsule TAKE 1 CAPSULE BY MOUTH  DAILY 90 capsule 3  . ONETOUCH VERIO test strip Check blood sugar 1-2 times daily 100 each 12  . oxybutynin (DITROPAN XL) 5 MG 24 hr tablet Take 1 tablet (5 mg total) by mouth at bedtime. 30 tablet 0  . potassium chloride SA (K-DUR,KLOR-CON) 20 MEQ tablet Take 1 tablet (20 mEq total) by mouth daily. 5 tablet 0  . dextromethorphan-guaiFENesin (MUCINEX DM) 30-600 MG 12hr tablet Take 1 tablet by mouth 2 (two) times daily. (Patient not taking: Reported on 01/19/2020) 14 tablet 1  . methylPREDNISolone (MEDROL) 4 MG tablet 4 tab po day 1, 3 tab po day 2, 2 tab po day 3 and 1 tab po day 4. (Patient not taking: Reported on 02/16/2020) 10 tablet 0  . PRESCRIPTION MEDICATION Place 1 drop into both eyes daily. For dry eye and allergies. (Patient not taking: Reported on 01/19/2020)     No current facility-administered medications on file prior to visit.    BP 128/80   Pulse 66   Resp 18   Ht '5\' 4"'  (1.626 m)   Wt 171 lb (77.6 kg)   SpO2 97%   BMI 29.35 kg/m       Objective:   Physical Exam  General Mental Status- Alert. General Appearance- Not in acute distress.   Skin General: Color- Normal Color. Moisture- Normal Moisture.  Neck Carotid Arteries- Normal color. Moisture- Normal Moisture. No carotid bruits. No JVD.  Chest and Lung Exam Auscultation: Breath Sounds:-Normal.  Cardiovascular Auscultation:Rythm- Regular. Murmurs & Other Heart Sounds:Auscultation of the heart reveals- No  Murmurs.  Abdomen Inspection:-Inspeection Normal. Palpation/Percussion:Note:No mass. Palpation and Percussion of the abdomen reveal- Non Tender, Non Distended + BS, no rebound or guarding.   Neurologic Cranial Nerve exam:- CN III-XII intact(No nystagmus), symmetric smile. Strength:- 5/5 equal and symmetric strength both upper and lower extremities.  Feet- hallux valgus on both feet. Top of feet base of great toe mild callous. On bottom 1st metarsal head base of toe both moderate sized hard  callous. All nails thick disolored mild disfigured. Decreased monfilament test bottom of feet base of great toes. Pulses intact.      Assessment & Plan:  Patient does have some hallux valgus, diabetic neuropathy and calluses on exam today. She has diabetes and I did fill out her diabetic foot shoe form today. We'll need to see if supervising MD willing to sign form as this is required.  For history of low B12, check B12 a day and see if recent supplementation get her into normal range.  For diabetes, continue current dose of Trulicity but decrease sugar in diet as discussed. Repeat A1c in 3 months from last check. If A1c above 7 then will advise dose increase of Trulicity.  Hypertension history. Blood pressure well controlled today. Continue amlodipine, Cozaar and metoprolol.  For hyperlipidemia, check lipid panel today and adjust current statin if needed.  I decreased GFR in the past. We'll get metabolic panel with GFR today and if still low then will refer back to nephrologist. She missed referral last year due to Covid pandemic.  Follow-up in approximately 3 months or as needed.  Mackie Pai, PA-C

## 2020-04-27 NOTE — Telephone Encounter (Signed)
Caller states she has an appt tomorrow but is needing to know if she needs to have labs done. Should she be fasting.  Telephone: [phone]

## 2020-04-27 NOTE — Patient Instructions (Signed)
Patient does have some hallux valgus, diabetic neuropathy and calluses on exam today. She has diabetes and I did fill out her diabetic foot shoe form today. We'll need to see if supervising MD willing to sign form as this is required.  For history of low B12, check B12 a day and see if recent supplementation get her into normal range.  For diabetes, continue current dose of Trulicity but decrease sugar in diet as discussed. Repeat A1c in 3 months from last check. If A1c above 7 then will advise dose increase of Trulicity.  Hypertension history. Blood pressure well controlled today. Continue amlodipine, Cozaar and metoprolol.  For hyperlipidemia, check lipid panel today and adjust current statin if needed.  I decreased GFR in the past. We'll get metabolic panel with GFR today and if still low then will refer back to nephrologist. She missed referral last year due to Covid pandemic.  Follow-up in approximately 3 months or as needed.

## 2020-04-28 LAB — COMPLETE METABOLIC PANEL WITH GFR
AG Ratio: 1.8 (calc) (ref 1.0–2.5)
ALT: 8 U/L (ref 6–29)
AST: 15 U/L (ref 10–35)
Albumin: 4.7 g/dL (ref 3.6–5.1)
Alkaline phosphatase (APISO): 88 U/L (ref 37–153)
BUN/Creatinine Ratio: 20 (calc) (ref 6–22)
BUN: 29 mg/dL — ABNORMAL HIGH (ref 7–25)
CO2: 25 mmol/L (ref 20–32)
Calcium: 10.5 mg/dL — ABNORMAL HIGH (ref 8.6–10.4)
Chloride: 105 mmol/L (ref 98–110)
Creat: 1.47 mg/dL — ABNORMAL HIGH (ref 0.50–0.99)
GFR, Est African American: 42 mL/min/{1.73_m2} — ABNORMAL LOW (ref >=60)
GFR, Est Non African American: 37 mL/min/{1.73_m2} — ABNORMAL LOW (ref >=60)
Globulin: 2.6 g/dL (calc) (ref 1.9–3.7)
Glucose, Bld: 89 mg/dL (ref 65–99)
Potassium: 4.4 mmol/L (ref 3.5–5.3)
Sodium: 143 mmol/L (ref 135–146)
Total Bilirubin: 0.6 mg/dL (ref 0.2–1.2)
Total Protein: 7.3 g/dL (ref 6.1–8.1)

## 2020-04-28 LAB — LIPID PANEL
Cholesterol: 142 mg/dL (ref <200)
HDL: 39 mg/dL — ABNORMAL LOW (ref >=50)
LDL Cholesterol (Calc): 84 mg/dL (calc)
Non-HDL Cholesterol (Calc): 103 mg/dL (calc) (ref <130)
Total CHOL/HDL Ratio: 3.6 (calc) (ref <5.0)
Triglycerides: 91 mg/dL (ref <150)

## 2020-04-28 LAB — VITAMIN B12: Vitamin B-12: 246 pg/mL (ref 200–1100)

## 2020-05-06 MED FILL — AMLODIPINE BESYLATE 10 MG T: 10 | 7 days supply | Qty: 7 | Fill #1

## 2020-05-06 MED FILL — OMEPRAZOLE 20 MG CAP: 20 | 7 days supply | Qty: 7 | Fill #1

## 2020-05-06 MED FILL — HYDROCHLOROTHIAZIDE 12.5 MG: 12.5 | 7 days supply | Qty: 7 | Fill #1

## 2020-05-06 MED FILL — LOSARTAN POTASSIUM 25 MG TA: 25 | 7 days supply | Qty: 7 | Fill #1

## 2020-05-06 MED FILL — METOPROLOL SUCCINATE ER 50: 50 | 7 days supply | Qty: 7 | Fill #1

## 2020-05-06 MED FILL — ATORVASTATIN CALCIUM 40 MG: 40 | 7 days supply | Qty: 7 | Fill #1

## 2020-05-14 ENCOUNTER — Telehealth: Payer: Self-pay | Admitting: Medical

## 2020-05-14 NOTE — Telephone Encounter (Signed)
Will you coordinate with pt and her podiatrist office. I need opinion from podiatrist office as to what presrciption shoe they would recommend.  This is specialty issue. Dr. Abner Greenspan will sign the form if we can get information on the prescription.  You or pt can call specialist office.

## 2020-05-16 NOTE — Telephone Encounter (Signed)
I don't know where those are but did review. I did my own foot  exam but they records reviewed does not  give answer as to what prescription shoes she needs. Need direct advise from podiatrist. So you can talk to office or pt can ask them directly to check approtitate she rx.  Dr Abner Greenspan won't sign paperwork until we know the shoe prescription type.

## 2020-05-16 NOTE — Telephone Encounter (Signed)
Office notes from her podiatrist were given to you on her last OV , do you still have these notes

## 2020-05-16 NOTE — Telephone Encounter (Signed)
Pt called and unable to lvm 

## 2020-05-17 NOTE — Telephone Encounter (Signed)
Called Kevin Henry's office and asked for last OV and shoe prescription type  [phone] ... waiting for paperwork to be faxed over.

## 2020-08-18 ENCOUNTER — Encounter: Payer: Self-pay | Admitting: Medical

## 2020-08-18 ENCOUNTER — Ambulatory Visit (HOSPITAL_BASED_OUTPATIENT_CLINIC_OR_DEPARTMENT_OTHER)
Admission: RE | Admit: 2020-08-18 | Discharge: 2020-08-18 | Disposition: A | Discharge status: Home / Self Care | Payer: Medicare Other | Source: Ambulatory Visit | Attending: Medical | Admitting: Medical

## 2020-08-18 ENCOUNTER — Other Ambulatory Visit (HOSPITAL_BASED_OUTPATIENT_CLINIC_OR_DEPARTMENT_OTHER): Payer: Self-pay | Admitting: Medical

## 2020-08-18 ENCOUNTER — Ambulatory Visit (INDEPENDENT_AMBULATORY_CARE_PROVIDER_SITE_OTHER): Payer: Medicare Other | Admitting: Medical

## 2020-08-18 ENCOUNTER — Other Ambulatory Visit: Payer: Self-pay

## 2020-08-18 VITALS — BP 140/70 | HR 62 | Resp 20 | Ht 64.0 in | Wt 174.4 lb

## 2020-08-18 DIAGNOSIS — N183 Chronic kidney disease, stage 3 unspecified: Secondary | ICD-10-CM

## 2020-08-18 DIAGNOSIS — E786 Lipoprotein deficiency: Secondary | ICD-10-CM

## 2020-08-18 DIAGNOSIS — L989 Disorder of the skin and subcutaneous tissue, unspecified: Secondary | ICD-10-CM

## 2020-08-18 DIAGNOSIS — M542 Cervicalgia: Secondary | ICD-10-CM

## 2020-08-18 DIAGNOSIS — R202 Paresthesia of skin: Secondary | ICD-10-CM

## 2020-08-18 DIAGNOSIS — E1140 Type 2 diabetes mellitus with diabetic neuropathy, unspecified: Secondary | ICD-10-CM

## 2020-08-18 DIAGNOSIS — E785 Hyperlipidemia, unspecified: Secondary | ICD-10-CM

## 2020-08-18 DIAGNOSIS — I10 Essential (primary) hypertension: Secondary | ICD-10-CM | POA: Diagnosis not present

## 2020-08-18 DIAGNOSIS — Z1231 Encounter for screening mammogram for malignant neoplasm of breast: Secondary | ICD-10-CM

## 2020-08-18 DIAGNOSIS — E538 Deficiency of other specified B group vitamins: Secondary | ICD-10-CM

## 2020-08-18 LAB — COMPREHENSIVE METABOLIC PANEL
ALT: 9 U/L (ref 0–35)
AST: 14 U/L (ref 0–37)
Albumin: 4.3 g/dL (ref 3.5–5.2)
Alkaline Phosphatase: 79 U/L (ref 39–117)
BUN: 20 mg/dL (ref 6–23)
CO2: 31 mEq/L (ref 19–32)
Calcium: 10.1 mg/dL (ref 8.4–10.5)
Chloride: 104 mEq/L (ref 96–112)
Creatinine, Ser: 1.8 mg/dL — ABNORMAL HIGH (ref 0.40–1.20)
GFR: 28.69 mL/min — ABNORMAL LOW (ref >60.00)
Glucose, Bld: 110 mg/dL — ABNORMAL HIGH (ref 70–99)
Potassium: 4.1 mEq/L (ref 3.5–5.1)
Sodium: 140 mEq/L (ref 135–145)
Total Bilirubin: 0.6 mg/dL (ref 0.2–1.2)
Total Protein: 7.1 g/dL (ref 6.0–8.3)

## 2020-08-18 LAB — HEMOGLOBIN A1C: Hgb A1c MFr Bld: 5.9 % (ref 4.6–6.5)

## 2020-08-18 LAB — VITAMIN B12: Vitamin B-12: 159 pg/mL — ABNORMAL LOW (ref 211–911)

## 2020-08-18 NOTE — Telephone Encounter (Signed)
Per patient [employer] sold [employer] company to [employer]. [employer] phone number is [phone]. Dr. Sherilyn Cooter will send paperwork back to [employer] with the new date"

## 2020-08-18 NOTE — Patient Instructions (Addendum)
For history of low B12, check B12 a day and see if recent supplementation get her into normal range.  For diabetes, continue current dose of Trulicity but decrease sugar in diet as discussed.. If A1c above 7 then will advise dose increase of Trulicity.  Hypertension history. Blood pressure moderate well controlled today. Continue amlodipine, Cozaar and metoprolol.  High cholesterol. Last check reasonable control with atorvastatin. No check today since you ate bacon this am/blt.   For skin lesion rt side abd referred to dermatologist.  For neck pain get c spine xray.  For tingling index fingers at night will follow neck xray. See if more c7 narrowing. Then may refer to sport med to see if thinks cts vs neck origin of symptoms.   Follow up in 3 months or as needed

## 2020-08-18 NOTE — Progress Notes (Signed)
Subjective:    Patient ID: Olivia Morales, female    DOB: [birth year], 70 y.o.   MRN: [id number]  HPI  Pt in for follow up.  Pt is trying to get diabetic shoes. She is having Dr. Mallie Mussel podiatrist office will now fax it to [place]. Prior company closed and now [employer].  We had filled out paperwork in September.  Pt has hx of htn. She is on medication/took this am.  Pt has diabetes and this morning sugar was 110.   Hx of low b12. 3 month got IM injection. Pt is on b12 otc now.  1 month of has bilateral tingling to both index fingers with tingling. She has some neck pain.   Pt has various moles on side of neck.  Also 2 weeks ago rt upper quadrant new skin lesion that is growing.  Review of Systems  Constitutional: Negative for chills, fatigue and fever.  Respiratory: Negative for cough, chest tightness, shortness of breath and wheezing.   Cardiovascular: Negative for chest pain and palpitations.  Gastrointestinal: Negative for abdominal pain.  Genitourinary: Negative for dyspareunia, dysuria, hematuria and urgency.  Musculoskeletal: Negative for back pain and gait problem.  Skin: Negative for rash.  Neurological: Negative for dizziness, speech difficulty, weakness, numbness and headaches.  Hematological: Negative for adenopathy. Does not bruise/bleed easily.  Psychiatric/Behavioral: Negative for behavioral problems, confusion and sleep disturbance. The patient is not nervous/anxious.    Past Medical History:  Diagnosis Date  . Diabetes mellitus   . Hypercholesteremia   . Hypertension   . Sleep apnea      Social History   Socioeconomic History  . Marital status: Widowed    Spouse name: Not on file  . Number of children: Not on file  . Years of education: Not on file  . Highest education level: Not on file  Occupational History  . Not on file  Tobacco Use  . Smoking status: Never Smoker  . Smokeless tobacco: Never Used  Vaping Use  . Vaping Use: Never used   Substance and Sexual Activity  . Alcohol use: Yes    Comment: weekly  . Drug use: No  . Sexual activity: Not on file  Other Topics Concern  . Not on file  Social History Narrative  . Not on file   Social Determinants of Health   Financial Resource Strain: Not on file  Food Insecurity: Not on file  Transportation Needs: Not on file  Physical Activity: Not on file  Stress: Not on file  Social Connections: Not on file  Intimate Partner Violence: Not on file    Past Surgical History:  Procedure Laterality Date  . TUBAL LIGATION      Family History  Problem Relation Age of Onset  . Sickle cell anemia Father     Allergies  Allergen Reactions  . Ace Inhibitors     Cough    Current Outpatient Medications on File Prior to Visit  Medication Sig Dispense Refill  . amLODipine (NORVASC) 10 MG tablet TAKE 1 TABLET BY MOUTH  DAILY 90 tablet 3  . atorvastatin (LIPITOR) 40 MG tablet TAKE 1 TABLET BY MOUTH  DAILY 90 tablet 3  . blood glucose meter kit and supplies Check blood sugar 1-2 times a day (FOR ICD-E11.9). 1 each 0  . diphenhydramine-acetaminophen (TYLENOL PM) 25-500 MG TABS Take 2 tablets by mouth every 4 (four) hours as needed (pain).    . Dulaglutide (TRULICITY) 9.89 QJ/1.9ER SOPN INJECT THE CONTENTS OF 1  PEN  SUBCUTANEOUSLY ONCE  WEEKLY 6 mL 3  . hydrochlorothiazide (MICROZIDE) 12.5 MG capsule TAKE 1 CAPSULE BY MOUTH  DAILY 90 capsule 3  . Lancets (ONETOUCH DELICA PLUS [id number]) MISC USE TO TEST 1-2 TIMES DAILY 100 each 0  . losartan (COZAAR) 25 MG tablet TAKE 1 TABLET BY MOUTH  DAILY 90 tablet 3  . metoprolol succinate (TOPROL-XL) 50 MG 24 hr tablet TAKE 1 TABLET BY MOUTH  DAILY WITH OR IMMEDIATELY  FOLLOWING A MEAL 90 tablet 3  . omeprazole (PRILOSEC) 20 MG capsule TAKE 1 CAPSULE BY MOUTH  DAILY 90 capsule 3  . ONETOUCH VERIO test strip Check blood sugar 1-2 times daily 100 each 12  . dextromethorphan-guaiFENesin (MUCINEX DM) 30-600 MG 12hr tablet Take 1 tablet by  mouth 2 (two) times daily. (Patient not taking: No sig reported) 14 tablet 1  . methylPREDNISolone (MEDROL) 4 MG tablet 4 tab po day 1, 3 tab po day 2, 2 tab po day 3 and 1 tab po day 4. (Patient not taking: No sig reported) 10 tablet 0  . oxybutynin (DITROPAN XL) 5 MG 24 hr tablet Take 1 tablet (5 mg total) by mouth at bedtime. (Patient not taking: Reported on 08/18/2020) 30 tablet 0  . potassium chloride SA (K-DUR,KLOR-CON) 20 MEQ tablet Take 1 tablet (20 mEq total) by mouth daily. (Patient not taking: Reported on 08/18/2020) 5 tablet 0  . PRESCRIPTION MEDICATION Place 1 drop into both eyes daily. For dry eye and allergies. (Patient not taking: Reported on 08/18/2020)     No current facility-administered medications on file prior to visit.    BP (!) 163/61   Pulse 62   Resp 20   Ht _0  (1.626 m)   Wt 174 lb 6.4 oz (79.1 kg)   SpO2 100%   BMI 29.94 kg/m       Objective:   Physical Exam  General Mental Status- Alert. General Appearance- Not in acute distress.   Skin General: Color- Normal Color. Moisture- Normal Moisture.  Neck Carotid Arteries- Normal color. Moisture- Normal Moisture. No carotid bruits. No JVD.  Chest and Lung Exam Auscultation: Breath Sounds:-Normal.  Cardiovascular Auscultation:Rythm- Regular. Murmurs & Other Heart Sounds:Auscultation of the heart reveals- No Murmurs.  Abdomen Inspection:-Inspeection Normal. Palpation/Percussion:Note:No mass. Palpation and Percussion of the abdomen reveal- Non Tender, Non Distended + BS, no rebound or guarding.   Neurologic Cranial Nerve exam:- CN III-XII intact(No nystagmus), symmetric smile. Strength:- 5/5 equal and symmetric strength both upper and lower extremities.  Bilateral wrist- manipulation made index fingers tingle/slight numbness.     Assessment & Plan:  For history of low B12, check B12 a day and see if recent supplementation get her into normal range.  For diabetes, continue current dose of  Trulicity but decrease sugar in diet as discussed.If A1c above 7 then will advise dose increase of Trulicity.  Hypertension history. Blood pressure moderate well controlled today. Continue amlodipine, Cozaar and metoprolol.  High cholesterol. Last check reasonable control with atorvastatin. No check today since you ate bacon this am/blt.  For skin lesion rt side abd referred to dermatologist.  For neck pain get c spine xray.  For tingling index fingers at night will follow neck xray. See if more c7 narrowing. Then may refer to sport med to see if thinks cts vs neck origin of symptoms.  Follow up in 3 months or as needed  [employer], [employer]

## 2020-08-18 NOTE — Telephone Encounter (Signed)
Patient came into office and stated Olivia Morales's office had sent over paperwork to [employer] , but they will send paperwork over to our office today as well .  Olivia Morales's office stated [employer] should have called patient to get fitted for shoe, Still have form waiting on PA signature

## 2020-08-18 NOTE — Telephone Encounter (Signed)
New form requested from [facility], filled out by [name] and cosigned by Dr. Abner Greenspan. Form was faxed back to [facility].   Spoke to [place] at Dr. Caryn Bee Henry's office and they will also fax the prescription for the diabetic shoes today (per [place]).

## 2020-08-18 NOTE — Telephone Encounter (Signed)
Pt provided this number once she got off the phone: [phone].

## 2020-08-20 ENCOUNTER — Inpatient Hospital Stay (HOSPITAL_BASED_OUTPATIENT_CLINIC_OR_DEPARTMENT_OTHER): Admission: RE | Admit: 2020-08-20 | Payer: Medicare Other | Source: Ambulatory Visit

## 2020-08-20 NOTE — Addendum Note (Signed)
Addended by: Gwenevere Abbot on: 08/20/2020 07:18 PM   Modules accepted: Orders

## 2020-08-23 ENCOUNTER — Telehealth: Payer: Self-pay | Admitting: Medical

## 2020-08-23 ENCOUNTER — Other Ambulatory Visit: Payer: Self-pay | Admitting: *Deleted

## 2020-08-23 DIAGNOSIS — N183 Chronic kidney disease, stage 3 unspecified: Secondary | ICD-10-CM

## 2020-08-23 NOTE — Telephone Encounter (Signed)
Patient is requesting a call back in reference to referral to [facility]. Patient is upset about their reschedule polices and want to go somewhere else.

## 2020-08-23 NOTE — Telephone Encounter (Signed)
See notes. Pt wants to be referred to different dermatologist.

## 2020-08-23 NOTE — Telephone Encounter (Signed)
Patient was upset about the $25 copay , and wanted to reschedule and then was upset about the $100 rescheduling/cancelation fee , patient states she doesn't have the money.

## 2020-08-25 ENCOUNTER — Ambulatory Visit: Payer: Medicare Other | Admitting: Family Medicine

## 2020-08-25 NOTE — Progress Notes (Deleted)
  Olivia Morales - 70 y.o. female MRN [id number]  Date of birth: 02-Mar-1951  SUBJECTIVE:  Including CC & ROS.  No chief complaint on file.   Olivia Morales is a 70 y.o. female that is  ***.  ***   Review of Systems See HPI   HISTORY: Past Medical, Surgical, Social, and Family History Reviewed & Updated per EMR.   Pertinent Historical Findings include:  Past Medical History:  Diagnosis Date  . Diabetes mellitus   . Hypercholesteremia   . Hypertension   . Sleep apnea     Past Surgical History:  Procedure Laterality Date  . TUBAL LIGATION      Family History  Problem Relation Age of Onset  . Sickle cell anemia Father     Social History   Socioeconomic History  . Marital status: Widowed    Spouse name: Not on file  . Number of children: Not on file  . Years of education: Not on file  . Highest education level: Not on file  Occupational History  . Not on file  Tobacco Use  . Smoking status: Never Smoker  . Smokeless tobacco: Never Used  Vaping Use  . Vaping Use: Never used  Substance and Sexual Activity  . Alcohol use: Yes    Comment: weekly  . Drug use: No  . Sexual activity: Not on file  Other Topics Concern  . Not on file  Social History Narrative  . Not on file   Social Determinants of Health   Financial Resource Strain: Not on file  Food Insecurity: Not on file  Transportation Needs: Not on file  Physical Activity: Not on file  Stress: Not on file  Social Connections: Not on file  Intimate Partner Violence: Not on file     PHYSICAL EXAM:  VS: There were no vitals taken for this visit. Physical Exam Gen: NAD, alert, cooperative with exam, well-appearing MSK:  ***      ASSESSMENT & PLAN:   No problem-specific Assessment & Plan notes found for this encounter.

## 2020-08-29 ENCOUNTER — Telehealth: Payer: Self-pay | Admitting: Medical

## 2020-08-29 NOTE — Telephone Encounter (Signed)
Patient would like to know what dosage of B12 she should buy

## 2020-08-29 NOTE — Telephone Encounter (Signed)
Pt.notified

## 2020-08-30 ENCOUNTER — Other Ambulatory Visit: Payer: Self-pay

## 2020-08-30 ENCOUNTER — Ambulatory Visit (INDEPENDENT_AMBULATORY_CARE_PROVIDER_SITE_OTHER): Payer: Medicare Other | Admitting: *Deleted

## 2020-08-30 DIAGNOSIS — E538 Deficiency of other specified B group vitamins: Secondary | ICD-10-CM | POA: Diagnosis not present

## 2020-08-30 MED ORDER — CYANOCOBALAMIN 1000 MCG/ML IJ SOLN
1000.0000 ug | Freq: Once | INTRAMUSCULAR | Status: AC
  Administered 2020-08-30: 1000 ug via INTRAMUSCULAR

## 2020-08-30 NOTE — Progress Notes (Addendum)
Olivia Morales is a 70 y.o. female presents to the office today for b12 injection, per physician's orders.  Original order: b12 injection weekly for 4 weeks followed by monthly b12 for 5 months.  b12, 1,065mcg, IM was administered in right deltoid today. Patient tolerated injection.  Patient due for follow up labs/provider appt: Yes. Date due: 09/06/20, appt made Yes Patient next injection due: 09/06/20, appt made Yes  Thelma Barge Davette  Esperanza Richters, PA-C

## 2020-09-06 ENCOUNTER — Ambulatory Visit (INDEPENDENT_AMBULATORY_CARE_PROVIDER_SITE_OTHER): Payer: Medicare Other

## 2020-09-06 ENCOUNTER — Other Ambulatory Visit: Payer: Self-pay

## 2020-09-06 DIAGNOSIS — E538 Deficiency of other specified B group vitamins: Secondary | ICD-10-CM

## 2020-09-06 MED ORDER — CYANOCOBALAMIN 1000 MCG/ML IJ SOLN
1000.0000 ug | Freq: Once | INTRAMUSCULAR | Status: AC
  Administered 2020-09-06: 1000 ug via INTRAMUSCULAR

## 2020-09-06 NOTE — Progress Notes (Addendum)
Olivia Morales is a 70 y.o. female presents to the office today for B12 injection, per physician's orders. Original order: 02/16/20  Vitamin B-12, 1,039mcg, IM was administered left deltoid today. Patient tolerated injection. Patient due for follow up labs/provider appt: No. Date due: unknown, appt made No Patient next injection due:09/13/20, appt made Yes  Shaakira A Chism  Esperanza Richters, PA-C

## 2020-09-12 ENCOUNTER — Telehealth: Payer: Self-pay | Admitting: Medical

## 2020-09-12 NOTE — Telephone Encounter (Signed)
Olivia Morales from [facility] would like  Last visit notes showing patient is being treated for diabetes

## 2020-09-13 ENCOUNTER — Ambulatory Visit (INDEPENDENT_AMBULATORY_CARE_PROVIDER_SITE_OTHER): Payer: Medicare Other

## 2020-09-13 ENCOUNTER — Other Ambulatory Visit: Payer: Self-pay

## 2020-09-13 DIAGNOSIS — E538 Deficiency of other specified B group vitamins: Secondary | ICD-10-CM | POA: Diagnosis not present

## 2020-09-13 MED ORDER — CYANOCOBALAMIN 1000 MCG/ML IJ SOLN
1000.0000 ug | Freq: Once | INTRAMUSCULAR | Status: AC
Start: 2020-09-13 — End: 2020-09-13
  Administered 2020-09-13: 1000 ug via INTRAMUSCULAR

## 2020-09-13 NOTE — Telephone Encounter (Signed)
Spoke with Aurea Graff at [facility] [phone]  Patient needs a new script for diabetic shoes , needs to say offshelf shoes instead of custom mold -- office will be sending over paperwork today.

## 2020-09-13 NOTE — Progress Notes (Addendum)
Olivia Morales is a 70 y.o. female presents to the office today for forth  Weekly B12 injection, per physician's orders. Original order: 08/18/2020 Esperanza Richters B12 1000 mcg administered in left deltoid today. Patient tolerated injection. Patient next injection due: in 1 week, appt made yes  [place]ft, Feliberto Harts, RMA  [employer], PA-C

## 2020-09-20 ENCOUNTER — Telehealth: Payer: Self-pay | Admitting: Medical

## 2020-09-20 ENCOUNTER — Ambulatory Visit (INDEPENDENT_AMBULATORY_CARE_PROVIDER_SITE_OTHER): Payer: Medicare Other

## 2020-09-20 ENCOUNTER — Encounter (HOSPITAL_BASED_OUTPATIENT_CLINIC_OR_DEPARTMENT_OTHER): Payer: Self-pay

## 2020-09-20 ENCOUNTER — Ambulatory Visit (HOSPITAL_BASED_OUTPATIENT_CLINIC_OR_DEPARTMENT_OTHER)
Admission: RE | Admit: 2020-09-20 | Discharge: 2020-09-20 | Disposition: A | Discharge status: Home / Self Care | Payer: Medicare Other | Source: Ambulatory Visit | Attending: Medical | Admitting: Medical

## 2020-09-20 ENCOUNTER — Other Ambulatory Visit: Payer: Self-pay

## 2020-09-20 DIAGNOSIS — Z1231 Encounter for screening mammogram for malignant neoplasm of breast: Secondary | ICD-10-CM | POA: Insufficient documentation

## 2020-09-20 DIAGNOSIS — E538 Deficiency of other specified B group vitamins: Secondary | ICD-10-CM | POA: Diagnosis not present

## 2020-09-20 MED ORDER — CYANOCOBALAMIN 1000 MCG/ML IJ SOLN
1000.0000 ug | Freq: Once | INTRAMUSCULAR | Status: AC
  Administered 2020-09-20: 1000 ug via INTRAMUSCULAR

## 2020-09-20 NOTE — Telephone Encounter (Signed)
I have new paperwork to fill out diabetic shoe  forms. You helped me about one month ago. Thanks.  Then was the second time form was filled out due to company that she was using closing/selling out to new pharmacy. 1st time filled out was in September.  Did we ever get Dr .Sherilyn Cooter last office note. Dr .Abner Greenspan signed paperwork.It looks like Dr. Sherilyn Cooter office was going to send over prescription for shoe? sier  Now just saw new paperwork late last week.  So this would be 3rd time filling out paperwork.  Will you call pt to get status of this. Sometimes we get duplicate paperwork?? See if she ever got shoe?  Let me know what she says.  If I have to start over from scratch let me know.  Thanks,  Esperanza Richters, PA-C

## 2020-09-20 NOTE — Telephone Encounter (Signed)
Ok thanks for the update 

## 2020-09-20 NOTE — Telephone Encounter (Signed)
Per patient she was fitted for the shoes on 09-12-20 and will receive her shoes in 3 more weeks.  No need to re-do paper work.

## 2020-09-20 NOTE — Telephone Encounter (Signed)
Opened to review for diabetic shoe. 3 time filling out/receiving diabetic shoe paperwork.

## 2020-09-20 NOTE — Progress Notes (Addendum)
Olivia Morales is a 70 y.o. female presents to the office today for fith Weekly B12 injection, per physician's orders. Original order: 08/18/2020 Esperanza Richters B12 1000 mcg administered in left deltoid today. Patient tolerated injection. Patient next injection due: in 1 month, appt made yes   [place], Feliberto Harts, RMA    [employer], PA-C

## 2020-10-04 NOTE — Telephone Encounter (Signed)
I called the [facility] who is going to provide her with the shoes and they clarified what they needed from [place]. Paperwork was faxed again to Joann at [facility] with correction and asked for them to call her as soon as they received the gax. I called the patient back this afternoon to let her know, she reports they called her this morning she tried to explain why she was so upset but did not apologized.

## 2020-10-04 NOTE — Telephone Encounter (Signed)
Patient is extremely upset (cursing and screaming). Patient was extremely rude and disrespectful. I explained I was just trying to obtain as much information to help CMA. Very rudely she screamed at me saying which part don't you get.   She proceeds to scream stating she was in pain.  Patient states there is a deadline for paperwork to be turned in. She doesn't understand why she has  always had to call us and curse [place] out in order for [place] to do our job.   Patient wants to speak to someone in regards to this matter.

## 2020-10-18 ENCOUNTER — Encounter (HOSPITAL_BASED_OUTPATIENT_CLINIC_OR_DEPARTMENT_OTHER): Payer: Self-pay | Admitting: *Deleted

## 2020-10-18 ENCOUNTER — Emergency Department (HOSPITAL_BASED_OUTPATIENT_CLINIC_OR_DEPARTMENT_OTHER): Payer: Medicare Other

## 2020-10-18 ENCOUNTER — Emergency Department (HOSPITAL_BASED_OUTPATIENT_CLINIC_OR_DEPARTMENT_OTHER)
Admission: EM | Admit: 2020-10-18 | Discharge: 2020-10-18 | Disposition: A | Discharge status: Home / Self Care | Payer: Medicare Other | Attending: Emergency Medicine | Admitting: Emergency Medicine

## 2020-10-18 ENCOUNTER — Other Ambulatory Visit: Payer: Self-pay

## 2020-10-18 ENCOUNTER — Ambulatory Visit: Payer: Self-pay | Admitting: *Deleted

## 2020-10-18 DIAGNOSIS — R197 Diarrhea, unspecified: Secondary | ICD-10-CM | POA: Diagnosis not present

## 2020-10-18 DIAGNOSIS — Z20822 Contact with and (suspected) exposure to covid-19: Secondary | ICD-10-CM | POA: Insufficient documentation

## 2020-10-18 DIAGNOSIS — R42 Dizziness and giddiness: Secondary | ICD-10-CM | POA: Diagnosis not present

## 2020-10-18 DIAGNOSIS — R112 Nausea with vomiting, unspecified: Secondary | ICD-10-CM | POA: Diagnosis not present

## 2020-10-18 DIAGNOSIS — I129 Hypertensive chronic kidney disease with stage 1 through stage 4 chronic kidney disease, or unspecified chronic kidney disease: Secondary | ICD-10-CM | POA: Insufficient documentation

## 2020-10-18 DIAGNOSIS — Z79899 Other long term (current) drug therapy: Secondary | ICD-10-CM | POA: Diagnosis not present

## 2020-10-18 DIAGNOSIS — R109 Unspecified abdominal pain: Secondary | ICD-10-CM | POA: Diagnosis not present

## 2020-10-18 DIAGNOSIS — M889 Osteitis deformans of unspecified bone: Secondary | ICD-10-CM

## 2020-10-18 DIAGNOSIS — R001 Bradycardia, unspecified: Secondary | ICD-10-CM | POA: Diagnosis not present

## 2020-10-18 DIAGNOSIS — N183 Chronic kidney disease, stage 3 unspecified: Secondary | ICD-10-CM | POA: Diagnosis not present

## 2020-10-18 DIAGNOSIS — E1122 Type 2 diabetes mellitus with diabetic chronic kidney disease: Secondary | ICD-10-CM | POA: Diagnosis not present

## 2020-10-18 DIAGNOSIS — R531 Weakness: Secondary | ICD-10-CM | POA: Insufficient documentation

## 2020-10-18 LAB — RESP PANEL BY RT-PCR (FLU A&B, COVID) ARPGX2
Influenza A by PCR: NEGATIVE
Influenza B by PCR: NEGATIVE
SARS Coronavirus 2 by RT PCR: NEGATIVE

## 2020-10-18 LAB — URINALYSIS, MICROSCOPIC (REFLEX): RBC / HPF: NONE SEEN RBC/hpf (ref 0–5)

## 2020-10-18 LAB — CBC
HCT: 32 % — ABNORMAL LOW (ref 36.0–46.0)
Hemoglobin: 10.6 g/dL — ABNORMAL LOW (ref 12.0–15.0)
MCH: 27.5 pg (ref 26.0–34.0)
MCHC: 33.1 g/dL (ref 30.0–36.0)
MCV: 83.1 fL (ref 80.0–100.0)
Platelets: 319 10*3/uL (ref 150–400)
RBC: 3.85 MIL/uL — ABNORMAL LOW (ref 3.87–5.11)
RDW: 12.8 % (ref 11.5–15.5)
WBC: 3.5 10*3/uL — ABNORMAL LOW (ref 4.0–10.5)
nRBC: 0 % (ref 0.0–0.2)

## 2020-10-18 LAB — COMPREHENSIVE METABOLIC PANEL
ALT: 18 U/L (ref 0–44)
AST: 25 U/L (ref 15–41)
Albumin: 3.9 g/dL (ref 3.5–5.0)
Alkaline Phosphatase: 77 U/L (ref 38–126)
Anion gap: 11 (ref 5–15)
BUN: 20 mg/dL (ref 8–23)
CO2: 20 mmol/L — ABNORMAL LOW (ref 22–32)
Calcium: 9.7 mg/dL (ref 8.9–10.3)
Chloride: 105 mmol/L (ref 98–111)
Creatinine, Ser: 1.22 mg/dL — ABNORMAL HIGH (ref 0.44–1.00)
GFR, Estimated: 48 mL/min — ABNORMAL LOW (ref >60)
Glucose, Bld: 123 mg/dL — ABNORMAL HIGH (ref 70–99)
Potassium: 3.4 mmol/L — ABNORMAL LOW (ref 3.5–5.1)
Sodium: 136 mmol/L (ref 135–145)
Total Bilirubin: 0.1 mg/dL — ABNORMAL LOW (ref 0.3–1.2)
Total Protein: 7.4 g/dL (ref 6.5–8.1)

## 2020-10-18 LAB — URINALYSIS, ROUTINE W REFLEX MICROSCOPIC
Bilirubin Urine: NEGATIVE
Glucose, UA: NEGATIVE mg/dL
Hgb urine dipstick: NEGATIVE
Ketones, ur: 15 mg/dL — AB
Nitrite: NEGATIVE
Protein, ur: 100 mg/dL — AB
Specific Gravity, Urine: 1.015 (ref 1.005–1.030)
pH: 6 (ref 5.0–8.0)

## 2020-10-18 LAB — LIPASE, BLOOD: Lipase: 45 U/L (ref 11–51)

## 2020-10-18 MED ORDER — ONDANSETRON 4 MG PO TBDP
4.0000 mg | ORAL_TABLET | Freq: Three times a day (TID) | ORAL | 0 refills | Status: DC | PRN
  Filled 2020-10-21: qty 15, 5d supply, fill #0

## 2020-10-18 MED ORDER — ONDANSETRON HCL 4 MG/2ML IJ SOLN
4.0000 mg | Freq: Once | INTRAMUSCULAR | Status: AC
  Administered 2020-10-18: 4 mg via INTRAVENOUS
  Filled 2020-10-18: qty 2

## 2020-10-18 MED ORDER — IOHEXOL 300 MG/ML  SOLN
100.0000 mL | Freq: Once | INTRAMUSCULAR | Status: AC | PRN
  Administered 2020-10-18: 100 mL via INTRAVENOUS

## 2020-10-18 MED ORDER — DICYCLOMINE HCL 20 MG PO TABS
20.0000 mg | ORAL_TABLET | Freq: Two times a day (BID) | ORAL | 0 refills | Status: DC | PRN
  Filled 2020-10-21: qty 10, 5d supply, fill #0

## 2020-10-18 MED ORDER — SODIUM CHLORIDE 0.9 % IV BOLUS
1000.0000 mL | Freq: Once | INTRAVENOUS | Status: AC
  Administered 2020-10-18: 1000 mL via INTRAVENOUS

## 2020-10-18 NOTE — ED Provider Notes (Signed)
[place] EMERGENCY DEPARTMENT Provider Note   CSN: [id number] Arrival date & time: 10/18/20  1836     History Chief Complaint  Patient presents with  . Emesis  . Diarrhea    Olivia Morales is a 70 y.o. female presenting for evaluation of nausea, vomiting, diarrhea.  Patient states 1 week ago she had a day of nausea, vomiting, diarrhea.  Symptoms resolved until 3 days ago when he returned.  She reports many episodes diarrhea and vomiting over the past 3 days.  She is now feeling lightheaded and weak.  She reports abdominal cramping prior to having diarrhea, but no abdominal pain at other times.  She denies fevers, chills, chest pain, shortness of breath, cough.  She does not know if she is having any urinary symptoms or when she is urinating, as she is currently wearing a depends due to urgency of having a bowel movement.  She denies sick contacts.  She denies recent medication changes.  No previous history of abdominal problems.  She reports a history of hypertension, hyperlipidemia, diabetes.  She has not been checking her blood sugars over the past week. She denies any recent antibiotics use. No blood in her stool. She reports stool is now clear and mucousy.   HPI     Past Medical History:  Diagnosis Date  . Diabetes mellitus   . Hypercholesteremia   . Hypertension   . Sleep apnea     Patient Active Problem List   Diagnosis Date Noted  . Left ankle injury, subsequent encounter 12/19/2017  . Class 2 severe obesity due to excess calories with serious comorbidity and body mass index (BMI) of 35.0 to 35.9 in adult ([place]) 03/05/2017  . 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 Nocturia more than twice per night 02/08/2016  . Noncompliance with CPAP treatment 02/08/2016  . CKD (chronic kidney disease), stage III ([place]) 01/11/2014  . Hyperlipidemia associated with type 2 diabetes mellitus ([place]) 09/14/2013  . Hypertension associated with diabetes ([place]) 10/31/2011   . Diabetes mellitus ([place]) 10/31/2011    Past Surgical History:  Procedure Laterality Date  . TUBAL LIGATION       OB History   No obstetric history on file.     Family History  Problem Relation Age of Onset  . Sickle cell anemia Father     Social History   Tobacco Use  . Smoking status: Never Smoker  . Smokeless tobacco: Never Used  Vaping Use  . Vaping Use: Never used  Substance Use Topics  . Alcohol use: Yes    Comment: weekly  . Drug use: No    Home Medications Prior to Admission medications   Medication Sig Start Date End Date Taking? Authorizing Provider  dicyclomine (BENTYL) 20 MG tablet Take 1 tablet (20 mg total) by mouth 2 (two) times daily as needed for spasms. 10/18/20  Yes Bernhard Koskinen, PA-C  ondansetron (ZOFRAN ODT) 4 MG disintegrating tablet Take 1 tablet (4 mg total) by mouth every 8 (eight) hours as needed for nausea or vomiting. 10/18/20  Yes Anani Gu, PA-C  amLODipine (NORVASC) 10 MG tablet TAKE 1 TABLET BY MOUTH  DAILY 03/25/20   Saguier, Percell Miller, PA-C  atorvastatin (LIPITOR) 40 MG tablet TAKE 1 TABLET BY MOUTH  DAILY 03/25/20   Saguier, Percell Miller, PA-C  blood glucose meter kit and supplies Check blood sugar 1-2 times a day (FOR ICD-E11.9). 03/26/18   Saguier, Percell Miller, PA-C  dextromethorphan-guaiFENesin (MUCINEX DM) 30-600 MG 12hr tablet Take 1 tablet by  mouth 2 (two) times daily. Patient not taking: No sig reported 09/25/19   Fredia Sorrow, MD  diphenhydramine-acetaminophen (TYLENOL PM) 25-500 MG TABS Take 2 tablets by mouth every 4 (four) hours as needed (pain).    [provider]  Dulaglutide (TRULICITY) 0.35 KK/9.3GH SOPN INJECT THE CONTENTS OF 1  PEN SUBCUTANEOUSLY ONCE  WEEKLY 02/15/20   Saguier, Percell Miller, PA-C  hydrochlorothiazide (MICROZIDE) 12.5 MG capsule TAKE 1 CAPSULE BY MOUTH  DAILY 03/25/20   Saguier, Percell Miller, PA-C  Lancets [facility] DELICA PLUS [id number]) [place] USE TO TEST 1-2 TIMES DAILY 07/28/18   Saguier, Percell Miller, PA-C   losartan (COZAAR) 25 MG tablet TAKE 1 TABLET BY MOUTH  DAILY 03/25/20   Saguier, Percell Miller, PA-C  methylPREDNISolone (MEDROL) 4 MG tablet 4 tab po day 1, 3 tab po day 2, 2 tab po day 3 and 1 tab po day 4. Patient not taking: No sig reported 01/23/20   Saguier, Percell Miller, PA-C  metoprolol succinate (TOPROL-XL) 50 MG 24 hr tablet TAKE 1 TABLET BY MOUTH  DAILY WITH OR IMMEDIATELY  FOLLOWING A MEAL 03/25/20   Saguier, Percell Miller, PA-C  omeprazole (PRILOSEC) 20 MG capsule TAKE 1 CAPSULE BY MOUTH  DAILY 03/25/20   Saguier, Percell Miller, PA-C  [facility] VERIO test strip Check blood sugar 1-2 times daily 05/28/18   Saguier, Percell Miller, PA-C  oxybutynin (DITROPAN XL) 5 MG 24 hr tablet Take 1 tablet (5 mg total) by mouth at bedtime. Patient not taking: Reported on 08/18/2020 01/19/20   Saguier, Percell Miller, PA-C  potassium chloride SA (K-DUR,KLOR-CON) 20 MEQ tablet Take 1 tablet (20 mEq total) by mouth daily. Patient not taking: Reported on 08/18/2020 09/11/18   Saguier, Percell Miller, PA-C  PRESCRIPTION MEDICATION Place 1 drop into both eyes daily. For dry eye and allergies. Patient not taking: Reported on 08/18/2020    [provider]    Allergies    Ace inhibitors  Review of Systems   Review of Systems  Gastrointestinal: Positive for abdominal pain (pre bowel movement), diarrhea, nausea and vomiting.  All other systems reviewed and are negative.   Physical Exam Updated Vital Signs BP 129/76   Pulse (!) 58   Temp 98.8 F (37.1 C) (Oral)   Resp 16   Ht '5\' 4"'  (1.626 m)   Wt 80.4 kg   SpO2 99%   BMI 30.42 kg/m   Physical Exam Vitals and nursing note reviewed.  Constitutional:      General: She is not in acute distress.    Appearance: She is well-developed. She is obese.     Comments: Appears nontoxic  HENT:     Head: Normocephalic and atraumatic.  Eyes:     Extraocular Movements: Extraocular movements intact.     Conjunctiva/sclera: Conjunctivae normal.     Pupils: Pupils are equal, round, and reactive to  light.  Cardiovascular:     Rate and Rhythm: Bradycardia present. Rhythm irregular.     Pulses: Normal pulses.     Comments: Bradycardic and irregular Pulmonary:     Effort: Pulmonary effort is normal. No respiratory distress.     Breath sounds: Normal breath sounds. No wheezing.  Abdominal:     General: There is no distension.     Palpations: Abdomen is soft. There is no mass.     Tenderness: There is no abdominal tenderness. There is no guarding or rebound.     Comments: No tenderness palpation of the abdomen.  No rigidity, guarding, distention.  Negative rebound. No peritonitis   Musculoskeletal:  General: Normal range of motion.     Cervical back: Normal range of motion and neck supple.     Right lower leg: No edema.     Left lower leg: No edema.  Skin:    General: Skin is warm and dry.     Capillary Refill: Capillary refill takes less than 2 seconds.  Neurological:     Mental Status: She is alert and oriented to person, place, and time.     ED Results / Procedures / Treatments   Labs (all labs ordered are listed, but only abnormal results are displayed) Labs Reviewed  COMPREHENSIVE METABOLIC PANEL - Abnormal; Notable for the following components:      Result Value   Potassium 3.4 (*)    CO2 20 (*)    Glucose, Bld 123 (*)    Creatinine, Ser 1.22 (*)    Total Bilirubin 0.1 (*)    GFR, Estimated 48 (*)    All other components within normal limits  CBC - Abnormal; Notable for the following components:   WBC 3.5 (*)    RBC 3.85 (*)    Hemoglobin 10.6 (*)    HCT 32.0 (*)    All other components within normal limits  URINALYSIS, ROUTINE W REFLEX MICROSCOPIC - Abnormal; Notable for the following components:   Ketones, ur 15 (*)    Protein, ur 100 (*)    Leukocytes,Ua TRACE (*)    All other components within normal limits  URINALYSIS, MICROSCOPIC (REFLEX) - Abnormal; Notable for the following components:   Bacteria, UA RARE (*)    All other components within  normal limits  RESP PANEL BY RT-PCR (FLU A&B, COVID) [id number]  C DIFFICILE QUICK SCREEN W PCR REFLEX  GASTROINTESTINAL PANEL BY PCR, STOOL (REPLACES STOOL CULTURE)  LIPASE, BLOOD    EKG EKG Interpretation  Date/Time:  Tuesday October 18 2020 20:52:48 EDT Ventricular Rate:  61 PR Interval:  122 QRS Duration: 135 QT Interval:  450 QTC Calculation: 454 R Axis:   -78 Text Interpretation: Sinus rhythm RBBB and LAFB No significant change since last tracing Confirmed by Blanchie Dessert [phone]) on 10/18/2020 9:04:26 PM   Radiology CT ABDOMEN PELVIS W CONTRAST  Result Date: 10/18/2020 CLINICAL DATA:  Vomiting and diarrhea for 3 days. EXAM: CT ABDOMEN AND PELVIS WITH CONTRAST TECHNIQUE: Multidetector CT imaging of the abdomen and pelvis was performed using the standard protocol following bolus administration of intravenous contrast. CONTRAST:  123m OMNIPAQUE IOHEXOL 300 MG/ML  SOLN COMPARISON:  None. FINDINGS: Lower chest: The lung bases are clear of acute process. No pleural effusion or pulmonary lesions. The heart is normal in size. No pericardial effusion. Aortic and coronary artery calcifications are noted. The distal esophagus and aorta are unremarkable. Hepatobiliary: No hepatic lesions or intrahepatic biliary dilatation. The gallbladder is contracted. No common bile duct dilatation. Pancreas: No mass, inflammation or ductal dilatation. Spleen: Normal size.  No focal lesions. Adrenals/Urinary Tract: Adrenal glands and kidneys are unremarkable. Small renal cysts are noted. The bladder is unremarkable. Stomach/Bowel: The stomach, duodenum, small bowel and colon are unremarkable. No acute inflammatory changes, mass lesions or obstructive findings. The terminal ileum is normal. The appendix is normal. Scattered colonic diverticulosis. No findings for acute diverticulitis. Vascular/Lymphatic: Moderate atherosclerotic calcifications involving the aorta but no aneurysm or dissection. The branch vessels  are patent. The major venous structures are patent. No mesenteric or retroperitoneal mass or adenopathy. Small scattered lymph nodes are noted. Reproductive: Large calcified fibroid noted in the uterus. This measures  approximately the 3.3 cm. The ovaries are unremarkable. Other: No pelvic mass or adenopathy. No free pelvic fluid collections. No inguinal mass or adenopathy. No abdominal wall hernia or subcutaneous lesions. Musculoskeletal: No significant bony findings. Moderate degenerative changes are noted involving the pubic symphysis along with chondrocalcinosis. There is also diffuse mixed lytic and sclerotic process involving the right hemipelvis typical for Paget's disease. IMPRESSION: 1. No acute abdominal/pelvic findings, mass lesions or adenopathy. 2. Large calcified uterine fibroid. 3. Paget's disease involving the right hemipelvis. 4. Aortic atherosclerosis. Aortic Atherosclerosis (ICD10-I70.0). Electronically Signed   By: Marijo Sanes M.D.   On: 10/18/2020 20:47    Procedures Procedures   Medications Ordered in ED Medications  sodium chloride 0.9 % bolus 1,000 mL (0 mLs Intravenous Stopped 10/18/20 2049)  ondansetron (ZOFRAN) injection 4 mg (4 mg Intravenous Given 10/18/20 1934)  iohexol (OMNIPAQUE) 300 MG/ML solution 100 mL (100 mLs Intravenous Contrast Given 10/18/20 2024)    ED Course  I have reviewed the triage vital signs and the nursing notes.  Pertinent labs & imaging results that were available during my care of the patient were reviewed by me and considered in my medical decision making (see chart for details).    MDM Rules/Calculators/A&P                          Patient presented for evaluation of nausea, vomiting, diarrhea and intermittent abdominal cramping.  On exam, patient appears nontoxic.  She has no abdominal tenderness on my exam.  Of note, she is found to be bradycardic and irregular, she reports no previous history of cardiac problems.  Consider electrolyte  abnormality.  Less likely cardiac as patient is not having any chest pain or shortness of breath.  Will give fluids and symptomatic medications.  Will obtain labs and CT abdomen pelvis for further evaluation.  Labs interpreted by me, overall reassuring.  Kidney, liver, pancreatic function normal.  Electrolytes stable.  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  Urine without clear signs of infection, more consistent with dehydration.  Patient without urinary symptoms, will hold off on antibiotic treatment at this time.  Kidney function slightly improved from previous.    On reassessment, patient reports significant improvement of symptoms with Zofran and fluids.  Repeat EKG improved, no longer irregular bradycardia has improved.  Discussed findings with patient and plan for symptomatic treatment.  Discussed follow-up with primary care.  At this time, patient appears safe for discharge.  Return precautions given.  Patient states she understands and agrees to plan.  Final Clinical Impression(s) / ED Diagnoses Final diagnoses:  Nausea vomiting and diarrhea  Paget's bone disease    Rx / DC Orders ED Discharge Orders         Ordered    ondansetron (ZOFRAN ODT) 4 MG disintegrating tablet  Every 8 hours PRN        10/18/20 2136    dicyclomine (BENTYL) 20 MG tablet  2 times daily PRN        10/18/20 2136           Franchot Heidelberg, PA-C 10/18/20 2137    Blanchie Dessert, MD 10/18/20 2336

## 2020-10-18 NOTE — ED Notes (Signed)
Pt up to bedside commode

## 2020-10-18 NOTE — ED Notes (Signed)
Pt to call when she has a larger amount of stool. There is only a couple of drops of stool at this time

## 2020-10-18 NOTE — Discharge Instructions (Addendum)
Use Zofran as needed for nausea or vomiting. Use Tylenol ibuprofen as needed for mild to moderate pain.  Use Bentyl as needed for abdominal cramping or spasm. Make sure you are staying well-hydrated water. Follow-up closely with your primary care doctor for recheck of your symptoms. Return to the emergency room if you develop high fevers, persistent vomiting, severe worsening abdominal pain, or any new, worsening, or concerning symptoms.

## 2020-10-18 NOTE — ED Triage Notes (Addendum)
Vomiting and diarrhea x 3 days. Heart rate 50 noted. EKG ordered.

## 2020-10-18 NOTE — Telephone Encounter (Signed)
C/o worsening vomiting and diarrhea since Saturday 10/15/20. C/o V/D x 4 days and now feeling lightheaded, medicine taste in mouth after vomiting and unable to keep liquids down. Greater than 5 episodes of diarrhea today and watery. Vomiting x 3 today so far. Abdominal cramping severe prior to diarrheal episodes. Reports diarrhea looks like mucus. C/o chills and sweating prior to diarrheal episodes. Reports "I'm freezing". Patient unable to check temperature , no thermometer. Patient has become incontinent due to severe cramping and urgent diarrhea. Patient has tried to consume only liquids and soup but vomits or has diarrhea after eating or drinking. Patient reports she has called multiple times and has not received a call back from PCP. Encouraged patient to go to UC or ED at this time due to worsening symptoms and calling after 5 pm. Care advise given. Patient verbalized understanding of care advise and to go to UC or ED now for worsening symptoms.   Reason for Disposition . [1] SEVERE diarrhea (e.g., 7 or more times / day more than normal) AND [2] age > 60 years  Answer Assessment - Initial Assessment Questions 1. DIARRHEA SEVERITY: "How bad is the diarrhea?" "How many extra stools have you had in the past 24 hours than normal?"    - NO DIARRHEA (SCALE 0)   - MILD (SCALE 1-3): Few loose or mushy BMs; increase of 1-3 stools over normal daily number of stools; mild increase in ostomy output.   -  MODERATE (SCALE 4-7): Increase of 4-6 stools daily over normal; moderate increase in ostomy output. * SEVERE (SCALE 8-10; OR 'WORST POSSIBLE'): Increase of 7 or more stools daily over normal; moderate increase in ostomy output; incontinence.     Moderate to severe 2. ONSET: "When did the diarrhea begin?"      Saturday 10/15/20 3. BM CONSISTENCY: "How loose or watery is the diarrhea?"      Loose now becoming watery and "feels like I'm peeing" when she is having diarrhea episode 4. VOMITING: "Are you also  vomiting?" If Yes, ask: "How many times in the past 24 hours?"      Yes. 3 times today and emesis tastes like medicine  5. ABDOMINAL PAIN: "Are you having any abdominal pain?" If Yes, ask: "What does it feel like?" (e.g., crampy, dull, intermittent, constant)      Yes. Cramping prior to diarrhea  6. ABDOMINAL PAIN SEVERITY: If present, ask: "How bad is the pain?"  (e.g., Scale 1-10; mild, moderate, or severe)   - MILD (1-3): doesn't interfere with normal activities, abdomen soft and not tender to touch    - MODERATE (4-7): interferes with normal activities or awakens from sleep, tender to touch    - SEVERE (8-10): excruciating pain, doubled over, unable to do any normal activities       severe 7. ORAL INTAKE: If vomiting, "Have you been able to drink liquids?" "How much fluids have you had in the past 24 hours?"     Yes but I cant keep anything down . Vomiting soon after drinking 8. HYDRATION: "Any signs of dehydration?" (e.g., dry mouth [not just dry lips], too weak to stand, dizziness, new weight loss) "When did you last urinate?"     Dry mouth , only urinates with diarrhea episode. Feeling lightheaded, chills  9. EXPOSURE: "Have you traveled to a foreign country recently?" "Have you been exposed to anyone with diarrhea?" "Could you have eaten any food that was spoiled?"     na 10. ANTIBIOTIC USE: "Are  you taking antibiotics now or have you taken antibiotics in the past 2 months?"       no 11. OTHER SYMPTOMS: "Do you have any other symptoms?" (e.g., fever, blood in stool)       Chills, has not been able to check temperature 12. PREGNANCY: "Is there any chance you are pregnant?" "When was your last menstrual period?"       na  Protocols used: [facility]

## 2020-10-21 ENCOUNTER — Ambulatory Visit (INDEPENDENT_AMBULATORY_CARE_PROVIDER_SITE_OTHER): Payer: Medicare Other

## 2020-10-21 ENCOUNTER — Other Ambulatory Visit (HOSPITAL_BASED_OUTPATIENT_CLINIC_OR_DEPARTMENT_OTHER): Payer: Self-pay

## 2020-10-21 ENCOUNTER — Telehealth (HOSPITAL_COMMUNITY): Payer: Self-pay

## 2020-10-21 ENCOUNTER — Other Ambulatory Visit: Payer: Self-pay

## 2020-10-21 DIAGNOSIS — E538 Deficiency of other specified B group vitamins: Secondary | ICD-10-CM

## 2020-10-21 MED ORDER — CYANOCOBALAMIN 1000 MCG/ML IJ SOLN
1000.0000 ug | Freq: Once | INTRAMUSCULAR | Status: AC
  Administered 2020-10-21: 1000 ug via INTRAMUSCULAR

## 2020-10-21 NOTE — Progress Notes (Addendum)
B12 injection given in left deltoid. Pt tolerated well.   Next visit for B12 is already scheudled 11/22/20  Esperanza Richters, PA-C

## 2020-10-25 ENCOUNTER — Ambulatory Visit: Payer: Medicare Other

## 2020-10-27 ENCOUNTER — Ambulatory Visit: Payer: Medicare Other | Attending: Internal Medicine

## 2020-10-27 ENCOUNTER — Encounter: Payer: Self-pay | Admitting: Medical

## 2020-10-27 ENCOUNTER — Other Ambulatory Visit: Payer: Self-pay

## 2020-10-27 ENCOUNTER — Ambulatory Visit (INDEPENDENT_AMBULATORY_CARE_PROVIDER_SITE_OTHER): Payer: Medicare Other | Admitting: Medical

## 2020-10-27 VITALS — BP 160/80 | HR 60 | Resp 18 | Ht 64.0 in | Wt 176.0 lb

## 2020-10-27 DIAGNOSIS — I10 Essential (primary) hypertension: Secondary | ICD-10-CM

## 2020-10-27 DIAGNOSIS — N183 Chronic kidney disease, stage 3 unspecified: Secondary | ICD-10-CM

## 2020-10-27 DIAGNOSIS — Z23 Encounter for immunization: Secondary | ICD-10-CM

## 2020-10-27 DIAGNOSIS — D649 Anemia, unspecified: Secondary | ICD-10-CM

## 2020-10-27 DIAGNOSIS — E1140 Type 2 diabetes mellitus with diabetic neuropathy, unspecified: Secondary | ICD-10-CM

## 2020-10-27 DIAGNOSIS — E1165 Type 2 diabetes mellitus with hyperglycemia: Secondary | ICD-10-CM

## 2020-10-27 LAB — CBC WITH DIFFERENTIAL/PLATELET
Basophils Absolute: 0 10*3/uL (ref 0.0–0.1)
Basophils Relative: 1.4 % (ref 0.0–3.0)
Eosinophils Absolute: 0.2 10*3/uL (ref 0.0–0.7)
Eosinophils Relative: 4.7 % (ref 0.0–5.0)
HCT: 32.7 % — ABNORMAL LOW (ref 36.0–46.0)
Hemoglobin: 11 g/dL — ABNORMAL LOW (ref 12.0–15.0)
Lymphocytes Relative: 33.3 % (ref 12.0–46.0)
Lymphs Abs: 1.1 10*3/uL (ref 0.7–4.0)
MCHC: 33.7 g/dL (ref 30.0–36.0)
MCV: 83.1 fl (ref 78.0–100.0)
Monocytes Absolute: 0.4 10*3/uL (ref 0.1–1.0)
Monocytes Relative: 11.9 % (ref 3.0–12.0)
Neutro Abs: 1.6 10*3/uL (ref 1.4–7.7)
Neutrophils Relative %: 48.7 % (ref 43.0–77.0)
Platelets: 317 10*3/uL (ref 150.0–400.0)
RBC: 3.93 Mil/uL (ref 3.87–5.11)
RDW: 13.3 % (ref 11.5–15.5)
WBC: 3.2 10*3/uL — ABNORMAL LOW (ref 4.0–10.5)

## 2020-10-27 LAB — LIPID PANEL
Cholesterol: 120 mg/dL (ref 0–200)
HDL: 36.9 mg/dL — ABNORMAL LOW (ref >39.00)
LDL Cholesterol: 68 mg/dL (ref 0–99)
NonHDL: 83.14
Total CHOL/HDL Ratio: 3
Triglycerides: 74 mg/dL (ref 0.0–149.0)
VLDL: 14.8 mg/dL (ref 0.0–40.0)

## 2020-10-27 LAB — COMPREHENSIVE METABOLIC PANEL
ALT: 14 U/L (ref 0–35)
AST: 17 U/L (ref 0–37)
Albumin: 4.3 g/dL (ref 3.5–5.2)
Alkaline Phosphatase: 85 U/L (ref 39–117)
BUN: 24 mg/dL — ABNORMAL HIGH (ref 6–23)
CO2: 31 mEq/L (ref 19–32)
Calcium: 10.3 mg/dL (ref 8.4–10.5)
Chloride: 107 mEq/L (ref 96–112)
Creatinine, Ser: 1.22 mg/dL — ABNORMAL HIGH (ref 0.40–1.20)
GFR: 45.7 mL/min — ABNORMAL LOW (ref >60.00)
Glucose, Bld: 82 mg/dL (ref 70–99)
Potassium: 4 mEq/L (ref 3.5–5.1)
Sodium: 144 mEq/L (ref 135–145)
Total Bilirubin: 0.5 mg/dL (ref 0.2–1.2)
Total Protein: 7.2 g/dL (ref 6.0–8.3)

## 2020-10-27 LAB — IRON: Iron: 61 ug/dL (ref 42–145)

## 2020-10-27 NOTE — Progress Notes (Signed)
 Subjective:    Patient ID: Olivia Morales, female    DOB: 08/01/1950, 70 y.o.   MRN: [id number]  HPI  Pt in for follow up from ED.  Below in " hpi.  "Patient states 1 week ago she had a day of nausea, vomiting, diarrhea.  Symptoms resolved until 3 days ago when he returned.  She reports many episodes diarrhea and vomiting over the past 3 days.  She is now feeling lightheaded and weak.  She reports abdominal cramping prior to having diarrhea, but no abdominal pain at other times.  She denies fevers, chills, chest pain, shortness of breath, cough.  She does not know if she is having any urinary symptoms or when she is urinating, as she is currently wearing a depends due to urgency of having a bowel movement.  She denies sick contacts.  She denies recent medication changes.  No previous history of abdominal problems.  She reports a history of hypertension, hyperlipidemia, diabetes.  She has not been checking her blood sugars over the past week. She denies any recent antibiotics use. No blood in her stool. She reports stool is now clear and mucousy."  Lab showed low k, GFR decreased, cr increased and some ketones present. In the past I had referred pt to nephrologist but did not see due to covid.   Pt states was iv hyrdrated in ED and immediately felt better. Then over next 2 days her symptoms resolved completely.   A/P from ED.  "Patient presented for evaluation of nausea, vomiting, diarrhea and intermittent abdominal cramping.  On exam, patient appears nontoxic.  She has no abdominal tenderness on my exam.  Of note, she is found to be bradycardic and irregular, she reports no previous history of cardiac problems.  Consider electrolyte abnormality.  Less likely cardiac as patient is not having any chest pain or shortness of breath.  Will give fluids and symptomatic medications.  Will obtain labs and CT abdomen pelvis for further evaluation.  Labs interpreted by me, overall reassuring.   Kidney, liver, pancreatic function normal.  Electrolytes stable.  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  Urine without clear signs of infection, more consistent with dehydration.  Patient without urinary symptoms, will hold off on antibiotic treatment at this time.  Kidney function slightly improved from previous.    On reassessment, patient reports significant improvement of symptoms with Zofran and fluids.  Repeat EKG improved, no longer irregular bradycardia has improved.  Discussed findings with patient and plan for symptomatic treatment.  Discussed follow-up with primary care.  At this time, patient appears safe for discharge.  Return precautions given.  Patient states she understands and agrees to plan."     Pt bp is high but did not take her bp med today. No cardiac or neurologic signs or symptoms.    Review of Systems  Constitutional: Negative for chills and fatigue.  Respiratory: Negative for cough, chest tightness and wheezing.   Cardiovascular: Negative for chest pain and palpitations.  Gastrointestinal: Negative for abdominal pain, constipation, diarrhea and nausea.  Genitourinary: Negative for dysuria.  Musculoskeletal: Negative for back pain.  Skin: Negative for rash.  Neurological: Negative for dizziness, seizures, weakness and headaches.  Hematological: Negative for adenopathy. Does not bruise/bleed easily.  Psychiatric/Behavioral: Negative for behavioral problems and confusion.    Past Medical History:  Diagnosis Date  . Diabetes mellitus   . Hypercholesteremia   . Hypertension   . Sleep apnea        Social History   Socioeconomic History  . Marital status: Widowed    Spouse name: Not on file  . Number of children: Not on file  . Years of education: Not on file  . Highest education level: Not on file  Occupational History  . Not on file  Tobacco Use  . Smoking status: Never Smoker  . Smokeless tobacco: Never Used  Vaping  Use  . Vaping Use: Never used  Substance and Sexual Activity  . Alcohol use: Yes    Comment: weekly  . Drug use: No  . Sexual activity: Not on file  Other Topics Concern  . Not on file  Social History Narrative  . Not on file   Social Determinants of Health   Financial Resource Strain: Not on file  Food Insecurity: Not on file  Transportation Needs: Not on file  Physical Activity: Not on file  Stress: Not on file  Social Connections: Not on file  Intimate Partner Violence: Not on file    Past Surgical History:  Procedure Laterality Date  . TUBAL LIGATION      Family History  Problem Relation Age of Onset  . Sickle cell anemia Father     Allergies  Allergen Reactions  . Ace Inhibitors     Cough    Current Outpatient Medications on File Prior to Visit  Medication Sig Dispense Refill  . amLODipine (NORVASC) 10 MG tablet TAKE 1 TABLET BY MOUTH  DAILY 90 tablet 3  . atorvastatin (LIPITOR) 40 MG tablet TAKE 1 TABLET BY MOUTH  DAILY 90 tablet 3  . blood glucose meter kit and supplies Check blood sugar 1-2 times a day (FOR ICD-E11.9). 1 each 0  . dicyclomine (BENTYL) 20 MG tablet Take 1 tablet (20 mg total) by mouth 2 (two) times daily as needed for spasms. 10 tablet 0  . diphenhydramine-acetaminophen (TYLENOL PM) 25-500 MG TABS Take 2 tablets by mouth every 4 (four) hours as needed (pain).    . Dulaglutide (TRULICITY) 0.75 MG/0.5ML SOPN INJECT THE CONTENTS OF 1  PEN SUBCUTANEOUSLY ONCE  WEEKLY 6 mL 3  . hydrochlorothiazide (MICROZIDE) 12.5 MG capsule TAKE 1 CAPSULE BY MOUTH  DAILY 90 capsule 3  . Lancets (ONETOUCH DELICA PLUS LANCET33G) MISC USE TO TEST 1-2 TIMES DAILY 100 each 0  . losartan (COZAAR) 25 MG tablet TAKE 1 TABLET BY MOUTH  DAILY 90 tablet 3  . metoprolol succinate (TOPROL-XL) 50 MG 24 hr tablet TAKE 1 TABLET BY MOUTH  DAILY WITH OR IMMEDIATELY  FOLLOWING A MEAL 90 tablet 3  . omeprazole (PRILOSEC) 20 MG capsule TAKE 1 CAPSULE BY MOUTH  DAILY 90 capsule 3  .  ondansetron (ZOFRAN ODT) 4 MG disintegrating tablet Take 1 tablet (4 mg total) by mouth every 8 (eight) hours as needed for nausea or vomiting. 15 tablet 0  . ONETOUCH VERIO test strip Check blood sugar 1-2 times daily 100 each 12   No current facility-administered medications on file prior to visit.    BP (!) 163/77   Pulse 60   Resp 18   Ht 5' 4" (1.626 m)   Wt 176 lb (79.8 kg)   SpO2 100%   BMI 30.21 kg/m      Objective:   Physical Exam  General Mental Status- Alert. General Appearance- Not in acute distress.   Skin General: Color- Normal Color. Moisture- Normal Moisture.  Neck Carotid Arteries- Normal color. Moisture- Normal Moisture. No carotid bruits. No JVD.  Chest and Lung Exam Auscultation:   Breath Sounds:-Normal.  Cardiovascular Auscultation:Rythm- Regular. Murmurs & Other Heart Sounds:Auscultation of the heart reveals- No Murmurs.  Abdomen Inspection:-Inspeection Normal. Palpation/Percussion:Note:No mass. Palpation and Percussion of the abdomen reveal- Non Tender, Non Distended + BS, no rebound or guarding.   Neurologic Cranial Nerve exam:- CN III-XII intact(No nystagmus), symmetric smile. Strength:- 5/5 equal and symmetric strength both upper and lower extremities.      Assessment & Plan:  Glad that you are feeling better after recent viral gastroenteritis type illness.  Appears that you were now rehydrated.  For follow-up on that ED visit we will recheck your metabolic panel to confirm potassium now in normal range.  We will see if your kidney function numbers are improved after hydration as well.  On review of your GFR and creatinine in the past I did go ahead and place new referral to nephrologist as he never got to see nephrologist in 2020.  Will repeat CBC to check anemia level and include iron level.  Blood pressure is high today but did not take BP medications.  Please take those meds when you arrive home.  Recheck blood pressure tomorrow and  if you would MyChart me with a blood pressure reading.  History of diabetes.  Continue Trulicity.  Last A1c well controlled at 5.9.  Include lipid panel and today's labs.  Follow-up date to be determined after lab review.  Anticipate follow-up in approximately 2 to 3 months.  Edward Saguier, PA-C 

## 2020-10-27 NOTE — Patient Instructions (Addendum)
Glad that you are feeling better after recent viral gastroenteritis type illness.  Appears that you were now rehydrated.  For follow-up on that ED visit we will recheck your metabolic panel to confirm potassium now in normal range.  We will see if your kidney function numbers are improved after hydration as well.  On review of your GFR and creatinine in the past I did go ahead and place new referral to nephrologist as he never got to see nephrologist in 2020.  Will repeat CBC to check anemia level and include iron level.  Blood pressure is high today but did not take BP medications.  Please take those meds when you arrive home.  Recheck blood pressure tomorrow and if you would MyChart me with a blood pressure reading.  History of diabetes.  Continue Trulicity.  Last A1c well controlled at 5.9.  Include lipid panel and today's labs.  Follow-up date to be determined after lab review.  Anticipate follow-up in approximately 2 to 3 months.

## 2020-10-27 NOTE — Progress Notes (Signed)
   Covid-19 Vaccination Clinic  Name:  Olivia Morales    MRN: [id number] DOB: September 22, 1950  10/27/2020  Ms. Williams-Ross was observed post Covid-19 immunization for 15 minutes without incident. She was provided with Vaccine Information Sheet and instruction to access the V-Safe system.   Ms. Williams Che was instructed to call 911 with any severe reactions post vaccine: Marland Kitchen Difficulty breathing  . Swelling of face and throat  . A fast heartbeat  . A bad rash all over body  . Dizziness and weakness   Immunizations Administered    Name Date Dose VIS Date Route   Moderna Covid-19 Booster Vaccine 10/27/2020 10:46 AM 0.25 mL 04/20/2020 Intramuscular   Manufacturer: Moderna   Lot: [id number]   NDC: 01007-121-97

## 2020-10-28 ENCOUNTER — Other Ambulatory Visit (HOSPITAL_BASED_OUTPATIENT_CLINIC_OR_DEPARTMENT_OTHER): Payer: Self-pay

## 2020-10-28 MED ORDER — MODERNA COVID-19 VACCINE 100 MCG/0.5ML IM SUSP
INTRAMUSCULAR | 0 refills | Status: DC
  Filled 2020-10-28: qty 0.25, 1d supply, fill #0

## 2020-11-22 ENCOUNTER — Ambulatory Visit (INDEPENDENT_AMBULATORY_CARE_PROVIDER_SITE_OTHER): Payer: Medicare Other

## 2020-11-22 ENCOUNTER — Other Ambulatory Visit: Payer: Self-pay

## 2020-11-22 DIAGNOSIS — E538 Deficiency of other specified B group vitamins: Secondary | ICD-10-CM

## 2020-11-22 MED ORDER — CYANOCOBALAMIN 1000 MCG/ML IJ SOLN
1000.0000 ug | Freq: Once | INTRAMUSCULAR | Status: AC
  Administered 2020-11-22: 1000 ug via INTRAMUSCULAR

## 2020-11-22 NOTE — Progress Notes (Addendum)
Olivia Morales is a 70 y.o. female presents to the office today for b12 injections, per physician's orders. Original order: 03/2020 Cyanocobalamin (med), 1,000 mcg(dose),  IM (route) was administered left (location) today. Patient tolerated injection.  Patient next injection due: one month, appt made Yes 12/23/2020  [name], PA-C

## 2020-12-15 ENCOUNTER — Ambulatory Visit: Payer: Medicare Other | Attending: Internal Medicine

## 2020-12-15 DIAGNOSIS — Z23 Encounter for immunization: Secondary | ICD-10-CM

## 2020-12-15 NOTE — Progress Notes (Signed)
   Covid-19 Vaccination Clinic  Name:  Olivia Morales    MRN: [id number] DOB: 1951/06/15  12/15/2020  Olivia Morales was observed post Covid-19 immunization for 15 minutes without incident. She was provided with Vaccine Information Sheet and instruction to access the V-Safe system.   Olivia Morales was instructed to call 911 with any severe reactions post vaccine: Difficulty breathing  Swelling of face and throat  A fast heartbeat  A bad rash all over body  Dizziness and weakness   Immunizations Administered     Name Date Dose VIS Date Route   Moderna Covid-19 Booster Vaccine 12/15/2020 10:37 AM 0.25 mL 04/20/2020 Intramuscular   Manufacturer: Moderna   Lot: [id number]   NDC: 43568-616-83

## 2020-12-16 ENCOUNTER — Other Ambulatory Visit (HOSPITAL_BASED_OUTPATIENT_CLINIC_OR_DEPARTMENT_OTHER): Payer: Self-pay

## 2020-12-16 MED ORDER — COVID-19 MRNA VACC (MODERNA) 100 MCG/0.5ML IM SUSP
INTRAMUSCULAR | 0 refills | Status: DC
  Filled 2020-12-16: qty 0.25, 1d supply, fill #0

## 2020-12-19 ENCOUNTER — Other Ambulatory Visit (HOSPITAL_BASED_OUTPATIENT_CLINIC_OR_DEPARTMENT_OTHER): Payer: Self-pay

## 2020-12-23 ENCOUNTER — Ambulatory Visit (INDEPENDENT_AMBULATORY_CARE_PROVIDER_SITE_OTHER): Payer: Medicare Other

## 2020-12-23 ENCOUNTER — Other Ambulatory Visit: Payer: Self-pay

## 2020-12-23 DIAGNOSIS — E538 Deficiency of other specified B group vitamins: Secondary | ICD-10-CM

## 2020-12-23 MED ORDER — CYANOCOBALAMIN 1000 MCG/ML IJ SOLN
1000.0000 ug | Freq: Once | INTRAMUSCULAR | Status: AC
  Administered 2020-12-23: 1000 ug via INTRAMUSCULAR

## 2020-12-23 NOTE — Progress Notes (Addendum)
Olivia Morales is a 70 y.o. female presents to the office today for monthly b12 injection per physician's orders.  cyanocobalamin (med), 1,000 (dose),  IM (route) was administered left deltoid (location) today. Patient tolerated injection.  Patient next injection due: one month, appt made Yes  [name], PA-C

## 2021-01-05 ENCOUNTER — Ambulatory Visit: Payer: Self-pay

## 2021-01-05 ENCOUNTER — Encounter: Payer: Self-pay | Admitting: Family Medicine

## 2021-01-05 ENCOUNTER — Other Ambulatory Visit: Payer: Self-pay

## 2021-01-05 ENCOUNTER — Ambulatory Visit: Payer: Medicare Other | Admitting: Family Medicine

## 2021-01-05 VITALS — BP 152/90 | Ht 62.0 in | Wt 182.0 lb

## 2021-01-05 DIAGNOSIS — M79672 Pain in left foot: Secondary | ICD-10-CM | POA: Diagnosis not present

## 2021-01-05 DIAGNOSIS — G5603 Carpal tunnel syndrome, bilateral upper limbs: Secondary | ICD-10-CM | POA: Diagnosis not present

## 2021-01-05 DIAGNOSIS — M79644 Pain in right finger(s): Secondary | ICD-10-CM

## 2021-01-05 NOTE — Assessment & Plan Note (Signed)
Possible pain to be associated with changes of the midfoot joints. -Counseled on home exercise therapy and supportive care. -Pennsaid samples.

## 2021-01-05 NOTE — Progress Notes (Signed)
Medication Samples have been provided to the patient.  Drug name: Pennsaid       Strength: 2%        Qty: 2 boxes LOT: [id number]  Exp.Date: 12/2021  Dosing instructions: use a pea sized amount   The patient has been instructed regarding the correct time, dose, and frequency of taking this medication, including desired effects and most common side effects.   Lanier Prude 9:26 AM 01/05/2021

## 2021-01-05 NOTE — Assessment & Plan Note (Signed)
Has significant enlargement of the median nerves bilaterally.  Mildly the left nerve is much larger than the right but the right is more symptomatic. -Counseled on home exercise therapy and supportive care. -Provided bracing. -Could consider injection.

## 2021-01-05 NOTE — Progress Notes (Signed)
  Olivia Morales - 70 y.o. female MRN [id number]  Date of birth: 12-29-1950  SUBJECTIVE:  Including CC & ROS.  No chief complaint on file.   Olivia Morales is a 70 y.o. female that is presenting with bilateral hand pain and numbness.  It is occurring over the first 3 digits of the left and right hand.  Seems only occur at night.  Also having dorsal sided left foot pain.  No injury or inciting event.  Denies any pain through the course of the day..   Review of Systems See HPI   HISTORY: Past Medical, Surgical, Social, and Family History Reviewed & Updated per EMR.   Pertinent Historical Findings include:  Past Medical History:  Diagnosis Date   Diabetes mellitus    Hypercholesteremia    Hypertension    Sleep apnea     Past Surgical History:  Procedure Laterality Date   TUBAL LIGATION      Family History  Problem Relation Age of Onset   Sickle cell anemia Father     Social History   Socioeconomic History   Marital status: Widowed    Spouse name: Not on file   Number of children: Not on file   Years of education: Not on file   Highest education level: Not on file  Occupational History   Not on file  Tobacco Use   Smoking status: Never   Smokeless tobacco: Never  Vaping Use   Vaping Use: Never used  Substance and Sexual Activity   Alcohol use: Yes    Comment: weekly   Drug use: No   Sexual activity: Not on file  Other Topics Concern   Not on file  Social History Narrative   Not on file   Social Determinants of Health   Financial Resource Strain: Not on file  Food Insecurity: Not on file  Transportation Needs: Not on file  Physical Activity: Not on file  Stress: Not on file  Social Connections: Not on file  Intimate Partner Violence: Not on file     PHYSICAL EXAM:  VS: BP (!) 152/90 (BP Location: Left Arm, Patient Position: Sitting, Cuff Size: Normal)   Ht 5\' 2"  (1.575 m)   Wt 182 lb (82.6 kg)   BMI 33.29 kg/m  Physical Exam Gen:  NAD, alert, cooperative with exam, well-appearing MSK:  Left and right hand: Right index finger appears to be more swollen. Normal grip strength. No signs of atrophy. Neurovascularly intact  Limited ultrasound: Right hand, left hand:  Right hand: No effusion at the second MCP joint.  The synovium does appear to be enlarged with questionable hyperechoic area within the joint. No changes of the flexor tendon of the index finger. Median nerve appears enlarged and flattened and measures larger than normal.  Left hand: No increased synovium at the left index finger MCP joint or any effusion. Median nerve is significantly large at 0.27 cm.  Summary: Findings most suggestive of carpal tunnel disease bilaterally.  Ultrasound and interpretation by , MD    ASSESSMENT & PLAN:   Carpal tunnel syndrome, bilateral Has significant enlargement of the median nerves bilaterally.  Mildly the left nerve is much larger than the right but the right is more symptomatic. -Counseled on home exercise therapy and supportive care. -Provided bracing. -Could consider injection.  Left foot pain Possible pain to be associated with changes of the midfoot joints. -Counseled on home exercise therapy and supportive care. -Pennsaid samples.

## 2021-01-05 NOTE — Patient Instructions (Signed)
Nice to meet you Please try the braces at night  Please try the exercises  Please try the rub on medicine   Please send me a message in MyChart with any questions or updates.  Please see me back in 4 weeks .   --Dr. Jordan Likes

## 2021-01-08 ENCOUNTER — Other Ambulatory Visit: Payer: Self-pay | Admitting: Medical

## 2021-01-23 NOTE — Progress Notes (Addendum)
Olivia Morales is a 69 y.o. female presents to the office today for monthly B12 injections, per physician's orders.  Original order: Esperanza Richters, PA-C "Since levels are peristing low recommend b12 injection weekly for 4 weeks followed by monthly b12 for 5 months. After you do weekly injection take b125000 mcg over the counter daily. Repeat b12 level in 3 months." 08/18/2020   B12 1,000 mcg was administered L Deltoid IM today. Patient tolerated injection. Patient due for follow up labs/provider appt: Yes. Date due: "Anticipate follow-up in approximately 2 to 3 months." - 10/27/20 appointment --appt made Yes 02/21/21 8:40 am Patient next injection due: Lab check is due next- this was the 5th month, appt made Yes- will plan to check that same day.  Creft, Lia Foyer, PA-C

## 2021-01-24 ENCOUNTER — Other Ambulatory Visit: Payer: Self-pay

## 2021-01-24 ENCOUNTER — Ambulatory Visit (INDEPENDENT_AMBULATORY_CARE_PROVIDER_SITE_OTHER): Payer: Medicare Other

## 2021-01-24 DIAGNOSIS — E538 Deficiency of other specified B group vitamins: Secondary | ICD-10-CM

## 2021-01-24 MED ORDER — CYANOCOBALAMIN 1000 MCG/ML IJ SOLN
1000.0000 ug | Freq: Once | INTRAMUSCULAR | Status: AC
  Administered 2021-01-24: 1000 ug via INTRAMUSCULAR

## 2021-01-25 ENCOUNTER — Other Ambulatory Visit (HOSPITAL_BASED_OUTPATIENT_CLINIC_OR_DEPARTMENT_OTHER): Payer: Self-pay

## 2021-02-09 ENCOUNTER — Ambulatory Visit: Payer: Medicare Other | Admitting: Family Medicine

## 2021-02-14 ENCOUNTER — Ambulatory Visit (INDEPENDENT_AMBULATORY_CARE_PROVIDER_SITE_OTHER): Payer: Medicare Other | Admitting: Family Medicine

## 2021-02-14 ENCOUNTER — Encounter: Payer: Self-pay | Admitting: Family Medicine

## 2021-02-14 ENCOUNTER — Other Ambulatory Visit (HOSPITAL_BASED_OUTPATIENT_CLINIC_OR_DEPARTMENT_OTHER): Payer: Self-pay

## 2021-02-14 ENCOUNTER — Other Ambulatory Visit: Payer: Self-pay

## 2021-02-14 VITALS — BP 150/80 | Ht 62.0 in | Wt 182.0 lb

## 2021-02-14 DIAGNOSIS — G5603 Carpal tunnel syndrome, bilateral upper limbs: Secondary | ICD-10-CM | POA: Diagnosis not present

## 2021-02-14 DIAGNOSIS — M170 Bilateral primary osteoarthritis of knee: Secondary | ICD-10-CM | POA: Insufficient documentation

## 2021-02-14 MED ORDER — DICLOFENAC SODIUM 2 % EX SOLN: 1.0000 "application " | Freq: Two times a day (BID) | CUTANEOUS | 2 refills | Status: DC

## 2021-02-14 NOTE — Assessment & Plan Note (Signed)
Has pain in the medial compartment of each knee that seems to be worse at night and gets better with movement.  Likely related to degenerative changes. -Counseled on home exercise therapy and supportive care. -Pennsaid and samples provided.

## 2021-02-14 NOTE — Progress Notes (Signed)
  Olivia Morales - 70 y.o. female MRN [id number]  Date of birth: August 29, 1950  SUBJECTIVE:  Including CC & ROS.  No chief complaint on file.   Olivia Morales is a 70 y.o. female that is following up for her hand pain and foot pain.  She also has some bilateral knee pain at night.   Review of Systems See HPI   HISTORY: Past Medical, Surgical, Social, and Family History Reviewed & Updated per EMR.   Pertinent Historical Findings include:  Past Medical History:  Diagnosis Date   Diabetes mellitus    Hypercholesteremia    Hypertension    Sleep apnea     Past Surgical History:  Procedure Laterality Date   TUBAL LIGATION      Family History  Problem Relation Age of Onset   Sickle cell anemia Father     Social History   Socioeconomic History   Marital status: Widowed    Spouse name: Not on file   Number of children: Not on file   Years of education: Not on file   Highest education level: Not on file  Occupational History   Not on file  Tobacco Use   Smoking status: Never   Smokeless tobacco: Never  Vaping Use   Vaping Use: Never used  Substance and Sexual Activity   Alcohol use: Yes    Comment: weekly   Drug use: No   Sexual activity: Not on file  Other Topics Concern   Not on file  Social History Narrative   Not on file   Social Determinants of Health   Financial Resource Strain: Not on file  Food Insecurity: Not on file  Transportation Needs: Not on file  Physical Activity: Not on file  Stress: Not on file  Social Connections: Not on file  Intimate Partner Violence: Not on file     PHYSICAL EXAM:  VS: BP (!) 150/80 (BP Location: Left Arm, Patient Position: Sitting, Cuff Size: Large)   Ht 5\' 2"  (1.575 m)   Wt 182 lb (82.6 kg)   BMI 33.29 kg/m  Physical Exam Gen: NAD, alert, cooperative with exam, well-appearing      ASSESSMENT & PLAN:   Carpal tunnel syndrome, bilateral Has gotten significant improvement with braces at  night. -Counseled on home exercise therapy and supportive care. -Continue braces at night  Primary osteoarthritis of both knees Has pain in the medial compartment of each knee that seems to be worse at night and gets better with movement.  Likely related to degenerative changes. -Counseled on home exercise therapy and supportive care. -Pennsaid and samples provided.

## 2021-02-14 NOTE — Progress Notes (Signed)
Medication Samples have been provided to the patient.  Drug name: Pennsaid       Strength: 2%        Qty: 2 boxes LOT: [id number]  Exp.Date: 12/30/2021  Dosing instructions: use a pea size amount   The patient has been instructed regarding the correct time, dose, and frequency of taking this medication, including desired effects and most common side effects.   Lanier Prude 9:08 AM 02/14/2021

## 2021-02-14 NOTE — Patient Instructions (Signed)
Good to see you °Please try the pennsaid   °Please send me a message in MyChart with any questions or updates.  °Please see us back as needed.  ° °--Dr. Hema Lanza ° °

## 2021-02-14 NOTE — Assessment & Plan Note (Signed)
Has gotten significant improvement with braces at night. -Counseled on home exercise therapy and supportive care. -Continue braces at night

## 2021-02-15 DIAGNOSIS — D649 Anemia, unspecified: Secondary | ICD-10-CM | POA: Insufficient documentation

## 2021-02-16 ENCOUNTER — Other Ambulatory Visit (HOSPITAL_BASED_OUTPATIENT_CLINIC_OR_DEPARTMENT_OTHER): Payer: Self-pay

## 2021-02-16 MED ORDER — MAGNESIUM OXIDE 400 MG PO TABS
400.0000 mg | ORAL_TABLET | Freq: Every day | ORAL | 5 refills | Status: DC
  Filled 2021-02-16: qty 120, 120d supply, fill #0

## 2021-02-21 ENCOUNTER — Ambulatory Visit: Payer: Medicare Other | Admitting: Medical

## 2021-02-27 ENCOUNTER — Telehealth: Payer: Self-pay | Admitting: Medical

## 2021-02-27 NOTE — Telephone Encounter (Signed)
Left message for patient to call back and schedule Medicare Annual Wellness Visit (AWV) in office.  ° °If not able to come in office, please offer to do virtually or by telephone.  Left office number and my jabber #[phone]. ° °Due for AWVI ° °Please schedule at anytime with Nurse Health Advisor. °  °

## 2021-03-02 ENCOUNTER — Ambulatory Visit: Payer: Medicare Other | Admitting: Medical

## 2021-03-03 ENCOUNTER — Ambulatory Visit: Payer: Medicare Other | Admitting: Medical

## 2021-03-03 ENCOUNTER — Other Ambulatory Visit: Payer: Self-pay

## 2021-03-03 VITALS — BP 140/80 | HR 82 | Temp 98.2°F | Resp 18 | Wt 183.4 lb

## 2021-03-03 DIAGNOSIS — Z202 Contact with and (suspected) exposure to infections with a predominantly sexual mode of transmission: Secondary | ICD-10-CM

## 2021-03-03 DIAGNOSIS — E538 Deficiency of other specified B group vitamins: Secondary | ICD-10-CM | POA: Diagnosis not present

## 2021-03-03 DIAGNOSIS — E1140 Type 2 diabetes mellitus with diabetic neuropathy, unspecified: Secondary | ICD-10-CM

## 2021-03-03 DIAGNOSIS — I10 Essential (primary) hypertension: Secondary | ICD-10-CM

## 2021-03-03 NOTE — Patient Instructions (Addendum)
Recent std exposure but [facility] testing [facility] came back negative.  Since studies negative and your not symptomatic would not recommend going thru test presently.   But would recommend repeat hiv in 6-8 weeks.  History of htn. Your bp is better on recheck. Continue current bp meds.  Hx of low b12. Pt has been on oral tabs. She hs been on b12 injections as well.  Will get a1c for diabetes. Continue trulicity.  For high cholesterol check lipiid panel. Continue atorvastatin.  Follow up date to be determined after lab review.

## 2021-03-03 NOTE — Addendum Note (Signed)
Addended by: Mervin Kung A on: 03/03/2021 03:57 PM   Modules accepted: Orders

## 2021-03-03 NOTE — Progress Notes (Signed)
Subjective:    Patient ID: Olivia Morales, female    DOB: Feb 12, 1951, 70 y.o.   MRN: [id number]  HPI Pt has labs done by nephrologist.   Mild anemia on labs.  Low gfr on prior labs and some on prior labs as well.  Pt boyfriend had trichomonas recently. Pt gave urine sample to do std screening.  Pt has only had sex with her boyfriend past 11 years.   Pt has no symptoms at all. She has no discharge, odor or pain.  No pain on urination. Recent urine culture negative.  Health dept called during interview. They did hiv, hpr, urine std gonorrhea, chlamydia, bv and trich. All those test were negative per HD.  Pt did take flaygl pending the test.      Review of Systems  Constitutional:  Negative for chills, fatigue and fever.  HENT:  Negative for dental problem.   Respiratory:  Negative for cough, chest tightness, shortness of breath and wheezing.   Cardiovascular:  Negative for chest pain and palpitations.  Gastrointestinal:  Negative for abdominal pain, anal bleeding and blood in stool.  Genitourinary:  Negative for dysuria, flank pain and hematuria.  Musculoskeletal:  Negative for back pain and gait problem.  Neurological:  Negative for dizziness, tremors, seizures, syncope, light-headedness, numbness and headaches.  Psychiatric/Behavioral:  Negative for behavioral problems and confusion.      Past Medical History:  Diagnosis Date   Diabetes mellitus    Hypercholesteremia    Hypertension    Sleep apnea      Social History   Socioeconomic History   Marital status: Widowed    Spouse name: Not on file   Number of children: Not on file   Years of education: Not on file   Highest education level: Not on file  Occupational History   Not on file  Tobacco Use   Smoking status: Never   Smokeless tobacco: Never  Vaping Use   Vaping Use: Never used  Substance and Sexual Activity   Alcohol use: Yes    Comment: weekly   Drug use: No   Sexual activity: Not on  file  Other Topics Concern   Not on file  Social History Narrative   Not on file   Social Determinants of Health   Financial Resource Strain: Not on file  Food Insecurity: Not on file  Transportation Needs: Not on file  Physical Activity: Not on file  Stress: Not on file  Social Connections: Not on file  Intimate Partner Violence: Not on file    Past Surgical History:  Procedure Laterality Date   TUBAL LIGATION      Family History  Problem Relation Age of Onset   Sickle cell anemia Father     Allergies  Allergen Reactions   Ace Inhibitors     Cough    Current Outpatient Medications on File Prior to Visit  Medication Sig Dispense Refill   amLODipine (NORVASC) 10 MG tablet TAKE 1 TABLET BY MOUTH  DAILY 90 tablet 3   atorvastatin (LIPITOR) 40 MG tablet TAKE 1 TABLET BY MOUTH  DAILY 90 tablet 3   blood glucose meter kit and supplies Check blood sugar 1-2 times a day (FOR ICD-E11.9). 1 each 0   COVID-19 mRNA vaccine, Moderna, 100 MCG/0.5ML injection Inject into the muscle. 0.25 mL 0   Diclofenac Sodium (PENNSAID) 2 % SOLN Place 1 application onto the skin 2 (two) times daily. 112 g 2   dicyclomine (BENTYL) 20 MG tablet Take  1 tablet (20 mg total) by mouth 2 (two) times daily as needed for spasms. 10 tablet 0   diphenhydramine-acetaminophen (TYLENOL PM) 25-500 MG TABS Take 2 tablets by mouth every 4 (four) hours as needed (pain).     hydrochlorothiazide (MICROZIDE) 12.5 MG capsule TAKE 1 CAPSULE BY MOUTH  DAILY 90 capsule 3   Lancets (ONETOUCH DELICA PLUS [id number]) MISC USE TO TEST 1-2 TIMES DAILY 100 each 0   losartan (COZAAR) 25 MG tablet TAKE 1 TABLET BY MOUTH  DAILY 90 tablet 3   magnesium oxide (MAG-OX) 400 MG tablet Take 1 tablet (400 mg total) by mouth daily. 30 tablet 5   metoprolol succinate (TOPROL-XL) 50 MG 24 hr tablet TAKE 1 TABLET BY MOUTH  DAILY WITH OR IMMEDIATELY  FOLLOWING A MEAL 90 tablet 3   omeprazole (PRILOSEC) 20 MG capsule TAKE 1 CAPSULE BY MOUTH   DAILY 90 capsule 3   ondansetron (ZOFRAN ODT) 4 MG disintegrating tablet Take 1 tablet (4 mg total) by mouth every 8 (eight) hours as needed for nausea or vomiting. 15 tablet 0   ONETOUCH VERIO test strip Check blood sugar 1-2 times daily 725 each 12   TRULICITY 3.66 YQ/0.3KV SOPN INJECT THE CONTENTS OF 1  PEN SUBCUTANEOUSLY ONCE  WEEKLY 6 mL 3   No current facility-administered medications on file prior to visit.    BP (!) 142/89 (BP Location: Left Arm, Patient Position: Sitting, Cuff Size: Normal)   Pulse 82   Temp 98.2 F (36.8 C) (Oral)   Resp 18   Wt 183 lb 6.4 oz (83.2 kg)   SpO2 98%   BMI 33.54 kg/m        Objective:   Physical Exam  General Mental Status- Alert. General Appearance- Not in acute distress.   Skin General: Color- Normal Color. Moisture- Normal Moisture.  Neck Carotid Arteries- Normal color. Moisture- Normal Moisture. No carotid bruits. No JVD.  Chest and Lung Exam Auscultation: Breath Sounds:-Normal.  Cardiovascular Auscultation:Rythm- Regular. Murmurs & Other Heart Sounds:Auscultation of the heart reveals- No Murmurs.  Abdomen Inspection:-Inspeection Normal. Palpation/Percussion:Note:No mass. Palpation and Percussion of the abdomen reveal- Non Tender, Non Distended + BS, no rebound or guarding.   Neurologic Cranial Nerve exam:- CN III-XII intact(No nystagmus), symmetric smile. Strength:- 5/5 equal and symmetric strength both upper and lower extremities.       Assessment & Plan:   Recent std exposure but all testing at health department came back negative.  Since studies negative and your not symptomatic would not recommend going thru test presently.   But would recommend repeat hiv in 6-8 weeks.  History of htn. Your bp is better on recheck. Continue current bp meds.  Hx of low b12. Pt has been on oral tabs. She hs been on b12 injections as well.  Will get a1c for diabetes. Continue trulicity.  For high cholesterol check lipiid  panel. Continue atorvastatin.  Follow up date to be determined after lab review.  Mackie Pai, PA-C

## 2021-03-04 LAB — COMPREHENSIVE METABOLIC PANEL
AG Ratio: 1.5 (calc) (ref 1.0–2.5)
ALT: 25 U/L (ref 6–29)
AST: 25 U/L (ref 10–35)
Albumin: 4.6 g/dL (ref 3.6–5.1)
Alkaline phosphatase (APISO): 85 U/L (ref 37–153)
BUN/Creatinine Ratio: 16 (calc) (ref 6–22)
BUN: 24 mg/dL (ref 7–25)
CO2: 28 mmol/L (ref 20–32)
Calcium: 11 mg/dL — ABNORMAL HIGH (ref 8.6–10.4)
Chloride: 104 mmol/L (ref 98–110)
Creat: 1.53 mg/dL — ABNORMAL HIGH (ref 0.50–1.05)
Globulin: 3 g/dL (calc) (ref 1.9–3.7)
Glucose, Bld: 74 mg/dL (ref 65–99)
Potassium: 4.5 mmol/L (ref 3.5–5.3)
Sodium: 141 mmol/L (ref 135–146)
Total Bilirubin: 0.6 mg/dL (ref 0.2–1.2)
Total Protein: 7.6 g/dL (ref 6.1–8.1)

## 2021-03-04 LAB — HEMOGLOBIN A1C
Hgb A1c MFr Bld: 6.2 % of total Hgb — ABNORMAL HIGH (ref <5.7)
Mean Plasma Glucose: 131 mg/dL
eAG (mmol/L): 7.3 mmol/L

## 2021-03-04 LAB — LIPID PANEL
Cholesterol: 164 mg/dL (ref <200)
HDL: 43 mg/dL — ABNORMAL LOW (ref >=50)
LDL Cholesterol (Calc): 98 mg/dL (calc)
Non-HDL Cholesterol (Calc): 121 mg/dL (calc) (ref <130)
Total CHOL/HDL Ratio: 3.8 (calc) (ref <5.0)
Triglycerides: 136 mg/dL (ref <150)

## 2021-03-04 LAB — VITAMIN B12: Vitamin B-12: >2000 pg/mL — ABNORMAL HIGH (ref 200–1100)

## 2021-05-12 ENCOUNTER — Other Ambulatory Visit: Payer: Self-pay | Admitting: Medical

## 2021-06-02 NOTE — Progress Notes (Signed)
Subjective:   Olivia Morales is a 70 y.o. female who presents for an Initial Medicare Annual Wellness Visit.  I connected with Adela today by telephone and verified that I am speaking with the correct person using two identifiers. Location patient: home Location provider: work Persons participating in the virtual visit: patient, Marine scientist.    I discussed the limitations, risks, security and privacy concerns of performing an evaluation and management service by telephone and the availability of in person appointments. I also discussed with the patient that there may be a patient responsible charge related to this service. The patient expressed understanding and verbally consented to this telephonic visit.    Interactive audio and video telecommunications were attempted between this provider and patient, however failed, due to patient having technical difficulties OR patient did not have access to video capability.  We continued and completed visit with audio only.  Some vital signs may be absent or patient reported.   Time Spent with patient on telephone encounter: 20 minutes   Review of Systems     Cardiac Risk Factors include: advanced age (>73mn, >>60women);hypertension;diabetes mellitus;dyslipidemia;obesity (BMI >30kg/m2);sedentary lifestyle     Objective:    Today's Vitals   06/05/21 0946  Weight: 183 lb (83 kg)  Height: '5\' 2"'  (1.575 m)   Body mass index is 33.47 kg/m.  Advanced Directives 06/05/2021 10/18/2020 12/31/2019 09/25/2019 05/06/2018 02/24/2018 12/09/2017  Does Patient Have a Medical Advance Directive? No No No No No No No  Does patient want to make changes to medical advance directive? - - - - - No - Patient declined -  Would patient like information on creating a medical advance directive? Yes (MAU/Ambulatory/Procedural Areas - Information given) No - Patient declined - - Yes (MAU/Ambulatory/Procedural Areas - Information given) - No - Patient declined     Current Medications (verified) Outpatient Encounter Medications as of 06/05/2021  Medication Sig   amLODipine (NORVASC) 10 MG tablet TAKE 1 TABLET BY MOUTH  DAILY   atorvastatin (LIPITOR) 40 MG tablet TAKE 1 TABLET BY MOUTH  DAILY   blood glucose meter kit and supplies Check blood sugar 1-2 times a day (FOR ICD-E11.9).   COVID-19 mRNA vaccine, Moderna, 100 MCG/0.5ML injection Inject into the muscle.   Diclofenac Sodium (PENNSAID) 2 % SOLN Place 1 application onto the skin 2 (two) times daily.   dicyclomine (BENTYL) 20 MG tablet Take 1 tablet (20 mg total) by mouth 2 (two) times daily as needed for spasms.   diphenhydramine-acetaminophen (TYLENOL PM) 25-500 MG TABS Take 2 tablets by mouth every 4 (four) hours as needed (pain).   hydrochlorothiazide (MICROZIDE) 12.5 MG capsule TAKE 1 CAPSULE BY MOUTH  DAILY   Lancets (ONETOUCH DELICA PLUS [id number] MISC USE TO TEST 1-2 TIMES DAILY   losartan (COZAAR) 25 MG tablet TAKE 1 TABLET BY MOUTH  DAILY   magnesium oxide (MAG-OX) 400 MG tablet Take 1 tablet (400 mg total) by mouth daily.   metoprolol succinate (TOPROL-XL) 50 MG 24 hr tablet TAKE 1 TABLET BY MOUTH  DAILY WITH OR IMMEDIATELY  FOLLOWING A MEAL   omeprazole (PRILOSEC) 20 MG capsule TAKE 1 CAPSULE BY MOUTH  DAILY   ondansetron (ZOFRAN ODT) 4 MG disintegrating tablet Take 1 tablet (4 mg total) by mouth every 8 (eight) hours as needed for nausea or vomiting.   ONETOUCH VERIO test strip Check blood sugar 1-2 times daily   TRULICITY 00.27MOZ/3.6UYSOPN INJECT THE CONTENTS OF 1  PEN SUBCUTANEOUSLY ONCE  WEEKLY  No facility-administered encounter medications on file as of 06/05/2021.    Allergies (verified) Ace inhibitors   History: Past Medical History:  Diagnosis Date   Diabetes mellitus    Hypercholesteremia    Hypertension    Sleep apnea    Past Surgical History:  Procedure Laterality Date   TUBAL LIGATION     Family History  Problem Relation Age of Onset   Sickle cell  anemia Father    Social History   Socioeconomic History   Marital status: Widowed    Spouse name: Not on file   Number of children: Not on file   Years of education: Not on file   Highest education level: Not on file  Occupational History   Not on file  Tobacco Use   Smoking status: Never   Smokeless tobacco: Never  Vaping Use   Vaping Use: Never used  Substance and Sexual Activity   Alcohol use: Yes    Comment: weekly   Drug use: No   Sexual activity: Not on file  Other Topics Concern   Not on file  Social History Narrative   Not on file   Social Determinants of Health   Financial Resource Strain: Low Risk    Difficulty of Paying Living Expenses: Not hard at all  Food Insecurity: No Food Insecurity   Worried About Running Out of Food in the Last Year: Never true   [place] in the Last Year: Never true  Transportation Needs: No Transportation Needs   Lack of Transportation (Medical): No   Lack of Transportation (Non-Medical): No  Physical Activity: Inactive   Days of Exercise per Week: 0 days   Minutes of Exercise per Session: 0 min  Stress: No Stress Concern Present   Feeling of Stress : Not at all  Social Connections: Moderately Integrated   Frequency of Communication with Friends and Family: More than three times a week   Frequency of Social Gatherings with Friends and Family: More than three times a week   Attends Religious Services: More than 4 times per year   Active Member of [employer] or Organizations: Yes   Attends Archivist Meetings: More than 4 times per year   Marital Status: Widowed    Tobacco Counseling Counseling given: Not Answered   Clinical Intake:  Pre-visit preparation completed: Yes        BMI - recorded: 33.47 Nutritional Status: BMI > 30  Obese Nutritional Risks: None Diabetes: Yes CBG done?: No Did pt. bring in CBG monitor from home?: No (phone visit)  How often do you need to have someone help you when you  read instructions, pamphlets, or other written materials from your doctor or pharmacy?: 1 - Never  Diabetes:  Is the patient diabetic?  Yes  If diabetic, was a CBG obtained today?  No  Did the patient bring in their glucometer from home?  No phone visit How often do you monitor your CBG's? occasionally.   Financial Strains and Diabetes Management:  Are you having any financial strains with the device, your supplies or your medication? No .  Does the patient want to be seen by Chronic Care Management for management of their diabetes?  No  Would the patient like to be referred to a Nutritionist or for Diabetic Management?  No   Diabetic Exams:  Diabetic Eye Exam: . Overdue for diabetic eye exam. Pt has been advised about the importance in completing this exam. Patient plans to make an appt.  Diabetic Foot Exam: Pt has been advised about the importance in completing this exam. To be completed by PCP.   Interpreter Needed?: No  Information entered by :: Caroleen Hamman LPN   Activities of Daily Living In your present state of health, do you have any difficulty performing the following activities: 06/05/2021  Hearing? N  Vision? N  Difficulty concentrating or making decisions? N  Walking or climbing stairs? N  Dressing or bathing? N  Doing errands, shopping? N  Preparing Food and eating ? N  Using the Toilet? N  In the past six months, have you accidently leaked urine? N  Do you have problems with loss of bowel control? N  Managing your Medications? N  Managing your Finances? N  Housekeeping or managing your Housekeeping? N  Some recent data might be hidden    Patient Care Team: Saguier, Iris Pert as PCP - General (Internal Medicine)  Indicate any recent Medical Services you may have received from other than [facility] providers in the past year (date may be approximate).     Assessment:   This is a routine wellness examination for Olivia Morales.  Hearing/Vision screen Hearing  Screening - Comments:: No issues Vision Screening - Comments:: Wears glasses Last eye exam-2021  Dietary issues and exercise activities discussed: Current Exercise Habits: The patient does not participate in regular exercise at present, Exercise limited by: None identified   Goals Addressed             This Visit's Progress    Patient Stated       Increase exercise       Depression Screen PHQ 2/9 Scores 06/05/2021 05/06/2018 04/29/2018  PHQ - 2 Score 0 0 0    Fall Risk Fall Risk  06/05/2021 10/27/2020 05/06/2018 04/29/2018  Falls in the past year? 0 0 1 Yes  Number falls in past yr: 0 0 - 1  Injury with Fall? 0 0 1 -  Risk for fall due to : - - - Impaired balance/gait  Follow up Falls prevention discussed - - Education provided;Falls prevention discussed    FALL RISK PREVENTION PERTAINING TO THE HOME:  Any stairs in or around the home? Yes  If so, are there any without handrails? No  Home free of loose throw rugs in walkways, pet beds, electrical cords, etc? Yes  Adequate lighting in your home to reduce risk of falls? Yes   ASSISTIVE DEVICES UTILIZED TO PREVENT FALLS:  Life alert? No  Use of a cane, walker or w/c? No  Grab bars in the bathroom? No  Shower chair or bench in shower? No  Elevated toilet seat or a handicapped toilet? No   TIMED UP AND GO:  Was the test performed? No . Phone visit   Cognitive Function:Normal cognitive status assessed by this Nurse Health Advisor. No abnormalities found.          Immunizations Immunization History  Administered Date(s) Administered   Fluad Quad(high Dose 65+) 04/23/2019, 04/27/2020   Influenza, High Dose Seasonal PF 06/01/2014, 09/10/2017, 04/01/2018   Influenza, Seasonal, Injecte, Preservative Fre 04/01/2016   Influenza,trivalent, recombinat, inj, PF 05/26/2012, 04/13/2013   Moderna SARS-COV2 Booster Vaccination 10/27/2020, 12/15/2020   PPD Test 07/24/2019, 08/28/2019, 09/22/2019   Pneumococcal Conjugate-13  09/10/2017, 04/01/2018   Pneumococcal Polysaccharide-23 08/26/2012, 01/19/2020   Tdap 01/07/2006, 06/07/2016   Zoster Recombinat (Shingrix) 04/10/2018    TDAP status: Up to date  Flu Vaccine status: Due, Education has been provided regarding the importance of this vaccine. Advised  may receive this vaccine at local pharmacy or Health Dept. Aware to provide a copy of the vaccination record if obtained from local pharmacy or Health Dept. Verbalized acceptance and understanding.  Pneumococcal vaccine status: Up to date  Covid-19 vaccine status: Information provided on how to obtain vaccines.   Qualifies for Shingles Vaccine? No   Zostavax completed No   Shingrix Completed?: Yes  Screening Tests Health Maintenance  Topic Date Due   COVID-19 Vaccine (1) 02/11/1951   Hepatitis C Screening  Never done   Zoster Vaccines- Shingrix (2 of 2) 06/05/2018   OPHTHALMOLOGY EXAM  06/22/2020   INFLUENZA VACCINE  01/30/2021   FOOT EXAM  04/27/2021   HEMOGLOBIN A1C  08/31/2021   MAMMOGRAM  09/21/2022   COLONOSCOPY (Pts 45-56yr Insurance coverage will need to be confirmed)  05/16/2026   TETANUS/TDAP  06/07/2026   Pneumonia Vaccine 68 Years old  Completed   DEXA SCAN  Completed   HPV VACCINES  Aged Out    Health Maintenance  Health Maintenance Due  Topic Date Due   COVID-19 Vaccine (1) 02/11/1951   Hepatitis C Screening  Never done   Zoster Vaccines- Shingrix (2 of 2) 06/05/2018   OPHTHALMOLOGY EXAM  06/22/2020   INFLUENZA VACCINE  01/30/2021   FOOT EXAM  04/27/2021    Colorectal cancer screening: Type of screening: Colonoscopy. Completed 05/16/2016. Repeat every 10 years  Mammogram status: Completed bilateral 09/20/2020. Repeat every year  Bone Density status: Ordered previously by PCP. Pt provided with contact info and advised to call to schedule appt.  Lung Cancer Screening: (Low Dose CT Chest recommended if Age 68-80years, 30 pack-year currently smoking OR have quit w/in  15years.) does not qualify.     Additional Screening:  Hepatitis C Screening: does qualify; Patient to discuss with PCP  Vision Screening: Recommended annual ophthalmology exams for early detection of glaucoma and other disorders of the eye. Is the patient up to date with their annual eye exam?  No  Who is the provider or what is the name of the office in which the patient attends annual eye exams? [facility]   Dental Screening: Recommended annual dental exams for proper oral hygiene  Community Resource Referral / Chronic Care Management: CRR required this visit?  No   CCM required this visit?  No      Plan:     I have personally reviewed and noted the following in the patient's chart:   Medical and social history Use of alcohol, tobacco or illicit drugs  Current medications and supplements including opioid prescriptions. Patient is not currently taking opioid prescriptions. Functional ability and status Nutritional status Physical activity Advanced directives List of other physicians Hospitalizations, surgeries, and ER visits in previous 12 months Vitals Screenings to include cognitive, depression, and falls Referrals and appointments  In addition, I have reviewed and discussed with patient certain preventive protocols, quality metrics, and best practice recommendations. A written personalized care plan for preventive services as well as general preventive health recommendations were provided to patient.   Due to this being a telephonic visit, the after visit summary with patients personalized plan was offered to patient via mail or my-chart. Per request, patient was mailed a copy of AN[place]ham LPN   [id number]/11/[id number] Nurse Health Advisor  Nurse Notes: None

## 2021-06-05 ENCOUNTER — Ambulatory Visit (INDEPENDENT_AMBULATORY_CARE_PROVIDER_SITE_OTHER): Payer: Medicare Other

## 2021-06-05 VITALS — Ht 62.0 in | Wt 183.0 lb

## 2021-06-05 DIAGNOSIS — Z0000 Encounter for general adult medical examination without abnormal findings: Secondary | ICD-10-CM

## 2021-06-05 DIAGNOSIS — Z780 Asymptomatic menopausal state: Secondary | ICD-10-CM

## 2021-06-05 NOTE — Patient Instructions (Addendum)
Olivia Morales , Thank you for taking time to complete your Medicare Wellness Visit. I appreciate your ongoing commitment to your health goals. Please review the following plan we discussed and let me know if I can assist you in the future.   Screening recommendations/referrals: Colonoscopy: Completed 05/16/2016-Due 05/16/2026 Mammogram: Completed 09/20/2020-Due 09/20/2021 Bone Density: Ordered today. Someone will call you to schedule. Recommended yearly ophthalmology/optometry visit for glaucoma screening and checkup Recommended yearly dental visit for hygiene and checkup  Vaccinations: Influenza vaccine: Due-May obtain vaccine at our office or your local pharmacy. Pneumococcal vaccine: Up to date Tdap vaccine: Up to date Shingles vaccine: Completed first dose.   Covid-19:Booster available at the pharmacy  Advanced directives: Information mailed today.  Conditions/risks identified: See problem list  Next appointment: Follow up in one year for your annual wellness visit    Preventive Care 65 Years and Older, Female Preventive care refers to lifestyle choices and visits with your health care provider that can promote health and wellness. What does preventive care include? A yearly physical exam. This is also called an annual well check. Dental exams once or twice a year. Routine eye exams. Ask your health care provider how often you should have your eyes checked. Personal lifestyle choices, including: Daily care of your teeth and gums. Regular physical activity. Eating a healthy diet. Avoiding tobacco and drug use. Limiting alcohol use. Practicing safe sex. Taking low-dose aspirin every day. Taking vitamin and mineral supplements as recommended by your health care provider. What happens during an annual well check? The services and screenings done by your health care provider during your annual well check will depend on your age, overall health, lifestyle risk factors, and  family history of disease. Counseling  Your health care provider may ask you questions about your: Alcohol use. Tobacco use. Drug use. Emotional well-being. Home and relationship well-being. Sexual activity. Eating habits. History of falls. Memory and ability to understand (cognition). Work and work Astronomer. Reproductive health. Screening  You may have the following tests or measurements: Height, weight, and BMI. Blood pressure. Lipid and cholesterol levels. These may be checked every 5 years, or more frequently if you are over 48 years old. Skin check. Lung cancer screening. You may have this screening every year starting at age 58 if you have a 30-pack-year history of smoking and currently smoke or have quit within the past 15 years. Fecal occult blood test (FOBT) of the stool. You may have this test every year starting at age 71. Flexible sigmoidoscopy or colonoscopy. You may have a sigmoidoscopy every 5 years or a colonoscopy every 10 years starting at age 35. Hepatitis C blood test. Hepatitis B blood test. Sexually transmitted disease (STD) testing. Diabetes screening. This is done by checking your blood sugar (glucose) after you have not eaten for a while (fasting). You may have this done every 1-3 years. Bone density scan. This is done to screen for osteoporosis. You may have this done starting at age 69. Mammogram. This may be done every 1-2 years. Talk to your health care provider about how often you should have regular mammograms. Talk with your health care provider about your test results, treatment options, and if necessary, the need for more tests. Vaccines  Your health care provider may recommend certain vaccines, such as: Influenza vaccine. This is recommended every year. Tetanus, diphtheria, and acellular pertussis (Tdap, Td) vaccine. You may need a Td booster every 10 years. Zoster vaccine. You may need this after age 13. Pneumococcal 13-valent  conjugate  (PCV13) vaccine. One dose is recommended after age 44. Pneumococcal polysaccharide (PPSV23) vaccine. One dose is recommended after age 81. Talk to your health care provider about which screenings and vaccines you need and how often you need them. This information is not intended to replace advice given to you by your health care provider. Make sure you discuss any questions you have with your health care provider. Document Released: 07/15/2015 Document Revised: 03/07/2016 Document Reviewed: 04/19/2015 Elsevier Interactive Patient Education  2017 [employer] Prevention in the Home Falls can cause injuries. They can happen to people of all ages. There are many things you can do to make your home safe and to help prevent falls. What can I do on the outside of my home? Regularly fix the edges of walkways and driveways and fix any cracks. Remove anything that might make you trip as you walk through a door, such as a raised step or threshold. Trim any bushes or trees on the path to your home. Use bright outdoor lighting. Clear any walking paths of anything that might make someone trip, such as rocks or tools. Regularly check to see if handrails are loose or broken. Make sure that both sides of any steps have handrails. Any raised decks and porches should have guardrails on the edges. Have any leaves, snow, or ice cleared regularly. Use sand or salt on walking paths during winter. Clean up any spills in your garage right away. This includes oil or grease spills. What can I do in the bathroom? Use night lights. Install grab bars by the toilet and in the tub and shower. Do not use towel bars as grab bars. Use non-skid mats or decals in the tub or shower. If you need to sit down in the shower, use a plastic, non-slip stool. Keep the floor dry. Clean up any water that spills on the floor as soon as it happens. Remove soap buildup in the tub or shower regularly. Attach bath mats securely with  double-sided non-slip rug tape. Do not have throw rugs and other things on the floor that can make you trip. What can I do in the bedroom? Use night lights. Make sure that you have a light by your bed that is easy to reach. Do not use any sheets or blankets that are too big for your bed. They should not hang down onto the floor. Have a firm chair that has side arms. You can use this for support while you get dressed. Do not have throw rugs and other things on the floor that can make you trip. What can I do in the kitchen? Clean up any spills right away. Avoid walking on wet floors. Keep items that you use a lot in easy-to-reach places. If you need to reach something above you, use a strong step stool that has a grab bar. Keep electrical cords out of the way. Do not use floor polish or wax that makes floors slippery. If you must use wax, use non-skid floor wax. Do not have throw rugs and other things on the floor that can make you trip. What can I do with my stairs? Do not leave any items on the stairs. Make sure that there are handrails on both sides of the stairs and use them. Fix handrails that are broken or loose. Make sure that handrails are as long as the stairways. Check any carpeting to make sure that it is firmly attached to the stairs. Fix any carpet that  is loose or worn. Avoid having throw rugs at the top or bottom of the stairs. If you do have throw rugs, attach them to the floor with carpet tape. Make sure that you have a light switch at the top of the stairs and the bottom of the stairs. If you do not have them, ask someone to add them for you. What else can I do to help prevent falls? Wear shoes that: Do not have high heels. Have rubber bottoms. Are comfortable and fit you well. Are closed at the toe. Do not wear sandals. If you use a stepladder: Make sure that it is fully opened. Do not climb a closed stepladder. Make sure that both sides of the stepladder are locked  into place. Ask someone to hold it for you, if possible. Clearly mark and make sure that you can see: Any grab bars or handrails. First and last steps. Where the edge of each step is. Use tools that help you move around (mobility aids) if they are needed. These include: Canes. Walkers. Scooters. Crutches. Turn on the lights when you go into a dark area. Replace any light bulbs as soon as they burn out. Set up your furniture so you have a clear path. Avoid moving your furniture around. If any of your floors are uneven, fix them. If there are any pets around you, be aware of where they are. Review your medicines with your doctor. Some medicines can make you feel dizzy. This can increase your chance of falling. Ask your doctor what other things that you can do to help prevent falls. This information is not intended to replace advice given to you by your health care provider. Make sure you discuss any questions you have with your health care provider. Document Released: 04/14/2009 Document Revised: 11/24/2015 Document Reviewed: 07/23/2014 Elsevier Interactive Patient Education  2017 [employer].

## 2021-06-06 ENCOUNTER — Other Ambulatory Visit (HOSPITAL_BASED_OUTPATIENT_CLINIC_OR_DEPARTMENT_OTHER): Payer: Self-pay

## 2021-06-06 ENCOUNTER — Telehealth (INDEPENDENT_AMBULATORY_CARE_PROVIDER_SITE_OTHER): Payer: Medicare Other | Admitting: Medical

## 2021-06-06 VITALS — BP 134/69 | HR 63

## 2021-06-06 DIAGNOSIS — J40 Bronchitis, not specified as acute or chronic: Secondary | ICD-10-CM | POA: Diagnosis not present

## 2021-06-06 DIAGNOSIS — R051 Acute cough: Secondary | ICD-10-CM

## 2021-06-06 MED ORDER — FLUTICASONE PROPIONATE 50 MCG/ACT NA SUSP
2.0000 | Freq: Every day | NASAL | 1 refills | Status: DC
  Filled 2021-06-06: qty 16, 30d supply, fill #0

## 2021-06-06 MED ORDER — AZITHROMYCIN 250 MG PO TABS
ORAL_TABLET | ORAL | 0 refills | Status: AC
  Filled 2021-06-06: qty 6, 5d supply, fill #0

## 2021-06-06 MED ORDER — BENZONATATE 100 MG PO CAPS
100.0000 mg | ORAL_CAPSULE | Freq: Three times a day (TID) | ORAL | 0 refills | Status: DC | PRN
  Filled 2021-06-06: qty 30, 10d supply, fill #0

## 2021-06-06 NOTE — Patient Instructions (Signed)
Bronchitis signs and symptoms since Saturday.  Patient's been vaccinated against COVID and tested negative for COVID as well.  Visit was scheduled by video initially but then had to.  Changed to phone visit as she could not connect to MyChart.  Based on signs and symptoms to start to prescribe azithromycin antibiotic, benzonatate cough medicine and Flonase nasal spray.  Asking to repeat COVID test tomorrow morning to make sure does not have COVID.  If positive will be within 5-day timeframe to start antiviral.  So would send that prescription to her pharmacy.  If signs symptoms persist or worsen despite above also recommend a chest x-ray.  Follow-up in 5 to 7 days or sooner if needed.  Also explained in the event of not improving would want in person visit as opposed to virtual.

## 2021-06-06 NOTE — Progress Notes (Signed)
   Subjective:    Patient ID: Olivia Morales, female    DOB: 1951/02/11, 70 y.o.   MRN: [id number]  HPI  Virtual Visit via Telephone Note  I connected with Olivia Morales on 06/06/21 at  2:40 PM EST by telephone and verified that I am speaking with the correct person using two identifiers.  Location: Patient: home Provider: home   I discussed the limitations, risks, security and privacy concerns of performing an evaluation and management service by telephone and the availability of in person appointments. I also discussed with the patient that there may be a patient responsible charge related to this service. The patient expressed understanding and agreed to proceed.   History of Present Illness: Pt states she has recent st and hacking productive cough since saturday. With severe hacking cough will get light headed. Pt had covid test and was negative Sunday. Symptoms started Saturday night.   Cough is productive. No wheezing or shortness of breath.   No sinus pain on self palpation.  Pt had covid vaccine before.   Observations/Objective:  General- no acute distress, pleasant, alert. Sounds nasal congested.   Assessment and Plan:  Patient Instructions  Bronchitis signs and symptoms since Saturday.  Patient's been vaccinated against COVID and tested negative for COVID as well.  Visit was scheduled by video initially but then had to.  Changed to phone visit as she could not connect to MyChart.  Based on signs and symptoms to start to prescribe azithromycin antibiotic, benzonatate cough medicine and Flonase nasal spray.  Asking to repeat COVID test tomorrow morning to make sure does not have COVID.  If positive will be within 5-day timeframe to start antiviral.  So would send that prescription to her pharmacy.  If signs symptoms persist or worsen despite above also recommend a chest x-ray.  Follow-up in 5 to 7 days or sooner if needed.  Also explained in the event  of not improving would want in person visit as opposed to virtual.   Esperanza Richters, PA-C  Follow Up Instructions:    I discussed the assessment and treatment plan with the patient. The patient was provided an opportunity to ask questions and all were answered. The patient agreed with the plan and demonstrated an understanding of the instructions.   The patient was advised to call back or seek an in-person evaluation if the symptoms worsen or if the condition fails to improve as anticipated.  Time spent with patient today was 20  minutes which consisted of chart revdiew, discussing diagnosis, work up treatment and documentation.    Esperanza Richters, PA-C   Review of Systems  Constitutional:  Negative for chills and fatigue.  HENT:  Positive for congestion and rhinorrhea. Negative for ear pain.   Respiratory:  Positive for cough. Negative for shortness of breath and wheezing.   Cardiovascular:  Negative for chest pain and palpitations.  Gastrointestinal:  Negative for abdominal pain, diarrhea and nausea.  Musculoskeletal:  Negative for back pain and myalgias.  Neurological:  Negative for dizziness and headaches.  Hematological:  Negative for adenopathy. Does not bruise/bleed easily.  Psychiatric/Behavioral:  Negative for behavioral problems.       Objective:   Physical Exam        Assessment & Plan:

## 2021-06-07 ENCOUNTER — Telehealth: Payer: Self-pay | Admitting: Medical

## 2021-06-07 NOTE — Telephone Encounter (Signed)
Pt called to let [name] know she tested negative for covid this morning.

## 2021-06-27 ENCOUNTER — Other Ambulatory Visit (HOSPITAL_BASED_OUTPATIENT_CLINIC_OR_DEPARTMENT_OTHER): Payer: Medicare Other

## 2021-07-10 ENCOUNTER — Other Ambulatory Visit (HOSPITAL_BASED_OUTPATIENT_CLINIC_OR_DEPARTMENT_OTHER): Payer: Self-pay

## 2021-07-10 MED ORDER — MAGNESIUM OXIDE -MG SUPPLEMENT 400 (240 MG) MG PO TABS
ORAL_TABLET | ORAL | 5 refills | Status: DC
  Filled 2021-07-10: qty 120, 120d supply, fill #0
  Filled 2021-11-28: qty 120, 120d supply, fill #1

## 2021-07-11 ENCOUNTER — Other Ambulatory Visit (HOSPITAL_BASED_OUTPATIENT_CLINIC_OR_DEPARTMENT_OTHER): Payer: Medicare Other

## 2021-07-11 ENCOUNTER — Ambulatory Visit: Payer: Medicare Other | Attending: Internal Medicine

## 2021-07-11 ENCOUNTER — Other Ambulatory Visit: Payer: Self-pay

## 2021-07-11 ENCOUNTER — Ambulatory Visit (HOSPITAL_BASED_OUTPATIENT_CLINIC_OR_DEPARTMENT_OTHER)
Admission: RE | Admit: 2021-07-11 | Discharge: 2021-07-11 | Disposition: A | Discharge status: Home / Self Care | Payer: Medicare Other | Source: Ambulatory Visit | Attending: Medical | Admitting: Medical

## 2021-07-11 ENCOUNTER — Telehealth: Payer: Self-pay | Admitting: Medical

## 2021-07-11 ENCOUNTER — Other Ambulatory Visit (HOSPITAL_BASED_OUTPATIENT_CLINIC_OR_DEPARTMENT_OTHER): Payer: Self-pay

## 2021-07-11 DIAGNOSIS — Z780 Asymptomatic menopausal state: Secondary | ICD-10-CM | POA: Diagnosis not present

## 2021-07-11 DIAGNOSIS — Z23 Encounter for immunization: Secondary | ICD-10-CM

## 2021-07-11 MED ORDER — INFLUENZA VAC A&B SA ADJ QUAD 0.5 ML IM PRSY
PREFILLED_SYRINGE | INTRAMUSCULAR | 0 refills | Status: DC
  Filled 2021-07-11: qty 0.5, 1d supply, fill #0

## 2021-07-11 MED ORDER — ALENDRONATE SODIUM 10 MG PO TABS: 10.0000 mg | ORAL_TABLET | Freq: Every day | ORAL | 3 refills | Status: DC

## 2021-07-11 NOTE — Telephone Encounter (Signed)
Rx fosamax sent to pt pharmacy. 

## 2021-07-11 NOTE — Progress Notes (Signed)
° °  Covid-19 Vaccination Clinic  Name:  Olivia Morales    MRN: [id number] DOB: [birth year]  07/11/2021  Olivia Morales was observed post Covid-19 immunization for 15 minutes without incident. She was provided with Vaccine Information Sheet and instruction to access the V-Safe system.   Olivia Morales was instructed to call 911 with any severe reactions post vaccine: Difficulty breathing  Swelling of face and throat  A fast heartbeat  A bad rash all over body  Dizziness and weakness   Immunizations Administered     Name Date Dose VIS Date Route   Moderna Covid-19 vaccine Bivalent Booster 07/11/2021  2:15 PM 0.5 mL 02/11/2021 Intramuscular   Manufacturer: Gala Murdoch   Lot: [id number]   NDC: 42706-237-62

## 2021-07-13 ENCOUNTER — Other Ambulatory Visit (HOSPITAL_BASED_OUTPATIENT_CLINIC_OR_DEPARTMENT_OTHER): Payer: Self-pay

## 2021-07-13 MED ORDER — MODERNA COVID-19 BIVAL BOOSTER 50 MCG/0.5ML IM SUSP
INTRAMUSCULAR | 0 refills | Status: DC
  Filled 2021-07-13: qty 0.5, 1d supply, fill #0

## 2021-07-13 NOTE — Telephone Encounter (Signed)
Patient called back after missing call from office (not sure who called her) she was informed Fosamax was sent in to her pharmacy due to results of bone density test.  Patient was really loud on the phone was yelling to [facility] before she transfer the call to me. Patient was upset that Laila didn't know what the medication was for. I explained to her that [employer] is not part of the medical staff and that is why she transferred call to me.  After explaining the medication information I asked if she had anything else that I could help her with and she said she was ok.

## 2021-07-31 ENCOUNTER — Encounter: Payer: Self-pay | Admitting: Medical

## 2021-07-31 ENCOUNTER — Ambulatory Visit: Payer: Medicare Other | Admitting: Medical

## 2021-07-31 VITALS — BP 126/70 | HR 64 | Temp 97.9°F | Resp 20 | Ht 62.0 in | Wt 185.6 lb

## 2021-07-31 DIAGNOSIS — E1165 Type 2 diabetes mellitus with hyperglycemia: Secondary | ICD-10-CM

## 2021-07-31 DIAGNOSIS — I10 Essential (primary) hypertension: Secondary | ICD-10-CM | POA: Diagnosis not present

## 2021-07-31 DIAGNOSIS — K2100 Gastro-esophageal reflux disease with esophagitis, without bleeding: Secondary | ICD-10-CM | POA: Diagnosis not present

## 2021-07-31 DIAGNOSIS — Z113 Encounter for screening for infections with a predominantly sexual mode of transmission: Secondary | ICD-10-CM

## 2021-07-31 DIAGNOSIS — R252 Cramp and spasm: Secondary | ICD-10-CM

## 2021-07-31 DIAGNOSIS — R413 Other amnesia: Secondary | ICD-10-CM

## 2021-07-31 DIAGNOSIS — E538 Deficiency of other specified B group vitamins: Secondary | ICD-10-CM

## 2021-07-31 DIAGNOSIS — E1169 Type 2 diabetes mellitus with other specified complication: Secondary | ICD-10-CM | POA: Diagnosis not present

## 2021-07-31 DIAGNOSIS — M79641 Pain in right hand: Secondary | ICD-10-CM

## 2021-07-31 DIAGNOSIS — R0683 Snoring: Secondary | ICD-10-CM

## 2021-07-31 DIAGNOSIS — E785 Hyperlipidemia, unspecified: Secondary | ICD-10-CM

## 2021-07-31 DIAGNOSIS — R768 Other specified abnormal immunological findings in serum: Secondary | ICD-10-CM

## 2021-07-31 DIAGNOSIS — M2550 Pain in unspecified joint: Secondary | ICD-10-CM

## 2021-07-31 NOTE — Progress Notes (Signed)
Subjective:    Patient ID: Olivia Morales, female    DOB: [birth year], 71 y.o.   MRN: [id number]  HPI Pt in for follow up.  Pt is about to start silver sneakers. Pt is on 8 tabs and she expresses desire to cut back.  Pt admits no exercise for past 10-15 years. She states only walks around her house   History of htn. Losartan, toprol and hctz.   Hx of low b12. Pt has been on oral tabs. She hs been on b12 injections as well. B12 was in high range on recheck.   Will get a1c for diabetes. Continue trulicity.   For high cholesterol check lipiid panel. Continue atorvastatin.  For gerd- on omeprazole. Pt stats if eats better stomach feels better.  At night pt states has cramping of rt hand with pain. She has to extend her fingers. She can move her rt upper extremity. No vision changes. No slurred speech. No rt side facial weakness.  Pt rt hand constrict every night. Wakes up at night with hand tingling nightly over a month. Pt describes knuckles feel better when messages. Has some pain in digits.   Mmse- 30/30.  Hx of sleep apnea. She could not tolerate the machine. 7-8 years since had tried.     Review of Systems  Constitutional:  Negative for chills, fatigue and fever.  HENT:  Negative for congestion, drooling, sinus pressure, sinus pain and sneezing.   Respiratory:  Negative for cough, chest tightness, shortness of breath and wheezing.   Cardiovascular:  Negative for chest pain and palpitations.  Gastrointestinal:  Negative for abdominal pain, blood in stool, constipation, diarrhea and nausea.  Genitourinary:  Negative for dyspareunia, dysuria and frequency.  Musculoskeletal:  Negative for back pain and joint swelling.  Neurological:  Negative for dizziness, weakness, numbness and headaches.  Psychiatric/Behavioral:  Negative for agitation, behavioral problems, dysphoric mood, self-injury and suicidal ideas. The patient is not nervous/anxious.        Pt states having  some memory issues. She has paid bills with wrong credit card twice. She has forgotten 3 time in past 6 months where she was driving. She failed three question on mmse screening by her insurance.   Past Medical History:  Diagnosis Date   Diabetes mellitus    Hypercholesteremia    Hypertension    Sleep apnea      Social History   Socioeconomic History   Marital status: Widowed    Spouse name: Not on file   Number of children: Not on file   Years of education: Not on file   Highest education level: Not on file  Occupational History   Not on file  Tobacco Use   Smoking status: Never   Smokeless tobacco: Never  Vaping Use   Vaping Use: Never used  Substance and Sexual Activity   Alcohol use: Yes    Comment: weekly   Drug use: No   Sexual activity: Not on file  Other Topics Concern   Not on file  Social History Narrative   Not on file   Social Determinants of Health   Financial Resource Strain: Low Risk    Difficulty of Paying Living Expenses: Not hard at all  Food Insecurity: No Food Insecurity   Worried About Running Out of Food in the Last Year: Never true   Bee in the Last Year: Never true  Transportation Needs: No Transportation Needs   Lack of Transportation (Medical): No   Lack  of Transportation (Non-Medical): No  Physical Activity: Inactive   Days of Exercise per Week: 0 days   Minutes of Exercise per Session: 0 min  Stress: No Stress Concern Present   Feeling of Stress : Not at all  Social Connections: Moderately Integrated   Frequency of Communication with Friends and Family: More than three times a week   Frequency of Social Gatherings with Friends and Family: More than three times a week   Attends Religious Services: More than 4 times per year   Active Member of [employer] or Organizations: Yes   Attends Archivist Meetings: More than 4 times per year   Marital Status: Widowed  Human resources officer Violence: Not At Risk   Fear of Current  or Ex-Partner: No   Emotionally Abused: No   Physically Abused: No   Sexually Abused: No    Past Surgical History:  Procedure Laterality Date   TUBAL LIGATION      Family History  Problem Relation Age of Onset   Sickle cell anemia Father     Allergies  Allergen Reactions   Ace Inhibitors     Cough    Current Outpatient Medications on File Prior to Visit  Medication Sig Dispense Refill   alendronate (FOSAMAX) 10 MG tablet Take 1 tablet (10 mg total) by mouth daily before breakfast. Take with a full glass of water on an empty stomach. 90 tablet 3   amLODipine (NORVASC) 10 MG tablet TAKE 1 TABLET BY MOUTH  DAILY 90 tablet 1   atorvastatin (LIPITOR) 40 MG tablet TAKE 1 TABLET BY MOUTH  DAILY 90 tablet 1   benzonatate (TESSALON) 100 MG capsule Take 1 capsule (100 mg total) by mouth 3 (three) times daily as needed. 30 capsule 0   COVID-19 mRNA bivalent vaccine, Moderna, (MODERNA COVID-19 BIVAL BOOSTER) 50 MCG/0.5ML injection Inject into the muscle. 0.5 mL 0   Diclofenac Sodium (PENNSAID) 2 % SOLN Place 1 application onto the skin 2 (two) times daily. 112 g 2   dicyclomine (BENTYL) 20 MG tablet Take 1 tablet (20 mg total) by mouth 2 (two) times daily as needed for spasms. 10 tablet 0   diphenhydramine-acetaminophen (TYLENOL PM) 25-500 MG TABS Take 2 tablets by mouth every 4 (four) hours as needed (pain).     fluticasone (FLONASE) 50 MCG/ACT nasal spray Place 2 sprays into both nostrils daily. 16 g 1   hydrochlorothiazide (MICROZIDE) 12.5 MG capsule TAKE 1 CAPSULE BY MOUTH  DAILY 90 capsule 1   influenza vaccine adjuvanted (FLUAD) 0.5 ML injection Inject into the muscle. 0.5 mL 0   Lancets (ONETOUCH DELICA PLUS 123XX123) MISC USE TO TEST 1-2 TIMES DAILY 100 each 0   losartan (COZAAR) 25 MG tablet TAKE 1 TABLET BY MOUTH  DAILY 90 tablet 1   magnesium oxide (MAG-OX) 400 (240 Mg) MG tablet Take 1 tablet by mouth once a day 120 tablet 5   magnesium oxide (MAG-OX) 400 MG tablet Take 1  tablet (400 mg total) by mouth daily. 30 tablet 5   metoprolol succinate (TOPROL-XL) 50 MG 24 hr tablet TAKE 1 TABLET BY MOUTH  DAILY WITH OR IMMEDIATELY  FOLLOWING A MEAL 90 tablet 1   omeprazole (PRILOSEC) 20 MG capsule TAKE 1 CAPSULE BY MOUTH  DAILY 90 capsule 1   ondansetron (ZOFRAN ODT) 4 MG disintegrating tablet Take 1 tablet (4 mg total) by mouth every 8 (eight) hours as needed for nausea or vomiting. 15 tablet 0   ONETOUCH VERIO test strip Check  blood sugar 1-2 times daily 123XX123 each 12   TRULICITY A999333 0000000 SOPN INJECT THE CONTENTS OF 1  PEN SUBCUTANEOUSLY ONCE  WEEKLY 6 mL 3   No current facility-administered medications on file prior to visit.    BP 126/70 (BP Location: Right Arm, Patient Position: Sitting, Cuff Size: Normal)    Pulse 64    Temp 97.9 F (36.6 C) (Oral)    Resp 20    Ht 5\' 2"  (1.575 m)    Wt 185 lb 9.6 oz (84.2 kg)    SpO2 96%    BMI 33.95 kg/m        Objective:   Physical Exam   General Mental Status- Alert. General Appearance- Not in acute distress.   Skin General: Color- Normal Color. Moisture- Normal Moisture.  Neck Carotid Arteries- Normal color. Moisture- Normal Moisture. No carotid bruits. No JVD.  Chest and Lung Exam Auscultation: Breath Sounds:-Normal.  Cardiovascular Auscultation:Rythm- Regular. Murmurs & Other Heart Sounds:Auscultation of the heart reveals- No Murmurs.  Abdomen Inspection:-Inspeection Normal. Palpation/Percussion:Note:No mass. Palpation and Percussion of the abdomen reveal- Non Tender, Non Distended + BS, no rebound or guarding.    Neurologic Cranial Nerve exam:- CN III-XII intact(No nystagmus), symmetric smile. Drift Test:- No drift. Romberg Exam:- Negative.  Heal to Toe Gait exam:-Normal. Finger to Nose:- Normal/Intact Strength:- 5/5 equal and symmetric strength both upper and lower extremities.      Assessment & Plan:   Patient Instructions  Hypertension-blood pressure well controlled today.  Continue  current BP med regimen.  Diabetes-placing future metabolic panel and 123456.  Continue Trulicity and we will see if any dose changes need to be made.  Continue low sugar diet.  Hyperlipidemia-continue atorvastatin.  Placing future lipid panel to be done hopefully this week early morning.  Please get scheduled on the way out.  GERD-continue to eat healthy and use omeprazole if needed.  Recently reported eating better and stomach feels better.  This was 1 medication that she might be able to drop.  At night you report cramping of the hands.  We will follow your potassium and magnesium level.  Normal neurologic exam today.  You describe some features of possible carpal tunnel syndrome.  But also on exam I think your digits are a little bit enlarged at the joints.  I recommend getting wrist cock-up splint and using at night.  Can use low-dose ibuprofen 200 mg.  Also will get right hand x-ray and inflammation studies.    Memory changes described but today your Mini-Mental status was 30 out of 30.  We will continue to see how you function daily and might refer you to neurologist if you have worse and memory concerns.  STD screening will include HIV today.  Snoring history with sleep apnea.  Could not tolerate CPAP machine in the past.  Place new referral.  Follow-up date to return after lab review   Mackie Pai, PA-C   Time spent with patient today was  45 minutes which consisted of chart review, discussing diagnoses, work up, treatment, doing minimental status, answering questiosn and documentation.

## 2021-07-31 NOTE — Patient Instructions (Addendum)
Hypertension-blood pressure well controlled today.  Continue current BP med regimen.  Diabetes-placing future metabolic panel and A1c.  Continue Trulicity and we will see if any dose changes need to be made.  Continue low sugar diet.  Hyperlipidemia-continue atorvastatin.  Placing future lipid panel to be done hopefully this week early morning.  Please get scheduled on the way out.  GERD-continue to eat healthy and use omeprazole if needed.  Recently reported eating better and stomach feels better.  This was 1 medication that she might be able to drop.  At night you report cramping of the hands.  We will follow your potassium and magnesium level.  Normal neurologic exam today.  You describe some features of possible carpal tunnel syndrome.  But also on exam I think your digits are a little bit enlarged at the joints.  I recommend getting wrist cock-up splint and using at night.  Can use low-dose ibuprofen 200 mg.  Also will get right hand x-ray and inflammation studies.    Memory changes described but today your Mini-Mental status was 30 out of 30.  We will continue to see how you function daily and might refer you to neurologist if you have worse and memory concerns.  STD screening will include HIV today.  Snoring history with sleep apnea.  Could not tolerate CPAP machine in the past.  Place new referral.  Follow-up date to return after lab review

## 2021-08-02 ENCOUNTER — Other Ambulatory Visit (INDEPENDENT_AMBULATORY_CARE_PROVIDER_SITE_OTHER): Payer: Medicare Other

## 2021-08-02 DIAGNOSIS — Z113 Encounter for screening for infections with a predominantly sexual mode of transmission: Secondary | ICD-10-CM

## 2021-08-02 DIAGNOSIS — M2550 Pain in unspecified joint: Secondary | ICD-10-CM | POA: Diagnosis not present

## 2021-08-02 DIAGNOSIS — R252 Cramp and spasm: Secondary | ICD-10-CM | POA: Diagnosis not present

## 2021-08-02 DIAGNOSIS — E1165 Type 2 diabetes mellitus with hyperglycemia: Secondary | ICD-10-CM

## 2021-08-02 DIAGNOSIS — E1169 Type 2 diabetes mellitus with other specified complication: Secondary | ICD-10-CM

## 2021-08-02 DIAGNOSIS — I10 Essential (primary) hypertension: Secondary | ICD-10-CM | POA: Diagnosis not present

## 2021-08-02 DIAGNOSIS — E785 Hyperlipidemia, unspecified: Secondary | ICD-10-CM | POA: Diagnosis not present

## 2021-08-02 LAB — COMPREHENSIVE METABOLIC PANEL
ALT: 11 U/L (ref 0–35)
AST: 14 U/L (ref 0–37)
Albumin: 4.4 g/dL (ref 3.5–5.2)
Alkaline Phosphatase: 108 U/L (ref 39–117)
BUN: 21 mg/dL (ref 6–23)
CO2: 33 mEq/L — ABNORMAL HIGH (ref 19–32)
Calcium: 10.5 mg/dL (ref 8.4–10.5)
Chloride: 104 mEq/L (ref 96–112)
Creatinine, Ser: 1.32 mg/dL — ABNORMAL HIGH (ref 0.40–1.20)
GFR: 41.35 mL/min — ABNORMAL LOW (ref >60.00)
Glucose, Bld: 110 mg/dL — ABNORMAL HIGH (ref 70–99)
Potassium: 4 mEq/L (ref 3.5–5.1)
Sodium: 140 mEq/L (ref 135–145)
Total Bilirubin: 0.5 mg/dL (ref 0.2–1.2)
Total Protein: 7.4 g/dL (ref 6.0–8.3)

## 2021-08-02 LAB — LIPID PANEL
Cholesterol: 152 mg/dL (ref 0–200)
HDL: 46.6 mg/dL (ref >39.00)
LDL Cholesterol: 90 mg/dL (ref 0–99)
NonHDL: 105.22
Total CHOL/HDL Ratio: 3
Triglycerides: 76 mg/dL (ref 0.0–149.0)
VLDL: 15.2 mg/dL (ref 0.0–40.0)

## 2021-08-02 LAB — C-REACTIVE PROTEIN: CRP: <1 mg/dL (ref 0.5–20.0)

## 2021-08-02 LAB — SEDIMENTATION RATE: Sed Rate: 30 mm/hr (ref 0–30)

## 2021-08-02 LAB — HEMOGLOBIN A1C: Hgb A1c MFr Bld: 6.3 % (ref 4.6–6.5)

## 2021-08-02 LAB — MAGNESIUM: Magnesium: 1.9 mg/dL (ref 1.5–2.5)

## 2021-08-03 NOTE — Addendum Note (Signed)
Addended by: Anabel Halon on: 08/03/2021 03:21 PM   Modules accepted: Orders

## 2021-08-05 LAB — HIV ANTIBODY (ROUTINE TESTING W REFLEX): HIV 1&2 Ab, 4th Generation: NONREACTIVE

## 2021-08-05 LAB — ANA: Anti Nuclear Antibody (ANA): POSITIVE — AB

## 2021-08-05 LAB — RHEUMATOID FACTOR: Rheumatoid fact SerPl-aCnc: 15 IU/mL — ABNORMAL HIGH (ref <14)

## 2021-08-05 LAB — ANTI-NUCLEAR AB-TITER (ANA TITER): ANA Titer 1: 1:320 {titer} — ABNORMAL HIGH

## 2021-08-21 ENCOUNTER — Other Ambulatory Visit (HOSPITAL_BASED_OUTPATIENT_CLINIC_OR_DEPARTMENT_OTHER): Payer: Self-pay | Admitting: Medical

## 2021-08-21 DIAGNOSIS — Z1231 Encounter for screening mammogram for malignant neoplasm of breast: Secondary | ICD-10-CM

## 2021-08-31 ENCOUNTER — Telehealth: Payer: Self-pay | Admitting: Medical

## 2021-08-31 ENCOUNTER — Other Ambulatory Visit (HOSPITAL_BASED_OUTPATIENT_CLINIC_OR_DEPARTMENT_OTHER): Payer: Self-pay | Admitting: Medical

## 2021-08-31 NOTE — Telephone Encounter (Signed)
Pt came in office stating has a referral for Bone Density to be done at [facility] office, pt is wanting to know if she can have her referral changed for her Bone Density to [facility] since it is closer for her. Pt would like to be contacted at [phone]. Please advise.  ?

## 2021-08-31 NOTE — Telephone Encounter (Signed)
Spoke with pt and advised her of calling [facility] ?

## 2021-09-05 ENCOUNTER — Other Ambulatory Visit: Payer: Self-pay | Admitting: Medical

## 2021-09-18 ENCOUNTER — Ambulatory Visit (INDEPENDENT_AMBULATORY_CARE_PROVIDER_SITE_OTHER): Payer: Medicare Other | Admitting: Pulmonary Disease

## 2021-09-18 ENCOUNTER — Encounter: Payer: Self-pay | Admitting: Pulmonary Disease

## 2021-09-18 ENCOUNTER — Other Ambulatory Visit: Payer: Self-pay

## 2021-09-18 VITALS — BP 128/84 | HR 59 | Ht 62.75 in | Wt 185.6 lb

## 2021-09-18 DIAGNOSIS — G4733 Obstructive sleep apnea (adult) (pediatric): Secondary | ICD-10-CM | POA: Diagnosis not present

## 2021-09-18 NOTE — Progress Notes (Signed)
? ?[facility] ? ?Chief Complaint  ?Patient presents with  ? Consult  ?  snoring  ? ? ?Past Surgical History:  ?She  has a past surgical history that includes Tubal ligation. ? ?Past Medical History:  ?DM, HLD, HTN, CKD 3, Anemia, GERD, COVID 19 in 2021, Sickle cell trait ? ?Constitutional:  ?BP 128/84 (BP Location: Left Arm, Cuff Size: Normal)   Pulse (!) 59   Ht 5' 2.75" (1.594 m)   Wt 185 lb 9.6 oz (84.2 kg)   SpO2 98%   BMI 33.14 kg/m?  ? ?Brief Summary:  ?Olivia Morales is a 71 y.o. female with obstructive sleep apnea. ?  ? ? ? ?Subjective:  ? ?She had a sleep study in 2017.  Showed moderate sleep apnea.  She was tried on CPAP.  Has trouble adjusting to the mask and breathing with machine.  The mask also scared her grandchildren. ? ?Her boyfriend says she snores stops breathing.  She can fall asleep whenever she gets a chance to rest.   ? ?She goes to sleep at 7 pm.  She falls asleep quickly.  She wakes up several times to use the bathroom.  She gets out of bed at 8 am.  She feels okay in the morning, but needs to nap after she eats.  She denies morning headache.  She does not use anything to help her fall sleep or stay awake. ? ?She denies sleep walking, sleep talking, bruxism, or nightmares.  There is no history of restless legs.  She denies sleep hallucinations, sleep paralysis, or cataplexy. ? ?The Epworth score is 14 out of 24. ? ? ?Physical Exam:  ? ?Appearance - well kempt  ? ?ENMT - no sinus tenderness, no oral exudate, no LAN, Mallampati 3 airway, no stridor ? ?Respiratory - equal breath sounds bilaterally, no wheezing or rales ? ?CV - s1s2 regular rate and rhythm, no murmurs ? ?Ext - no clubbing, no edema ? ?Skin - no rashes ? ?Psych - normal mood and affect ?  ?Sleep Tests:  ?HST 03/23/16 >> AHI 29, SpO2 low 63% ? ?Social History:  ?She  reports that she has never smoked. She has never used smokeless tobacco. She reports current alcohol use. She  reports that she does not use drugs. ? ?Family History:  ?Her family history includes Sickle cell anemia in her father. ?  ? ?Discussion:  ?She has snoring, sleep disruption, apnea, and daytime sleepiness.  She has history of hypertension, diabetes, and chronic kidney disease.  Her previous sleep study showed sleep apnea.  I am concerned she still has significant sleep apnea. ? ?Assessment/Plan:  ? ?Snoring with excessive daytime sleepiness. ?- will need to arrange for a home sleep study ? ?Obesity. ?- discussed how weight can impact sleep and risk for sleep disordered breathing ?- discussed options to assist with weight loss: combination of diet modification, cardiovascular and strength training exercises ? ?Cardiovascular risk. ?- had an extensive discussion regarding the adverse health consequences related to untreated sleep disordered breathing ?- specifically discussed the risks for hypertension, coronary artery disease, cardiac dysrhythmias, cerebrovascular disease, and diabetes ?- lifestyle modification discussed ? ?Safe driving practices. ?- discussed how sleep disruption can increase risk of accidents, particularly when driving ?- safe driving practices were discussed ? ?Therapies for obstructive sleep apnea. ?- if the sleep study shows significant sleep apnea, then various therapies for treatment were reviewed: CPAP, oral appliance, and surgical interventions ? ?Time Spent Involved in Patient  Care on Day of Examination:  ?37 minutes ? ?Follow up:  ? ?Patient Instructions  ?Will arrange for home sleep study ?Will call to arrange for follow up after sleep study reviewed ? ? ?Medication List:  ? ?Allergies as of 09/18/2021   ? ?   Reactions  ? Ace Inhibitors   ? Cough  ? ?  ? ?  ?Medication List  ?  ? ?  ? Accurate as of September 18, 2021  4:01 PM. If you have any questions, ask your nurse or doctor.  ?  ?  ? ?  ? ?alendronate 10 MG tablet ?Commonly known as: FOSAMAX ?Take 1 tablet (10 mg total) by mouth daily  before breakfast. Take with a full glass of water on an empty stomach. ?  ?amLODipine 10 MG tablet ?Commonly known as: NORVASC ?TAKE 1 TABLET BY MOUTH DAILY ?  ?atorvastatin 40 MG tablet ?Commonly known as: LIPITOR ?TAKE 1 TABLET BY MOUTH DAILY ?  ?benzonatate 100 MG capsule ?Commonly known as: TESSALON ?Take 1 capsule (100 mg total) by mouth 3 (three) times daily as needed. ?  ?Diclofenac Sodium 2 % Soln ?Commonly known as: Pennsaid ?Place 1 application onto the skin 2 (two) times daily. ?  ?dicyclomine 20 MG tablet ?Commonly known as: BENTYL ?Take 1 tablet (20 mg total) by mouth 2 (two) times daily as needed for spasms. ?  ?diphenhydramine-acetaminophen 25-500 MG Tabs tablet ?Commonly known as: TYLENOL PM ?Take 2 tablets by mouth every 4 (four) hours as needed (pain). ?  ?Fluad Quadrivalent 0.5 ML injection ?Generic drug: influenza vaccine adjuvanted ?Inject into the muscle. ?  ?fluticasone 50 MCG/ACT nasal spray ?Commonly known as: FLONASE ?Place 2 sprays into both nostrils daily. ?  ?hydrochlorothiazide 12.5 MG capsule ?Commonly known as: MICROZIDE ?TAKE 1 CAPSULE BY MOUTH DAILY ?  ?losartan 25 MG tablet ?Commonly known as: COZAAR ?TAKE 1 TABLET BY MOUTH DAILY ?  ?magnesium oxide 400 (240 Mg) MG tablet ?Commonly known as: MAG-OX ?Take 1 tablet by mouth once a day ?  ?magnesium oxide 400 MG tablet ?Commonly known as: MAG-OX ?Take 1 tablet (400 mg total) by mouth daily. ?  ?metoprolol succinate 50 MG 24 hr tablet ?Commonly known as: TOPROL-XL ?TAKE 1 TABLET BY MOUTH DAILY  WITH OR IMMEDIATELY FOLLOWING A  MEAL ?  ?Moderna COVID-19 Bival Booster 50 MCG/0.5ML injection ?Generic drug: COVID-19 mRNA bivalent vaccine (Moderna) ?Inject into the muscle. ?  ?omeprazole 20 MG capsule ?Commonly known as: PRILOSEC ?TAKE 1 CAPSULE BY MOUTH DAILY ?  ?ondansetron 4 MG disintegrating tablet ?Commonly known as: Zofran ODT ?Take 1 tablet (4 mg total) by mouth every 8 (eight) hours as needed for nausea or vomiting. ?  ?OneTouch  Delica Plus Lancet33G Misc ?USE TO TEST 1-2 TIMES DAILY ?  ?OneTouch Verio test strip ?Generic drug: glucose blood ?Check blood sugar 1-2 times daily ?  ?Trulicity 0.75 MG/0.5ML Sopn ?Generic drug: Dulaglutide ?INJECT THE CONTENTS OF 1  PEN SUBCUTANEOUSLY ONCE  WEEKLY ?  ? ?  ? ? ?Signature:  ?Coralyn Helling, MD ?[facility] ?Pager - [phone] ?09/18/2021, 4:01 PM ?  ? ? ? ? ? ? ? ? ?

## 2021-09-18 NOTE — Patient Instructions (Signed)
Will arrange for home sleep study Will call to arrange for follow up after sleep study reviewed  

## 2021-09-20 ENCOUNTER — Other Ambulatory Visit (HOSPITAL_BASED_OUTPATIENT_CLINIC_OR_DEPARTMENT_OTHER): Payer: Self-pay

## 2021-09-20 MED ORDER — AMOXICILLIN 500 MG PO CAPS
500.0000 mg | ORAL_CAPSULE | Freq: Three times a day (TID) | ORAL | 0 refills | Status: DC
  Filled 2021-09-20: qty 21, 7d supply, fill #0

## 2021-09-25 ENCOUNTER — Ambulatory Visit (HOSPITAL_BASED_OUTPATIENT_CLINIC_OR_DEPARTMENT_OTHER)
Admission: RE | Admit: 2021-09-25 | Discharge: 2021-09-25 | Disposition: A | Discharge status: Home / Self Care | Payer: Medicare Other | Source: Ambulatory Visit | Attending: Medical | Admitting: Medical

## 2021-09-25 ENCOUNTER — Other Ambulatory Visit: Payer: Self-pay

## 2021-09-25 ENCOUNTER — Encounter (HOSPITAL_BASED_OUTPATIENT_CLINIC_OR_DEPARTMENT_OTHER): Payer: Self-pay

## 2021-09-25 DIAGNOSIS — Z1231 Encounter for screening mammogram for malignant neoplasm of breast: Secondary | ICD-10-CM | POA: Diagnosis not present

## 2021-10-25 ENCOUNTER — Encounter: Payer: Self-pay | Admitting: Pulmonary Disease

## 2021-11-07 ENCOUNTER — Ambulatory Visit (HOSPITAL_BASED_OUTPATIENT_CLINIC_OR_DEPARTMENT_OTHER)
Admission: RE | Admit: 2021-11-07 | Discharge: 2021-11-07 | Disposition: A | Discharge status: Home / Self Care | Payer: Medicare Other | Source: Ambulatory Visit | Attending: Medical | Admitting: Medical

## 2021-11-07 ENCOUNTER — Other Ambulatory Visit (HOSPITAL_BASED_OUTPATIENT_CLINIC_OR_DEPARTMENT_OTHER): Payer: Self-pay

## 2021-11-07 ENCOUNTER — Ambulatory Visit: Payer: Medicare Other | Admitting: Medical

## 2021-11-07 VITALS — BP 139/69 | HR 63 | Temp 98.5°F | Resp 18 | Ht 62.5 in | Wt 183.4 lb

## 2021-11-07 DIAGNOSIS — M2550 Pain in unspecified joint: Secondary | ICD-10-CM

## 2021-11-07 DIAGNOSIS — R768 Other specified abnormal immunological findings in serum: Secondary | ICD-10-CM

## 2021-11-07 DIAGNOSIS — G8929 Other chronic pain: Secondary | ICD-10-CM | POA: Diagnosis present

## 2021-11-07 DIAGNOSIS — M7731 Calcaneal spur, right foot: Secondary | ICD-10-CM | POA: Diagnosis not present

## 2021-11-07 DIAGNOSIS — I10 Essential (primary) hypertension: Secondary | ICD-10-CM | POA: Diagnosis not present

## 2021-11-07 DIAGNOSIS — M79671 Pain in right foot: Secondary | ICD-10-CM | POA: Diagnosis present

## 2021-11-07 LAB — URIC ACID: Uric Acid, Serum: 8.7 mg/dL — ABNORMAL HIGH (ref 2.4–7.0)

## 2021-11-07 LAB — C-REACTIVE PROTEIN: CRP: <1 mg/dL (ref 0.5–20.0)

## 2021-11-07 LAB — SEDIMENTATION RATE: Sed Rate: 12 mm/hr (ref 0–30)

## 2021-11-07 MED ORDER — COLCHICINE 0.6 MG PO TABS
0.6000 mg | ORAL_TABLET | Freq: Every day | ORAL | 0 refills | Status: DC
  Filled 2021-11-07: qty 30, 30d supply, fill #0

## 2021-11-07 MED ORDER — TRAMADOL HCL 50 MG PO TABS
50.0000 mg | ORAL_TABLET | Freq: Four times a day (QID) | ORAL | 0 refills | Status: AC | PRN
  Filled 2021-11-07: qty 20, 5d supply, fill #0

## 2021-11-07 NOTE — Addendum Note (Signed)
Addended by: Gwenevere Abbot on: 11/07/2021 12:54 PM ? ? Modules accepted: Orders ? ?

## 2021-11-07 NOTE — Patient Instructions (Addendum)
Htn- bp borderline initially when checked by MA and mild higher when I checked. I think pain level 10 playing role in bp level,. Continue  losartan 25 mg daily, amlodipine 10 mg daily and hctz 12.5 mg daily. ?Check bp level at home and update me on readings. Recommend ibuprofen 2 (200 )mg tab with tylenol every 8 hours. If bp/systolic level over 150 not to use ibuprofen and notify me. ? ? ?Foot and hand pain. Ibuprofen and tylenol as described above. Tramadol rx for severe pan ? ?For hand pain will get inflammatory labs. Then make decision on referral to hand specialist ? ?Will get xray of rt foot and then refer to podiatrist. I do suspect that on xray will see spur. ? ?Follow up in 2 weeks or sooner if needed. ?

## 2021-11-07 NOTE — Addendum Note (Signed)
Addended by: Gwenevere Abbot on: 11/07/2021 05:13 PM ? ? Modules accepted: Orders ? ?

## 2021-11-07 NOTE — Addendum Note (Signed)
Addended by: Gwenevere Abbot on: 11/07/2021 11:49 AM ? ? Modules accepted: Orders ? ?

## 2021-11-07 NOTE — Progress Notes (Signed)
? ?Subjective:  ? ? Patient ID: Olivia Morales, female    DOB: May 02, 1951, 71 y.o.   MRN: [id number] ? ?HPI ? ?Pt in for rt foot pain. Pain is in heel area. Pain is sharp and feel like nail in her foot. Pt has been using pain cream on her foot. Has been using pnnsaid 2% soln. Pt also tried alleve and she stats will get some relief. Pt was using her boyfriend tramadol recently. Pt states pain level presently 10. ? ?Pt also describes her rt hand finger trigger she has to extend them. This has been going on for 2-3 months when she sleeps. ? ? ?Htn- bp mild high on initial check. On losartan 25 mg daily, amlodipine 10 mg daily and hctz 12.5 mg daily. ? ? ? ? ?Review of Systems  ?Constitutional:  Negative for chills, fatigue and fever.  ?Respiratory:  Negative for cough, chest tightness, shortness of breath and wheezing.   ?Cardiovascular:  Negative for chest pain and palpitations.  ?Gastrointestinal:  Negative for abdominal pain.  ?Musculoskeletal:   ?     Rt foot pain. Rt heel pain.  ?Neurological:  Negative for dizziness, light-headedness and headaches.  ?Hematological:  Negative for adenopathy. Does not bruise/bleed easily.  ?Psychiatric/Behavioral:  Negative for behavioral problems, confusion and decreased concentration.   ? ? ?Past Medical History:  ?Diagnosis Date  ? Diabetes mellitus   ? Hypercholesteremia   ? Hypertension   ? Sleep apnea   ? ?  ?Social History  ? ?Socioeconomic History  ? Marital status: Widowed  ?  Spouse name: Not on file  ? Number of children: Not on file  ? Years of education: Not on file  ? Highest education level: Not on file  ?Occupational History  ? Not on file  ?Tobacco Use  ? Smoking status: Never  ? Smokeless tobacco: Never  ?Vaping Use  ? Vaping Use: Never used  ?Substance and Sexual Activity  ? Alcohol use: Yes  ?  Comment: weekly  ? Drug use: No  ? Sexual activity: Not on file  ?Other Topics Concern  ? Not on file  ?Social History Narrative  ? Not on file  ? ?Social  Determinants of Health  ? ?Financial Resource Strain: Low Risk   ? Difficulty of Paying Living Expenses: Not hard at all  ?Food Insecurity: No Food Insecurity  ? Worried About Programme researcher, broadcasting/film/videounning Out of Food in the Last Year: Never true  ? Ran Out of Food in the Last Year: Never true  ?Transportation Needs: No Transportation Needs  ? Lack of Transportation (Medical): No  ? Lack of Transportation (Non-Medical): No  ?Physical Activity: Inactive  ? Days of Exercise per Week: 0 days  ? Minutes of Exercise per Session: 0 min  ?Stress: No Stress Concern Present  ? Feeling of Stress : Not at all  ?Social Connections: Moderately Integrated  ? Frequency of Communication with Friends and Family: More than three times a week  ? Frequency of Social Gatherings with Friends and Family: More than three times a week  ? Attends Religious Services: More than 4 times per year  ? Active Member of Clubs or Organizations: Yes  ? Attends BankerClub or Organization Meetings: More than 4 times per year  ? Marital Status: Widowed  ?Intimate Partner Violence: Not At Risk  ? Fear of Current or Ex-Partner: No  ? Emotionally Abused: No  ? Physically Abused: No  ? Sexually Abused: No  ? ? ?Past Surgical History:  ?Procedure  Laterality Date  ? TUBAL LIGATION    ? ? ?Family History  ?Problem Relation Age of Onset  ? Sickle cell anemia Father   ? ? ?Allergies  ?Allergen Reactions  ? Ace Inhibitors   ?  Cough  ? ? ?Current Outpatient Medications on File Prior to Visit  ?Medication Sig Dispense Refill  ? alendronate (FOSAMAX) 10 MG tablet Take 1 tablet (10 mg total) by mouth daily before breakfast. Take with a full glass of water on an empty stomach. 90 tablet 3  ? amLODipine (NORVASC) 10 MG tablet TAKE 1 TABLET BY MOUTH DAILY 100 tablet 2  ? amoxicillin (AMOXIL) 500 MG capsule Take 1 capsule (500 mg total) by mouth 3 (three) times daily until finished. 21 capsule 0  ? atorvastatin (LIPITOR) 40 MG tablet TAKE 1 TABLET BY MOUTH DAILY 100 tablet 2  ? benzonatate  (TESSALON) 100 MG capsule Take 1 capsule (100 mg total) by mouth 3 (three) times daily as needed. (Patient not taking: Reported on 09/18/2021) 30 capsule 0  ? COVID-19 mRNA bivalent vaccine, Moderna, (MODERNA COVID-19 BIVAL BOOSTER) 50 MCG/0.5ML injection Inject into the muscle. 0.5 mL 0  ? Diclofenac Sodium (PENNSAID) 2 % SOLN Place 1 application onto the skin 2 (two) times daily. (Patient not taking: Reported on 09/18/2021) 112 g 2  ? dicyclomine (BENTYL) 20 MG tablet Take 1 tablet (20 mg total) by mouth 2 (two) times daily as needed for spasms. (Patient not taking: Reported on 09/18/2021) 10 tablet 0  ? diphenhydramine-acetaminophen (TYLENOL PM) 25-500 MG TABS Take 2 tablets by mouth every 4 (four) hours as needed (pain). (Patient not taking: Reported on 09/18/2021)    ? fluticasone (FLONASE) 50 MCG/ACT nasal spray Place 2 sprays into both nostrils daily. (Patient not taking: Reported on 09/18/2021) 16 g 1  ? hydrochlorothiazide (MICROZIDE) 12.5 MG capsule TAKE 1 CAPSULE BY MOUTH DAILY 100 capsule 2  ? influenza vaccine adjuvanted (FLUAD) 0.5 ML injection Inject into the muscle. 0.5 mL 0  ? Lancets (ONETOUCH DELICA PLUS LANCET33G) MISC USE TO TEST 1-2 TIMES DAILY (Patient not taking: Reported on 09/18/2021) 100 each 0  ? losartan (COZAAR) 25 MG tablet TAKE 1 TABLET BY MOUTH DAILY 100 tablet 2  ? magnesium oxide (MAG-OX) 400 (240 Mg) MG tablet Take 1 tablet by mouth once a day 120 tablet 5  ? magnesium oxide (MAG-OX) 400 MG tablet Take 1 tablet (400 mg total) by mouth daily. 30 tablet 5  ? metoprolol succinate (TOPROL-XL) 50 MG 24 hr tablet TAKE 1 TABLET BY MOUTH DAILY  WITH OR IMMEDIATELY FOLLOWING A  MEAL (Patient not taking: Reported on 09/18/2021) 100 tablet 2  ? omeprazole (PRILOSEC) 20 MG capsule TAKE 1 CAPSULE BY MOUTH DAILY 100 capsule 2  ? ondansetron (ZOFRAN ODT) 4 MG disintegrating tablet Take 1 tablet (4 mg total) by mouth every 8 (eight) hours as needed for nausea or vomiting. (Patient not taking: Reported  on 09/18/2021) 15 tablet 0  ? ONETOUCH VERIO test strip Check blood sugar 1-2 times daily (Patient not taking: Reported on 09/18/2021) 100 each 12  ? TRULICITY 0.75 MG/0.5ML SOPN INJECT THE CONTENTS OF 1  PEN SUBCUTANEOUSLY ONCE  WEEKLY 6 mL 3  ? ?No current facility-administered medications on file prior to visit.  ? ? ?BP 139/69   Pulse 63   Temp 98.5 ?F (36.9 ?C)   Resp 18   Ht 5' 2.5" (1.588 m)   Wt 183 lb 6.4 oz (83.2 kg)   SpO2 100%  BMI 33.01 kg/m?  ?  ?   ?Objective:  ? Physical Exam ? ? ? ? ?   ?Assessment & Plan:  ? ?Patient Instructions  ?Htn- bp borderline initially when checked by MA and mild higher when I checked. I think pain level 10 playing role in bp level,. Continue  losartan 25 mg daily, amlodipine 10 mg daily and hctz 12.5 mg daily. ?Check bp level at home and update me on readings. Recommend ibuprofen 2 (200 )mg tab with tylenol every 8 hours. If bp/systolic level over 150 not to use ibuprofen and notify me. ? ? ?Foot and hand pain. Ibuprofen and tylenol as described above. Tramadol rx for severe pan ? ?For hand pain will get inflammatory labs. Then make decision on referral to hand specialist ? ?Will get xray of rt foot and then refer to podiatrist. I do suspect that on xray will see spurr. ? ?Follow up in 2 weeks or sooner if needed.  ? ?Esperanza Richters, PA-C  ? ?

## 2021-11-08 ENCOUNTER — Other Ambulatory Visit (HOSPITAL_BASED_OUTPATIENT_CLINIC_OR_DEPARTMENT_OTHER): Payer: Self-pay

## 2021-11-10 LAB — RHEUMATOID FACTOR: Rheumatoid fact SerPl-aCnc: 15 IU/mL — ABNORMAL HIGH (ref <14)

## 2021-11-10 LAB — ANTI-NUCLEAR AB-TITER (ANA TITER)
ANA TITER: 1:40 {titer} — ABNORMAL HIGH
ANA Titer 1: 1:320 {titer} — ABNORMAL HIGH

## 2021-11-10 LAB — ANA: Anti Nuclear Antibody (ANA): POSITIVE — AB

## 2021-11-11 NOTE — Addendum Note (Signed)
Addended by: Anabel Halon on: 11/11/2021 01:50 PM ? ? Modules accepted: Orders ? ?

## 2021-11-13 ENCOUNTER — Other Ambulatory Visit (HOSPITAL_BASED_OUTPATIENT_CLINIC_OR_DEPARTMENT_OTHER): Payer: Self-pay

## 2021-11-13 MED ORDER — MELOXICAM 15 MG PO TABS
ORAL_TABLET | ORAL | 2 refills | Status: DC
Start: 2021-11-13 — End: 2022-03-14
  Filled 2021-11-13: qty 30, 30d supply, fill #0
  Filled 2022-01-01: qty 30, 30d supply, fill #1
  Filled 2022-02-08: qty 30, 30d supply, fill #2

## 2021-11-14 ENCOUNTER — Telehealth: Payer: Self-pay | Admitting: Medical

## 2021-11-14 NOTE — Telephone Encounter (Signed)
Pt states number given to Rheumatology Office is needing a referral placed. She also would rather go somewhere [facility] affiliated as she does not like [facility]. Please advise.  ?

## 2021-11-28 ENCOUNTER — Telehealth: Payer: Self-pay | Admitting: Medical

## 2021-11-28 ENCOUNTER — Other Ambulatory Visit (HOSPITAL_BASED_OUTPATIENT_CLINIC_OR_DEPARTMENT_OTHER): Payer: Self-pay

## 2021-11-28 NOTE — Telephone Encounter (Signed)
Patient would to speak with someone to discuss medications.. magnesium oxide and vitamin d3. Please advise

## 2021-11-30 ENCOUNTER — Other Ambulatory Visit (HOSPITAL_BASED_OUTPATIENT_CLINIC_OR_DEPARTMENT_OTHER): Payer: Self-pay

## 2021-12-12 ENCOUNTER — Other Ambulatory Visit (HOSPITAL_BASED_OUTPATIENT_CLINIC_OR_DEPARTMENT_OTHER): Payer: Self-pay

## 2021-12-21 DIAGNOSIS — G4733 Obstructive sleep apnea (adult) (pediatric): Secondary | ICD-10-CM | POA: Insufficient documentation

## 2021-12-29 ENCOUNTER — Telehealth: Payer: Self-pay | Admitting: Medical

## 2021-12-29 DIAGNOSIS — Z1211 Encounter for screening for malignant neoplasm of colon: Secondary | ICD-10-CM

## 2021-12-29 NOTE — Telephone Encounter (Signed)
Referral placed.

## 2021-12-29 NOTE — Telephone Encounter (Signed)
Pt called stating she would like a referral be put in for her to have a colonoscopy.

## 2022-01-01 ENCOUNTER — Other Ambulatory Visit (HOSPITAL_BASED_OUTPATIENT_CLINIC_OR_DEPARTMENT_OTHER): Payer: Self-pay

## 2022-01-08 ENCOUNTER — Other Ambulatory Visit: Payer: Self-pay | Admitting: Medical

## 2022-01-08 NOTE — Telephone Encounter (Signed)
Pt stated referral does not have the order. Please resend referral.

## 2022-01-08 NOTE — Telephone Encounter (Signed)
Can you resend referral place

## 2022-01-10 ENCOUNTER — Ambulatory Visit: Payer: Medicare Other

## 2022-02-08 ENCOUNTER — Other Ambulatory Visit (HOSPITAL_BASED_OUTPATIENT_CLINIC_OR_DEPARTMENT_OTHER): Payer: Self-pay

## 2022-02-09 ENCOUNTER — Other Ambulatory Visit (HOSPITAL_BASED_OUTPATIENT_CLINIC_OR_DEPARTMENT_OTHER): Payer: Self-pay

## 2022-02-26 ENCOUNTER — Ambulatory Visit: Payer: Medicare Other | Admitting: Medical

## 2022-02-26 ENCOUNTER — Encounter: Payer: Self-pay | Admitting: Medical

## 2022-02-26 VITALS — BP 149/80 | HR 65 | Temp 98.2°F | Resp 18 | Ht 62.5 in | Wt 180.4 lb

## 2022-02-26 DIAGNOSIS — I10 Essential (primary) hypertension: Secondary | ICD-10-CM | POA: Diagnosis not present

## 2022-02-26 DIAGNOSIS — E1165 Type 2 diabetes mellitus with hyperglycemia: Secondary | ICD-10-CM

## 2022-02-26 DIAGNOSIS — E785 Hyperlipidemia, unspecified: Secondary | ICD-10-CM

## 2022-02-26 DIAGNOSIS — Z1211 Encounter for screening for malignant neoplasm of colon: Secondary | ICD-10-CM

## 2022-02-26 DIAGNOSIS — E1169 Type 2 diabetes mellitus with other specified complication: Secondary | ICD-10-CM | POA: Diagnosis not present

## 2022-02-26 DIAGNOSIS — K635 Polyp of colon: Secondary | ICD-10-CM

## 2022-02-26 NOTE — Progress Notes (Signed)
Subjective:    Patient ID: Olivia Morales, female    DOB: [birth year], 71 y.o.   MRN: [id number]  HPI   Htn- bp mild high on initial check. She has not been checking at home.  On losartan 25 mg daily, amlodipine 10 mg daily and hctz 12.5 mg daily. She did take her meds today.  Diabetes- on  Trulicity and we will see if any dose changes need to be made.  Continue low sugar diet.   Hyperlipidemia-continue atorvastatin.  Placing future lipid panel to be done hopefully this week early morning.  Please get scheduled on the way out.   GERD-continue to eat healthy and use omeprazole if needed.  Recently reported eating better and stomach feels better.  This was 1 medication that she might be able to drop.   Unstable gait for one month on standing. She states occurs twice a day but gains balance easily. No falls.   Review of Systems  Constitutional:  Negative for chills, fatigue and fever.  HENT:  Negative for congestion.   Respiratory:  Negative for cough, chest tightness, shortness of breath and wheezing.   Cardiovascular:  Negative for chest pain and palpitations.  Gastrointestinal:  Negative for abdominal pain, constipation and diarrhea.  Musculoskeletal:  Negative for back pain.  Skin:  Negative for rash.  Neurological:  Negative for dizziness, syncope, weakness, light-headedness, numbness and headaches.       Unstable gain on standing 2-3 times a week.  Hematological:  Negative for adenopathy. Does not bruise/bleed easily.  Psychiatric/Behavioral:  Negative for behavioral problems, confusion and sleep disturbance. The patient is not nervous/anxious.      Past Medical History:  Diagnosis Date   Diabetes mellitus    Hypercholesteremia    Hypertension    Sleep apnea      Social History   Socioeconomic History   Marital status: Widowed    Spouse name: Not on file   Number of children: Not on file   Years of education: Not on file   Highest education level: Not on  file  Occupational History   Not on file  Tobacco Use   Smoking status: Never   Smokeless tobacco: Never  Vaping Use   Vaping Use: Never used  Substance and Sexual Activity   Alcohol use: Yes    Comment: weekly   Drug use: No   Sexual activity: Not on file  Other Topics Concern   Not on file  Social History Narrative   Not on file   Social Determinants of Health   Financial Resource Strain: Low Risk  (06/05/2021)   Overall Financial Resource Strain (CARDIA)    Difficulty of Paying Living Expenses: Not hard at all  Food Insecurity: No Food Insecurity (06/05/2021)   Hunger Vital Sign    Worried About Running Out of Food in the Last Year: Never true    Ran Out of Food in the Last Year: Never true  Transportation Needs: No Transportation Needs (06/05/2021)   PRAPARE - Administrator, Civil Service (Medical): No    Lack of Transportation (Non-Medical): No  Physical Activity: Inactive (06/05/2021)   Exercise Vital Sign    Days of Exercise per Week: 0 days    Minutes of Exercise per Session: 0 min  Stress: No Stress Concern Present (06/05/2021)   [employer] of Occupational Health - Occupational Stress Questionnaire    Feeling of Stress : Not at all  Social Connections: Moderately Integrated (06/05/2021)   Social  Connection and Isolation Panel [NHANES]    Frequency of Communication with Friends and Family: More than three times a week    Frequency of Social Gatherings with Friends and Family: More than three times a week    Attends Religious Services: More than 4 times per year    Active Member of [employer] or Organizations: Yes    Attends Banker Meetings: More than 4 times per year    Marital Status: Widowed  Intimate Partner Violence: Not At Risk (06/05/2021)   Humiliation, Afraid, Rape, and Kick questionnaire    Fear of Current or Ex-Partner: No    Emotionally Abused: No    Physically Abused: No    Sexually Abused: No    Past Surgical History:   Procedure Laterality Date   TUBAL LIGATION      Family History  Problem Relation Age of Onset   Sickle cell anemia Father     Allergies  Allergen Reactions   Ace Inhibitors     Cough    Current Outpatient Medications on File Prior to Visit  Medication Sig Dispense Refill   alendronate (FOSAMAX) 10 MG tablet Take 1 tablet (10 mg total) by mouth daily before breakfast. Take with a full glass of water on an empty stomach. 90 tablet 3   amLODipine (NORVASC) 10 MG tablet TAKE 1 TABLET BY MOUTH DAILY 100 tablet 2   atorvastatin (LIPITOR) 40 MG tablet TAKE 1 TABLET BY MOUTH DAILY 100 tablet 2   benzonatate (TESSALON) 100 MG capsule Take 1 capsule (100 mg total) by mouth 3 (three) times daily as needed. (Patient not taking: Reported on 09/18/2021) 30 capsule 0   colchicine 0.6 MG tablet Take 1 tablet (0.6 mg total) by mouth daily. 30 tablet 0   COVID-19 mRNA bivalent vaccine, Moderna, (MODERNA COVID-19 BIVAL BOOSTER) 50 MCG/0.5ML injection Inject into the muscle. 0.5 mL 0   Diclofenac Sodium (PENNSAID) 2 % SOLN Place 1 application onto the skin 2 (two) times daily. (Patient not taking: Reported on 09/18/2021) 112 g 2   dicyclomine (BENTYL) 20 MG tablet Take 1 tablet (20 mg total) by mouth 2 (two) times daily as needed for spasms. (Patient not taking: Reported on 09/18/2021) 10 tablet 0   diphenhydramine-acetaminophen (TYLENOL PM) 25-500 MG TABS Take 2 tablets by mouth every 4 (four) hours as needed (pain). (Patient not taking: Reported on 09/18/2021)     Dulaglutide (TRULICITY) 0.75 MG/0.5ML SOPN INJECT THE CONTENTS OF ONE  PEN SUBCUTANEOUSLY WEEKLY  AS DIRECTED 6 mL 3   fluticasone (FLONASE) 50 MCG/ACT nasal spray Place 2 sprays into both nostrils daily. (Patient not taking: Reported on 09/18/2021) 16 g 1   hydrochlorothiazide (MICROZIDE) 12.5 MG capsule TAKE 1 CAPSULE BY MOUTH DAILY 100 capsule 2   influenza vaccine adjuvanted (FLUAD) 0.5 ML injection Inject into the muscle. 0.5 mL 0   Lancets  (ONETOUCH DELICA PLUS LANCET33G) MISC USE TO TEST 1-2 TIMES DAILY (Patient not taking: Reported on 09/18/2021) 100 each 0   losartan (COZAAR) 25 MG tablet TAKE 1 TABLET BY MOUTH DAILY 100 tablet 2   magnesium oxide (MAG-OX) 400 (240 Mg) MG tablet Take 1 tablet by mouth once a day 120 tablet 5   magnesium oxide (MAG-OX) 400 MG tablet Take 1 tablet (400 mg total) by mouth daily. 30 tablet 5   meloxicam (MOBIC) 15 MG tablet Take 1 tablet by mouth once daily 30 tablet 2   metoprolol succinate (TOPROL-XL) 50 MG 24 hr tablet TAKE 1 TABLET BY  MOUTH DAILY  WITH OR IMMEDIATELY FOLLOWING A  MEAL (Patient not taking: Reported on 09/18/2021) 100 tablet 2   omeprazole (PRILOSEC) 20 MG capsule TAKE 1 CAPSULE BY MOUTH DAILY 100 capsule 2   ondansetron (ZOFRAN ODT) 4 MG disintegrating tablet Take 1 tablet (4 mg total) by mouth every 8 (eight) hours as needed for nausea or vomiting. (Patient not taking: Reported on 09/18/2021) 15 tablet 0   ONETOUCH VERIO test strip Check blood sugar 1-2 times daily (Patient not taking: Reported on 09/18/2021) 100 each 12   No current facility-administered medications on file prior to visit.    BP (!) 149/67   Pulse 65   Temp 98.2 F (36.8 C)   Resp 18   Ht 5' 2.5" (1.588 m)   Wt 180 lb 6.4 oz (81.8 kg)   SpO2 98%   BMI 32.47 kg/m            Objective:   Physical Exam  General Mental Status- Alert. General Appearance- Not in acute distress.   Skin General: Color- Normal Color. Moisture- Normal Moisture.  Neck Carotid Arteries- Normal color. Moisture- Normal Moisture. No carotid bruits. No JVD.  Chest and Lung Exam Auscultation: Breath Sounds:-Normal.  Cardiovascular Auscultation:Rythm- Regular. Murmurs & Other Heart Sounds:Auscultation of the heart reveals- No Murmurs.  Abdomen Inspection:-Inspeection Normal. Palpation/Percussion:Note:No mass. Palpation and Percussion of the abdomen reveal- Non Tender, Non Distended + BS, no rebound or  guarding.   Neurologic Cranial Nerve exam:- CN III-XII intact(No nystagmus), symmetric smile. Strength:- 5/5 equal and symmetric strength both upper and lower extremities.       Assessment & Plan:   Patient Instructions   Htn- losartan 25 mg daily, amlodipine 10 mg daily and hctz 12.5 mg daily. Bp elevated today. Check bp daily in am seated. Update me in one week on bp reading.  Gait disturbance some off balance on standing. Get balance before ambulating. When you do stand and feel off balance. Check bp as well. Write those readings down and update me in one week as well.  May add on bp med or increase dose but some concern of postural hypotension so want to review bp readings first.  Diabetes. Continue trulicity. Get future cmp and a1c this week..  For high cholesterol get future lipid as well fasting.  Placed referral to gi for colonoscopy.   Follow up date to be determined after lab review and after bp update.   Esperanza Richters, PA-C

## 2022-02-26 NOTE — Patient Instructions (Addendum)
Htn- losartan 25 mg daily, amlodipine 10 mg daily and hctz 12.5 mg daily. Bp elevated today. Check bp daily in am seated. Update me in one week on bp reading.  Gait disturbance some off balance on standing. Get balance before ambulating. When you do stand and feel off balance. Check bp as well. Write those readings down and update me in one week as well.  May add on bp med or increase dose but some concern of postural hypotension so want to review bp readings first.  Diabetes. Continue trulicity. Get future cmp and a1c this week..  For high cholesterol get future lipid as well fasting.  Placed referral to gi for colonoscopy.   Follow up date to be determined after lab review and after bp update.

## 2022-02-28 ENCOUNTER — Ambulatory Visit: Payer: Medicare Other | Admitting: Internal Medicine

## 2022-03-14 ENCOUNTER — Other Ambulatory Visit (HOSPITAL_BASED_OUTPATIENT_CLINIC_OR_DEPARTMENT_OTHER): Payer: Self-pay

## 2022-03-14 MED ORDER — MELOXICAM 15 MG PO TABS
15.0000 mg | ORAL_TABLET | Freq: Every day | ORAL | 3 refills | Status: DC
  Filled 2022-03-14 – 2022-03-27 (×2): qty 30, 30d supply, fill #0
  Filled 2022-05-14: qty 30, 30d supply, fill #1
  Filled 2022-07-17: qty 30, 30d supply, fill #2
  Filled 2022-09-04 – 2022-09-05 (×2): qty 30, 30d supply, fill #3
  Filled 2022-11-08: qty 30, 30d supply, fill #4

## 2022-03-23 ENCOUNTER — Other Ambulatory Visit (HOSPITAL_BASED_OUTPATIENT_CLINIC_OR_DEPARTMENT_OTHER): Payer: Self-pay

## 2022-03-27 ENCOUNTER — Other Ambulatory Visit (HOSPITAL_BASED_OUTPATIENT_CLINIC_OR_DEPARTMENT_OTHER): Payer: Self-pay

## 2022-03-29 LAB — HM DIABETES EYE EXAM

## 2022-04-10 ENCOUNTER — Other Ambulatory Visit (HOSPITAL_COMMUNITY): Payer: Self-pay

## 2022-04-10 ENCOUNTER — Other Ambulatory Visit (HOSPITAL_BASED_OUTPATIENT_CLINIC_OR_DEPARTMENT_OTHER): Payer: Self-pay

## 2022-04-10 ENCOUNTER — Other Ambulatory Visit: Payer: Self-pay | Admitting: Medical

## 2022-04-10 NOTE — Telephone Encounter (Signed)
Refill request for tramadol, no longer on med list.

## 2022-04-11 ENCOUNTER — Other Ambulatory Visit (HOSPITAL_BASED_OUTPATIENT_CLINIC_OR_DEPARTMENT_OTHER): Payer: Self-pay

## 2022-05-14 ENCOUNTER — Other Ambulatory Visit (HOSPITAL_BASED_OUTPATIENT_CLINIC_OR_DEPARTMENT_OTHER): Payer: Self-pay

## 2022-05-14 ENCOUNTER — Telehealth: Payer: Self-pay | Admitting: Medical

## 2022-05-14 MED ORDER — TRULICITY 0.75 MG/0.5ML ~~LOC~~ SOAJ
0.7500 mg | SUBCUTANEOUS | 3 refills | Status: DC
  Filled 2022-05-14: qty 2, 28d supply, fill #0

## 2022-05-14 NOTE — Telephone Encounter (Signed)
Medication: TRULICITY  Has the patient contacted their pharmacy? Yes.     Preferred Pharmacy:    [facility] [place] [facility] [address] [place] [address] Phone: [phone]  Fax: [phone]

## 2022-05-14 NOTE — Telephone Encounter (Signed)
Rx sent 

## 2022-07-05 ENCOUNTER — Ambulatory Visit: Payer: Medicare Other | Admitting: Medical

## 2022-07-23 ENCOUNTER — Other Ambulatory Visit: Payer: Self-pay | Admitting: Medical

## 2022-08-07 ENCOUNTER — Other Ambulatory Visit (HOSPITAL_BASED_OUTPATIENT_CLINIC_OR_DEPARTMENT_OTHER): Payer: Self-pay

## 2022-08-07 ENCOUNTER — Ambulatory Visit: Payer: Medicare Other | Admitting: Medical

## 2022-08-07 VITALS — BP 156/76 | HR 58 | Temp 98.2°F | Resp 18 | Ht 62.5 in | Wt 169.0 lb

## 2022-08-07 DIAGNOSIS — R059 Cough, unspecified: Secondary | ICD-10-CM

## 2022-08-07 DIAGNOSIS — J0100 Acute maxillary sinusitis, unspecified: Secondary | ICD-10-CM | POA: Diagnosis not present

## 2022-08-07 DIAGNOSIS — U071 COVID-19: Secondary | ICD-10-CM | POA: Diagnosis not present

## 2022-08-07 DIAGNOSIS — E785 Hyperlipidemia, unspecified: Secondary | ICD-10-CM

## 2022-08-07 DIAGNOSIS — E1159 Type 2 diabetes mellitus with other circulatory complications: Secondary | ICD-10-CM

## 2022-08-07 DIAGNOSIS — I152 Hypertension secondary to endocrine disorders: Secondary | ICD-10-CM

## 2022-08-07 DIAGNOSIS — E1165 Type 2 diabetes mellitus with hyperglycemia: Secondary | ICD-10-CM

## 2022-08-07 DIAGNOSIS — E1169 Type 2 diabetes mellitus with other specified complication: Secondary | ICD-10-CM

## 2022-08-07 LAB — POCT INFLUENZA A/B
Influenza A, POC: NEGATIVE
Influenza B, POC: NEGATIVE

## 2022-08-07 LAB — POC COVID19 BINAXNOW: SARS Coronavirus 2 Ag: POSITIVE — AB

## 2022-08-07 MED ORDER — FLUTICASONE PROPIONATE 50 MCG/ACT NA SUSP
2.0000 | Freq: Every day | NASAL | 1 refills | Status: DC
  Filled 2022-08-07: qty 16, 30d supply, fill #0

## 2022-08-07 MED ORDER — AZITHROMYCIN 250 MG PO TABS
ORAL_TABLET | ORAL | 0 refills | Status: AC
  Filled 2022-08-07: qty 6, 5d supply, fill #0

## 2022-08-07 MED ORDER — BENZONATATE 100 MG PO CAPS
100.0000 mg | ORAL_CAPSULE | Freq: Three times a day (TID) | ORAL | 0 refills | Status: DC | PRN
  Filled 2022-08-07: qty 30, 10d supply, fill #0

## 2022-08-07 NOTE — Progress Notes (Signed)
Subjective:    Patient ID: Olivia Morales, female    DOB: 1951-06-09, 72 y.o.   MRN: [id number]  HPI Pt in for sneezing, coughing, spitting up phlem, decreased, body aches and fatigue. Symptoms started last Wednesday.  Pt states her grandson had a cold. She does have sinus pain and couging up some mucus.      Review of Systems  Constitutional:  Negative for chills, diaphoresis, fatigue and fever.  HENT:  Positive for congestion, sinus pressure and sinus pain.   Respiratory:  Positive for cough. Negative for chest tightness, shortness of breath and wheezing.   Cardiovascular:  Negative for chest pain and palpitations.  Gastrointestinal:  Negative for abdominal pain and blood in stool.  Genitourinary:  Negative for dyspareunia and dysuria.  Musculoskeletal:  Positive for myalgias. Negative for back pain.  Neurological:  Negative for facial asymmetry, weakness, numbness and headaches.    Past Medical History:  Diagnosis Date   Diabetes mellitus    Hypercholesteremia    Hypertension    Sleep apnea      Social History   Socioeconomic History   Marital status: Widowed    Spouse name: Not on file   Number of children: Not on file   Years of education: Not on file   Highest education level: Not on file  Occupational History   Not on file  Tobacco Use   Smoking status: Never   Smokeless tobacco: Never  Vaping Use   Vaping Use: Never used  Substance and Sexual Activity   Alcohol use: Yes    Comment: weekly   Drug use: No   Sexual activity: Not on file  Other Topics Concern   Not on file  Social History Narrative   Not on file   Social Determinants of Health   Financial Resource Strain: Low Risk  (06/05/2021)   Overall Financial Resource Strain (CARDIA)    Difficulty of Paying Living Expenses: Not hard at all  Food Insecurity: No Food Insecurity (06/05/2021)   Hunger Vital Sign    Worried About Running Out of Food in the Last Year: Never true    [place] in the Last Year: Never true  Transportation Needs: No Transportation Needs (06/05/2021)   PRAPARE - Hydrologist (Medical): No    Lack of Transportation (Non-Medical): No  Physical Activity: Inactive (06/05/2021)   Exercise Vital Sign    Days of Exercise per Week: 0 days    Minutes of Exercise per Session: 0 min  Stress: No Stress Concern Present (06/05/2021)   [place]    Feeling of Stress : Not at all  Social Connections: Moderately Integrated (06/05/2021)   Social Connection and Isolation Panel [NHANES]    Frequency of Communication with Friends and Family: More than three times a week    Frequency of Social Gatherings with Friends and Family: More than three times a week    Attends Religious Services: More than 4 times per year    Active Member of [employer] or Organizations: Yes    Attends Archivist Meetings: More than 4 times per year    Marital Status: Widowed  Intimate Partner Violence: Not At Risk (06/05/2021)   Humiliation, Afraid, Rape, and Kick questionnaire    Fear of Current or Ex-Partner: No    Emotionally Abused: No    Physically Abused: No    Sexually Abused: No    Past Surgical  History:  Procedure Laterality Date   TUBAL LIGATION      Family History  Problem Relation Age of Onset   Sickle cell anemia Father     Allergies  Allergen Reactions   Ace Inhibitors     Cough    Current Outpatient Medications on File Prior to Visit  Medication Sig Dispense Refill   alendronate (FOSAMAX) 10 MG tablet Take 1 tablet (10 mg total) by mouth daily before breakfast. Take with a full glass of water on an empty stomach. 90 tablet 3   amLODipine (NORVASC) 10 MG tablet TAKE 1 TABLET BY MOUTH DAILY 100 tablet 2   atorvastatin (LIPITOR) 40 MG tablet TAKE 1 TABLET BY MOUTH DAILY 100 tablet 2   benzonatate (TESSALON) 100 MG capsule Take 1 capsule (100 mg total) by mouth 3  (three) times daily as needed. (Patient not taking: Reported on 09/18/2021) 30 capsule 0   colchicine 0.6 MG tablet Take 1 tablet (0.6 mg total) by mouth daily. 30 tablet 0   COVID-19 mRNA bivalent vaccine, Moderna, (MODERNA COVID-19 BIVAL BOOSTER) 50 MCG/0.5ML injection Inject into the muscle. 0.5 mL 0   Diclofenac Sodium (PENNSAID) 2 % SOLN Place 1 application onto the skin 2 (two) times daily. (Patient not taking: Reported on 09/18/2021) 112 g 2   dicyclomine (BENTYL) 20 MG tablet Take 1 tablet (20 mg total) by mouth 2 (two) times daily as needed for spasms. (Patient not taking: Reported on 09/18/2021) 10 tablet 0   diphenhydramine-acetaminophen (TYLENOL PM) 25-500 MG TABS Take 2 tablets by mouth every 4 (four) hours as needed (pain). (Patient not taking: Reported on 09/18/2021)     Dulaglutide (TRULICITY) 8.36 OQ/9.4TM SOPN Inject 0.75 mg into the skin once a week. 6 mL 3   fluticasone (FLONASE) 50 MCG/ACT nasal spray Place 2 sprays into both nostrils daily. (Patient not taking: Reported on 09/18/2021) 16 g 1   hydrochlorothiazide (MICROZIDE) 12.5 MG capsule TAKE 1 CAPSULE BY MOUTH DAILY 100 capsule 2   influenza vaccine adjuvanted (FLUAD) 0.5 ML injection Inject into the muscle. 0.5 mL 0   Lancets (ONETOUCH DELICA PLUS LYYTKP54S) MISC USE TO TEST 1-2 TIMES DAILY (Patient not taking: Reported on 09/18/2021) 100 each 0   losartan (COZAAR) 25 MG tablet TAKE 1 TABLET BY MOUTH DAILY 100 tablet 2   magnesium oxide (MAG-OX) 400 (240 Mg) MG tablet Take 1 tablet by mouth once a day 120 tablet 5   magnesium oxide (MAG-OX) 400 MG tablet Take 1 tablet (400 mg total) by mouth daily. 30 tablet 5   meloxicam (MOBIC) 15 MG tablet Take 1 tablet (15 mg total) by mouth daily. 90 tablet 3   metoprolol succinate (TOPROL-XL) 50 MG 24 hr tablet TAKE 1 TABLET BY MOUTH DAILY  WITH OR IMMEDIATELY FOLLOWING A  MEAL 100 tablet 2   omeprazole (PRILOSEC) 20 MG capsule TAKE 1 CAPSULE BY MOUTH DAILY 100 capsule 2   ondansetron  (ZOFRAN ODT) 4 MG disintegrating tablet Take 1 tablet (4 mg total) by mouth every 8 (eight) hours as needed for nausea or vomiting. (Patient not taking: Reported on 09/18/2021) 15 tablet 0   ONETOUCH VERIO test strip Check blood sugar 1-2 times daily (Patient not taking: Reported on 09/18/2021) 100 each 12   No current facility-administered medications on file prior to visit.    BP (!) 156/76   Pulse (!) 58   Temp 98.2 F (36.8 C)   Resp 18   Ht 5' 2.5" (1.588 m)   [facility]  169 lb (76.7 kg)   SpO2 98%   BMI 30.42 kg/m        Objective:   Physical Exam  General Mental Status- Alert. General Appearance- Not in acute distress.   Skin General: Color- Normal Color. Moisture- Normal Moisture.  Neck Carotid Arteries- Normal color. Moisture- Normal Moisture. No carotid bruits. No JVD.  Chest and Lung Exam Auscultation: Breath Sounds:-Normal.  Cardiovascular Auscultation:Rythm- Regular. Murmurs & Other Heart Sounds:Auscultation of the heart reveals- No Murmurs.  Abdomen Inspection:-Inspeection Normal. Palpation/Percussion:Note:No mass. Palpation and Percussion of the abdomen reveal- Non Tender, Non Distended + BS, no rebound or guarding.   Neurologic Cranial Nerve exam:- CN III-XII intact(No nystagmus), symmetric smile. Strength:- 5/5 equal and symmetric strength both upper and lower extremities.   Heent- faint sinus pressure.    Assessment & Plan:   Patient Instructions  Covid infection. Moderate signs/symptoms. Formerly vaccinated and had covid in past. On review day 6 since onset of symptoms so decide not to prescribe antiviral  For secondary sinus infection rx azithromycin antibiotic. For cough rx benzonatate and for nasal congestion rx flonase.  Expect that you will progressively improve.  Follow up 7-10 days or sooner if needed

## 2022-08-07 NOTE — Patient Instructions (Addendum)
Covid infection. Moderate signs/symptoms. Formerly vaccinated and had covid in past. On review day 6 since onset of symptoms so decide not to prescribe antiviral  For secondary sinus infection rx azithromycin antibiotic. For cough rx benzonatate and for nasal congestion rx flonase.  Expect that you will progressively improve.  Follow up 7-10 days or sooner if needed  Placing future cmp, lipid and A1c for chronic problems. Can get scheduled 1-2 weeks.

## 2022-08-09 ENCOUNTER — Telehealth: Payer: Self-pay | Admitting: Medical

## 2022-08-09 MED ORDER — OMEPRAZOLE 20 MG PO CPDR: 20.0000 mg | DELAYED_RELEASE_CAPSULE | Freq: Every day | ORAL | 2 refills | Status: DC

## 2022-08-09 MED ORDER — METOPROLOL SUCCINATE ER 50 MG PO TB24: 50.0000 mg | ORAL_TABLET | Freq: Every day | ORAL | 2 refills | Status: DC

## 2022-08-09 MED ORDER — FLUTICASONE PROPIONATE 50 MCG/ACT NA SUSP: 2.0000 | Freq: Every day | NASAL | 1 refills | Status: DC

## 2022-08-09 MED ORDER — ALENDRONATE SODIUM 10 MG PO TABS: 10.0000 mg | ORAL_TABLET | Freq: Every day | ORAL | 3 refills | Status: DC

## 2022-08-09 MED ORDER — AMLODIPINE BESYLATE 10 MG PO TABS: 10.0000 mg | ORAL_TABLET | Freq: Every day | ORAL | 2 refills | Status: DC

## 2022-08-09 MED ORDER — HYDROCHLOROTHIAZIDE 12.5 MG PO CAPS: 12.5000 mg | ORAL_CAPSULE | Freq: Every day | ORAL | 2 refills | Status: DC

## 2022-08-09 MED ORDER — ATORVASTATIN CALCIUM 40 MG PO TABS: 40.0000 mg | ORAL_TABLET | Freq: Every day | ORAL | 2 refills | Status: DC

## 2022-08-09 MED ORDER — LOSARTAN POTASSIUM 25 MG PO TABS: 25.0000 mg | ORAL_TABLET | Freq: Every day | ORAL | 2 refills | Status: DC

## 2022-08-09 NOTE — Telephone Encounter (Signed)
Prescription Request  08/09/2022  Is this a "Controlled Substance" medicine? No  LOV: 08/07/2022  What is the name of the medication or equipment?   "All Regular Medications" Pt was not able to provide medication names  Have you contacted your pharmacy to request a refill? No   Which pharmacy would you like this sent to?   [facility], [address] [place] [address] Phone: [phone] Fax: [phone]    Patient notified that their request is being sent to the clinical staff for review and that they should receive a response within 2 business days.   Please advise at Mobile [phone] (mobile)

## 2022-08-09 NOTE — Telephone Encounter (Signed)
All medications prescribed by edward sent to [facility]

## 2022-08-21 ENCOUNTER — Ambulatory Visit: Payer: Medicare Other | Admitting: Medical

## 2022-08-21 ENCOUNTER — Other Ambulatory Visit (HOSPITAL_BASED_OUTPATIENT_CLINIC_OR_DEPARTMENT_OTHER): Payer: Self-pay

## 2022-08-21 ENCOUNTER — Ambulatory Visit (HOSPITAL_BASED_OUTPATIENT_CLINIC_OR_DEPARTMENT_OTHER)
Admission: RE | Admit: 2022-08-21 | Discharge: 2022-08-21 | Disposition: A | Discharge status: Home / Self Care | Payer: Medicare Other | Source: Ambulatory Visit | Attending: Medical | Admitting: Medical

## 2022-08-21 VITALS — BP 136/70 | HR 56 | Temp 98.2°F | Resp 18 | Ht 62.5 in | Wt 174.0 lb

## 2022-08-21 DIAGNOSIS — R059 Cough, unspecified: Secondary | ICD-10-CM

## 2022-08-21 DIAGNOSIS — E1165 Type 2 diabetes mellitus with hyperglycemia: Secondary | ICD-10-CM

## 2022-08-21 DIAGNOSIS — R5383 Other fatigue: Secondary | ICD-10-CM | POA: Diagnosis present

## 2022-08-21 DIAGNOSIS — M791 Myalgia, unspecified site: Secondary | ICD-10-CM | POA: Diagnosis not present

## 2022-08-21 DIAGNOSIS — D649 Anemia, unspecified: Secondary | ICD-10-CM

## 2022-08-21 NOTE — Patient Instructions (Addendum)
Post covid residual fatigue, cough, and myalgia. Presented past antiviral treatment window time frame.  Rx'd azithromcin(finished), benzontate and flonase on last visit.   Will get cbc, sed rate and cxr. Continue flonase and benzonatate.  For myalgia can use tylenol.  Will also get cmp and check your A1c. Some time since A1c checked. Continue trulicity.  Follow up in 10 days or sooner if needed.

## 2022-08-21 NOTE — Progress Notes (Signed)
Subjective:    Patient ID: Olivia Morales, female    DOB: [birth year], 72 y.o.   MRN: [id number]  HPI  Pt in for follow up post covid onset. She presented on day 6 so antiviral not given. Was concerned for sinus infection so rx'd azithromycin. Pt overall feels better but still feels fatigued. She still has joint pains and some residual body aches. Pt states some residual persistent cough.   AVS summary. "Covid infection. Moderate signs/symptoms. Formerly vaccinated and had covid in past. On review day 6 since onset of symptoms so decide not to prescribe antiviral   For secondary sinus infection rx azithromycin antibiotic. For cough rx benzonatate and for nasal congestion rx flonase."  On review increased cr, decrased gfr and diabetic. Last A1c 08/02/2021. Was 6.3.  Review of Systems  Constitutional:  Positive for fatigue. Negative for chills and fever.  HENT:  Positive for congestion. Negative for drooling.   Respiratory:  Positive for cough. Negative for chest tightness and wheezing.   Cardiovascular:  Negative for chest pain and palpitations.  Gastrointestinal:  Negative for abdominal pain.  Musculoskeletal:  Positive for myalgias.  Neurological:  Positive for dizziness and headaches.  Hematological:  Positive for adenopathy.  Psychiatric/Behavioral:  Negative for behavioral problems and confusion.     Past Medical History:  Diagnosis Date   Diabetes mellitus    Hypercholesteremia    Hypertension    Sleep apnea      Social History   Socioeconomic History   Marital status: Widowed    Spouse name: Not on file   Number of children: Not on file   Years of education: Not on file   Highest education level: Not on file  Occupational History   Not on file  Tobacco Use   Smoking status: Never   Smokeless tobacco: Never  Vaping Use   Vaping Use: Never used  Substance and Sexual Activity   Alcohol use: Yes    Comment: weekly   Drug use: No   Sexual activity: Not on  file  Other Topics Concern   Not on file  Social History Narrative   Not on file   Social Determinants of Health   Financial Resource Strain: Low Risk  (06/05/2021)   Overall Financial Resource Strain (CARDIA)    Difficulty of Paying Living Expenses: Not hard at all  Food Insecurity: No Food Insecurity (06/05/2021)   Hunger Vital Sign    Worried About Running Out of Food in the Last Year: Never true    [place] in the Last Year: Never true  Transportation Needs: No Transportation Needs (06/05/2021)   PRAPARE - Hydrologist (Medical): No    Lack of Transportation (Non-Medical): No  Physical Activity: Inactive (06/05/2021)   Exercise Vital Sign    Days of Exercise per Week: 0 days    Minutes of Exercise per Session: 0 min  Stress: No Stress Concern Present (06/05/2021)   [place]    Feeling of Stress : Not at all  Social Connections: Moderately Integrated (06/05/2021)   Social Connection and Isolation Panel [NHANES]    Frequency of Communication with Friends and Family: More than three times a week    Frequency of Social Gatherings with Friends and Family: More than three times a week    Attends Religious Services: More than 4 times per year    Active Member of [employer] or Organizations: Yes  Attends Archivist Meetings: More than 4 times per year    Marital Status: Widowed  Intimate Partner Violence: Not At Risk (06/05/2021)   Humiliation, Afraid, Rape, and Kick questionnaire    Fear of Current or Ex-Partner: No    Emotionally Abused: No    Physically Abused: No    Sexually Abused: No    Past Surgical History:  Procedure Laterality Date   TUBAL LIGATION      Family History  Problem Relation Age of Onset   Sickle cell anemia Father     Allergies  Allergen Reactions   Ace Inhibitors     Cough    Current Outpatient Medications on File Prior to Visit   Medication Sig Dispense Refill   alendronate (FOSAMAX) 10 MG tablet Take 1 tablet (10 mg total) by mouth daily before breakfast. Take with a full glass of water on an empty stomach. 90 tablet 3   amLODipine (NORVASC) 10 MG tablet Take 1 tablet (10 mg total) by mouth daily. 100 tablet 2   atorvastatin (LIPITOR) 40 MG tablet Take 1 tablet (40 mg total) by mouth daily. 100 tablet 2   benzonatate (TESSALON) 100 MG capsule Take 1 capsule (100 mg total) by mouth 3 (three) times daily as needed for cough. 30 capsule 0   COVID-19 mRNA bivalent vaccine, Moderna, (MODERNA COVID-19 BIVAL BOOSTER) 50 MCG/0.5ML injection Inject into the muscle. 0.5 mL 0   Diclofenac Sodium (PENNSAID) 2 % SOLN Place 1 application onto the skin 2 (two) times daily. 112 g 2   dicyclomine (BENTYL) 20 MG tablet Take 1 tablet (20 mg total) by mouth 2 (two) times daily as needed for spasms. 10 tablet 0   diphenhydramine-acetaminophen (TYLENOL PM) 25-500 MG TABS Take 2 tablets by mouth every 4 (four) hours as needed (pain).     Dulaglutide (TRULICITY) A999333 0000000 SOPN Inject 0.75 mg into the skin once a week. 6 mL 3   fluticasone (FLONASE) 50 MCG/ACT nasal spray Place 2 sprays into both nostrils daily. 16 g 1   hydrochlorothiazide (MICROZIDE) 12.5 MG capsule Take 1 capsule (12.5 mg total) by mouth daily. 100 capsule 2   influenza vaccine adjuvanted (FLUAD) 0.5 ML injection Inject into the muscle. 0.5 mL 0   Lancets (ONETOUCH DELICA PLUS 123XX123) MISC USE TO TEST 1-2 TIMES DAILY 100 each 0   losartan (COZAAR) 25 MG tablet Take 1 tablet (25 mg total) by mouth daily. 100 tablet 2   magnesium oxide (MAG-OX) 400 (240 Mg) MG tablet Take 1 tablet by mouth once a day 120 tablet 5   magnesium oxide (MAG-OX) 400 MG tablet Take 1 tablet (400 mg total) by mouth daily. 30 tablet 5   meloxicam (MOBIC) 15 MG tablet Take 1 tablet (15 mg total) by mouth daily. 90 tablet 3   metoprolol succinate (TOPROL-XL) 50 MG 24 hr tablet Take 1 tablet (50 mg  total) by mouth daily. Take with or immediately following a meal. 100 tablet 2   omeprazole (PRILOSEC) 20 MG capsule Take 1 capsule (20 mg total) by mouth daily. 100 capsule 2   ondansetron (ZOFRAN ODT) 4 MG disintegrating tablet Take 1 tablet (4 mg total) by mouth every 8 (eight) hours as needed for nausea or vomiting. 15 tablet 0   ONETOUCH VERIO test strip Check blood sugar 1-2 times daily 100 each 12   No current facility-administered medications on file prior to visit.    BP 136/70   Pulse (!) 56   Temp 98.2  F (36.8 C)   Resp 18   Ht 5' 2.5" (1.588 m)   Wt 174 lb (78.9 kg)   SpO2 100%   BMI 31.32 kg/m        Objective:   Physical Exam  General Mental Status- Alert. General Appearance- Not in acute distress.   Skin General: Color- Normal Color. Moisture- Normal Moisture.  Neck Carotid Arteries- Normal color. Moisture- Normal Moisture. No carotid bruits. No JVD.  Chest and Lung Exam Auscultation: Breath Sounds:-Normal.  Cardiovascular Auscultation:Rythm- Regular. Murmurs & Other Heart Sounds:Auscultation of the heart reveals- No Murmurs.  Abdomen Inspection:-Inspeection Normal. Palpation/Percussion:Note:No mass. Palpation and Percussion of the abdomen reveal- Non Tender, Non Distended + BS, no rebound or guarding.   Neurologic Cranial Nerve exam:- CN III-XII intact(No nystagmus), symmetric smile. Strength:- 5/5 equal and symmetric strength both upper and lower extremities.   Heent- no sinus pressure.    Assessment & Plan:   Patient Instructions  Post covid residual fatigue, cough, and myalgia. Presented past antiviral treatment window time frame.  Rx'd azithromcin(finished), benzontate and flonase on last visit.   Will get cbc, sed rate and cxr. Continue flonase and benzonatate.  For myalgia can use tylenol.  Will also get cmp and check your A1c. Some time since A1c checked. Continue trulicity.  Follow up in 10 days or sooner if needed.   Mackie Pai, PA-C

## 2022-08-22 LAB — CBC WITH DIFFERENTIAL/PLATELET
Basophils Absolute: 0 10*3/uL (ref 0.0–0.1)
Basophils Relative: 1.2 % (ref 0.0–3.0)
Eosinophils Absolute: 0.2 10*3/uL (ref 0.0–0.7)
Eosinophils Relative: 4.3 % (ref 0.0–5.0)
HCT: 31.6 % — ABNORMAL LOW (ref 36.0–46.0)
Hemoglobin: 10.7 g/dL — ABNORMAL LOW (ref 12.0–15.0)
Lymphocytes Relative: 34.9 % (ref 12.0–46.0)
Lymphs Abs: 1.2 10*3/uL (ref 0.7–4.0)
MCHC: 33.9 g/dL (ref 30.0–36.0)
MCV: 83.5 fl (ref 78.0–100.0)
Monocytes Absolute: 0.4 10*3/uL (ref 0.1–1.0)
Monocytes Relative: 12.5 % — ABNORMAL HIGH (ref 3.0–12.0)
Neutro Abs: 1.7 10*3/uL (ref 1.4–7.7)
Neutrophils Relative %: 47.1 % (ref 43.0–77.0)
Platelets: 308 10*3/uL (ref 150.0–400.0)
RBC: 3.78 Mil/uL — ABNORMAL LOW (ref 3.87–5.11)
RDW: 14.1 % (ref 11.5–15.5)
WBC: 3.6 10*3/uL — ABNORMAL LOW (ref 4.0–10.5)

## 2022-08-22 LAB — COMPREHENSIVE METABOLIC PANEL
ALT: 12 U/L (ref 0–35)
AST: 18 U/L (ref 0–37)
Albumin: 4.6 g/dL (ref 3.5–5.2)
Alkaline Phosphatase: 105 U/L (ref 39–117)
BUN: 17 mg/dL (ref 6–23)
CO2: 26 mEq/L (ref 19–32)
Calcium: 10 mg/dL (ref 8.4–10.5)
Chloride: 105 mEq/L (ref 96–112)
Creatinine, Ser: 1.43 mg/dL — ABNORMAL HIGH (ref 0.40–1.20)
GFR: 37.29 mL/min — ABNORMAL LOW (ref >60.00)
Glucose, Bld: 76 mg/dL (ref 70–99)
Potassium: 4 mEq/L (ref 3.5–5.1)
Sodium: 143 mEq/L (ref 135–145)
Total Bilirubin: 0.7 mg/dL (ref 0.2–1.2)
Total Protein: 7.4 g/dL (ref 6.0–8.3)

## 2022-08-22 LAB — SEDIMENTATION RATE: Sed Rate: 24 mm/hr (ref 0–30)

## 2022-08-22 LAB — HEMOGLOBIN A1C: Hgb A1c MFr Bld: 6.1 % (ref 4.6–6.5)

## 2022-08-23 ENCOUNTER — Ambulatory Visit (INDEPENDENT_AMBULATORY_CARE_PROVIDER_SITE_OTHER): Payer: Medicare Other | Admitting: *Deleted

## 2022-08-23 DIAGNOSIS — Z0000 Encounter for general adult medical examination without abnormal findings: Secondary | ICD-10-CM | POA: Diagnosis not present

## 2022-08-23 NOTE — Addendum Note (Signed)
Addended by: Anabel Halon on: 08/23/2022 07:45 PM   Modules accepted: Orders

## 2022-08-23 NOTE — Progress Notes (Signed)
Subjective:   Olivia Morales is a 72 y.o. female who presents for Medicare Annual (Subsequent) preventive examination.  I connected with  Olivia Morales on 08/23/22 by a audio enabled telemedicine application and verified that I am speaking with the correct person using two identifiers.  Patient Location: Home  Provider Location: Office/Clinic  I discussed the limitations of evaluation and management by telemedicine. The patient expressed understanding and agreed to proceed.   Review of Systems    Defer to PCP Cardiac Risk Factors include: advanced age (>35mn, >>19women);obesity (BMI >30kg/m2);diabetes mellitus;dyslipidemia;hypertension     Objective:    There were no vitals filed for this visit. There is no height or weight on file to calculate BMI.     08/23/2022   11:12 AM 06/05/2021    9:49 AM 10/18/2020    6:43 PM 12/31/2019   12:54 PM 09/25/2019   12:11 PM 05/06/2018    8:45 AM 02/24/2018    8:03 AM  Advanced Directives  Does Patient Have a Medical Advance Directive? No No No No No No No  Does patient want to make changes to medical advance directive?       No - Patient declined  Would patient like information on creating a medical advance directive? No - Patient declined Yes (MAU/Ambulatory/Procedural Areas - Information given) No - Patient declined   Yes (MAU/Ambulatory/Procedural Areas - Information given)     Current Medications (verified) Outpatient Encounter Medications as of 08/23/2022  Medication Sig   alendronate (FOSAMAX) 10 MG tablet Take 1 tablet (10 mg total) by mouth daily before breakfast. Take with a full glass of water on an empty stomach.   amLODipine (NORVASC) 10 MG tablet Take 1 tablet (10 mg total) by mouth daily.   atorvastatin (LIPITOR) 40 MG tablet Take 1 tablet (40 mg total) by mouth daily.   benzonatate (TESSALON) 100 MG capsule Take 1 capsule (100 mg total) by mouth 3 (three) times daily as needed for cough.   COVID-19 mRNA  bivalent vaccine, Moderna, (MODERNA COVID-19 BIVAL BOOSTER) 50 MCG/0.5ML injection Inject into the muscle.   Diclofenac Sodium (PENNSAID) 2 % SOLN Place 1 application onto the skin 2 (two) times daily.   dicyclomine (BENTYL) 20 MG tablet Take 1 tablet (20 mg total) by mouth 2 (two) times daily as needed for spasms.   diphenhydramine-acetaminophen (TYLENOL PM) 25-500 MG TABS Take 2 tablets by mouth every 4 (four) hours as needed (pain).   Dulaglutide (TRULICITY) [id number] Inject 0.75 mg into the skin once a week.   fluticasone (FLONASE) 50 MCG/ACT nasal spray Place 2 sprays into both nostrils daily.   hydrochlorothiazide (MICROZIDE) 12.5 MG capsule Take 1 capsule (12.5 mg total) by mouth daily.   influenza vaccine adjuvanted (FLUAD) 0.5 ML injection Inject into the muscle.   Lancets (ONETOUCH DELICA PLUS L123XX123 MISC USE TO TEST 1-2 TIMES DAILY   losartan (COZAAR) 25 MG tablet Take 1 tablet (25 mg total) by mouth daily.   magnesium oxide (MAG-OX) 400 (240 Mg) MG tablet Take 1 tablet by mouth once a day   magnesium oxide (MAG-OX) 400 MG tablet Take 1 tablet (400 mg total) by mouth daily.   meloxicam (MOBIC) 15 MG tablet Take 1 tablet (15 mg total) by mouth daily.   metoprolol succinate (TOPROL-XL) 50 MG 24 hr tablet Take 1 tablet (50 mg total) by mouth daily. Take with or immediately following a meal.   omeprazole (PRILOSEC) 20 MG capsule Take 1 capsule (20 mg total) by  mouth daily.   ondansetron (ZOFRAN ODT) 4 MG disintegrating tablet Take 1 tablet (4 mg total) by mouth every 8 (eight) hours as needed for nausea or vomiting.   ONETOUCH VERIO test strip Check blood sugar 1-2 times daily   No facility-administered encounter medications on file as of 08/23/2022.    Allergies (verified) Ace inhibitors   History: Past Medical History:  Diagnosis Date   Diabetes mellitus    Hypercholesteremia    Hypertension    Sleep apnea    Past Surgical History:  Procedure Laterality Date    TUBAL LIGATION     Family History  Problem Relation Age of Onset   Sickle cell anemia Father    Social History   Socioeconomic History   Marital status: Widowed    Spouse name: Not on file   Number of children: Not on file   Years of education: Not on file   Highest education level: Not on file  Occupational History   Not on file  Tobacco Use   Smoking status: Never   Smokeless tobacco: Never  Vaping Use   Vaping Use: Never used  Substance and Sexual Activity   Alcohol use: Yes    Comment: weekly   Drug use: No   Sexual activity: Not on file  Other Topics Concern   Not on file  Social History Narrative   Not on file   Social Determinants of Health   Financial Resource Strain: Low Risk  (06/05/2021)   Overall Financial Resource Strain (CARDIA)    Difficulty of Paying Living Expenses: Not hard at all  Food Insecurity: No Food Insecurity (08/23/2022)   Hunger Vital Sign    Worried About Running Out of Food in the Last Year: Never true    [place] in the Last Year: Never true  Transportation Needs: No Transportation Needs (08/23/2022)   PRAPARE - Hydrologist (Medical): No    Lack of Transportation (Non-Medical): No  Physical Activity: Inactive (06/05/2021)   Exercise Vital Sign    Days of Exercise per Week: 0 days    Minutes of Exercise per Session: 0 min  Stress: No Stress Concern Present (06/05/2021)   [place]    Feeling of Stress : Not at all  Social Connections: Moderately Integrated (06/05/2021)   Social Connection and Isolation Panel [NHANES]    Frequency of Communication with Friends and Family: More than three times a week    Frequency of Social Gatherings with Friends and Family: More than three times a week    Attends Religious Services: More than 4 times per year    Active Member of [employer] or Organizations: Yes    Attends Archivist Meetings: More  than 4 times per year    Marital Status: Widowed    Tobacco Counseling Counseling given: Not Answered   Clinical Intake:  Pre-visit preparation completed: Yes  Pain : No/denies pain  Nutritional Risks: None Diabetes: Yes CBG done?: No Did pt. bring in CBG monitor from home?: No  How often do you need to have someone help you when you read instructions, pamphlets, or other written materials from your doctor or pharmacy?: 1 - Never   Activities of Daily Living    08/23/2022   11:15 AM  In your present state of health, do you have any difficulty performing the following activities:  Hearing? 1  Comment some slight hearing loss  Vision? 0  Difficulty concentrating or making decisions? 1  Comment some memory loss  Walking or climbing stairs? 1  Dressing or bathing? 0  Doing errands, shopping? 0  Preparing Food and eating ? N  Using the Toilet? N  In the past six months, have you accidently leaked urine? Y  Do you have problems with loss of bowel control? N  Managing your Medications? N  Managing your Finances? N  Housekeeping or managing your Housekeeping? N    Patient Care Team: Saguier, Iris Pert as PCP - General (Internal Medicine)  Indicate any recent Medical Services you may have received from other than [facility] providers in the past year (date may be approximate).     Assessment:   This is a routine wellness examination for Karsten.  Hearing/Vision screen No results found.  Dietary issues and exercise activities discussed: Current Exercise Habits: The patient does not participate in regular exercise at present, Exercise limited by: None identified   Goals Addressed   None    Depression Screen    08/23/2022   11:11 AM 08/07/2022    9:28 AM 06/05/2021    9:54 AM 05/06/2018    8:50 AM 04/29/2018    8:08 AM  PHQ 2/9 Scores  PHQ - 2 Score 0 0 0 0 0    Fall Risk    08/23/2022   11:11 AM 08/07/2022    9:28 AM 06/05/2021    9:51 AM 10/27/2020    9:51 AM  05/06/2018    8:45 AM  Fall Risk   Falls in the past year? 0 0 0 0 1  Number falls in past yr: 0 0 0 0   Injury with Fall? 0 0 0 0 1  Risk for fall due to : No Fall Risks No Fall Risks     Follow up Falls evaluation completed  Falls prevention discussed      FALL RISK PREVENTION PERTAINING TO THE HOME:  Any stairs in or around the home? Yes  If so, are there any without handrails? No  Home free of loose throw rugs in walkways, pet beds, electrical cords, etc? Yes  Adequate lighting in your home to reduce risk of falls? Yes   ASSISTIVE DEVICES UTILIZED TO PREVENT FALLS:  Life alert? No  Use of a cane, walker or w/c? No  Grab bars in the bathroom? Yes  Shower chair or bench in shower? Yes  Elevated toilet seat or a handicapped toilet? No   TIMED UP AND GO:  Was the test performed?  No, audio visit .   Cognitive Function:        08/23/2022   11:23 AM  6CIT Screen  What Year? 0 points  What month? 0 points  What time? 0 points  Count back from 20 0 points  Months in reverse 0 points  Repeat phrase 0 points  Total Score 0 points    Immunizations Immunization History  Administered Date(s) Administered   Fluad Quad(high Dose 65+) 09/10/2017, 04/23/2019, 04/27/2020, 07/11/2021   Influenza, High Dose Seasonal PF 06/01/2014, 09/10/2017, 04/01/2018   Influenza, Seasonal, Injecte, Preservative Fre 04/01/2016   Influenza,trivalent, recombinat, inj, PF 05/26/2012, 04/13/2013   Moderna Covid-19 Vaccine Bivalent Booster 27yr & up 07/11/2021   Moderna SARS-COV2 Booster Vaccination 10/27/2020, 12/15/2020   PPD Test 07/24/2019, 08/28/2019, 09/22/2019   Pneumococcal Conjugate-13 09/10/2017, 04/01/2018   Pneumococcal Polysaccharide-23 08/26/2012, 01/19/2020   Tdap 01/07/2006, 06/07/2016   Zoster Recombinat (Shingrix) 04/10/2018    TDAP status: Up to date  Flu  Vaccine status: Declined, Education has been provided regarding the importance of this vaccine but patient still  declined. Advised may receive this vaccine at local pharmacy or Health Dept. Aware to provide a copy of the vaccination record if obtained from local pharmacy or Health Dept. Verbalized acceptance and understanding.  Pneumococcal vaccine status: Up to date  Covid-19 vaccine status: Information provided on how to obtain vaccines.   Qualifies for Shingles Vaccine? Yes   Zostavax completed No   Shingrix Completed?: No.    Education has been provided regarding the importance of this vaccine. Patient has been advised to call insurance company to determine out of pocket expense if they have not yet received this vaccine. Advised may also receive vaccine at local pharmacy or Health Dept. Verbalized acceptance and understanding.  Screening Tests Health Maintenance  Topic Date Due   Diabetic kidney evaluation - Urine ACR  Never done   Hepatitis C Screening  Never done   Zoster Vaccines- Shingrix (2 of 2) 06/05/2018   FOOT EXAM  04/27/2021   COVID-19 Vaccine (4 - 2023-24 season) 03/02/2022   Medicare Annual Wellness (AWV)  06/05/2022   INFLUENZA VACCINE  09/30/2022 (Originally 01/30/2022)   HEMOGLOBIN A1C  02/19/2023   OPHTHALMOLOGY EXAM  03/30/2023   Diabetic kidney evaluation - eGFR measurement  08/22/2023   MAMMOGRAM  09/26/2023   COLONOSCOPY (Pts 45-1yr Insurance coverage will need to be confirmed)  05/16/2026   DTaP/Tdap/Td (3 - Td or Tdap) 06/07/2026   Pneumonia Vaccine 70 Years old  Completed   DEXA SCAN  Completed   HPV VACCINES  Aged Out    Health Maintenance  Health Maintenance Due  Topic Date Due   Diabetic kidney evaluation - Urine ACR  Never done   Hepatitis C Screening  Never done   Zoster Vaccines- Shingrix (2 of 2) 06/05/2018   FOOT EXAM  04/27/2021   COVID-19 Vaccine (4 - 2023-24 season) 03/02/2022   Medicare Annual Wellness (AWV)  06/05/2022    Colorectal cancer screening: Type of screening: Colonoscopy. Completed 05/16/16. Repeat every 10 years  Mammogram  status: Completed 09/25/21. Repeat every year  Bone Density status: Completed 07/11/21. Results reflect: Bone density results: OSTEOPENIA. Repeat every 2 years.  Lung Cancer Screening: (Low Dose CT Chest recommended if Age 70-80years, 30 pack-year currently smoking OR have quit w/in 15years.) does not qualify.   Additional Screening:  Hepatitis C Screening: does qualify; Completed N/a  Vision Screening: Recommended annual ophthalmology exams for early detection of glaucoma and other disorders of the eye. Is the patient up to date with their annual eye exam?  Yes  Who is the provider or what is the name of the office in which the patient attends annual eye exams? Dr. RQuinn AxeIf pt is not established with a provider, would they like to be referred to a provider to establish care? No .   Dental Screening: Recommended annual dental exams for proper oral hygiene  Community Resource Referral / Chronic Care Management: CRR required this visit?  No   CCM required this visit?  No      Plan:     I have personally reviewed and noted the following in the patient's chart:   Medical and social history Use of alcohol, tobacco or illicit drugs  Current medications and supplements including opioid prescriptions. Patient is not currently taking opioid prescriptions. Functional ability and status Nutritional status Physical activity Advanced directives List of other physicians Hospitalizations, surgeries, and ER visits in previous 134  months Vitals Screenings to include cognitive, depression, and falls Referrals and appointments  In addition, I have reviewed and discussed with patient certain preventive protocols, quality metrics, and best practice recommendations. A written personalized care plan for preventive services as well as general preventive health recommendations were provided to patient.   Due to this being a telephonic visit, the after visit summary with patients  personalized plan was offered to patient via mail or my-chart. Per request, patient was mailed a copy of AVS.  Beatris Ship, [place]   08/23/2022   Nurse Notes: None

## 2022-08-23 NOTE — Patient Instructions (Signed)
Olivia Morales , Thank you for taking time to come for your Medicare Wellness Visit. I appreciate your ongoing commitment to your health goals. Please review the following plan we discussed and let me know if I can assist you in the future.   These are the goals we discussed:  Goals      Patient Stated     Increase exercise        This is a list of the screening recommended for you and due dates:  Health Maintenance  Topic Date Due   Yearly kidney health urinalysis for diabetes  Never done   Hepatitis C Screening: USPSTF Recommendation to screen - Ages 47-79 yo.  Never done   Zoster (Shingles) Vaccine (2 of 2) 06/05/2018   Complete foot exam   04/27/2021   COVID-19 Vaccine (4 - 2023-24 season) 03/02/2022   Flu Shot  09/30/2022*   Hemoglobin A1C  02/19/2023   Eye exam for diabetics  03/30/2023   Yearly kidney function blood test for diabetes  08/22/2023   Medicare Annual Wellness Visit  08/24/2023   Mammogram  09/26/2023   Colon Cancer Screening  05/16/2026   DTaP/Tdap/Td vaccine (3 - Td or Tdap) 06/07/2026   Pneumonia Vaccine  Completed   DEXA scan (bone density measurement)  Completed   HPV Vaccine  Aged Out  *Topic was postponed. The date shown is not the original due date.     Next appointment: Follow up in one year for your annual wellness visit.   Preventive Care 8 Years and Older, Female Preventive care refers to lifestyle choices and visits with your health care provider that can promote health and wellness. What does preventive care include? A yearly physical exam. This is also called an annual well check. Dental exams once or twice a year. Routine eye exams. Ask your health care provider how often you should have your eyes checked. Personal lifestyle choices, including: Daily care of your teeth and gums. Regular physical activity. Eating a healthy diet. Avoiding tobacco and drug use. Limiting alcohol use. Practicing safe sex. Taking low-dose aspirin  every day. Taking vitamin and mineral supplements as recommended by your health care provider. What happens during an annual well check? The services and screenings done by your health care provider during your annual well check will depend on your age, overall health, lifestyle risk factors, and family history of disease. Counseling  Your health care provider may ask you questions about your: Alcohol use. Tobacco use. Drug use. Emotional well-being. Home and relationship well-being. Sexual activity. Eating habits. History of falls. Memory and ability to understand (cognition). Work and work Statistician. Reproductive health. Screening  You may have the following tests or measurements: Height, weight, and BMI. Blood pressure. Lipid and cholesterol levels. These may be checked every 5 years, or more frequently if you are over 27 years old. Skin check. Lung cancer screening. You may have this screening every year starting at age 57 if you have a 30-pack-year history of smoking and currently smoke or have quit within the past 15 years. Fecal occult blood test (FOBT) of the stool. You may have this test every year starting at age 34. Flexible sigmoidoscopy or colonoscopy. You may have a sigmoidoscopy every 5 years or a colonoscopy every 10 years starting at age 15. Hepatitis C blood test. Hepatitis B blood test. Sexually transmitted disease (STD) testing. Diabetes screening. This is done by checking your blood sugar (glucose) after you have not eaten for a while (fasting). You  may have this done every 1-3 years. Bone density scan. This is done to screen for osteoporosis. You may have this done starting at age 79. Mammogram. This may be done every 1-2 years. Talk to your health care provider about how often you should have regular mammograms. Talk with your health care provider about your test results, treatment options, and if necessary, the need for more tests. Vaccines  Your health  care provider may recommend certain vaccines, such as: Influenza vaccine. This is recommended every year. Tetanus, diphtheria, and acellular pertussis (Tdap, Td) vaccine. You may need a Td booster every 10 years. Zoster vaccine. You may need this after age 33. Pneumococcal 13-valent conjugate (PCV13) vaccine. One dose is recommended after age 31. Pneumococcal polysaccharide (PPSV23) vaccine. One dose is recommended after age 55. Talk to your health care provider about which screenings and vaccines you need and how often you need them. This information is not intended to replace advice given to you by your health care provider. Make sure you discuss any questions you have with your health care provider. Document Released: 07/15/2015 Document Revised: 03/07/2016 Document Reviewed: 04/19/2015 Elsevier Interactive Patient Education  2017 [place] Prevention in the Home Falls can cause injuries. They can happen to people of all ages. There are many things you can do to make your home safe and to help prevent falls. What can I do on the outside of my home? Regularly fix the edges of walkways and driveways and fix any cracks. Remove anything that might make you trip as you walk through a door, such as a raised step or threshold. Trim any bushes or trees on the path to your home. Use bright outdoor lighting. Clear any walking paths of anything that might make someone trip, such as rocks or tools. Regularly check to see if handrails are loose or broken. Make sure that both sides of any steps have handrails. Any raised decks and porches should have guardrails on the edges. Have any leaves, snow, or ice cleared regularly. Use sand or salt on walking paths during winter. Clean up any spills in your garage right away. This includes oil or grease spills. What can I do in the bathroom? Use night lights. Install grab bars by the toilet and in the tub and shower. Do not use towel bars as grab  bars. Use non-skid mats or decals in the tub or shower. If you need to sit down in the shower, use a plastic, non-slip stool. Keep the floor dry. Clean up any water that spills on the floor as soon as it happens. Remove soap buildup in the tub or shower regularly. Attach bath mats securely with double-sided non-slip rug tape. Do not have throw rugs and other things on the floor that can make you trip. What can I do in the bedroom? Use night lights. Make sure that you have a light by your bed that is easy to reach. Do not use any sheets or blankets that are too big for your bed. They should not hang down onto the floor. Have a firm chair that has side arms. You can use this for support while you get dressed. Do not have throw rugs and other things on the floor that can make you trip. What can I do in the kitchen? Clean up any spills right away. Avoid walking on wet floors. Keep items that you use a lot in easy-to-reach places. If you need to reach something above you, use a strong  step stool that has a grab bar. Keep electrical cords out of the way. Do not use floor polish or wax that makes floors slippery. If you must use wax, use non-skid floor wax. Do not have throw rugs and other things on the floor that can make you trip. What can I do with my stairs? Do not leave any items on the stairs. Make sure that there are handrails on both sides of the stairs and use them. Fix handrails that are broken or loose. Make sure that handrails are as long as the stairways. Check any carpeting to make sure that it is firmly attached to the stairs. Fix any carpet that is loose or worn. Avoid having throw rugs at the top or bottom of the stairs. If you do have throw rugs, attach them to the floor with carpet tape. Make sure that you have a light switch at the top of the stairs and the bottom of the stairs. If you do not have them, ask someone to add them for you. What else can I do to help prevent  falls? Wear shoes that: Do not have high heels. Have rubber bottoms. Are comfortable and fit you well. Are closed at the toe. Do not wear sandals. If you use a stepladder: Make sure that it is fully opened. Do not climb a closed stepladder. Make sure that both sides of the stepladder are locked into place. Ask someone to hold it for you, if possible. Clearly mark and make sure that you can see: Any grab bars or handrails. First and last steps. Where the edge of each step is. Use tools that help you move around (mobility aids) if they are needed. These include: Canes. Walkers. Scooters. Crutches. Turn on the lights when you go into a dark area. Replace any light bulbs as soon as they burn out. Set up your furniture so you have a clear path. Avoid moving your furniture around. If any of your floors are uneven, fix them. If there are any pets around you, be aware of where they are. Review your medicines with your doctor. Some medicines can make you feel dizzy. This can increase your chance of falling. Ask your doctor what other things that you can do to help prevent falls. This information is not intended to replace advice given to you by your health care provider. Make sure you discuss any questions you have with your health care provider. Document Released: 04/14/2009 Document Revised: 11/24/2015 Document Reviewed: 07/23/2014 Elsevier Interactive Patient Education  2017 [employer].

## 2022-09-05 ENCOUNTER — Other Ambulatory Visit (HOSPITAL_BASED_OUTPATIENT_CLINIC_OR_DEPARTMENT_OTHER): Payer: Self-pay

## 2022-09-05 ENCOUNTER — Other Ambulatory Visit: Payer: Self-pay

## 2022-10-15 ENCOUNTER — Encounter: Payer: Self-pay | Admitting: *Deleted

## 2022-12-06 ENCOUNTER — Other Ambulatory Visit (HOSPITAL_BASED_OUTPATIENT_CLINIC_OR_DEPARTMENT_OTHER): Payer: Self-pay

## 2022-12-06 ENCOUNTER — Other Ambulatory Visit: Payer: Self-pay | Admitting: Medical

## 2022-12-07 ENCOUNTER — Other Ambulatory Visit (HOSPITAL_BASED_OUTPATIENT_CLINIC_OR_DEPARTMENT_OTHER): Payer: Self-pay

## 2022-12-07 ENCOUNTER — Encounter (HOSPITAL_BASED_OUTPATIENT_CLINIC_OR_DEPARTMENT_OTHER): Payer: Self-pay

## 2022-12-08 ENCOUNTER — Other Ambulatory Visit: Payer: Self-pay | Admitting: Medical

## 2022-12-11 ENCOUNTER — Ambulatory Visit: Payer: Medicare Other | Admitting: Medical

## 2022-12-11 ENCOUNTER — Other Ambulatory Visit (HOSPITAL_BASED_OUTPATIENT_CLINIC_OR_DEPARTMENT_OTHER): Payer: Self-pay

## 2022-12-11 ENCOUNTER — Encounter: Payer: Self-pay | Admitting: Medical

## 2022-12-11 VITALS — BP 165/88 | HR 68 | Resp 18 | Ht 62.5 in | Wt 168.0 lb

## 2022-12-11 DIAGNOSIS — K219 Gastro-esophageal reflux disease without esophagitis: Secondary | ICD-10-CM | POA: Diagnosis not present

## 2022-12-11 DIAGNOSIS — Z7985 Long-term (current) use of injectable non-insulin antidiabetic drugs: Secondary | ICD-10-CM

## 2022-12-11 DIAGNOSIS — E1165 Type 2 diabetes mellitus with hyperglycemia: Secondary | ICD-10-CM | POA: Diagnosis not present

## 2022-12-11 DIAGNOSIS — E1159 Type 2 diabetes mellitus with other circulatory complications: Secondary | ICD-10-CM | POA: Diagnosis not present

## 2022-12-11 DIAGNOSIS — R944 Abnormal results of kidney function studies: Secondary | ICD-10-CM

## 2022-12-11 DIAGNOSIS — I152 Hypertension secondary to endocrine disorders: Secondary | ICD-10-CM

## 2022-12-11 DIAGNOSIS — R1310 Dysphagia, unspecified: Secondary | ICD-10-CM | POA: Diagnosis not present

## 2022-12-11 DIAGNOSIS — R059 Cough, unspecified: Secondary | ICD-10-CM

## 2022-12-11 LAB — COMPREHENSIVE METABOLIC PANEL
ALT: 10 U/L (ref 0–35)
AST: 18 U/L (ref 0–37)
Albumin: 4.5 g/dL (ref 3.5–5.2)
Alkaline Phosphatase: 95 U/L (ref 39–117)
BUN: 28 mg/dL — ABNORMAL HIGH (ref 6–23)
CO2: 29 mEq/L (ref 19–32)
Calcium: 10 mg/dL (ref 8.4–10.5)
Chloride: 103 mEq/L (ref 96–112)
Creatinine, Ser: 1.76 mg/dL — ABNORMAL HIGH (ref 0.40–1.20)
GFR: 29 mL/min — ABNORMAL LOW (ref >60.00)
Glucose, Bld: 82 mg/dL (ref 70–99)
Potassium: 3.8 mEq/L (ref 3.5–5.1)
Sodium: 139 mEq/L (ref 135–145)
Total Bilirubin: 0.7 mg/dL (ref 0.2–1.2)
Total Protein: 7.6 g/dL (ref 6.0–8.3)

## 2022-12-11 LAB — HEMOGLOBIN A1C: Hgb A1c MFr Bld: 5.9 % (ref 4.6–6.5)

## 2022-12-11 MED ORDER — FAMOTIDINE 20 MG PO TABS
20.0000 mg | ORAL_TABLET | Freq: Every day | ORAL | 0 refills | Status: DC
  Filled 2022-12-11: qty 30, 30d supply, fill #0

## 2022-12-11 MED ORDER — BENZONATATE 100 MG PO CAPS
100.0000 mg | ORAL_CAPSULE | Freq: Three times a day (TID) | ORAL | 0 refills | Status: DC | PRN
  Filled 2022-12-11: qty 30, 10d supply, fill #0

## 2022-12-11 NOTE — Progress Notes (Signed)
Subjective:    Patient ID: Olivia Morales, female    DOB: 1950/10/18, 72 y.o.   MRN: [id number]  HPI  Pt in for follow up.  She had been asking for refill of benzonatate.   Pt has had lingering cough since February. Pt states years with mild occasional dry cough but worse since February.  She states cough up clear mucus at night. Pt wonders if bp med causing cough.  Pt not having reflux. She states if takes omeprazole.  Pt mentions about once a week. Will have trouble swallowing various type of foods. This has been case since February.  lHtn- losartan 25 mg daily, toprol xl 25 mg daily,  amlodipine 10 mg daily and hctz 12.5 mg daily. Pt bp is high.     Review of Systems  Constitutional:  Negative for chills, fatigue and fever.  Respiratory:  Positive for cough. Negative for chest tightness, shortness of breath and wheezing.   Cardiovascular:  Negative for chest pain.  Gastrointestinal:  Negative for abdominal distention, abdominal pain, blood in stool and rectal pain.       See hpi.  Musculoskeletal:  Negative for back pain and joint swelling.  Neurological:  Negative for dizziness, speech difficulty, weakness and light-headedness.  Hematological:  Negative for adenopathy. Does not bruise/bleed easily.  Psychiatric/Behavioral:  Negative for behavioral problems and decreased concentration.     Past Medical History:  Diagnosis Date   Diabetes mellitus    Hypercholesteremia    Hypertension    Sleep apnea      Social History   Socioeconomic History   Marital status: Widowed    Spouse name: Not on file   Number of children: Not on file   Years of education: Not on file   Highest education level: Not on file  Occupational History   Not on file  Tobacco Use   Smoking status: Never   Smokeless tobacco: Never  Vaping Use   Vaping Use: Never used  Substance and Sexual Activity   Alcohol use: Yes    Comment: weekly   Drug use: No   Sexual activity: Not on file   Other Topics Concern   Not on file  Social History Narrative   Not on file   Social Determinants of Health   Financial Resource Strain: Low Risk  (06/05/2021)   Overall Financial Resource Strain (CARDIA)    Difficulty of Paying Living Expenses: Not hard at all  Food Insecurity: No Food Insecurity (08/23/2022)   Hunger Vital Sign    Worried About Running Out of Food in the Last Year: Never true    Ran Out of Food in the Last Year: Never true  Transportation Needs: No Transportation Needs (08/23/2022)   PRAPARE - Administrator, Civil Service (Medical): No    Lack of Transportation (Non-Medical): No  Physical Activity: Inactive (06/05/2021)   Exercise Vital Sign    Days of Exercise per Week: 0 days    Minutes of Exercise per Session: 0 min  Stress: No Stress Concern Present (06/05/2021)   [employer] of Occupational Health - Occupational Stress Questionnaire    Feeling of Stress : Not at all  Social Connections: Moderately Integrated (06/05/2021)   Social Connection and Isolation Panel [NHANES]    Frequency of Communication with Friends and Family: More than three times a week    Frequency of Social Gatherings with Friends and Family: More than three times a week    Attends Religious Services: More than  4 times per year    Active Member of Clubs or Organizations: Yes    Attends Banker Meetings: More than 4 times per year    Marital Status: Widowed  Intimate Partner Violence: Not At Risk (08/23/2022)   Humiliation, Afraid, Rape, and Kick questionnaire    Fear of Current or Ex-Partner: No    Emotionally Abused: No    Physically Abused: No    Sexually Abused: No    Past Surgical History:  Procedure Laterality Date   TUBAL LIGATION      Family History  Problem Relation Age of Onset   Sickle cell anemia Father     Allergies  Allergen Reactions   Ace Inhibitors     Cough    Current Outpatient Medications on File Prior to Visit  Medication  Sig Dispense Refill   alendronate (FOSAMAX) 10 MG tablet Take 1 tablet (10 mg total) by mouth daily before breakfast. Take with a full glass of water on an empty stomach. 90 tablet 3   amLODipine (NORVASC) 10 MG tablet Take 1 tablet (10 mg total) by mouth daily. 100 tablet 2   atorvastatin (LIPITOR) 40 MG tablet Take 1 tablet (40 mg total) by mouth daily. 100 tablet 2   COVID-19 mRNA bivalent vaccine, Moderna, (MODERNA COVID-19 BIVAL BOOSTER) 50 MCG/0.5ML injection Inject into the muscle. 0.5 mL 0   Diclofenac Sodium (PENNSAID) 2 % SOLN Place 1 application onto the skin 2 (two) times daily. 112 g 2   dicyclomine (BENTYL) 20 MG tablet Take 1 tablet (20 mg total) by mouth 2 (two) times daily as needed for spasms. 10 tablet 0   diphenhydramine-acetaminophen (TYLENOL PM) 25-500 MG TABS Take 2 tablets by mouth every 4 (four) hours as needed (pain).     fluticasone (FLONASE) 50 MCG/ACT nasal spray Place 2 sprays into both nostrils daily. 16 g 1   hydrochlorothiazide (MICROZIDE) 12.5 MG capsule Take 1 capsule (12.5 mg total) by mouth daily. 100 capsule 2   influenza vaccine adjuvanted (FLUAD) 0.5 ML injection Inject into the muscle. 0.5 mL 0   Lancets (ONETOUCH DELICA PLUS LANCET33G) MISC USE TO TEST 1-2 TIMES DAILY 100 each 0   losartan (COZAAR) 25 MG tablet Take 1 tablet (25 mg total) by mouth daily. 100 tablet 2   magnesium oxide (MAG-OX) 400 (240 Mg) MG tablet Take 1 tablet by mouth once a day 120 tablet 5   magnesium oxide (MAG-OX) 400 MG tablet Take 1 tablet (400 mg total) by mouth daily. 30 tablet 5   meloxicam (MOBIC) 15 MG tablet Take 1 tablet (15 mg total) by mouth daily. 90 tablet 3   metoprolol succinate (TOPROL-XL) 50 MG 24 hr tablet Take 1 tablet (50 mg total) by mouth daily. Take with or immediately following a meal. 100 tablet 2   omeprazole (PRILOSEC) 20 MG capsule Take 1 capsule (20 mg total) by mouth daily. 100 capsule 2   ondansetron (ZOFRAN ODT) 4 MG disintegrating tablet Take 1  tablet (4 mg total) by mouth every 8 (eight) hours as needed for nausea or vomiting. 15 tablet 0   ONETOUCH VERIO test strip Check blood sugar 1-2 times daily 100 each 12   TRULICITY 0.75 MG/0.5ML SOPN INJECT THE CONTENTS OF ONE PEN  SUBCUTANEOUSLY WEEKLY AS  DIRECTED 6 mL 3   No current facility-administered medications on file prior to visit.    BP (!) 165/88   Pulse 68   Resp 18   Ht 5' 2.5" (1.588 m)  Wt 168 lb (76.2 kg)   SpO2 100%   BMI 30.24 kg/m        Objective:   Physical Exam  General Mental Status- Alert. General Appearance- Not in acute distress.   Skin General: Color- Normal Color. Moisture- Normal Moisture.  Neck Carotid Arteries- Normal color. Moisture- Normal Moisture. No carotid bruits. No JVD.  Chest and Lung Exam Auscultation: Breath Sounds:-Normal.  Cardiovascular Auscultation:Rythm- Regular. Murmurs & Other Heart Sounds:Auscultation of the heart reveals- No Murmurs.  Abdomen Inspection:-Inspeection Normal. Palpation/Percussion:Note:No mass. Palpation and Percussion of the abdomen reveal- Non Tender, Non Distended + BS, no rebound or guarding.   Neurologic Cranial Nerve exam:- CN III-XII intact(No nystagmus), symmetric smile. Strength:- 5/5 equal and symmetric strength both upper and lower extremities.       Assessment & Plan:   Patient Instructions  Gastroesophageal reflux disease, unspecified whether esophagitis present and Dysphagia, unspecified type Continue omeprazole and adding on famotadine. Gerd diet advised.  Get old colonsocopy report so you can get one if due.  - Ambulatory referral to Gastroenterology for possible egd.   Cough, unspecified type Maybe GI related. See if adding on famotadine improves. Chest xray feb 2024 did not reveal cause.   Hypertension associated with diabetes (HCC)  Increase your losartan to 50 mg daily. Continue toprol xl 25 mg daily,  amlodipine 10 mg daily and hctz 12.5   Follow up 10 days or  sooner if needed.

## 2022-12-11 NOTE — Addendum Note (Signed)
Addended by: Gwenevere Abbot on: 12/11/2022 10:07 PM   Modules accepted: Orders

## 2022-12-11 NOTE — Patient Instructions (Addendum)
Gastroesophageal reflux disease, unspecified whether esophagitis present and Dysphagia, unspecified type Continue omeprazole and adding on famotadine. Gerd diet advised.  Get old colonsocopy report so you can get one if due.  - Ambulatory referral to Gastroenterology for possible egd.   Cough, unspecified type Maybe GI related. See if adding on famotadine improves. Chest xray feb 2024 did not reveal cause.   Hypertension associated with diabetes (HCC)  Increase your losartan to 50 mg daily. Continue toprol xl 25 mg daily,  amlodipine 10 mg daily and hctz 12.5   Follow up 10 days or sooner if needed.

## 2022-12-17 ENCOUNTER — Other Ambulatory Visit (HOSPITAL_BASED_OUTPATIENT_CLINIC_OR_DEPARTMENT_OTHER): Payer: Self-pay

## 2022-12-17 MED ORDER — MELOXICAM 15 MG PO TABS
15.0000 mg | ORAL_TABLET | Freq: Every day | ORAL | 3 refills | Status: DC
  Filled 2022-12-17: qty 30, 30d supply, fill #0
  Filled 2023-02-05 – 2023-02-18 (×2): qty 30, 30d supply, fill #1
  Filled 2023-04-15: qty 30, 30d supply, fill #2
  Filled 2023-06-06: qty 30, 30d supply, fill #3
  Filled 2023-07-08: qty 30, 30d supply, fill #4

## 2022-12-25 ENCOUNTER — Other Ambulatory Visit (HOSPITAL_BASED_OUTPATIENT_CLINIC_OR_DEPARTMENT_OTHER): Payer: Self-pay

## 2023-01-03 IMAGING — MG MM DIGITAL SCREENING BILAT W/ TOMO AND CAD
6 of 10 series · 6 of 30 positions shown · non-contrast
Comparison: Previous exam(s).

CLINICAL DATA: Screening.

EXAM:
DIGITAL SCREENING BILATERAL MAMMOGRAM WITH TOMOSYNTHESIS AND CAD
TECHNIQUE: Bilateral screening digital craniocaudal and mediolateral oblique
mammograms were obtained. Bilateral screening digital breast
tomosynthesis was performed. The images were evaluated with
computer-aided detection.

[R CC synth-2D]
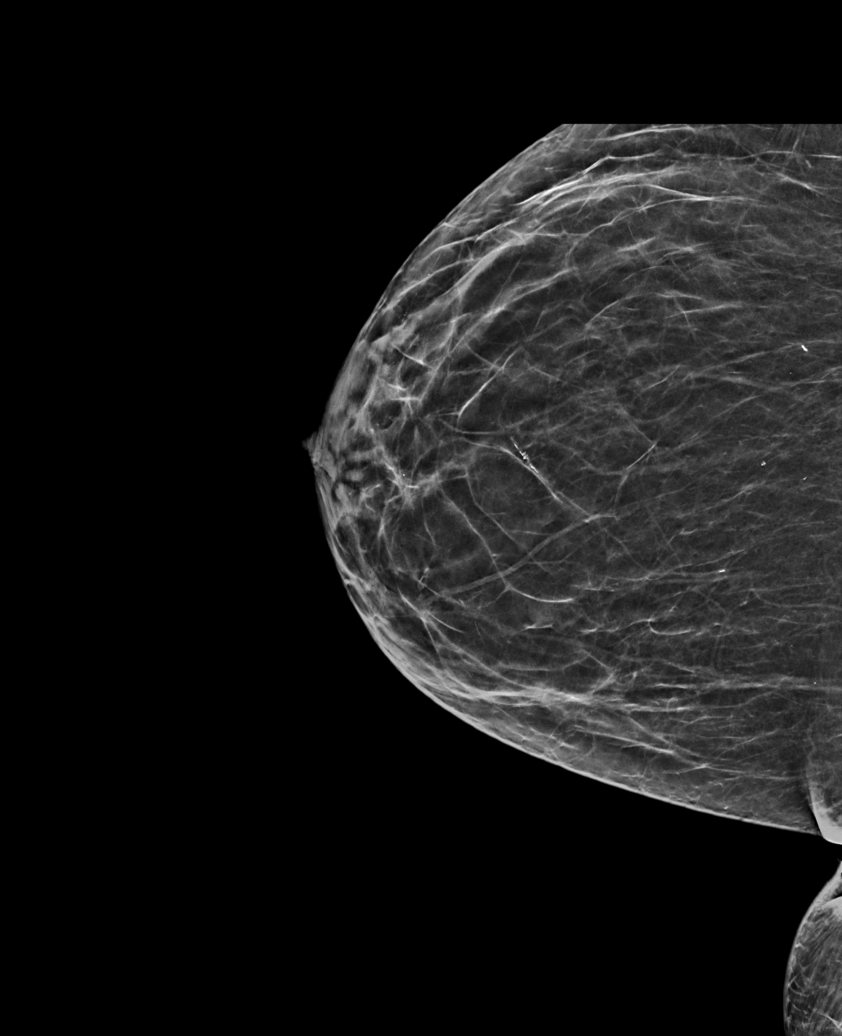

[L CC synth-2D]
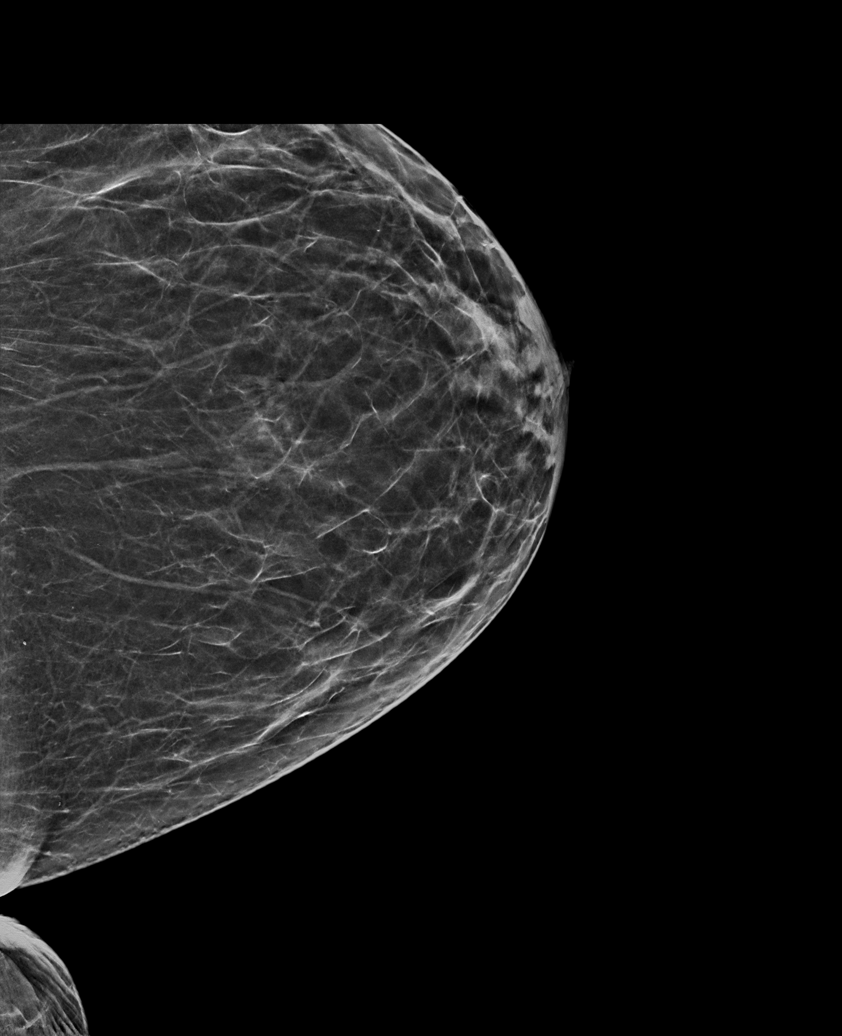

[L MLO synth-2D]
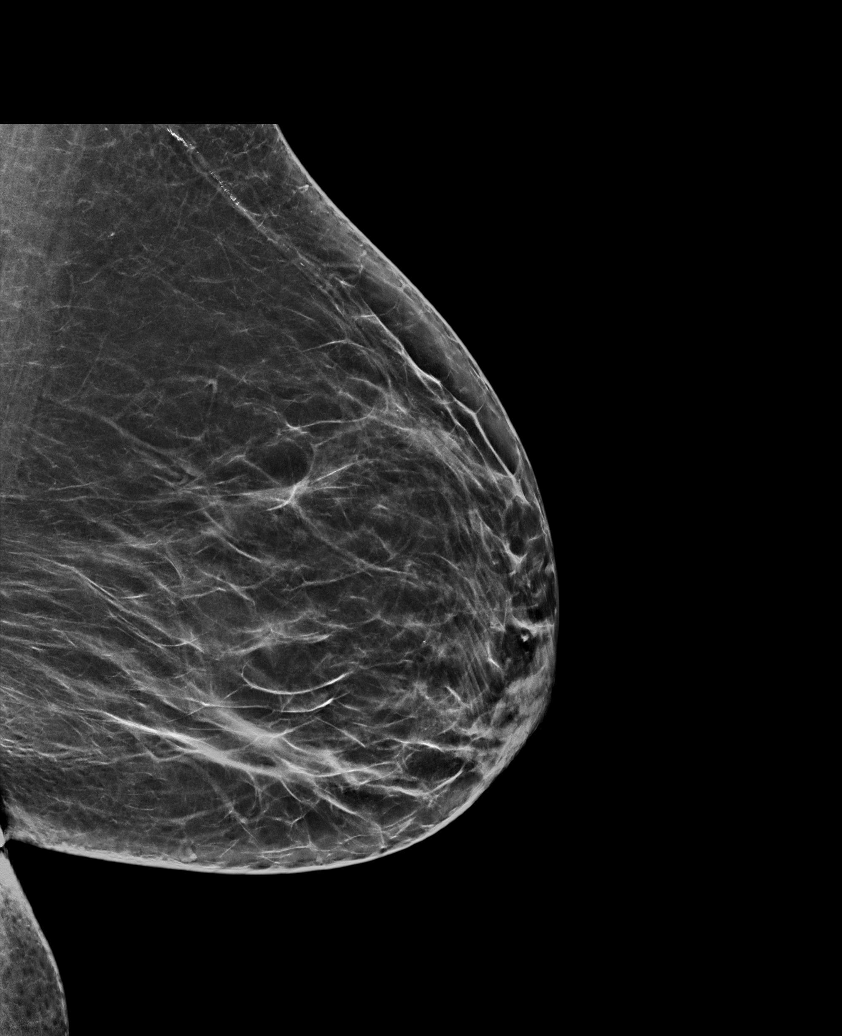

[L XCCL synth-2D]
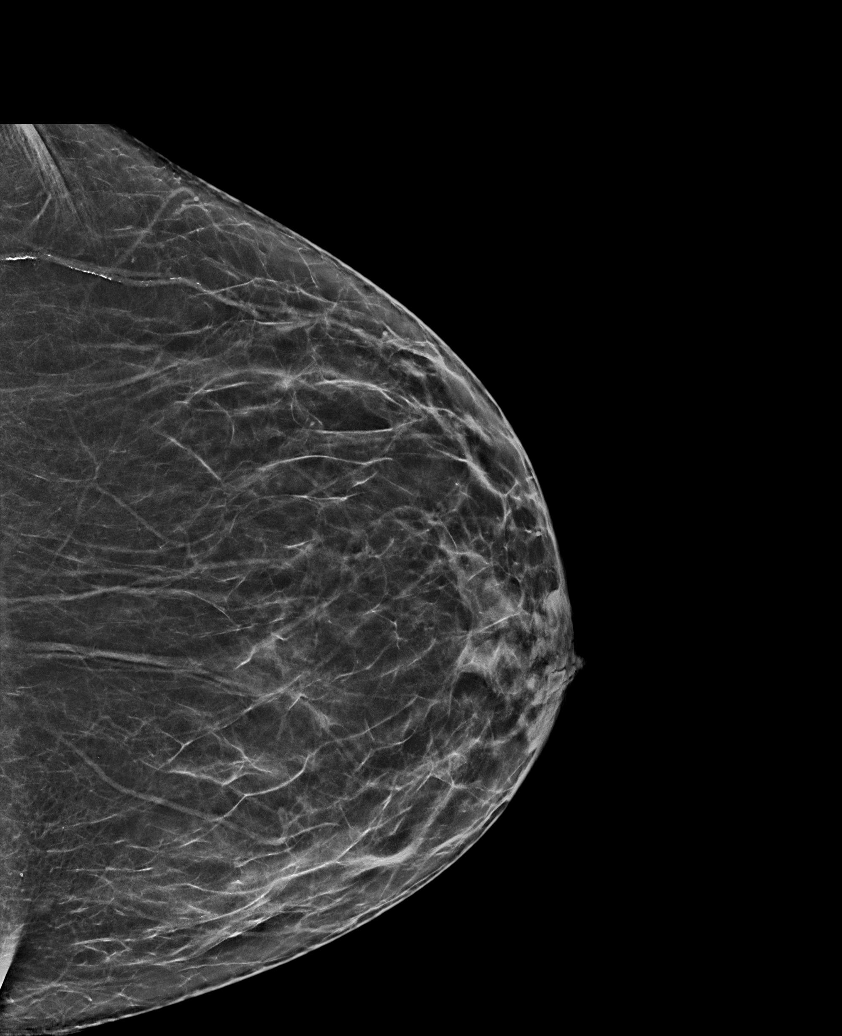

[R MLO synth-2D]
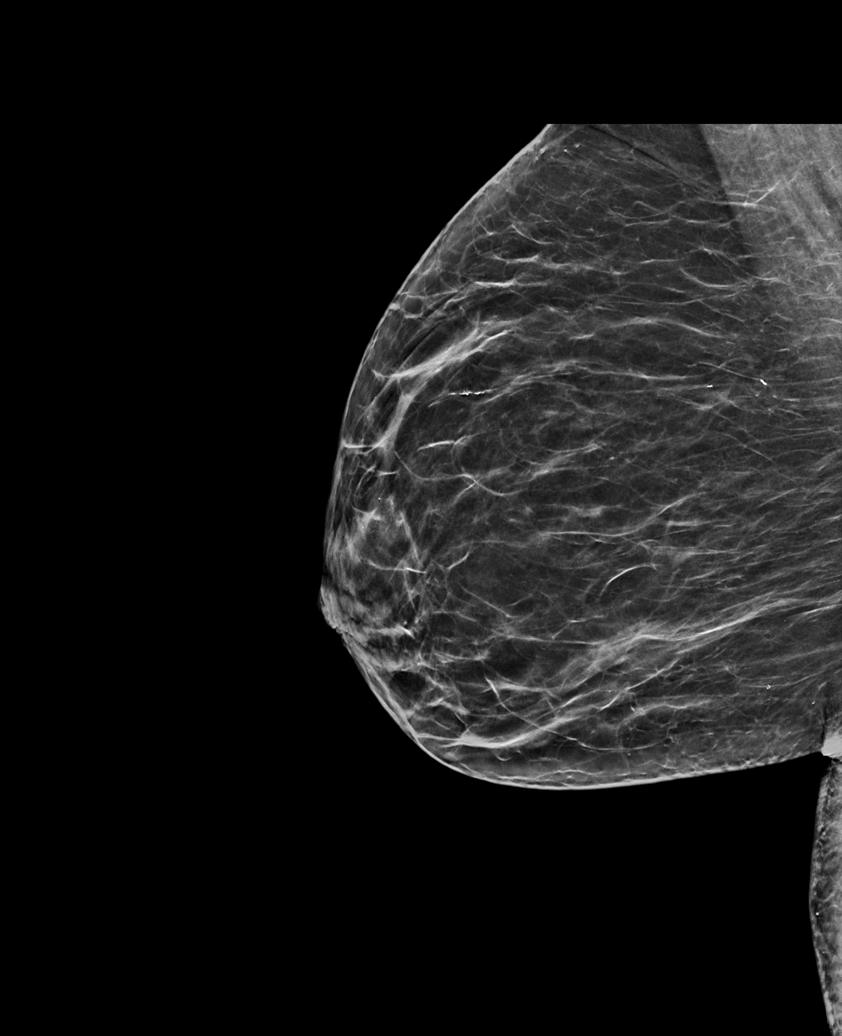

[L CC tomo · tomo slice 29/58.0]
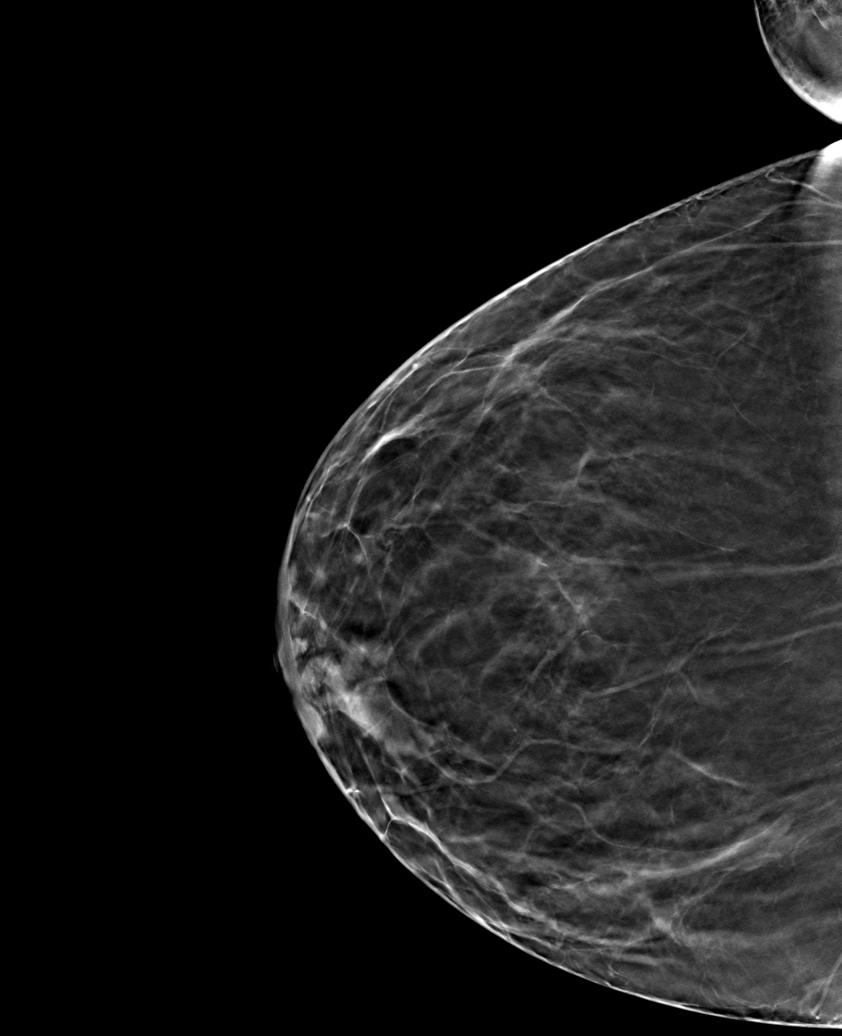

[6 of 30 positions shown; findings below may reference images not displayed]

ACR Breast Density Category c: The breast tissue is heterogeneously
dense, which may obscure small masses.
FINDINGS: There are no findings suspicious for malignancy.
IMPRESSION: No mammographic evidence of malignancy. A result letter of this
screening mammogram will be mailed directly to the patient.

RECOMMENDATION:
Screening mammogram in one year. (Code:Q3-W-BC3)

BI-RADS CATEGORY  1: Negative.

## 2023-01-14 ENCOUNTER — Other Ambulatory Visit (HOSPITAL_BASED_OUTPATIENT_CLINIC_OR_DEPARTMENT_OTHER): Payer: Self-pay

## 2023-01-23 ENCOUNTER — Other Ambulatory Visit: Payer: Self-pay

## 2023-01-23 MED ORDER — LOSARTAN POTASSIUM 50 MG PO TABS: 50.0000 mg | ORAL_TABLET | Freq: Every day | ORAL | 3 refills | Status: DC

## 2023-02-05 ENCOUNTER — Other Ambulatory Visit (HOSPITAL_BASED_OUTPATIENT_CLINIC_OR_DEPARTMENT_OTHER): Payer: Self-pay

## 2023-02-13 ENCOUNTER — Other Ambulatory Visit (HOSPITAL_BASED_OUTPATIENT_CLINIC_OR_DEPARTMENT_OTHER): Payer: Self-pay

## 2023-02-15 IMAGING — DX DG FOOT COMPLETE 3+V*R*
3 series · 3 of 3 positions shown · non-contrast
Comparison: Right foot radiographs 04/16/2005

CLINICAL DATA: Right heel pain when standing or walking. No known
injury.

EXAM:
RIGHT FOOT COMPLETE - 3+ VIEW

[foot ap]
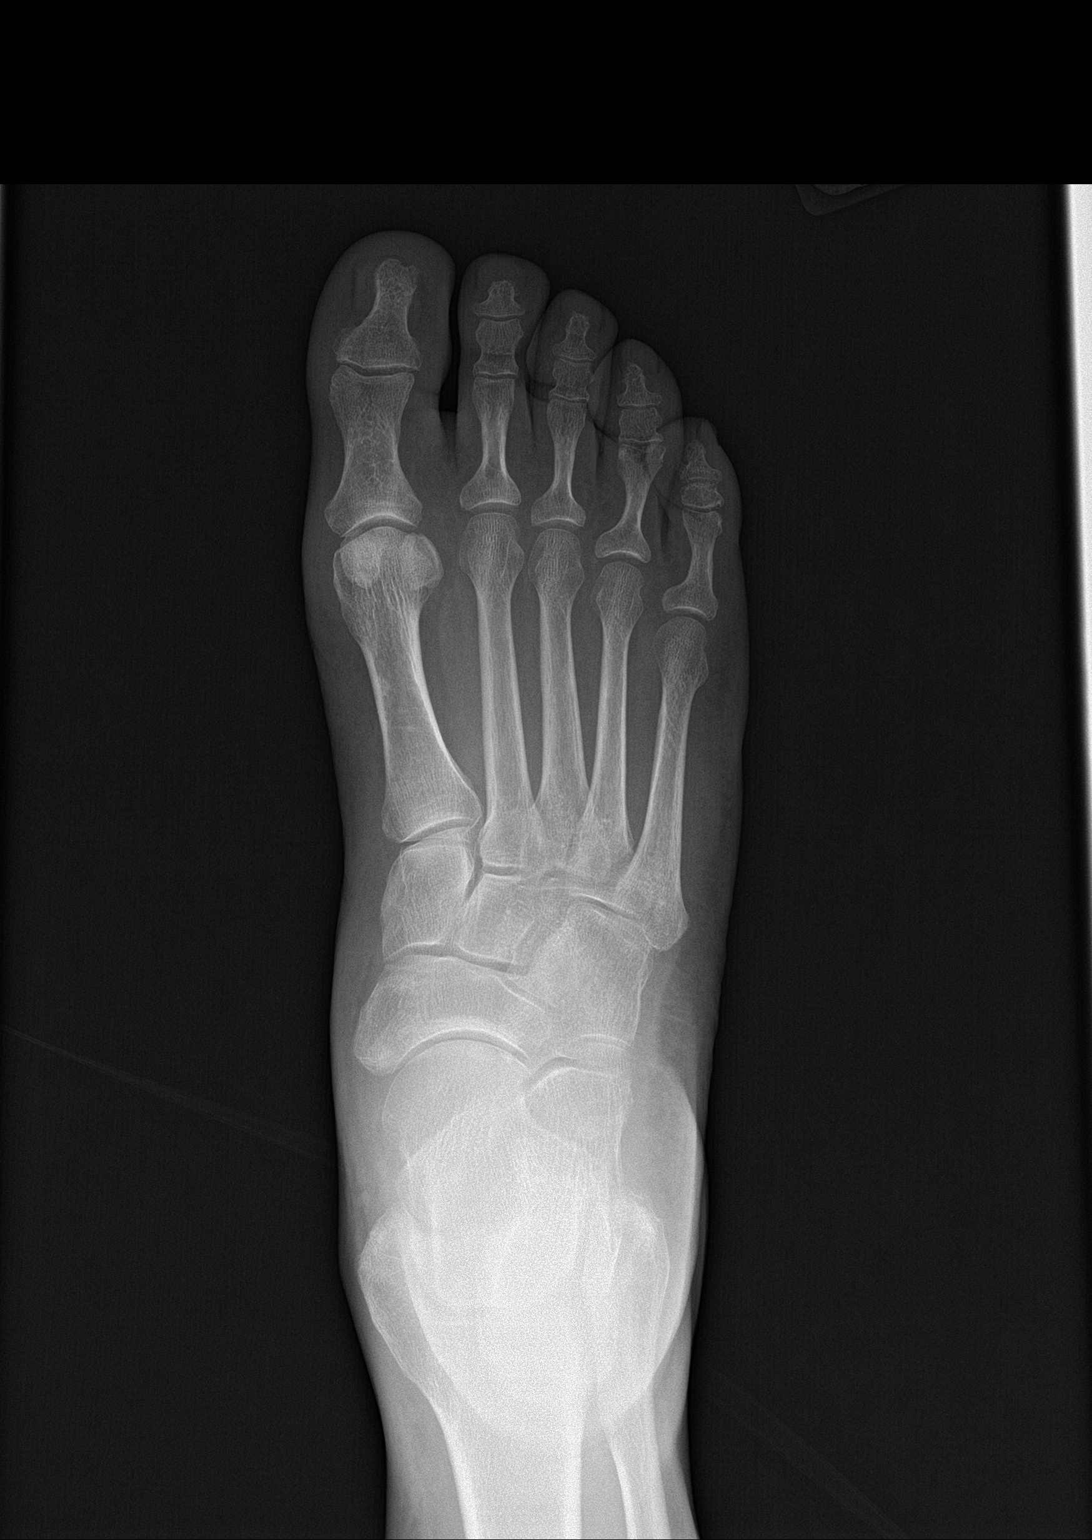

[foot obl]
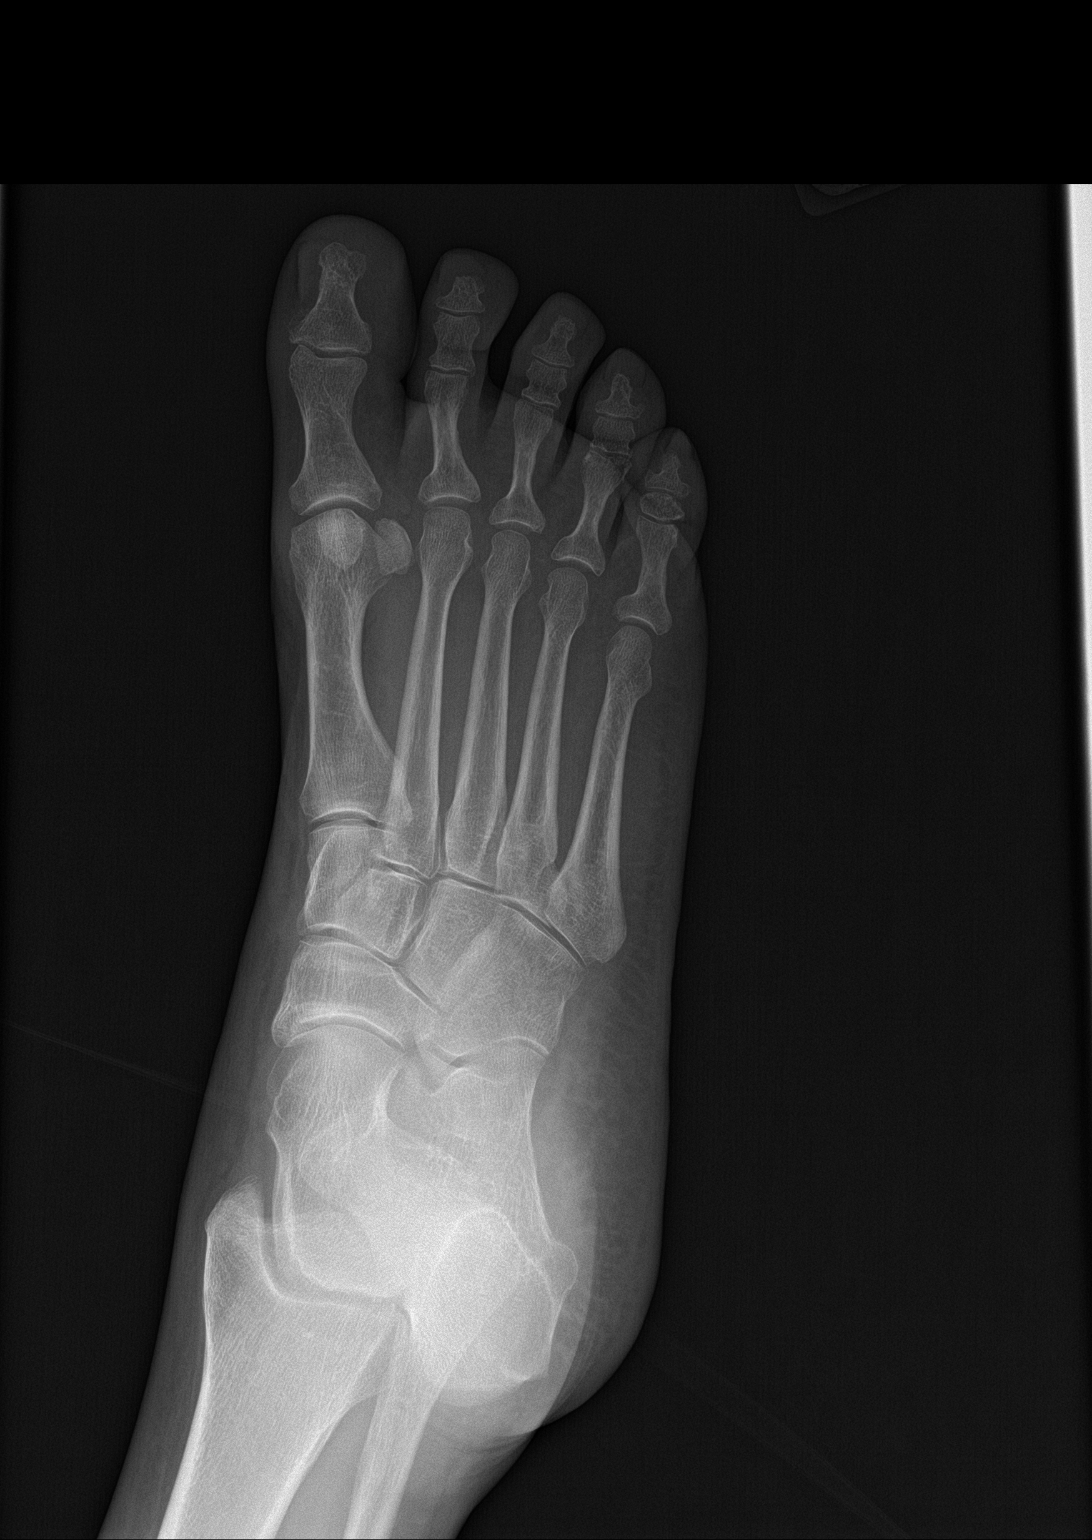

[foot lat]
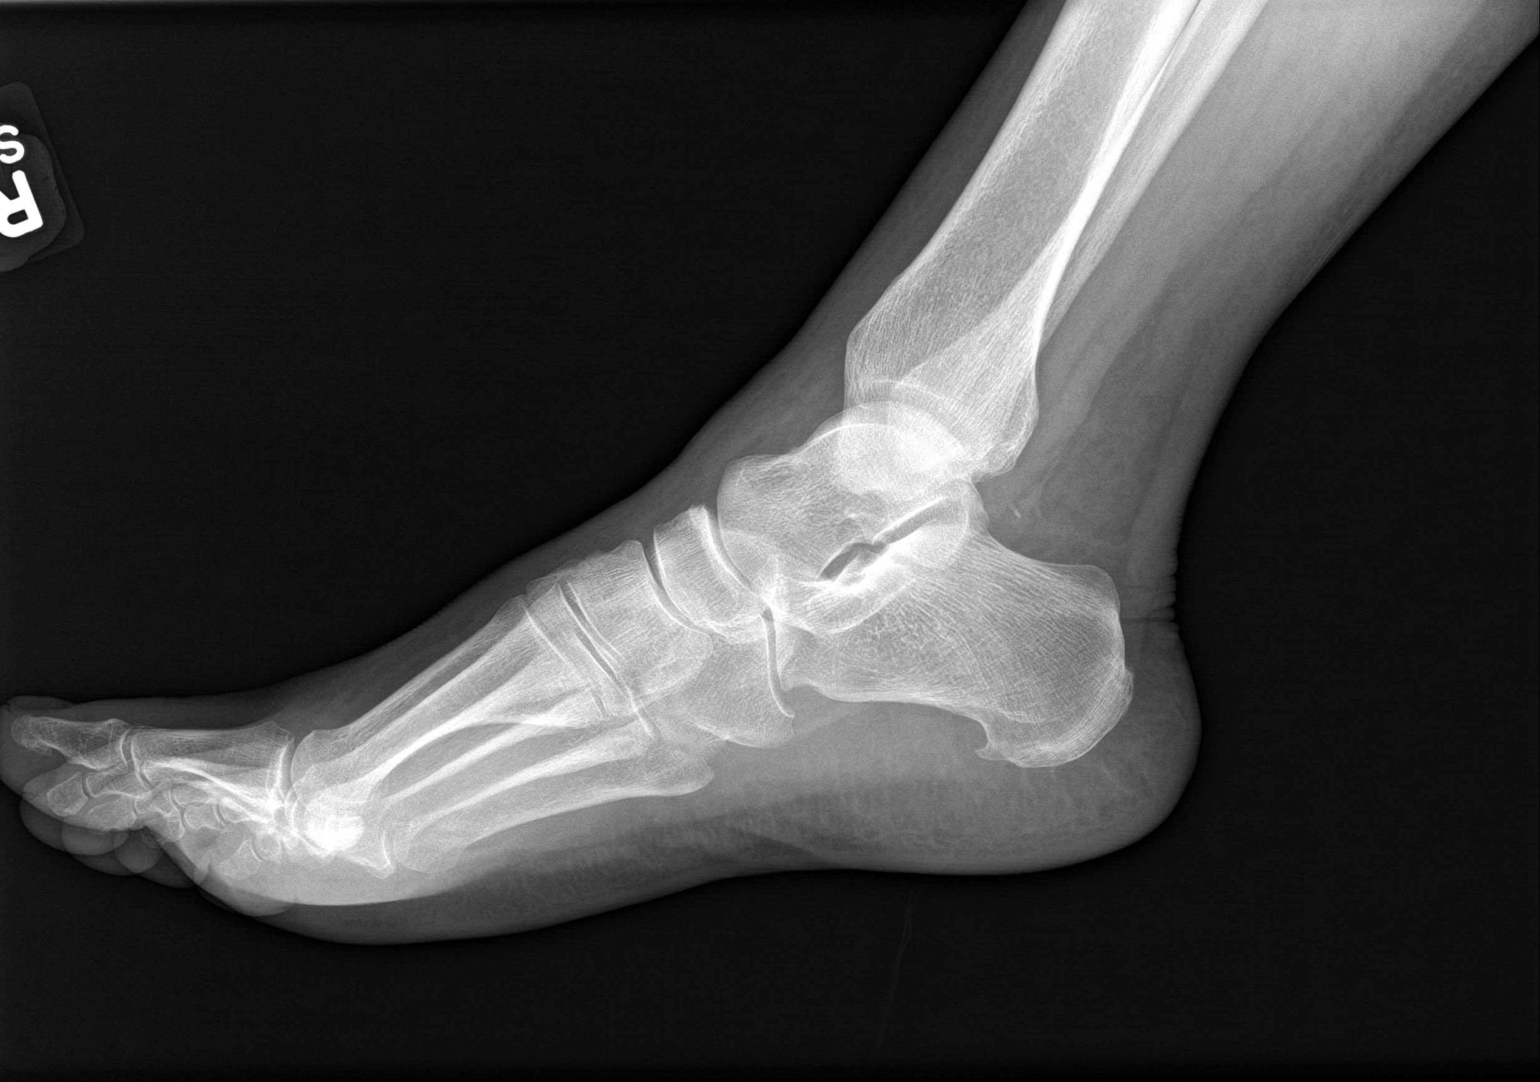

[3 of 3 positions shown; findings below may reference images not displayed]

FINDINGS: Mild irregularity of the distal aspect of the proximal phalanx of
fourth toe is again noted, likely the sequela of remote trauma.
Small plantar calcaneal heel spur is increased from prior. No acute
fracture is seen. No dislocation.
IMPRESSION: Small plantar calcaneal heel spur.

No acute fracture.

## 2023-02-18 ENCOUNTER — Other Ambulatory Visit: Payer: Self-pay | Admitting: Medical

## 2023-02-18 ENCOUNTER — Other Ambulatory Visit (HOSPITAL_BASED_OUTPATIENT_CLINIC_OR_DEPARTMENT_OTHER): Payer: Self-pay

## 2023-02-18 MED ORDER — FAMOTIDINE 20 MG PO TABS
20.0000 mg | ORAL_TABLET | Freq: Every day | ORAL | 0 refills | Status: DC
  Filled 2023-02-18: qty 30, 30d supply, fill #0

## 2023-02-27 ENCOUNTER — Other Ambulatory Visit (HOSPITAL_BASED_OUTPATIENT_CLINIC_OR_DEPARTMENT_OTHER): Payer: Self-pay

## 2023-02-27 ENCOUNTER — Ambulatory Visit (INDEPENDENT_AMBULATORY_CARE_PROVIDER_SITE_OTHER): Payer: Medicare Other

## 2023-02-27 DIAGNOSIS — Z23 Encounter for immunization: Secondary | ICD-10-CM

## 2023-02-27 MED ORDER — SHINGRIX 50 MCG/0.5ML IM SUSR
INTRAMUSCULAR | 1 refills | Status: DC
  Filled 2023-02-27: qty 0.5, 1d supply, fill #0
  Filled 2023-06-10: qty 0.5, 1d supply, fill #1

## 2023-03-01 ENCOUNTER — Other Ambulatory Visit (HOSPITAL_BASED_OUTPATIENT_CLINIC_OR_DEPARTMENT_OTHER): Payer: Self-pay

## 2023-03-12 ENCOUNTER — Encounter: Payer: Self-pay | Admitting: Medical

## 2023-03-15 ENCOUNTER — Other Ambulatory Visit: Payer: Self-pay

## 2023-03-15 MED ORDER — LOSARTAN POTASSIUM 50 MG PO TABS: 50.0000 mg | ORAL_TABLET | Freq: Every day | ORAL | 3 refills | Status: DC

## 2023-03-22 ENCOUNTER — Other Ambulatory Visit: Payer: Self-pay | Admitting: Medical

## 2023-04-15 ENCOUNTER — Other Ambulatory Visit: Payer: Self-pay | Admitting: Medical

## 2023-04-15 ENCOUNTER — Other Ambulatory Visit (HOSPITAL_BASED_OUTPATIENT_CLINIC_OR_DEPARTMENT_OTHER): Payer: Self-pay

## 2023-04-15 MED ORDER — FAMOTIDINE 20 MG PO TABS
20.0000 mg | ORAL_TABLET | Freq: Every day | ORAL | 0 refills | Status: DC
  Filled 2023-04-15: qty 30, 30d supply, fill #0

## 2023-04-24 ENCOUNTER — Other Ambulatory Visit (HOSPITAL_BASED_OUTPATIENT_CLINIC_OR_DEPARTMENT_OTHER): Payer: Self-pay

## 2023-05-10 NOTE — Telephone Encounter (Signed)
Received notice that pt has not read previous mychart message. Mailed letter.

## 2023-06-06 ENCOUNTER — Other Ambulatory Visit (HOSPITAL_BASED_OUTPATIENT_CLINIC_OR_DEPARTMENT_OTHER): Payer: Self-pay

## 2023-06-06 ENCOUNTER — Other Ambulatory Visit: Payer: Self-pay | Admitting: Medical

## 2023-06-07 LAB — LAB REPORT - SCANNED: Albumin/Creatinine Ratio, Urine, POC: 338

## 2023-06-10 ENCOUNTER — Other Ambulatory Visit (HOSPITAL_BASED_OUTPATIENT_CLINIC_OR_DEPARTMENT_OTHER): Payer: Self-pay

## 2023-07-08 ENCOUNTER — Other Ambulatory Visit (HOSPITAL_BASED_OUTPATIENT_CLINIC_OR_DEPARTMENT_OTHER): Payer: Self-pay

## 2023-07-24 ENCOUNTER — Ambulatory Visit: Payer: Medicare Other | Admitting: Medical

## 2023-07-26 ENCOUNTER — Ambulatory Visit (HOSPITAL_BASED_OUTPATIENT_CLINIC_OR_DEPARTMENT_OTHER)
Admission: RE | Admit: 2023-07-26 | Discharge: 2023-07-26 | Disposition: A | Discharge status: Home / Self Care | Payer: Medicare Other | Source: Ambulatory Visit | Attending: Medical | Admitting: Medical

## 2023-07-26 ENCOUNTER — Encounter: Payer: Self-pay | Admitting: Medical

## 2023-07-26 ENCOUNTER — Other Ambulatory Visit (HOSPITAL_BASED_OUTPATIENT_CLINIC_OR_DEPARTMENT_OTHER): Payer: Self-pay | Admitting: Medical

## 2023-07-26 ENCOUNTER — Other Ambulatory Visit (HOSPITAL_BASED_OUTPATIENT_CLINIC_OR_DEPARTMENT_OTHER): Payer: Self-pay

## 2023-07-26 ENCOUNTER — Ambulatory Visit: Payer: Medicare Other | Admitting: Medical

## 2023-07-26 VITALS — BP 185/90 | HR 77 | Resp 18 | Ht 62.0 in | Wt 158.8 lb

## 2023-07-26 DIAGNOSIS — E1165 Type 2 diabetes mellitus with hyperglycemia: Secondary | ICD-10-CM | POA: Diagnosis not present

## 2023-07-26 DIAGNOSIS — Z1211 Encounter for screening for malignant neoplasm of colon: Secondary | ICD-10-CM

## 2023-07-26 DIAGNOSIS — Z139 Encounter for screening, unspecified: Secondary | ICD-10-CM

## 2023-07-26 DIAGNOSIS — R944 Abnormal results of kidney function studies: Secondary | ICD-10-CM | POA: Diagnosis not present

## 2023-07-26 DIAGNOSIS — R059 Cough, unspecified: Secondary | ICD-10-CM | POA: Insufficient documentation

## 2023-07-26 LAB — COMPREHENSIVE METABOLIC PANEL
ALT: 9 U/L (ref 0–35)
AST: 15 U/L (ref 0–37)
Albumin: 4 g/dL (ref 3.5–5.2)
Alkaline Phosphatase: 97 U/L (ref 39–117)
BUN: 26 mg/dL — ABNORMAL HIGH (ref 6–23)
CO2: 27 meq/L (ref 19–32)
Calcium: 9.2 mg/dL (ref 8.4–10.5)
Chloride: 105 meq/L (ref 96–112)
Creatinine, Ser: 1.98 mg/dL — ABNORMAL HIGH (ref 0.40–1.20)
GFR: 25.07 mL/min — ABNORMAL LOW (ref >60.00)
Glucose, Bld: 88 mg/dL (ref 70–99)
Potassium: 3.5 meq/L (ref 3.5–5.1)
Sodium: 141 meq/L (ref 135–145)
Total Bilirubin: 0.5 mg/dL (ref 0.2–1.2)
Total Protein: 6.6 g/dL (ref 6.0–8.3)

## 2023-07-26 LAB — HEMOGLOBIN A1C: Hgb A1c MFr Bld: 5.8 % (ref 4.6–6.5)

## 2023-07-26 LAB — POC COVID19 BINAXNOW: SARS Coronavirus 2 Ag: NEGATIVE

## 2023-07-26 MED ORDER — BENZONATATE 100 MG PO CAPS
100.0000 mg | ORAL_CAPSULE | Freq: Three times a day (TID) | ORAL | 0 refills | Status: DC | PRN
  Filled 2023-07-26: qty 30, 10d supply, fill #0

## 2023-07-26 MED ORDER — AZITHROMYCIN 250 MG PO TABS
ORAL_TABLET | ORAL | 0 refills | Status: AC
  Filled 2023-07-26: qty 6, 5d supply, fill #0

## 2023-07-26 NOTE — Progress Notes (Signed)
Subjective:    Patient ID: Olivia Morales, female    DOB: 03-28-1951, 73 y.o.   MRN: [id number]  HPI  Pt in for follow up.  Hypertension associated with diabetes (HCC)  Increase your losartan to 50 mg daily. Continue toprol xl 25 mg daily,  amlodipine 10 mg daily and hctz 12.5    Gastroesophageal reflux disease, unspecified whether esophagitis present and Dysphagia, unspecified type Continue omeprazole and adding on famotadine. Gerd diet advised.  Get old colonsocopy report so you can get one if due.  Pt saw GI MD. Below In " recommendations.  "I discussed the nature of the recommended EGD, as well as the indications, risks, alternatives and potential complications including, but not limited to, bleeding, infection, reaction to medication, damage to internal organs, cardiac and/or pulmonary problems, and perforation requiring surgery (1 to 2 in 1000). The possibility that significant findings could be missed was explained. Any questions the patient had were answered. The patient gives consent for the procedure.:    Pt bp is very high initially. No cardiac or neurologic signs/symptoms.  Discussed the use of AI scribe software for clinical note transcription with the patient, who gave verbal consent to proceed.  History of Present Illness   The patient, with a history of hypertension, diabetes, and kidney disease, presents with a productive cough and headache. The cough started approximately twelve days ago, around the time the patient was helping clean a neighbor's house, which was noted to have significant mold. The cough is associated with yellow phlegm and occasional coughing spells. The patient also reports headaches, occurring approximately once a day.  The patient has not been checking their blood pressure during these headaches and admits to skipping their blood pressure medication the night before the consultation.   The patient also mentions a history of  gastroesophageal reflux disease (GERD) and is currently taking omeprazole at night, which has improved their symptoms. They have been recommended for an esophagogastroduodenoscopy (EGD) in the past but have not undergone the procedure. The patient also requests a referral for a colonoscopy, as it has been a few years since their last one.  The patient is established with a nephrologist and is due for a follow-up, particularly given recent lab results indicating microalbuminuria and a glomerular filtration rate (GFR) in the thirty range.        Past Medical History:  Diagnosis Date   Diabetes mellitus    Hypercholesteremia    Hypertension    Sleep apnea      Social History   Socioeconomic History   Marital status: Widowed    Spouse name: Not on file   Number of children: Not on file   Years of education: Not on file   Highest education level: Not on file  Occupational History   Not on file  Tobacco Use   Smoking status: Never   Smokeless tobacco: Never  Vaping Use   Vaping status: Never Used  Substance and Sexual Activity   Alcohol use: Yes    Comment: weekly   Drug use: No   Sexual activity: Not on file  Other Topics Concern   Not on file  Social History Narrative   Not on file   Social Drivers of Health   Financial Resource Strain: Low Risk  (06/05/2021)   Overall Financial Resource Strain (CARDIA)    Difficulty of Paying Living Expenses: Not hard at all  Food Insecurity: No Food Insecurity (08/23/2022)   Hunger Vital Sign    Worried  About Running Out of Food in the Last Year: Never true    Ran Out of Food in the Last Year: Never true  Transportation Needs: No Transportation Needs (08/23/2022)   PRAPARE - Administrator, Civil Service (Medical): No    Lack of Transportation (Non-Medical): No  Physical Activity: Inactive (06/05/2021)   Exercise Vital Sign    Days of Exercise per Week: 0 days    Minutes of Exercise per Session: 0 min  Stress: No Stress  Concern Present (06/05/2021)   [employer] of Occupational Health - Occupational Stress Questionnaire    Feeling of Stress : Not at all  Social Connections: Moderately Integrated (06/05/2021)   Social Connection and Isolation Panel [NHANES]    Frequency of Communication with Friends and Family: More than three times a week    Frequency of Social Gatherings with Friends and Family: More than three times a week    Attends Religious Services: More than 4 times per year    Active Member of [employer] or Organizations: Yes    Attends Banker Meetings: More than 4 times per year    Marital Status: Widowed  Intimate Partner Violence: Not At Risk (08/23/2022)   Humiliation, Afraid, Rape, and Kick questionnaire    Fear of Current or Ex-Partner: No    Emotionally Abused: No    Physically Abused: No    Sexually Abused: No    Past Surgical History:  Procedure Laterality Date   TUBAL LIGATION      Family History  Problem Relation Age of Onset   Sickle cell anemia Father     Allergies  Allergen Reactions   Ace Inhibitors     Cough    Current Outpatient Medications on File Prior to Visit  Medication Sig Dispense Refill   alendronate (FOSAMAX) 10 MG tablet Take 1 tablet (10 mg total) by mouth daily before breakfast. Take with a full glass of water on an empty stomach. 90 tablet 3   amLODipine (NORVASC) 10 MG tablet TAKE 1 TABLET BY MOUTH DAILY 100 tablet 2   atorvastatin (LIPITOR) 40 MG tablet TAKE 1 TABLET BY MOUTH DAILY 100 tablet 2   COVID-19 mRNA bivalent vaccine, Moderna, (MODERNA COVID-19 BIVAL BOOSTER) 50 MCG/0.5ML injection Inject into the muscle. 0.5 mL 0   Diclofenac Sodium (PENNSAID) 2 % SOLN Place 1 application onto the skin 2 (two) times daily. 112 g 2   dicyclomine (BENTYL) 20 MG tablet Take 1 tablet (20 mg total) by mouth 2 (two) times daily as needed for spasms. 10 tablet 0   diphenhydramine-acetaminophen (TYLENOL PM) 25-500 MG TABS Take 2 tablets by mouth every  4 (four) hours as needed (pain).     famotidine (PEPCID) 20 MG tablet Take 1 tablet (20 mg total) by mouth daily. 30 tablet 0   fluticasone (FLONASE) 50 MCG/ACT nasal spray USE 2 SPRAYS IN BOTH NOSTRILS  DAILY 32 g 0   hydrochlorothiazide (MICROZIDE) 12.5 MG capsule TAKE 1 CAPSULE BY MOUTH DAILY 100 capsule 2   influenza vaccine adjuvanted (FLUAD) 0.5 ML injection Inject into the muscle. 0.5 mL 0   Lancets (ONETOUCH DELICA PLUS LANCET33G) MISC USE TO TEST 1-2 TIMES DAILY 100 each 0   losartan (COZAAR) 50 MG tablet Take 1 tablet (50 mg total) by mouth daily. 90 tablet 3   magnesium oxide (MAG-OX) 400 (240 Mg) MG tablet Take 1 tablet by mouth once a day 120 tablet 5   magnesium oxide (MAG-OX) 400 MG tablet Take  1 tablet (400 mg total) by mouth daily. 30 tablet 5   meloxicam (MOBIC) 15 MG tablet Take 1 tablet (15 mg total) by mouth daily. 90 tablet 3   meloxicam (MOBIC) 15 MG tablet Take 1 tablet (15 mg total) by mouth daily. 90 tablet 3   metoprolol succinate (TOPROL-XL) 50 MG 24 hr tablet TAKE 1 TABLET BY MOUTH DAILY  WITH OR IMMEDIATELY FOLLOWING A  MEAL 100 tablet 2   omeprazole (PRILOSEC) 20 MG capsule TAKE 1 CAPSULE BY MOUTH DAILY 100 capsule 2   ondansetron (ZOFRAN ODT) 4 MG disintegrating tablet Take 1 tablet (4 mg total) by mouth every 8 (eight) hours as needed for nausea or vomiting. 15 tablet 0   ONETOUCH VERIO test strip Check blood sugar 1-2 times daily 100 each 12   TRULICITY 0.75 MG/0.5ML SOPN INJECT THE CONTENTS OF ONE PEN  SUBCUTANEOUSLY WEEKLY AS  DIRECTED 6 mL 3   Zoster Vaccine Adjuvanted [facility]) injection Inject into the muscle. 0.5 mL 1   No current facility-administered medications on file prior to visit.    BP (!) 185/90   Pulse 77   Resp 18   Ht 5\' 2"  (1.575 m)   Wt 158 lb 12.8 oz (72 kg)   SpO2 98%   BMI 29.04 kg/m        Review of Systems  Constitutional:  Negative for chills and fever.  HENT:  Positive for congestion and sinus pressure. Negative for ear  pain, sinus pain and sore throat.   Respiratory:  Positive for cough. Negative for wheezing.   Cardiovascular:  Negative for chest pain and palpitations.  Gastrointestinal:  Negative for abdominal pain.  Genitourinary:  Negative for dysuria.  Musculoskeletal:  Negative for back pain and myalgias.  Neurological:  Negative for dizziness, speech difficulty, weakness and light-headedness.  Hematological:  Negative for adenopathy. Does not bruise/bleed easily.  Psychiatric/Behavioral:  Positive for hallucinations. Negative for behavioral problems.     Past Medical History:  Diagnosis Date   Diabetes mellitus    Hypercholesteremia    Hypertension    Sleep apnea      Social History   Socioeconomic History   Marital status: Widowed    Spouse name: Not on file   Number of children: Not on file   Years of education: Not on file   Highest education level: Not on file  Occupational History   Not on file  Tobacco Use   Smoking status: Never   Smokeless tobacco: Never  Vaping Use   Vaping status: Never Used  Substance and Sexual Activity   Alcohol use: Yes    Comment: weekly   Drug use: No   Sexual activity: Not on file  Other Topics Concern   Not on file  Social History Narrative   Not on file   Social Drivers of Health   Financial Resource Strain: Low Risk  (06/05/2021)   Overall Financial Resource Strain (CARDIA)    Difficulty of Paying Living Expenses: Not hard at all  Food Insecurity: No Food Insecurity (08/23/2022)   Hunger Vital Sign    Worried About Running Out of Food in the Last Year: Never true    Ran Out of Food in the Last Year: Never true  Transportation Needs: No Transportation Needs (08/23/2022)   PRAPARE - Administrator, Civil Service (Medical): No    Lack of Transportation (Non-Medical): No  Physical Activity: Inactive (06/05/2021)   Exercise Vital Sign    Days of Exercise  per Week: 0 days    Minutes of Exercise per Session: 0 min  Stress: No  Stress Concern Present (06/05/2021)   [employer] of Occupational Health - Occupational Stress Questionnaire    Feeling of Stress : Not at all  Social Connections: Moderately Integrated (06/05/2021)   Social Connection and Isolation Panel [NHANES]    Frequency of Communication with Friends and Family: More than three times a week    Frequency of Social Gatherings with Friends and Family: More than three times a week    Attends Religious Services: More than 4 times per year    Active Member of [employer] or Organizations: Yes    Attends Banker Meetings: More than 4 times per year    Marital Status: Widowed  Intimate Partner Violence: Not At Risk (08/23/2022)   Humiliation, Afraid, Rape, and Kick questionnaire    Fear of Current or Ex-Partner: No    Emotionally Abused: No    Physically Abused: No    Sexually Abused: No    Past Surgical History:  Procedure Laterality Date   TUBAL LIGATION      Family History  Problem Relation Age of Onset   Sickle cell anemia Father     Allergies  Allergen Reactions   Ace Inhibitors     Cough    Current Outpatient Medications on File Prior to Visit  Medication Sig Dispense Refill   alendronate (FOSAMAX) 10 MG tablet Take 1 tablet (10 mg total) by mouth daily before breakfast. Take with a full glass of water on an empty stomach. 90 tablet 3   amLODipine (NORVASC) 10 MG tablet TAKE 1 TABLET BY MOUTH DAILY 100 tablet 2   atorvastatin (LIPITOR) 40 MG tablet TAKE 1 TABLET BY MOUTH DAILY 100 tablet 2   COVID-19 mRNA bivalent vaccine, Moderna, (MODERNA COVID-19 BIVAL BOOSTER) 50 MCG/0.5ML injection Inject into the muscle. 0.5 mL 0   Diclofenac Sodium (PENNSAID) 2 % SOLN Place 1 application onto the skin 2 (two) times daily. 112 g 2   dicyclomine (BENTYL) 20 MG tablet Take 1 tablet (20 mg total) by mouth 2 (two) times daily as needed for spasms. 10 tablet 0   diphenhydramine-acetaminophen (TYLENOL PM) 25-500 MG TABS Take 2 tablets by mouth  every 4 (four) hours as needed (pain).     famotidine (PEPCID) 20 MG tablet Take 1 tablet (20 mg total) by mouth daily. 30 tablet 0   fluticasone (FLONASE) 50 MCG/ACT nasal spray USE 2 SPRAYS IN BOTH NOSTRILS  DAILY 32 g 0   hydrochlorothiazide (MICROZIDE) 12.5 MG capsule TAKE 1 CAPSULE BY MOUTH DAILY 100 capsule 2   influenza vaccine adjuvanted (FLUAD) 0.5 ML injection Inject into the muscle. 0.5 mL 0   Lancets (ONETOUCH DELICA PLUS LANCET33G) MISC USE TO TEST 1-2 TIMES DAILY 100 each 0   losartan (COZAAR) 50 MG tablet Take 1 tablet (50 mg total) by mouth daily. 90 tablet 3   magnesium oxide (MAG-OX) 400 (240 Mg) MG tablet Take 1 tablet by mouth once a day 120 tablet 5   magnesium oxide (MAG-OX) 400 MG tablet Take 1 tablet (400 mg total) by mouth daily. 30 tablet 5   meloxicam (MOBIC) 15 MG tablet Take 1 tablet (15 mg total) by mouth daily. 90 tablet 3   meloxicam (MOBIC) 15 MG tablet Take 1 tablet (15 mg total) by mouth daily. 90 tablet 3   metoprolol succinate (TOPROL-XL) 50 MG 24 hr tablet TAKE 1 TABLET BY MOUTH DAILY  WITH OR  IMMEDIATELY FOLLOWING A  MEAL 100 tablet 2   omeprazole (PRILOSEC) 20 MG capsule TAKE 1 CAPSULE BY MOUTH DAILY 100 capsule 2   ondansetron (ZOFRAN ODT) 4 MG disintegrating tablet Take 1 tablet (4 mg total) by mouth every 8 (eight) hours as needed for nausea or vomiting. 15 tablet 0   ONETOUCH VERIO test strip Check blood sugar 1-2 times daily 100 each 12   TRULICITY 0.75 MG/0.5ML SOPN INJECT THE CONTENTS OF ONE PEN  SUBCUTANEOUSLY WEEKLY AS  DIRECTED 6 mL 3   Zoster Vaccine Adjuvanted [facility]) injection Inject into the muscle. 0.5 mL 1   No current facility-administered medications on file prior to visit.    BP (!) 185/90   Pulse 77   Resp 18   Ht 5\' 2"  (1.575 m)   Wt 158 lb 12.8 oz (72 kg)   SpO2 98%   BMI 29.04 kg/m        Objective:   Physical Exam  General Mental Status- Alert. General Appearance- Not in acute distress.   Skin General: Color-  Normal Color. Moisture- Normal Moisture.  Neck Carotid Arteries- Normal color. Moisture- Normal Moisture. No carotid bruits. No JVD.  Chest and Lung Exam Auscultation: Breath Sounds:-Normal.  Cardiovascular Auscultation:Rythm- Regular. Murmurs & Other Heart Sounds:Auscultation of the heart reveals- No Murmurs.  Abdomen Inspection:-Inspeection Normal. Palpation/Percussion:Note:No mass. Palpation and Percussion of the abdomen reveal- Non Tender, Non Distended + BS, no rebound or guarding.   Neurologic Cranial Nerve exam:- CN III-XII intact(No nystagmus), symmetric smile. Finger to Nose:- Normal/Intact Strength:- 5/5 equal and symmetric strength both upper and lower extremities.       Assessment & Plan:   Patient Instructions  Hypertension Elevated blood pressure in the setting of missed antihypertensive medications. No current headache or chest pain. -Resume daily antihypertensive medications:Losartan 50mg , Toprol XL 25mg , Amlodipine 10mg , and HCTZ 12.5mg . -Check blood pressure at home on 07/28/2023 and 07/29/2023 and send results via MyChart. - Any cardiac or neurologic signs or symptoms be seen in ED.  Bronchitis Productive cough and fatigue for 12 days after exposure to mold and a neighbor with pneumonia. -Order chest x-ray. -Prescribe Azithromycin for suspected bronchitis. Benzonatate for cough -Perform COVID-19 swab. Study was negative.  Gastroesophageal Reflux Disease (GERD) Symptoms improved with nightly Omeprazole 20mg . -Continue Omeprazole 20mg  at night. -pt decided not to do EGD  Chronic Kidney Disease and diabetes Previous microalbuminuria and GFR elevated -Order metabolic panel and A1c. -Recommend follow-up with nephrologist.  Colonoscopy Request for repeat colonoscopy. -Refer to [facility] for colonoscopy.  Follow-up To be determined based on lab results and chest x-ray findings.   Esperanza Richters, PA-C   Time spent with patient today was  41  minutes which consisted of chart review, discussing diagnosis, work up treatment and documentation.

## 2023-07-26 NOTE — Patient Instructions (Signed)
Hypertension Elevated blood pressure in the setting of missed antihypertensive medications. No current headache or chest pain. -Resume daily antihypertensive medications:Losartan 50mg , Toprol XL 25mg , Amlodipine 10mg , and HCTZ 12.5mg . -Check blood pressure at home on 07/28/2023 and 07/29/2023 and send results via MyChart. - Any cardiac or neurologic signs or symptoms be seen in ED.  Bronchitis Productive cough and fatigue for 12 days after exposure to mold and a neighbor with pneumonia. -Order chest x-ray. -Prescribe Azithromycin for suspected bronchitis. Benzonatate for cough -Perform COVID-19 swab. Study was negative.  Gastroesophageal Reflux Disease (GERD) Symptoms improved with nightly Omeprazole 20mg . -Continue Omeprazole 20mg  at night. -pt decided not to do EGD  Chronic Kidney Disease and diabetes Previous microalbuminuria and GFR elevated -Order metabolic panel and A1c. -Recommend follow-up with nephrologist.  Colonoscopy Request for repeat colonoscopy. -Refer to [facility] for colonoscopy.  Follow-up To be determined based on lab results and chest x-ray findings.

## 2023-07-27 ENCOUNTER — Encounter: Payer: Self-pay | Admitting: Medical

## 2023-07-30 ENCOUNTER — Ambulatory Visit (HOSPITAL_BASED_OUTPATIENT_CLINIC_OR_DEPARTMENT_OTHER): Payer: Medicare Other

## 2023-07-31 ENCOUNTER — Other Ambulatory Visit (HOSPITAL_BASED_OUTPATIENT_CLINIC_OR_DEPARTMENT_OTHER): Payer: Self-pay

## 2023-07-31 MED ORDER — PNEUMOCOCCAL 21-VAL CONJ VACC 0.5 ML IM SOSY
0.5000 mL | PREFILLED_SYRINGE | Freq: Once | INTRAMUSCULAR | 0 refills | Status: AC
  Filled 2023-07-31: qty 0.5, 1d supply, fill #0

## 2023-08-01 ENCOUNTER — Other Ambulatory Visit (HOSPITAL_BASED_OUTPATIENT_CLINIC_OR_DEPARTMENT_OTHER): Payer: Self-pay

## 2023-08-07 ENCOUNTER — Other Ambulatory Visit: Payer: Self-pay | Admitting: Medical

## 2023-08-13 ENCOUNTER — Encounter: Payer: Self-pay | Admitting: Medical

## 2023-08-13 ENCOUNTER — Other Ambulatory Visit (HOSPITAL_BASED_OUTPATIENT_CLINIC_OR_DEPARTMENT_OTHER): Payer: Self-pay

## 2023-08-13 ENCOUNTER — Ambulatory Visit: Payer: Medicare Other | Admitting: Medical

## 2023-08-13 VITALS — BP 150/80 | HR 55

## 2023-08-13 DIAGNOSIS — R944 Abnormal results of kidney function studies: Secondary | ICD-10-CM

## 2023-08-13 DIAGNOSIS — E559 Vitamin D deficiency, unspecified: Secondary | ICD-10-CM | POA: Diagnosis not present

## 2023-08-13 DIAGNOSIS — R350 Frequency of micturition: Secondary | ICD-10-CM

## 2023-08-13 DIAGNOSIS — D649 Anemia, unspecified: Secondary | ICD-10-CM

## 2023-08-13 DIAGNOSIS — R5383 Other fatigue: Secondary | ICD-10-CM

## 2023-08-13 DIAGNOSIS — R110 Nausea: Secondary | ICD-10-CM

## 2023-08-13 DIAGNOSIS — R102 Pelvic and perineal pain: Secondary | ICD-10-CM

## 2023-08-13 DIAGNOSIS — R059 Cough, unspecified: Secondary | ICD-10-CM

## 2023-08-13 LAB — CBC WITH DIFFERENTIAL/PLATELET
Basophils Absolute: 0 10*3/uL (ref 0.0–0.1)
Basophils Relative: 1.2 % (ref 0.0–3.0)
Eosinophils Absolute: 0.1 10*3/uL (ref 0.0–0.7)
Eosinophils Relative: 2.6 % (ref 0.0–5.0)
HCT: 31.9 % — ABNORMAL LOW (ref 36.0–46.0)
Hemoglobin: 10.5 g/dL — ABNORMAL LOW (ref 12.0–15.0)
Lymphocytes Relative: 31.2 % (ref 12.0–46.0)
Lymphs Abs: 1.2 10*3/uL (ref 0.7–4.0)
MCHC: 33 g/dL (ref 30.0–36.0)
MCV: 84.2 fL (ref 78.0–100.0)
Monocytes Absolute: 0.5 10*3/uL (ref 0.1–1.0)
Monocytes Relative: 12.1 % — ABNORMAL HIGH (ref 3.0–12.0)
Neutro Abs: 2 10*3/uL (ref 1.4–7.7)
Neutrophils Relative %: 52.9 % (ref 43.0–77.0)
Platelets: 324 10*3/uL (ref 150.0–400.0)
RBC: 3.79 Mil/uL — ABNORMAL LOW (ref 3.87–5.11)
RDW: 13.2 % (ref 11.5–15.5)
WBC: 3.7 10*3/uL — ABNORMAL LOW (ref 4.0–10.5)

## 2023-08-13 LAB — VITAMIN D 25 HYDROXY (VIT D DEFICIENCY, FRACTURES): VITD: 8.71 ng/mL — ABNORMAL LOW (ref 30.00–100.00)

## 2023-08-13 LAB — VITAMIN B12: Vitamin B-12: 233 pg/mL (ref 211–911)

## 2023-08-13 LAB — T4, FREE: Free T4: 0.91 ng/dL (ref 0.60–1.60)

## 2023-08-13 LAB — TSH: TSH: 1.95 u[IU]/mL (ref 0.35–5.50)

## 2023-08-13 LAB — LIPASE: Lipase: 65 U/L — ABNORMAL HIGH (ref 11.0–59.0)

## 2023-08-13 MED ORDER — BENZONATATE 100 MG PO CAPS: 100.0000 mg | ORAL_CAPSULE | Freq: Three times a day (TID) | ORAL | 0 refills | Status: DC | PRN

## 2023-08-13 NOTE — Patient Instructions (Addendum)
Post-Bronchitis Cough Persistent cough and fatigue following a diagnosis of bronchitis. No improvement with azithromycin. no pneumonia on xray. -Continue benzonatate as needed for cough. -Encourage hydration and use of lozenges.  Anemia History of anemia with current symptoms of fatigue and weakness. -Order CBC and iron panel to assess current status.  Hypertension Blood pressure slightly elevated despite adherence to current regimen (Losartan 50mg , Toprol XL 25mg , Amlodipine 10mg , HCTZ 12.5mg  daily). -Consider medication adjustment pending lab results.  Frequent Urination Reports of frequent urination and incontinence. -Order urine culture to rule out urinary tract infection.  Right Lower Quadrant Pain/adnexal pain on exam. Pt did not note any pain recently but did not on palpaiton. Reports of tenderness in the right lower quadrant, potentially over the ovary. -Refer to gynecologist (Dr. Adrian Blackwater) for further evaluation. -No pain until i examined. if pain in area worsens or other symptoms pending appt with gyn let me know. -Explained want to know if they call you by friday. If gyn does not call you by friday let me know. Considering may benefit from transvaginal US.  Vitamin D Deficiency History of vitamin D deficiency. -Order vitamin D level.  Decreased Kidney Function Decreased GFR noted on previous labs. -Refer to nephrologist again as have done numerous times.[phone]). On review pt states was called 6 months ago by one but she did not call them back?   General Health Maintenance -Order additional labs including B12, B1, TSH, T4 to further evaluate fatigue.  -Follow-up appointment to be determined based on lab results.  Today 08/13/2022 bp at home 162/68, 159/70, 161/75, 164/74 and 169/78.

## 2023-08-13 NOTE — Addendum Note (Signed)
Addended by: Gwenevere Abbot on: 08/13/2023 08:56 PM   Modules accepted: Orders

## 2023-08-13 NOTE — Progress Notes (Signed)
Subjective:    Patient ID: Olivia Morales, female    DOB: 1950-08-11, 73 y.o.   MRN: [id number]  HPI  Pricila Williams Che is a 73 year old female with hypertension who presents with persistent cough and fatigue.   She has been experiencing a persistent cough producing thick phlegm for over two weeks, accompanied by lightheadedness during coughing episodes. Her energy levels have not returned to normal since the onset of symptoms. She was previously diagnosed with bronchitis and treated with azithromycin and cough tablets, but her symptoms persist. Though admits cough is gradually improving. Less productive. CXR did not show pneumonia.  She has a history of anemia and is concerned about her iron levels, especially given her father's history of sickle cell anemia. She recalls being anemic in the past and questions whether her iron levels have been checked recently. She feels weak and 'funny', which she associates with her anemia history. In the past had placed future orders repeat cbc and iron panel. looks like those studies were not done.  She has experienced nausea and vomiting recently, with a loss of appetite and taste. She mentions difficulty eating certain foods and has been primarily consuming soup. No alcohol consumption and no heartburn due to taking omeprazole.  She reports frequent urination and occasional sensitivity in the genital area, which she attributes to soap irritation. No burning during urination.  She has a history of hypertension and is currently on losartan 50 mg, toprol XL 25 mg, amlodipine 10 mg, and HCTZ 12.5 mg daily. Her blood pressure was very high during a previous visit but has improved since then. She ensures she is well-hydrated, having switched from sodas to water, cranberry juice, and Gatorade.  She mentions a history of vitamin D deficiency and has been on vitamin D3 supplements in the past. She has a supply of these supplements at home.    Review  of Systems  Constitutional:  Positive for fatigue.  HENT:  Negative for congestion.   Respiratory:  Positive for cough. Negative for chest tightness, shortness of breath and wheezing.   Cardiovascular:  Negative for chest pain and palpitations.  Gastrointestinal:  Positive for nausea. Negative for abdominal distention and abdominal pain.       See hpi  Genitourinary:  Positive for frequency. Negative for flank pain.  Musculoskeletal:  Negative for back pain.  Skin:  Negative for rash.  Neurological:  Negative for dizziness, weakness and light-headedness.  Hematological:  Negative for adenopathy. Does not bruise/bleed easily.  Psychiatric/Behavioral:  Negative for behavioral problems, dysphoric mood and suicidal ideas.     Past Medical History:  Diagnosis Date   Diabetes mellitus    Hypercholesteremia    Hypertension    Sickle cell trait (HCC)    Sleep apnea      Social History   Socioeconomic History   Marital status: Widowed    Spouse name: Not on file   Number of children: Not on file   Years of education: Not on file   Highest education level: Not on file  Occupational History   Not on file  Tobacco Use   Smoking status: Never   Smokeless tobacco: Never  Vaping Use   Vaping status: Never Used  Substance and Sexual Activity   Alcohol use: Yes    Comment: weekly   Drug use: No   Sexual activity: Not on file  Other Topics Concern   Not on file  Social History Narrative   Not on file   Social  Drivers of Health   Financial Resource Strain: Low Risk  (06/05/2021)   Overall Financial Resource Strain (CARDIA)    Difficulty of Paying Living Expenses: Not hard at all  Food Insecurity: No Food Insecurity (08/23/2022)   Hunger Vital Sign    Worried About Running Out of Food in the Last Year: Never true    Ran Out of Food in the Last Year: Never true  Transportation Needs: No Transportation Needs (08/23/2022)   PRAPARE - Administrator, Civil Service  (Medical): No    Lack of Transportation (Non-Medical): No  Physical Activity: Inactive (06/05/2021)   Exercise Vital Sign    Days of Exercise per Week: 0 days    Minutes of Exercise per Session: 0 min  Stress: No Stress Concern Present (06/05/2021)   [employer] of Occupational Health - Occupational Stress Questionnaire    Feeling of Stress : Not at all  Social Connections: Moderately Integrated (06/05/2021)   Social Connection and Isolation Panel [NHANES]    Frequency of Communication with Friends and Family: More than three times a week    Frequency of Social Gatherings with Friends and Family: More than three times a week    Attends Religious Services: More than 4 times per year    Active Member of [employer] or Organizations: Yes    Attends Banker Meetings: More than 4 times per year    Marital Status: Widowed  Intimate Partner Violence: Not At Risk (08/23/2022)   Humiliation, Afraid, Rape, and Kick questionnaire    Fear of Current or Ex-Partner: No    Emotionally Abused: No    Physically Abused: No    Sexually Abused: No    Past Surgical History:  Procedure Laterality Date   TUBAL LIGATION      Family History  Problem Relation Age of Onset   Sickle cell anemia Father     Allergies  Allergen Reactions   Ace Inhibitors     Cough    Current Outpatient Medications on File Prior to Visit  Medication Sig Dispense Refill   alendronate (FOSAMAX) 10 MG tablet TAKE 1 TABLET BY MOUTH DAILY  BEFORE BREAKFAST WITH A FULL  GLASS OF WATER ON AN EMPTY  STOMACH 100 tablet 2   amLODipine (NORVASC) 10 MG tablet TAKE 1 TABLET BY MOUTH DAILY 100 tablet 2   atorvastatin (LIPITOR) 40 MG tablet TAKE 1 TABLET BY MOUTH DAILY 100 tablet 2   COVID-19 mRNA bivalent vaccine, Moderna, (MODERNA COVID-19 BIVAL BOOSTER) 50 MCG/0.5ML injection Inject into the muscle. 0.5 mL 0   Diclofenac Sodium (PENNSAID) 2 % SOLN Place 1 application onto the skin 2 (two) times daily. 112 g 2    dicyclomine (BENTYL) 20 MG tablet Take 1 tablet (20 mg total) by mouth 2 (two) times daily as needed for spasms. 10 tablet 0   diphenhydramine-acetaminophen (TYLENOL PM) 25-500 MG TABS Take 2 tablets by mouth every 4 (four) hours as needed (pain).     famotidine (PEPCID) 20 MG tablet Take 1 tablet (20 mg total) by mouth daily. 30 tablet 0   fluticasone (FLONASE) 50 MCG/ACT nasal spray USE 2 SPRAYS IN BOTH NOSTRILS  DAILY 32 g 0   hydrochlorothiazide (MICROZIDE) 12.5 MG capsule TAKE 1 CAPSULE BY MOUTH DAILY 100 capsule 2   influenza vaccine adjuvanted (FLUAD) 0.5 ML injection Inject into the muscle. 0.5 mL 0   Lancets (ONETOUCH DELICA PLUS LANCET33G) MISC USE TO TEST 1-2 TIMES DAILY 100 each 0   magnesium  oxide (MAG-OX) 400 (240 Mg) MG tablet Take 1 tablet by mouth once a day 120 tablet 5   magnesium oxide (MAG-OX) 400 MG tablet Take 1 tablet (400 mg total) by mouth daily. 30 tablet 5   meloxicam (MOBIC) 15 MG tablet Take 1 tablet (15 mg total) by mouth daily. 90 tablet 3   meloxicam (MOBIC) 15 MG tablet Take 1 tablet (15 mg total) by mouth daily. 90 tablet 3   metoprolol succinate (TOPROL-XL) 50 MG 24 hr tablet TAKE 1 TABLET BY MOUTH DAILY  WITH OR IMMEDIATELY FOLLOWING A  MEAL 100 tablet 2   omeprazole (PRILOSEC) 20 MG capsule TAKE 1 CAPSULE BY MOUTH DAILY 100 capsule 2   ondansetron (ZOFRAN ODT) 4 MG disintegrating tablet Take 1 tablet (4 mg total) by mouth every 8 (eight) hours as needed for nausea or vomiting. 15 tablet 0   ONETOUCH VERIO test strip Check blood sugar 1-2 times daily 100 each 12   TRULICITY 0.75 MG/0.5ML SOPN INJECT THE CONTENTS OF ONE PEN  SUBCUTANEOUSLY WEEKLY AS  DIRECTED 6 mL 3   Zoster Vaccine Adjuvanted [facility]) injection Inject into the muscle. 0.5 mL 1   losartan (COZAAR) 50 MG tablet Take 1 tablet (50 mg total) by mouth daily. 90 tablet 3   No current facility-administered medications on file prior to visit.    BP (!) 150/80   Pulse (!) 55   SpO2 100%         Objective:   Physical Exam  General Mental Status- Alert. General Appearance- Not in acute distress.   Skin General: Color- Normal Color. Moisture- Normal Moisture.  Neck Carotid Arteries- Normal color. Moisture- Normal Moisture. No carotid bruits. No JVD.  Chest and Lung Exam Auscultation: Breath Sounds:-Normal.  Cardiovascular Auscultation:Rythm- Regular. Murmurs & Other Heart Sounds:Auscultation of the heart reveals- No Murmurs.  Abdomen Inspection:-Inspeection Normal. Palpation/Percussion:Note:No mass. Palpation and Percussion of the abdomen reveal- Non Tender, Non Distended + BS, no rebound or guarding. -rlq/adnexal area mild tender and feels mild hard in that region. No obvious hernia felt. No obvious mass.  Back- no cva tenderness.  Neurologic Cranial Nerve exam:- CN III-XII intact(No nystagmus), symmetric smile. tric strength both upper and lower extremities.      Assessment & Plan:   Patient Instructions  Post-Bronchitis Cough Persistent cough and fatigue following a diagnosis of bronchitis. No improvement with azithromycin. no pneumonia on xray. -Continue benzonatate as needed for cough. -Encourage hydration and use of lozenges.  Anemia History of anemia with current symptoms of fatigue and weakness. -Order CBC and iron panel to assess current status.  Hypertension Blood pressure slightly elevated despite adherence to current regimen (Losartan 50mg , Toprol XL 25mg , Amlodipine 10mg , HCTZ 12.5mg  daily). -Consider medication adjustment pending lab results.  Frequent Urination Reports of frequent urination and incontinence. -Order urine culture to rule out urinary tract infection.  Right Lower Quadrant Pain Reports of tenderness in the right lower quadrant, potentially over the ovary. -Refer to gynecologist (Dr. Adrian Blackwater) for further evaluation. -no pain until i examined. if pain in area worsens or other symptoms pending appt with gyn let me  know.  Vitamin D Deficiency History of vitamin D deficiency. -Order vitamin D level.  Decreased Kidney Function Decreased GFR noted on previous labs. -Refer to nephrologist.([phone])   General Health Maintenance -Order additional labs including B12, B1, TSH, T4 to further evaluate fatigue.  -Follow-up appointment to be determined based on lab results.   Esperanza Richters, PA-C   Time spent with patient today  was  45 minutes which consisted of chart revdiew, discussing diagnosis, work up treatment and documentation.

## 2023-08-14 ENCOUNTER — Ambulatory Visit: Payer: Self-pay | Admitting: Medical

## 2023-08-14 ENCOUNTER — Ambulatory Visit (INDEPENDENT_AMBULATORY_CARE_PROVIDER_SITE_OTHER): Payer: Medicare Other

## 2023-08-14 ENCOUNTER — Ambulatory Visit: Payer: Medicare Other

## 2023-08-14 DIAGNOSIS — R944 Abnormal results of kidney function studies: Secondary | ICD-10-CM | POA: Diagnosis not present

## 2023-08-14 LAB — COMPREHENSIVE METABOLIC PANEL
ALT: 9 U/L (ref 0–35)
AST: 18 U/L (ref 0–37)
Albumin: 3.9 g/dL (ref 3.5–5.2)
Alkaline Phosphatase: 93 U/L (ref 39–117)
BUN: 34 mg/dL — ABNORMAL HIGH (ref 6–23)
CO2: 28 meq/L (ref 19–32)
Calcium: 8.6 mg/dL (ref 8.4–10.5)
Chloride: 103 meq/L (ref 96–112)
Creatinine, Ser: 2.74 mg/dL — ABNORMAL HIGH (ref 0.40–1.20)
GFR: 16.97 mL/min — ABNORMAL LOW (ref >60.00)
Glucose, Bld: 78 mg/dL (ref 70–99)
Potassium: 3.6 meq/L (ref 3.5–5.1)
Sodium: 142 meq/L (ref 135–145)
Total Bilirubin: 0.4 mg/dL (ref 0.2–1.2)
Total Protein: 6.8 g/dL (ref 6.0–8.3)

## 2023-08-14 LAB — URINE CULTURE
MICRO NUMBER:: 16069339
Result:: NO GROWTH
SPECIMEN QUALITY:: ADEQUATE

## 2023-08-14 NOTE — Telephone Encounter (Signed)
Copied from CRM [id number]. Topic: Clinical - Red Word Triage >> Aug 14, 2023  2:26 PM [name] wrote: [facility] that prompted transfer to Nurse Triage: Patient's blood pressure today has been 162/68 at 1:45 pm, 159/70 at 2pm, and 161/75 at 2:15 pm. Patient has not been experiencing other symptoms with her elevated BP, besides nervousness from her lab report.   Additional Notes: Patient called and advised that the PCP office wanted her to call them back  Patient states that she has been to her PCP two or three times in the past few weeks.  She was exposed to pneumonia and had lab work and a chest x ray from our PCP office.  She states that the neighbor she was exposed from also had mold in their home. She has experiencing nausea, coughing up phlegm,  Patient states that she has been taking her medication appropriately for the past few days.  She said a nurse from the office called and said she needed to come in for the next few months for B12 shots.  Patient states that she has been communicating with Dr Alvira Monday via MyChart messages and he wanted her to call back with the blood pressure readings. Patient started to get very upset at this time and said she is sitting there upset, waiting on a phone call from the office, and nobody has called her back.  She is upset because she is talking to people who don't know what is going on and she wants to know more about her abnormal lab work because it is scaring her. Called [facility] at this time to see if someone there could speak to the patient because she just wants to be connected to the PCP office. [facility] advised this RN to connect the patient through.  Patient was connected with Antonio from the office at this time.   Reason for Disposition . [1] Overly worried caller AND [2] can't be reassured by triager . [1] Caller requesting NON-URGENT health information AND [2] PCP's office is the best resource  Answer Assessment - Initial Assessment Questions 1.  REASON FOR CALL or QUESTION: "What is your reason for calling today?" or "How can I best help you?" or "What question do you have that I can help answer?"     Patient trying to get back in touch with her PCP office about blood pressure readings as per requested from her PCP through messenger  Protocols used: Difficult Call-A-AH, Information Only Call - No Triage-A-AH

## 2023-08-14 NOTE — Telephone Encounter (Signed)
Can you help me with this referral. I had put in nephrology referrals various times in the past. Pt was not scheduled. She state nephrologist called her 6 months ago but she did not schedule? One of many times referred she could not go do to pandemic. He gfr has decrease and cr increase. See most recent levels. Will you you call [facility] and see if they will see her quickly if possible. Let me know what they say.

## 2023-08-14 NOTE — Telephone Encounter (Signed)
This is the pt I was calling you about today. Sent you her note. Pt has diabetes and htn. Recent micoalbumin  in past year. On review have referred to nephrologist 5 times. She has not gone since pt of mine  and I think she saw one years ago. Though not sure. She is on hydrochlorothiazide, losartan, amlodipine and metroprol. Weeks ago she did not take her meds and her bp was up close to 180 systolic range. Recently had hoped she was dehydrated but on repeat cr increased over 2 and gfr now 16. On review tonight looks like podiatrist may have rx'd meloxicam. Not sure if she has been using that. I put in nephrology referral  again today(Urinating plenty per her report). Pt is fatigued but mutliple other potential cause. I am thinking of stopping hydrochlorothiazide and losartan for short term until can see nephrology, advising stop meloxicam if using. I was considering hydralazine. Phil in our pharmacy thinks most time helpful but rare case with severe kidney dysfunction may worsen. I asked about clonidine and he had no negative regarding kidney function but I know clonidine has rebound htn to consider. Wanted your opinion. Thanks

## 2023-08-14 NOTE — Addendum Note (Signed)
Addended by: Thelma Barge D on: 08/14/2023 10:29 AM   Modules accepted: Orders

## 2023-08-15 ENCOUNTER — Other Ambulatory Visit (HOSPITAL_BASED_OUTPATIENT_CLINIC_OR_DEPARTMENT_OTHER): Payer: Self-pay

## 2023-08-15 MED ORDER — HYDRALAZINE HCL 25 MG PO TABS
25.0000 mg | ORAL_TABLET | Freq: Three times a day (TID) | ORAL | 0 refills | Status: DC
  Filled 2023-08-15: qty 90, 30d supply, fill #0

## 2023-08-15 NOTE — Addendum Note (Signed)
Addended by: Gwenevere Abbot on: 08/15/2023 12:24 PM   Modules accepted: Orders

## 2023-08-15 NOTE — Telephone Encounter (Signed)
I did talk with Dr. Abner Greenspan today and she does agree that the patient stop losartan and HCTZ will call in hydralazine 25 mg to use 3 times daily.  She will continue amlodipine and Toprol.   I did call patient later and talk to her by phone.  On review patient does confirm that she was using meloxicam daily that the podiatrist had prescribed her.  Advised her to stop meloxicam as well.  Advised patient to pick up hydralazine within our from the [facility] pharmacy and take first dose thereafter to take 1 tablet every 8 hours/3 times daily.  Advised patient to hydrate well.  Asked her to call and get scheduled for repeat metabolic panel in 1 week.  Also sent message to my medical assistant asking her to get patient scheduled as well.  We discussed patient's prior nephrology visits and she states that she has seen nephrologist on the street years ago.  I am going to send another message to referral coordinator and inform their office that she was a former patient.  Also on review did see patient had appointment with [facility] and 2022.  At the end of that note it stated follow-up in 6 months.  On discussion with her she was not happy with the service of the office and when I offered to try to get her in with our office she declined that referral.  I also will concentrate on trying to get expedited appointment with [facility].  Asked patient to check her blood pressure tonight and tomorrow morning and update me.

## 2023-08-16 ENCOUNTER — Other Ambulatory Visit: Payer: Self-pay

## 2023-08-16 ENCOUNTER — Telehealth: Payer: Self-pay

## 2023-08-16 ENCOUNTER — Encounter (HOSPITAL_BASED_OUTPATIENT_CLINIC_OR_DEPARTMENT_OTHER): Payer: Self-pay

## 2023-08-16 ENCOUNTER — Emergency Department (HOSPITAL_BASED_OUTPATIENT_CLINIC_OR_DEPARTMENT_OTHER)
Admission: EM | Admit: 2023-08-16 | Discharge: 2023-08-16 | Disposition: A | Discharge status: Home / Self Care | Payer: Medicare Other | Attending: Emergency Medicine | Admitting: Emergency Medicine

## 2023-08-16 ENCOUNTER — Emergency Department (HOSPITAL_BASED_OUTPATIENT_CLINIC_OR_DEPARTMENT_OTHER): Payer: Medicare Other

## 2023-08-16 ENCOUNTER — Ambulatory Visit: Payer: Self-pay | Admitting: Medical

## 2023-08-16 DIAGNOSIS — I10 Essential (primary) hypertension: Secondary | ICD-10-CM | POA: Diagnosis not present

## 2023-08-16 DIAGNOSIS — N3000 Acute cystitis without hematuria: Secondary | ICD-10-CM | POA: Diagnosis not present

## 2023-08-16 DIAGNOSIS — R42 Dizziness and giddiness: Secondary | ICD-10-CM | POA: Diagnosis present

## 2023-08-16 DIAGNOSIS — E119 Type 2 diabetes mellitus without complications: Secondary | ICD-10-CM | POA: Diagnosis not present

## 2023-08-16 DIAGNOSIS — Z79899 Other long term (current) drug therapy: Secondary | ICD-10-CM | POA: Diagnosis not present

## 2023-08-16 DIAGNOSIS — Z7984 Long term (current) use of oral hypoglycemic drugs: Secondary | ICD-10-CM | POA: Diagnosis not present

## 2023-08-16 LAB — BASIC METABOLIC PANEL
Anion gap: 7 (ref 5–15)
BUN: 30 mg/dL — ABNORMAL HIGH (ref 8–23)
CO2: 26 mmol/L (ref 22–32)
Calcium: 8.1 mg/dL — ABNORMAL LOW (ref 8.9–10.3)
Chloride: 107 mmol/L (ref 98–111)
Creatinine, Ser: 2.47 mg/dL — ABNORMAL HIGH (ref 0.44–1.00)
GFR, Estimated: 20 mL/min — ABNORMAL LOW (ref >60)
Glucose, Bld: 98 mg/dL (ref 70–99)
Potassium: 3.3 mmol/L — ABNORMAL LOW (ref 3.5–5.1)
Sodium: 140 mmol/L (ref 135–145)

## 2023-08-16 LAB — URINALYSIS, W/ REFLEX TO CULTURE (INFECTION SUSPECTED)
Bilirubin Urine: NEGATIVE
Glucose, UA: NEGATIVE mg/dL
Hgb urine dipstick: NEGATIVE
Ketones, ur: NEGATIVE mg/dL
Nitrite: NEGATIVE
Protein, ur: >=300 mg/dL — AB
Specific Gravity, Urine: 1.025 (ref 1.005–1.030)
pH: 6.5 (ref 5.0–8.0)

## 2023-08-16 LAB — RESP PANEL BY RT-PCR (RSV, FLU A&B, COVID)  RVPGX2
Influenza A by PCR: NEGATIVE
Influenza B by PCR: NEGATIVE
Resp Syncytial Virus by PCR: NEGATIVE
SARS Coronavirus 2 by RT PCR: NEGATIVE

## 2023-08-16 LAB — CBC
HCT: 27.9 % — ABNORMAL LOW (ref 36.0–46.0)
Hemoglobin: 9.3 g/dL — ABNORMAL LOW (ref 12.0–15.0)
MCH: 27.3 pg (ref 26.0–34.0)
MCHC: 33.3 g/dL (ref 30.0–36.0)
MCV: 81.8 fL (ref 80.0–100.0)
Platelets: 265 10*3/uL (ref 150–400)
RBC: 3.41 MIL/uL — ABNORMAL LOW (ref 3.87–5.11)
RDW: 13.2 % (ref 11.5–15.5)
WBC: 2.9 10*3/uL — ABNORMAL LOW (ref 4.0–10.5)
nRBC: 0 % (ref 0.0–0.2)

## 2023-08-16 LAB — TROPONIN I (HIGH SENSITIVITY)
Troponin I (High Sensitivity): 10 ng/L (ref <18)
Troponin I (High Sensitivity): 10 ng/L (ref <18)

## 2023-08-16 MED ORDER — CEPHALEXIN 250 MG PO CAPS
500.0000 mg | ORAL_CAPSULE | Freq: Once | ORAL | Status: AC
  Administered 2023-08-16: 500 mg via ORAL
  Filled 2023-08-16: qty 2

## 2023-08-16 MED ORDER — HYDRALAZINE HCL 25 MG PO TABS
25.0000 mg | ORAL_TABLET | Freq: Once | ORAL | Status: AC
  Administered 2023-08-16: 25 mg via ORAL
  Filled 2023-08-16: qty 1

## 2023-08-16 MED ORDER — CEPHALEXIN 500 MG PO CAPS
500.0000 mg | ORAL_CAPSULE | Freq: Two times a day (BID) | ORAL | 0 refills | Status: DC
  Filled 2023-08-16: qty 14, 7d supply, fill #0

## 2023-08-16 NOTE — Telephone Encounter (Signed)
E2C2 called back and I transferred to HG.

## 2023-08-16 NOTE — ED Notes (Signed)
Assumed care of patient at this time. Report received from [place], [place]

## 2023-08-16 NOTE — ED Provider Notes (Addendum)
[facility] Provider Note   CSN: [id number] Arrival date & time: 08/16/23  1520     History  Chief Complaint  Patient presents with   Dizziness    Olivia Morales is a 73 y.o. female.  Patient is a 73 year old female with a history of diabetes, hypertension, hyperlipidemia, sickle cell trait.  She has been having trouble managing her blood pressure over the last couple of weeks.  She says it has been elevated.  Today she noted that it was 200s systolic and that got her very anxious.  She said she has been feeling bad for the last week.  She has had runny nose congestion and coughing.  She was diagnosed with her PCP with bronchitis.  She has been feeling intermittently lightheaded but overall attributes the way she is feeling to her URI symptoms.  She denies any chest pain other than today she was having some palpitations but no other pain.  No shortness of breath other than she is breathing mostly through her mouth because her nose is stopped up.  No fevers.  No vomiting or diarrhea.  She is having some urinary frequency.  She denies any neurologic symptoms such as numbness or weakness to her extremities or speech deficits.  She has been in contact with her doctor about her elevated blood pressure.  On her most recent visit, her creatinine was more elevated than it has been in the past.  She is post to be getting a referral to follow-up with nephrology.  Her blood pressure medicines were changed.  She was taken off the losartan and hydrochlorothiazide and started on hydralazine in addition to her amlodipine and metoprolol.  She just started the hydralazine yesterday.       Home Medications Prior to Admission medications   Medication Sig Start Date End Date Taking? Authorizing Provider  cephALEXin (KEFLEX) 500 MG capsule Take 1 capsule (500 mg total) by mouth 2 (two) times daily. 08/16/23  Yes Rolan Bucco, MD  alendronate (FOSAMAX) 10 MG  tablet TAKE 1 TABLET BY MOUTH DAILY  BEFORE BREAKFAST WITH A FULL  GLASS OF WATER ON AN EMPTY  STOMACH 08/07/23   Saguier, [name], PA-C  amLODipine (NORVASC) 10 MG tablet TAKE 1 TABLET BY MOUTH DAILY 03/22/23   Saguier, [name], PA-C  atorvastatin (LIPITOR) 40 MG tablet TAKE 1 TABLET BY MOUTH DAILY 03/22/23   Saguier, [name], PA-C  benzonatate (TESSALON) 100 MG capsule Take 1 capsule (100 mg total) by mouth 3 (three) times daily as needed for cough. 08/13/23   Saguier, [name], PA-C  COVID-19 mRNA bivalent vaccine, Moderna, (MODERNA COVID-19 BIVAL BOOSTER) 50 MCG/0.5ML injection Inject into the muscle. 07/11/21   Judyann Munson, MD  Diclofenac Sodium (PENNSAID) 2 % SOLN Place 1 application onto the skin 2 (two) times daily. 02/14/21   Myra Rude, MD  dicyclomine (BENTYL) 20 MG tablet Take 1 tablet (20 mg total) by mouth 2 (two) times daily as needed for spasms. 10/18/20   Caccavale, Sophia, PA-C  diphenhydramine-acetaminophen (TYLENOL PM) 25-500 MG TABS Take 2 tablets by mouth every 4 (four) hours as needed (pain).    [provider]  famotidine (PEPCID) 20 MG tablet Take 1 tablet (20 mg total) by mouth daily. 04/15/23   Saguier, [name], PA-C  fluticasone [facility]) 50 MCG/ACT nasal spray USE 2 SPRAYS IN BOTH NOSTRILS  DAILY 03/22/23   Saguier, [name], PA-C  hydrALAZINE (APRESOLINE) 25 MG tablet Take 1 tablet (25 mg total) by mouth 3 (  three) times daily. 08/15/23   Saguier, [name], PA-C  influenza vaccine adjuvanted (FLUAD) 0.5 ML injection Inject into the muscle. 07/11/21   Judyann Munson, MD  Lancets [facility] DELICA PLUS [place]) MISC USE TO TEST 1-2 TIMES DAILY 07/28/18   Saguier, [name], PA-C  magnesium oxide (MAG-OX) 400 (240 Mg) MG tablet Take 1 tablet by mouth once a day 07/10/21     magnesium oxide (MAG-OX) 400 MG tablet Take 1 tablet (400 mg total) by mouth daily. 02/16/21     metoprolol succinate (TOPROL-XL) 50 MG 24 hr tablet TAKE 1 TABLET BY MOUTH DAILY  WITH OR IMMEDIATELY FOLLOWING A   MEAL 03/22/23   Saguier, [name], PA-C  omeprazole (PRILOSEC) 20 MG capsule TAKE 1 CAPSULE BY MOUTH DAILY 03/22/23   Saguier, [name], PA-C  ondansetron (ZOFRAN ODT) 4 MG disintegrating tablet Take 1 tablet (4 mg total) by mouth every 8 (eight) hours as needed for nausea or vomiting. 10/18/20   Caccavale, Sophia, PA-C  ONETOUCH VERIO test strip Check blood sugar 1-2 times daily 05/28/18   Saguier, [name], PA-C  TRULICITY 0.75 MG/0.5ML [facility] INJECT THE CONTENTS OF ONE PEN  SUBCUTANEOUSLY WEEKLY AS  DIRECTED 12/10/22   Saguier, [name], PA-C  Zoster Vaccine Adjuvanted [facility]) injection Inject into the muscle. 02/27/23   Judyann Munson, MD      Allergies    Ace inhibitors    Review of Systems   Review of Systems  Constitutional:  Negative for chills, diaphoresis, fatigue and fever.  HENT:  Positive for congestion and rhinorrhea. Negative for sneezing.   Eyes: Negative.   Respiratory:  Positive for cough. Negative for chest tightness and shortness of breath.   Cardiovascular:  Positive for palpitations. Negative for chest pain and leg swelling.  Gastrointestinal:  Negative for abdominal pain, blood in stool, diarrhea, nausea and vomiting.  Genitourinary:  Positive for frequency and urgency. Negative for difficulty urinating, flank pain and hematuria.  Musculoskeletal:  Negative for arthralgias and back pain.  Skin:  Negative for rash.  Neurological:  Positive for light-headedness. Negative for speech difficulty, weakness, numbness and headaches.    Physical Exam Updated Vital Signs BP (!) 189/77   Pulse 63   Temp 98.4 F (36.9 C) (Oral)   Resp 15   SpO2 100%  Physical Exam Constitutional:      Appearance: She is well-developed.  HENT:     Head: Normocephalic and atraumatic.  Eyes:     Pupils: Pupils are equal, round, and reactive to light.  Cardiovascular:     Rate and Rhythm: Normal rate and regular rhythm.     Heart sounds: Normal heart sounds.  Pulmonary:     Effort: Pulmonary  effort is normal. No respiratory distress.     Breath sounds: Normal breath sounds. No wheezing or rales.  Chest:     Chest wall: No tenderness.  Abdominal:     General: Bowel sounds are normal.     Palpations: Abdomen is soft.     Tenderness: There is no abdominal tenderness. There is no guarding or rebound.  Musculoskeletal:        General: Normal range of motion.     Cervical back: Normal range of motion and neck supple.  Lymphadenopathy:     Cervical: No cervical adenopathy.  Skin:    General: Skin is warm and dry.     Findings: No rash.  Neurological:     General: No focal deficit present.     Mental Status: She is alert and oriented  to person, place, and time.     ED Results / Procedures / Treatments   Labs (all labs ordered are listed, but only abnormal results are displayed) Labs Reviewed  BASIC METABOLIC PANEL - Abnormal; Notable for the following components:      Result Value   Potassium 3.3 (*)    BUN 30 (*)    Creatinine, Ser 2.47 (*)    Calcium 8.1 (*)    GFR, Estimated 20 (*)    All other components within normal limits  CBC - Abnormal; Notable for the following components:   WBC 2.9 (*)    RBC 3.41 (*)    Hemoglobin 9.3 (*)    HCT 27.9 (*)    All other components within normal limits  URINALYSIS, W/ REFLEX TO CULTURE (INFECTION SUSPECTED) - Abnormal; Notable for the following components:   Color, Urine STRAW (*)    Protein, ur >=300 (*)    Leukocytes,Ua SMALL (*)    Bacteria, UA FEW (*)    All other components within normal limits  RESP PANEL BY RT-PCR (RSV, FLU A&B, COVID)  [id number]  TROPONIN I (HIGH SENSITIVITY)  TROPONIN I (HIGH SENSITIVITY)    EKG EKG Interpretation Date/Time:  Friday August 16 2023 15:35:39 EST Ventricular Rate:  64 PR Interval:  102 QRS Duration:  134 QT Interval:  456 QTC Calculation: 471 R Axis:   -41  Text Interpretation: 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 Aortic atherosclerosis. Both lungs are clear. The visualized skeletal structures are unremarkable. IMPRESSION: No active cardiopulmonary disease. Electronically Signed   By: Jasmine Pang M.D.   On: 08/16/2023 18:07    Procedures Procedures    Medications Ordered in ED Medications  cephALEXin (KEFLEX) capsule 500 mg (has no administration in time range)  hydrALAZINE (APRESOLINE) tablet 25 mg (25 mg Oral Given 08/16/23 2009)    ED Course/ Medical Decision Making/ A&P                                 Medical Decision Making Amount and/or Complexity of Data Reviewed Labs: ordered. Radiology: ordered.  Risk Prescription drug management.   Patient is a 73 year old who presents with elevated blood pressures.  She has some fatigue and dizziness but this seems to be more related to her URI that she has had for about the last week.  She does not have any vertiginous type symptoms.  No significant headache.  No symptoms that are more concerning for ACS.  Her EKG does not show any ischemic changes.  She has had 2 negative troponins.  She does not have any neurologic deficits or concerns for stroke.  No other chest pain or concerns for aortic injury.  Her labs show an elevated creatinine but it is actually a little bit lower than her values from yesterday.  She says she has been referred to a nephrologist.  She has a little bit of anemia and leukopenia.  This appears to be more chronic but slightly lower than her baseline values.  This can be followed by her PCP.  Her urine has some concerns for infection although it was not a great specimen  with a lot of squamous cells.  However she is having some urinary urgency and frequency.  Given this we will treat  her with a course of Keflex.  She was given dose of her normal hydralazine here in the ED.  She had a chest x-ray two-view which did not show any acute abnormalities.  No evidence of pneumonia.  This was interpreted by me and confirmed by the radiologist.  She is currently asymptomatic.  She was discharged home in good condition.  She was just recently started on hydralazine and will need to see how this does and controlling her blood pressure.  I advised her to keep a close check on her blood pressures at home and keep in touch with her primary care doctor.  She was discharged home in good condition.  She was encouraged to follow-up with her PCP.  Return precautions were given.  Final Clinical Impression(s) / ED Diagnoses Final diagnoses:  Uncontrolled hypertension  Acute cystitis without hematuria    Rx / DC Orders ED Discharge Orders          Ordered    cephALEXin (KEFLEX) 500 MG capsule  2 times daily        08/16/23 2028              Rolan Bucco, MD 08/16/23 2034    Rolan Bucco, MD 08/16/23 2035

## 2023-08-16 NOTE — Telephone Encounter (Signed)
Copied from CRM [id number]. Topic: Clinical - Medical Advice >> Aug 16, 2023  2:33 PM [place] wrote: Reason for CRM: Patient was calling back to speak to a nurse about blood pressure. I was asking her to hold and she was not answering me. I said her name repeatedly and there was no answer. Tried calling back as well. Please call patient back as soon as possible

## 2023-08-16 NOTE — ED Triage Notes (Signed)
Pt presents with complaints of dizziness, fatigue and just not feeling well. Pt reports that her BP has been elevated, highest being 200's systolic. Also endorses palpitations

## 2023-08-16 NOTE — ED Notes (Signed)
This RN encourage patient to use the restroom to attempt to collect urine sample. Patient states she wants to finish her water first and then will attempt to void.

## 2023-08-16 NOTE — Telephone Encounter (Signed)
Copied from CRM [id number]. Topic: Clinical - Red Word Triage >> Aug 16, 2023  2:36 PM Almira Coaster wrote: Red Word that prompted transfer to Nurse Triage: Patient is calling with a blood pressure level of 200/90 and is feeling light headed.   Chief Complaint: High blood pressure Symptoms: lightheaded, agitates Frequency: constant Pertinent Negatives: Patient denies chest pain Disposition: [] ED /[] Urgent Care (no appt availability in office) / [] Appointment(In office/virtual)/ []  [facility]/ [] Home Care/ [] Refused Recommended Disposition /[]  Mobile Bus/ [x]  Follow-up with PCP Additional Notes: Pt returning call from [place], CMA regarding blood pressure. Pt started on new BP medications and wanted to update office on how she was responding. Pt new BP at 2pm 168/81, pt reports feeling light-headed, breathing heavy, and agitated. Call made to [facility], transferred to [facility]   Reason for Disposition  Systolic BP  >= 160 OR Diastolic >= 100  Answer Assessment - Initial Assessment Questions 1. BLOOD PRESSURE: "What is the blood pressure?" "Did you take at least two measurements 5 minutes apart?"     158/80 then 168/81  2. ONSET: "When did you take your blood pressure?"     At 2pm  3. HOW: "How did you take your blood pressure?" (e.g., automatic home BP monitor, visiting nurse)     Home cuff  4. HISTORY: "Do you have a history of high blood pressure?"     Yes  5. MEDICINES: "Are you taking any medicines for blood pressure?" "Have you missed any doses recently?"     Amlodipine, Hydralazine, and Metoprolol  6. OTHER SYMPTOMS: "Do you have any symptoms?" (e.g., blurred vision, chest pain, difficulty breathing, headache, weakness)     Amlodipine, hydralazine, metoprolol  7. PREGNANCY: "Is there any chance you are pregnant?" "When was your last menstrual period?"     No  Protocols used: Blood Pressure - High-A-AH

## 2023-08-16 NOTE — ED Notes (Signed)
Discharge instructions regarding home care of UTI were reviewed. Pt was instructed to take antibiotics as prescribed, to follow up with PCP, and to return with any new or worsening symptoms. Pt was instructed to take night time blood pressure medications as soon as she gets home. IV removed, catheter intact, and bleeding controlled

## 2023-08-16 NOTE — Telephone Encounter (Signed)
PCP via Teams   pt phasyl. saw her this week. i had asked her to call and give updat on bp this morning. will you call her and get bp reading    Pt called and lvm to return call with bp readings

## 2023-08-16 NOTE — Telephone Encounter (Signed)
Pt called back stated her BP readings were  200/100 7:30 am 190/90  160/90 9:30 am  158/90 12:30 pm  168/81 2:00 pm  Pt stated she picked up her Hydrazyline yesterday   Pt stated she had shakes , dizziness, and some SHOB , spoke with PCP verbally advised pt to go to ED , she stated she would be going to [facility]

## 2023-08-17 ENCOUNTER — Other Ambulatory Visit: Payer: Self-pay

## 2023-08-17 ENCOUNTER — Other Ambulatory Visit (HOSPITAL_BASED_OUTPATIENT_CLINIC_OR_DEPARTMENT_OTHER): Payer: Self-pay

## 2023-08-17 LAB — IRON,TIBC AND FERRITIN PANEL
%SAT: 24 % (ref 16–45)
Ferritin: 522 ng/mL — ABNORMAL HIGH (ref 16–288)
Iron: 61 ug/dL (ref 45–160)
TIBC: 251 ug/dL (ref 250–450)

## 2023-08-17 LAB — VITAMIN B1: Vitamin B1 (Thiamine): 7 nmol/L — ABNORMAL LOW (ref 8–30)

## 2023-08-19 ENCOUNTER — Other Ambulatory Visit (HOSPITAL_BASED_OUTPATIENT_CLINIC_OR_DEPARTMENT_OTHER): Payer: Self-pay

## 2023-08-19 NOTE — Telephone Encounter (Signed)
 I talked with pt today. She is no longer feeling weak or dizzy. Feeling better. She took her bp machine to the ED and verified was getting accurate readings. Today her bp was 188/97 ealier but hour later checked and came down to 168/87. Just before I called her bp was 144/87. So advised continue same plan as gave her late last week. Check bp daily. If her bp goes over 160systolic again go ahead and increase hydralazine to 50 mg three times daily. Pt expressed understanding. Advised pt to call and get scheduled for office visit on Friday at 9:20.  Will you make sure she is scheduled.   Did you get this message last week. Tried to send. She is not scheduled. Please schedule her for tomorrow. In morning.

## 2023-08-22 ENCOUNTER — Telehealth: Payer: Self-pay | Admitting: Medical

## 2023-08-22 ENCOUNTER — Other Ambulatory Visit: Payer: Medicare Other

## 2023-08-22 NOTE — Telephone Encounter (Signed)
 Pt is on lab schedule for tomorrow- are you wanting to see her?

## 2023-08-22 NOTE — Telephone Encounter (Signed)
 Patient scheduled for OV with PCP per PCP request.

## 2023-08-22 NOTE — Telephone Encounter (Signed)
 I talked with pt today. She is no longer feeling weak or dizzy. Feeling better. She took her bp machine to the ED and verified was getting accurate readings. Today her bp was 188/97 ealier but hour later checked and came down to 168/87. Just before I called her bp was 144/87. So advised continue same plan as gave her late last week. Check bp daily. If her bp goes over 160systolic again go ahead and increase hydralazine to 50 mg three times daily. Pt expressed understanding. Advised pt to call and get scheduled for office visit on Friday at 9:20.  Will you make sure she is scheduled.   Did you get this message last week. Tried to send. She is not scheduled. Please schedule her for tomorrow. In morning.

## 2023-08-23 ENCOUNTER — Encounter: Payer: Self-pay | Admitting: Medical

## 2023-08-23 ENCOUNTER — Other Ambulatory Visit: Payer: Medicare Other

## 2023-08-23 ENCOUNTER — Ambulatory Visit: Payer: Medicare Other | Admitting: Medical

## 2023-08-23 VITALS — BP 145/78 | HR 70 | Resp 18 | Ht 62.0 in | Wt 153.8 lb

## 2023-08-23 DIAGNOSIS — M79676 Pain in unspecified toe(s): Secondary | ICD-10-CM

## 2023-08-23 DIAGNOSIS — K861 Other chronic pancreatitis: Secondary | ICD-10-CM

## 2023-08-23 DIAGNOSIS — M1000 Idiopathic gout, unspecified site: Secondary | ICD-10-CM

## 2023-08-23 DIAGNOSIS — R944 Abnormal results of kidney function studies: Secondary | ICD-10-CM

## 2023-08-23 DIAGNOSIS — I10 Essential (primary) hypertension: Secondary | ICD-10-CM | POA: Diagnosis not present

## 2023-08-23 DIAGNOSIS — E1165 Type 2 diabetes mellitus with hyperglycemia: Secondary | ICD-10-CM | POA: Diagnosis not present

## 2023-08-23 DIAGNOSIS — R748 Abnormal levels of other serum enzymes: Secondary | ICD-10-CM

## 2023-08-23 LAB — COMPREHENSIVE METABOLIC PANEL
ALT: 9 U/L (ref 0–35)
AST: 18 U/L (ref 0–37)
Albumin: 3.8 g/dL (ref 3.5–5.2)
Alkaline Phosphatase: 88 U/L (ref 39–117)
BUN: 29 mg/dL — ABNORMAL HIGH (ref 6–23)
CO2: 29 meq/L (ref 19–32)
Calcium: 8.7 mg/dL (ref 8.4–10.5)
Chloride: 105 meq/L (ref 96–112)
Creatinine, Ser: 2.22 mg/dL — ABNORMAL HIGH (ref 0.40–1.20)
GFR: 21.84 mL/min — ABNORMAL LOW (ref >60.00)
Glucose, Bld: 85 mg/dL (ref 70–99)
Potassium: 3.5 meq/L (ref 3.5–5.1)
Sodium: 140 meq/L (ref 135–145)
Total Bilirubin: 0.7 mg/dL (ref 0.2–1.2)
Total Protein: 6.4 g/dL (ref 6.0–8.3)

## 2023-08-23 LAB — LIPASE: Lipase: 68 U/L — ABNORMAL HIGH (ref 11.0–59.0)

## 2023-08-23 LAB — URIC ACID: Uric Acid, Serum: 9.2 mg/dL — ABNORMAL HIGH (ref 2.4–7.0)

## 2023-08-23 NOTE — Progress Notes (Signed)
 Subjective:    Patient ID: Olivia Morales, female    DOB: [birth year], 73 y.o.   MRN: [id number]  HPI Discussed the use of AI scribe software for clinical note transcription with the patient, who gave verbal consent to proceed.  History of Present Illness   Olivia Morales is a 73 year old female with hypertension who presents for blood pressure management and follow up on kidney function.  She has experienced significant improvement in blood pressure control since starting hydralazine. Previously, her blood pressure readings were as high as 200/100 mmHg last week, causing fatigue and lightheadedness. Currently, her readings are consistently in the range of 140-160/80 mmHg. She is taking amlodipine 10 mg daily and hydralazine 25 mg three times a day. She has stopped taking losartan, hydrochlorothiazide, and meloxicam due to recent increase cr and drop in gfr. After med changes bp increased briefly but then bp has decreased and pt feels much better.  Her lipase levels were elevated along with some nausea, leading to the discontinuation of Trulicity, which had been effective in controlling her blood sugar levels. She has not consumed alcohol recently, with her last drink being a Bloody Mary on February 13th, after her blood work was done. She used to drink beer daily but has significantly reduced her alcohol intake.   Note she drank alcohol after lipase results were back. None since.  Her diabetes is well-controlled with a recent A1c of 5.8.  She reports bilateral great toe pain for about a week, describing it as severe and exacerbated by touch and movement. She has a history of plantar fasciitis and has been seen by a podiatrist, who prescribed meloxicam. Her right great toe is more painful than the left, and she describes the pain as 'real bad'.          Review of Systems  Constitutional:  Negative for chills, fatigue and fever.  HENT:  Negative for congestion and ear  discharge.   Respiratory:  Negative for cough, chest tightness, shortness of breath and wheezing.   Cardiovascular:  Negative for chest pain and palpitations.  Gastrointestinal:  Negative for abdominal pain, nausea and vomiting.  Genitourinary:  Negative for dysuria and flank pain.  Musculoskeletal:  Negative for back pain.       Bilateral great toe pain.   Neurological:  Negative for dizziness, seizures, weakness and numbness.  Hematological:  Negative for adenopathy. Does not bruise/bleed easily.  Psychiatric/Behavioral:  Negative for behavioral problems, decreased concentration and dysphoric mood.    Past Medical History:  Diagnosis Date   Diabetes mellitus    Hypercholesteremia    Hypertension    Sickle cell trait (HCC)    Sleep apnea      Social History   Socioeconomic History   Marital status: Widowed    Spouse name: Not on file   Number of children: Not on file   Years of education: Not on file   Highest education level: Not on file  Occupational History   Not on file  Tobacco Use   Smoking status: Never   Smokeless tobacco: Never  Vaping Use   Vaping status: Never Used  Substance and Sexual Activity   Alcohol use: Yes    Comment: weekly   Drug use: No   Sexual activity: Not on file  Other Topics Concern   Not on file  Social History Narrative   Not on file   Social Drivers of Health   Financial Resource Strain: Low Risk  (06/05/2021)  Overall Financial Resource Strain (CARDIA)    Difficulty of Paying Living Expenses: Not hard at all  Food Insecurity: No Food Insecurity (08/23/2022)   Hunger Vital Sign    Worried About Running Out of Food in the Last Year: Never true    Ran Out of Food in the Last Year: Never true  Transportation Needs: No Transportation Needs (08/23/2022)   PRAPARE - Administrator, Civil Service (Medical): No    Lack of Transportation (Non-Medical): No  Physical Activity: Inactive (06/05/2021)   Exercise Vital Sign    Days  of Exercise per Week: 0 days    Minutes of Exercise per Session: 0 min  Stress: No Stress Concern Present (06/05/2021)   [employer] of Occupational Health - Occupational Stress Questionnaire    Feeling of Stress : Not at all  Social Connections: Moderately Integrated (06/05/2021)   Social Connection and Isolation Panel [NHANES]    Frequency of Communication with Friends and Family: More than three times a week    Frequency of Social Gatherings with Friends and Family: More than three times a week    Attends Religious Services: More than 4 times per year    Active Member of [employer] or Organizations: Yes    Attends Banker Meetings: More than 4 times per year    Marital Status: Widowed  Intimate Partner Violence: Not At Risk (08/23/2022)   Humiliation, Afraid, Rape, and Kick questionnaire    Fear of Current or Ex-Partner: No    Emotionally Abused: No    Physically Abused: No    Sexually Abused: No    Past Surgical History:  Procedure Laterality Date   TUBAL LIGATION      Family History  Problem Relation Age of Onset   Sickle cell anemia Father     Allergies  Allergen Reactions   Ace Inhibitors     Cough    Current Outpatient Medications on File Prior to Visit  Medication Sig Dispense Refill   alendronate (FOSAMAX) 10 MG tablet TAKE 1 TABLET BY MOUTH DAILY  BEFORE BREAKFAST WITH A FULL  GLASS OF WATER ON AN EMPTY  STOMACH 100 tablet 2   amLODipine (NORVASC) 10 MG tablet TAKE 1 TABLET BY MOUTH DAILY 100 tablet 2   atorvastatin (LIPITOR) 40 MG tablet TAKE 1 TABLET BY MOUTH DAILY 100 tablet 2   benzonatate (TESSALON) 100 MG capsule Take 1 capsule (100 mg total) by mouth 3 (three) times daily as needed for cough. 30 capsule 0   cephALEXin (KEFLEX) 500 MG capsule Take 1 capsule (500 mg total) by mouth 2 (two) times daily. 14 capsule 0   COVID-19 mRNA bivalent vaccine, Moderna, (MODERNA COVID-19 BIVAL BOOSTER) 50 MCG/0.5ML injection Inject into the muscle. 0.5 mL 0    Diclofenac Sodium (PENNSAID) 2 % SOLN Place 1 application onto the skin 2 (two) times daily. 112 g 2   dicyclomine (BENTYL) 20 MG tablet Take 1 tablet (20 mg total) by mouth 2 (two) times daily as needed for spasms. 10 tablet 0   diphenhydramine-acetaminophen (TYLENOL PM) 25-500 MG TABS Take 2 tablets by mouth every 4 (four) hours as needed (pain).     famotidine (PEPCID) 20 MG tablet Take 1 tablet (20 mg total) by mouth daily. 30 tablet 0   fluticasone (FLONASE) 50 MCG/ACT nasal spray USE 2 SPRAYS IN BOTH NOSTRILS  DAILY 32 g 0   hydrALAZINE (APRESOLINE) 25 MG tablet Take 1 tablet (25 mg total) by mouth 3 (three) times  daily. 90 tablet 0   influenza vaccine adjuvanted (FLUAD) 0.5 ML injection Inject into the muscle. 0.5 mL 0   Lancets (ONETOUCH DELICA PLUS LANCET33G) MISC USE TO TEST 1-2 TIMES DAILY 100 each 0   magnesium oxide (MAG-OX) 400 (240 Mg) MG tablet Take 1 tablet by mouth once a day 120 tablet 5   magnesium oxide (MAG-OX) 400 MG tablet Take 1 tablet (400 mg total) by mouth daily. 30 tablet 5   metoprolol succinate (TOPROL-XL) 50 MG 24 hr tablet TAKE 1 TABLET BY MOUTH DAILY  WITH OR IMMEDIATELY FOLLOWING A  MEAL 100 tablet 2   omeprazole (PRILOSEC) 20 MG capsule TAKE 1 CAPSULE BY MOUTH DAILY 100 capsule 2   ondansetron (ZOFRAN ODT) 4 MG disintegrating tablet Take 1 tablet (4 mg total) by mouth every 8 (eight) hours as needed for nausea or vomiting. 15 tablet 0   ONETOUCH VERIO test strip Check blood sugar 1-2 times daily 100 each 12   TRULICITY 0.75 MG/0.5ML SOPN INJECT THE CONTENTS OF ONE PEN  SUBCUTANEOUSLY WEEKLY AS  DIRECTED 6 mL 3   Zoster Vaccine Adjuvanted [facility]) injection Inject into the muscle. 0.5 mL 1   No current facility-administered medications on file prior to visit.    BP (!) 145/78   Pulse 70   Resp 18   Ht 5\' 2"  (1.575 m)   Wt 153 lb 12.8 oz (69.8 kg)   SpO2 99%   BMI 28.13 kg/m        Objective:   Physical Exam  General Mental Status- Alert.  General Appearance- Not in acute distress.   Skin General: Color- Normal Color. Moisture- Normal Moisture.  Neck Carotid Arteries- Normal color. Moisture- Normal Moisture. No carotid bruits. No JVD.  Chest and Lung Exam Auscultation: Breath Sounds:-Normal.  Cardiovascular Auscultation:Rythm- Regular. Murmurs & Other Heart Sounds:Auscultation of the heart reveals- No Murmurs.  Abdomen Inspection:-Inspeection Normal. Palpation/Percussion:Note:No mass. Palpation and Percussion of the abdomen reveal- Non Tender, Non Distended + BS, no rebound or guarding.    Neurologic Cranial Nerve exam:- CN III-XII intact(No nystagmus), symmetric smile. Drift Test:- No drift. Romberg Exam:- Negative.  Heal to Toe Gait exam:-Normal. Finger to Nose:- Normal/Intact Strength:- 5/5 equal and symmetric strength both upper and lower extremities.    Bilateral foot- both toes have pain on palpation but more on rt side at base. No redness or swelling. Some hallux valgus. Worse on rt side.     Assessment & Plan:  Assessment and Plan    Hypertension improved recently and overall feeling better. Improved control with Amlodipine 10mg  daily and Hydralazine 25mg  three times a day. Discussed increasing Hydralazine to 50mg  three times a day to further improve control. -Increase Hydralazine to 50mg  three times a day. -Check blood pressure daily and report readings at next contact.  Foot Pain/great toe pains. Bilateral foot pain, worse on the right, with some deviation of the toes (hallux valgus). Painful to touch. No recent imaging. -Order x-ray of the right foot. Left side will wait on since pain minimal on that side. -Consider referral back to podiatrist if pain persists. -uric acid lab  Renal Function Recent increase in creatinine and decrease in GFR. Stopped Losartan, Hydrochlorothiazide, and Meloxicam due to potential renal effects.(see prior phone notes) -Repeat renal function tests today. -Check  on status of referral to Nephrology.  Diabetes Well controlled with Trulicity, but recently stopped due to elevated lipase. No recent alcohol use. -Repeat lipase today. -Consider restarting Trulicity if lipase normalizes, with repeat lipase  in a few weeks.  Colonoscopy Outstanding referral for colonoscopy. -Check on status of referral to [facility] for colonoscopy.  Follow-up To be determined based on lab review. Patient to report blood pressure readings at next contact.

## 2023-08-23 NOTE — Patient Instructions (Signed)
 Hypertension improved recently and overall feeling better. Improved control with Amlodipine 10mg  daily and Hydralazine 25mg  three times a day. Discussed increasing Hydralazine to 50mg  three times a day to further improve control. -Increase Hydralazine to 50mg  three times a day. -Check blood pressure daily and report readings at next contact.  Foot Pain/great toe pains. Bilateral foot pain, worse on the right, with some deviation of the toes (hallux valgus). Painful to touch. No recent imaging. -Order x-ray of the right foot. Left side will wait on since pain minimal on that side. -Consider referral back to podiatrist if pain persists. -uric acid lab  Renal Function Recent increase in creatinine and decrease in GFR. Stopped Losartan, Hydrochlorothiazide, and Meloxicam due to potential renal effects.(see prior phone notes) -Repeat renal function tests today. -Check on status of referral to Nephrology.  Diabetes Well controlled with Trulicity, but recently stopped due to elevated lipase. No recent alcohol use. -Repeat lipase today. -Consider restarting Trulicity if lipase normalizes, with repeat lipase in a few weeks.  Colonoscopy Outstanding referral for colonoscopy. -Check on status of referral to [facility] for colonoscopy.  Follow-up To be determined based on lab review. Patient to report blood pressure readings at next contact.

## 2023-08-24 ENCOUNTER — Encounter: Payer: Self-pay | Admitting: Medical

## 2023-08-24 NOTE — Addendum Note (Signed)
 Addended by: Gwenevere Abbot on: 08/24/2023 08:00 AM   Modules accepted: Orders

## 2023-08-24 NOTE — Addendum Note (Signed)
 Addended by: Gwenevere Abbot on: 08/24/2023 07:55 AM   Modules accepted: Orders

## 2023-08-24 NOTE — Addendum Note (Signed)
 Addended by: Gwenevere Abbot on: 08/24/2023 07:58 AM   Modules accepted: Orders

## 2023-08-27 NOTE — Addendum Note (Signed)
 Addended by: Gwenevere Abbot on: 08/27/2023 07:07 AM   Modules accepted: Orders

## 2023-08-29 ENCOUNTER — Ambulatory Visit: Payer: Self-pay | Admitting: Medical

## 2023-08-29 ENCOUNTER — Ambulatory Visit (INDEPENDENT_AMBULATORY_CARE_PROVIDER_SITE_OTHER): Payer: Medicare Other

## 2023-08-29 ENCOUNTER — Telehealth: Payer: Self-pay | Admitting: Medical

## 2023-08-29 ENCOUNTER — Ambulatory Visit (HOSPITAL_BASED_OUTPATIENT_CLINIC_OR_DEPARTMENT_OTHER)
Admission: RE | Admit: 2023-08-29 | Discharge: 2023-08-29 | Disposition: A | Discharge status: Home / Self Care | Payer: Medicare Other | Source: Ambulatory Visit | Attending: Medical | Admitting: Medical

## 2023-08-29 ENCOUNTER — Other Ambulatory Visit (INDEPENDENT_AMBULATORY_CARE_PROVIDER_SITE_OTHER): Payer: Medicare Other

## 2023-08-29 ENCOUNTER — Telehealth: Payer: Self-pay | Admitting: Emergency Medicine

## 2023-08-29 ENCOUNTER — Encounter: Payer: Self-pay | Admitting: Medical

## 2023-08-29 ENCOUNTER — Encounter (HOSPITAL_BASED_OUTPATIENT_CLINIC_OR_DEPARTMENT_OTHER): Payer: Self-pay

## 2023-08-29 DIAGNOSIS — Z1231 Encounter for screening mammogram for malignant neoplasm of breast: Secondary | ICD-10-CM | POA: Diagnosis not present

## 2023-08-29 DIAGNOSIS — E1165 Type 2 diabetes mellitus with hyperglycemia: Secondary | ICD-10-CM

## 2023-08-29 DIAGNOSIS — M79676 Pain in unspecified toe(s): Secondary | ICD-10-CM | POA: Diagnosis present

## 2023-08-29 DIAGNOSIS — Z139 Encounter for screening, unspecified: Secondary | ICD-10-CM

## 2023-08-29 DIAGNOSIS — I10 Essential (primary) hypertension: Secondary | ICD-10-CM | POA: Diagnosis not present

## 2023-08-29 LAB — COMPREHENSIVE METABOLIC PANEL
ALT: 9 U/L (ref 0–35)
AST: 16 U/L (ref 0–37)
Albumin: 3.9 g/dL (ref 3.5–5.2)
Alkaline Phosphatase: 98 U/L (ref 39–117)
BUN: 25 mg/dL — ABNORMAL HIGH (ref 6–23)
CO2: 27 meq/L (ref 19–32)
Calcium: 9.5 mg/dL (ref 8.4–10.5)
Chloride: 109 meq/L (ref 96–112)
Creatinine, Ser: 1.83 mg/dL — ABNORMAL HIGH (ref 0.40–1.20)
GFR: 27.54 mL/min — ABNORMAL LOW (ref >60.00)
Glucose, Bld: 89 mg/dL (ref 70–99)
Potassium: 3.8 meq/L (ref 3.5–5.1)
Sodium: 142 meq/L (ref 135–145)
Total Bilirubin: 0.6 mg/dL (ref 0.2–1.2)
Total Protein: 6.5 g/dL (ref 6.0–8.3)

## 2023-08-29 LAB — POCT CBG (FASTING - GLUCOSE)-MANUAL ENTRY: Glucose Fasting, POC: 88 mg/dL (ref 70–99)

## 2023-08-29 NOTE — Telephone Encounter (Signed)
 Patient declined triage. Patient stated that she was seen in the office this morning for a BP check with Nurse Dahlia Client. Patient is calling to report the BP readings that she was instructed to take at home. This RN attempted to call the CAL to give report twice, but no answer.  Copied from CRM [id number]. Topic: Clinical - Red Word Triage >> Aug 29, 2023  3:07 PM Adele Barthel wrote: Red Word that prompted transfer to Nurse Triage:   Patient called into office to report blood pressure readings to [facility], Dr. Milagros Reap nurse. Advised could take readings and she reports:  02/27 2:00 PM- 196/90 2:30 PM- 186/74 3:00 PM- 174/67  Patient refused triage when offered and wanted message sent to office.   CB# [phone]

## 2023-08-29 NOTE — Progress Notes (Signed)
 Blood pressure 162/74 - pt didn't take medication today , told her go home and take BP medication and give [place] an up to date reading   CBG- 88

## 2023-08-29 NOTE — Telephone Encounter (Signed)
 Copied from CRM [id number]. Topic: General - Other >> Aug 29, 2023  3:02 PM Adele Barthel wrote: Reason for CRM:   Patient is requesting a new (ONE TOUCH ULTRA SYSTEM KIT) W/DEVICE KIT, she has lost her previous monitor  CB# [phone]

## 2023-08-29 NOTE — Telephone Encounter (Signed)
 Copied from CRM [id number]. Topic: General - Other >> Aug 29, 2023  3:20 PM Adele Barthel wrote: Reason for CRM:   Patient called into office to report blood pressure readings to [facility], Dr. Milagros Reap nurse. Advised could take readings and she reports:  02/27  2:00 PM- 196/90  2:30 PM- 186/74  3:00 PM- 174/67  Patient refused triage when offered and wanted message sent to office.  CB# [phone]

## 2023-08-30 ENCOUNTER — Other Ambulatory Visit (HOSPITAL_BASED_OUTPATIENT_CLINIC_OR_DEPARTMENT_OTHER): Payer: Self-pay

## 2023-08-30 MED ORDER — LANCET DEVICE MISC
1.0000 | Freq: Three times a day (TID) | 0 refills | Status: AC
Start: 2023-08-30 — End: 2023-09-29
  Filled 2023-08-30: qty 1, 30d supply, fill #0

## 2023-08-30 MED ORDER — BLOOD GLUCOSE MONITOR SYSTEM W/DEVICE KIT
1.0000 | PACK | Freq: Three times a day (TID) | 0 refills | Status: DC
  Filled 2023-08-30: qty 1, 90d supply, fill #0

## 2023-08-30 MED ORDER — BLOOD GLUCOSE TEST VI STRP
1.0000 | ORAL_STRIP | Freq: Three times a day (TID) | 0 refills | Status: DC
  Filled 2023-08-30: qty 100, 34d supply, fill #0

## 2023-08-30 MED ORDER — ACCU-CHEK SOFTCLIX LANCETS MISC
1.0000 | Freq: Three times a day (TID) | 0 refills | Status: AC
  Filled 2023-08-30: qty 100, 33d supply, fill #0

## 2023-08-30 NOTE — Telephone Encounter (Signed)
 Pt called and lvm to return call

## 2023-08-30 NOTE — Telephone Encounter (Signed)
 Diabetic supplies sent to [facility]

## 2023-08-30 NOTE — Addendum Note (Signed)
 Addended by: Maximino Sarin on: 08/30/2023 08:05 AM   Modules accepted: Orders

## 2023-09-02 ENCOUNTER — Encounter: Payer: Self-pay | Admitting: Medical

## 2023-09-02 ENCOUNTER — Telehealth: Payer: Self-pay | Admitting: Medical

## 2023-09-02 ENCOUNTER — Other Ambulatory Visit (HOSPITAL_BASED_OUTPATIENT_CLINIC_OR_DEPARTMENT_OTHER): Payer: Self-pay

## 2023-09-02 NOTE — Telephone Encounter (Signed)
 Pt called and lvm to return call

## 2023-09-02 NOTE — Telephone Encounter (Signed)
 Pt called and scheduled for a visit per PCP request

## 2023-09-02 NOTE — Telephone Encounter (Signed)
 Pt called and pt has been scheduled for an appointment 09/03/23

## 2023-09-02 NOTE — Telephone Encounter (Signed)
 Copied from CRM [id number]. Topic: General - Other >> Sep 02, 2023 12:15 PM Gurney Maxin H wrote: Reason for CRM: Patient returning call to clinic and also states she has elevated blood pressure, feet swollen and severely fatigue. Patient refused triage and only wants to speak with office. Please reach back out to patient. Patient refused to be triaged and states she only wants to speak with the office.

## 2023-09-03 ENCOUNTER — Ambulatory Visit (HOSPITAL_BASED_OUTPATIENT_CLINIC_OR_DEPARTMENT_OTHER)
Admission: RE | Admit: 2023-09-03 | Discharge: 2023-09-03 | Disposition: A | Discharge status: Home / Self Care | Source: Ambulatory Visit | Attending: Medical | Admitting: Medical

## 2023-09-03 ENCOUNTER — Other Ambulatory Visit (HOSPITAL_BASED_OUTPATIENT_CLINIC_OR_DEPARTMENT_OTHER): Payer: Self-pay

## 2023-09-03 ENCOUNTER — Ambulatory Visit: Admitting: Medical

## 2023-09-03 ENCOUNTER — Encounter: Payer: Self-pay | Admitting: Medical

## 2023-09-03 VITALS — BP 160/78 | HR 60 | Temp 98.0°F | Resp 18 | Ht 62.0 in | Wt 156.4 lb

## 2023-09-03 DIAGNOSIS — R5383 Other fatigue: Secondary | ICD-10-CM

## 2023-09-03 DIAGNOSIS — E1165 Type 2 diabetes mellitus with hyperglycemia: Secondary | ICD-10-CM

## 2023-09-03 DIAGNOSIS — I10 Essential (primary) hypertension: Secondary | ICD-10-CM

## 2023-09-03 DIAGNOSIS — R600 Localized edema: Secondary | ICD-10-CM | POA: Insufficient documentation

## 2023-09-03 DIAGNOSIS — R748 Abnormal levels of other serum enzymes: Secondary | ICD-10-CM

## 2023-09-03 DIAGNOSIS — E538 Deficiency of other specified B group vitamins: Secondary | ICD-10-CM

## 2023-09-03 DIAGNOSIS — R7989 Other specified abnormal findings of blood chemistry: Secondary | ICD-10-CM

## 2023-09-03 DIAGNOSIS — M79672 Pain in left foot: Secondary | ICD-10-CM

## 2023-09-03 DIAGNOSIS — M7662 Achilles tendinitis, left leg: Secondary | ICD-10-CM

## 2023-09-03 LAB — COMPREHENSIVE METABOLIC PANEL
ALT: 10 U/L (ref 0–35)
AST: 16 U/L (ref 0–37)
Albumin: 3.8 g/dL (ref 3.5–5.2)
Alkaline Phosphatase: 104 U/L (ref 39–117)
BUN: 14 mg/dL (ref 6–23)
CO2: 28 meq/L (ref 19–32)
Calcium: 9.7 mg/dL (ref 8.4–10.5)
Chloride: 109 meq/L (ref 96–112)
Creatinine, Ser: 1.52 mg/dL — ABNORMAL HIGH (ref 0.40–1.20)
GFR: 34.4 mL/min — ABNORMAL LOW (ref >60.00)
Glucose, Bld: 100 mg/dL — ABNORMAL HIGH (ref 70–99)
Potassium: 3.7 meq/L (ref 3.5–5.1)
Sodium: 142 meq/L (ref 135–145)
Total Bilirubin: 0.6 mg/dL (ref 0.2–1.2)
Total Protein: 6.4 g/dL (ref 6.0–8.3)

## 2023-09-03 LAB — BRAIN NATRIURETIC PEPTIDE: Pro B Natriuretic peptide (BNP): 417 pg/mL — ABNORMAL HIGH (ref 0.0–100.0)

## 2023-09-03 LAB — LIPASE: Lipase: 45 U/L (ref 11.0–59.0)

## 2023-09-03 MED ORDER — OMRON 3 SERIES BP MONITOR DEVI
0 refills | Status: DC
  Filled 2023-09-03: qty 1, 30d supply, fill #0

## 2023-09-03 MED ORDER — VITAMIN D (ERGOCALCIFEROL) 1.25 MG (50000 UNIT) PO CAPS
50000.0000 [IU] | ORAL_CAPSULE | ORAL | 0 refills | Status: DC
  Filled 2023-09-03: qty 8, 56d supply, fill #0

## 2023-09-03 NOTE — Patient Instructions (Addendum)
 Hypertension Blood pressure remains uncontrolled on current hydralazine dosage. - Increase hydralazine to 75 mg three times daily. - Continue amlodipine 10 mg daily. - Advise daily blood pressure monitoring with a new machine. - Instruct to send blood pressure readings via MyChart.  Peripheral Edema(probable dependant/gravity edema) Intermittent bilateral leg swelling suggests possible CHF; DVT not likley, calves symmetric. negative homans signs and no dyspnea. [place] lower ext not indicated. - Order chest x-ray to assess heart size and fluid accumulation. - Order BNP test to evaluate for heart failure. - Order metabolic panel. - Consider cardiology referral based on results.  Fatigue Persistent fatigue likely due to low vitamin B12 and D levels. - Administer vitamin B12 injections weekly. - Prescribe vitamin D 50,000 IU weekly for 8 weeks. - Advise 5000 mcg oral vitamin B12 daily until injections are received.  Hyperuricemia Elevated uric acid level. - Refer to rheumatologist for evaluation and potential Uloric treatment.  Diabetic Shoe Coordination Need for updated diabetic shoe. - Staff tp assist in obtaining forms for diabetic shoes. - Coordinate with podiatrist for form completion.  Diabetes- temporarily holding off on meds since lipase elevated and decreased kidney function. Check sugars 3 times daily. update me on readings. May need to refer to endocrinoloigst.   Follow up date to be determined after lab review.   Follow-up and Coordination Multiple referrals and follow-up plans required for comprehensive care. - Follow up with nephrologist. - Follow up with GI specialist. - Follow up with rheumatologist. - Schedule follow-up appointment based on lab and imaging results. - CT abdomen pelvis without contrast.

## 2023-09-03 NOTE — Telephone Encounter (Signed)
 Pt stats she needs new diabetic shoes. She last got shoes 4-5 years ago. Can you get form for me to evaluate.

## 2023-09-03 NOTE — Progress Notes (Signed)
 Subjective:    Patient ID: Lucio Melisse Caetano, female    DOB: [birth year], 73 y.o.   MRN: [id number]  HPI Discussed the use of AI scribe software for clinical note transcription with the patient, who gave verbal consent to proceed.  History of Present Illness   Caron Williams Che is a 73 year old female with hypertension who presents with fluctuating blood pressure and bilateral leg swelling.  She experiences fluctuating blood pressure readings, with some days showing readings in the 140-150 mmHg range and other days showing higher readings, including a systolic of 170 mmHg. She continues to use her old blood pressure machine. She is currently taking hydralazine 50 mg three times a day, which was increased from 25 mg three times a day, and amlodipine 10 mg daily. Despite the increase in hydralazine, her blood pressure remains elevated with recent readings of 196/90 mmHg, 186/90 mmHg, and 174/76 mmHg.(pt checking her bp with machine that may be 71 years old)  She experiences bilateral leg swelling, which is more pronounced at the end of the day. She notes difficulty finding shoes that fit due to the swelling and mentions that her feet and toes are painful, particularly at the bases of the toes. The swelling was significant enough to prevent her from attending a foot doctor appointment due to pain while walking. She elevates her feet at night to alleviate the swelling. No pain in the back of her knees.  She reports significant fatigue, stating that she often needs to lie down during the day. She has been prescribed vitamin D and vitamin B12 supplements, as her vitamin D levels were low and she was previously scheduled for B12 injections, which she has not yet received. She takes over-the-counter vitamin B12 tablets at home.  Her family history includes her daughter, who had a double bypass surgery a year ago at the age of 85. Her daughter was a smoker, but she has never smoked herself. Pt does  not report dyspnea or chest pain.         Review of Systems  Constitutional:  Positive for fatigue.  HENT:  Negative for congestion.   Respiratory:  Negative for cough, chest tightness, shortness of breath and wheezing.   Cardiovascular:  Negative for chest pain and palpitations.  Gastrointestinal:  Negative for abdominal pain and nausea.  Genitourinary:  Negative for dysuria and frequency.  Musculoskeletal:  Negative for back pain and neck pain.  Skin:  Negative for rash.  Neurological:  Negative for facial asymmetry and light-headedness.  Hematological:  Negative for adenopathy. Does not bruise/bleed easily.  Psychiatric/Behavioral:  Negative for behavioral problems and confusion.     Past Medical History:  Diagnosis Date   Diabetes mellitus    Hypercholesteremia    Hypertension    Sickle cell trait (HCC)    Sleep apnea      Social History   Socioeconomic History   Marital status: Widowed    Spouse name: Not on file   Number of children: Not on file   Years of education: Not on file   Highest education level: Not on file  Occupational History   Not on file  Tobacco Use   Smoking status: Never   Smokeless tobacco: Never  Vaping Use   Vaping status: Never Used  Substance and Sexual Activity   Alcohol use: Yes    Comment: weekly   Drug use: No   Sexual activity: Not on file  Other Topics Concern   Not on file  Social  History Narrative   Not on file   Social Drivers of Health   Financial Resource Strain: Low Risk  (06/05/2021)   Overall Financial Resource Strain (CARDIA)    Difficulty of Paying Living Expenses: Not hard at all  Food Insecurity: No Food Insecurity (08/23/2022)   Hunger Vital Sign    Worried About Running Out of Food in the Last Year: Never true    Ran Out of Food in the Last Year: Never true  Transportation Needs: No Transportation Needs (08/23/2022)   PRAPARE - Administrator, Civil Service (Medical): No    Lack of Transportation  (Non-Medical): No  Physical Activity: Inactive (06/05/2021)   Exercise Vital Sign    Days of Exercise per Week: 0 days    Minutes of Exercise per Session: 0 min  Stress: No Stress Concern Present (06/05/2021)   [employer] of Occupational Health - Occupational Stress Questionnaire    Feeling of Stress : Not at all  Social Connections: Moderately Integrated (06/05/2021)   Social Connection and Isolation Panel [NHANES]    Frequency of Communication with Friends and Family: More than three times a week    Frequency of Social Gatherings with Friends and Family: More than three times a week    Attends Religious Services: More than 4 times per year    Active Member of [employer] or Organizations: Yes    Attends Banker Meetings: More than 4 times per year    Marital Status: Widowed  Intimate Partner Violence: Not At Risk (08/23/2022)   Humiliation, Afraid, Rape, and Kick questionnaire    Fear of Current or Ex-Partner: No    Emotionally Abused: No    Physically Abused: No    Sexually Abused: No    Past Surgical History:  Procedure Laterality Date   TUBAL LIGATION      Family History  Problem Relation Age of Onset   Sickle cell anemia Father     Allergies  Allergen Reactions   Ace Inhibitors     Cough    Current Outpatient Medications on File Prior to Visit  Medication Sig Dispense Refill   Accu-Chek Softclix Lancets lancets Use 1 each in the morning, at noon, and at bedtime. 100 each 0   alendronate (FOSAMAX) 10 MG tablet TAKE 1 TABLET BY MOUTH DAILY  BEFORE BREAKFAST WITH A FULL  GLASS OF WATER ON AN EMPTY  STOMACH 100 tablet 2   amLODipine (NORVASC) 10 MG tablet TAKE 1 TABLET BY MOUTH DAILY 100 tablet 2   atorvastatin (LIPITOR) 40 MG tablet TAKE 1 TABLET BY MOUTH DAILY 100 tablet 2   benzonatate (TESSALON) 100 MG capsule Take 1 capsule (100 mg total) by mouth 3 (three) times daily as needed for cough. 30 capsule 0   Blood Glucose Monitoring Suppl (BLOOD GLUCOSE  MONITOR SYSTEM) w/Device KIT Use as directed in the morning, at noon, and at bedtime. 1 kit 0   cephALEXin (KEFLEX) 500 MG capsule Take 1 capsule (500 mg total) by mouth 2 (two) times daily. 14 capsule 0   COVID-19 mRNA bivalent vaccine, Moderna, (MODERNA COVID-19 BIVAL BOOSTER) 50 MCG/0.5ML injection Inject into the muscle. 0.5 mL 0   Diclofenac Sodium (PENNSAID) 2 % SOLN Place 1 application onto the skin 2 (two) times daily. 112 g 2   dicyclomine (BENTYL) 20 MG tablet Take 1 tablet (20 mg total) by mouth 2 (two) times daily as needed for spasms. 10 tablet 0   diphenhydramine-acetaminophen (TYLENOL PM) 25-500 MG TABS  Take 2 tablets by mouth every 4 (four) hours as needed (pain).     famotidine (PEPCID) 20 MG tablet Take 1 tablet (20 mg total) by mouth daily. 30 tablet 0   fluticasone (FLONASE) 50 MCG/ACT nasal spray USE 2 SPRAYS IN BOTH NOSTRILS  DAILY 32 g 0   Glucose Blood (BLOOD GLUCOSE TEST STRIPS) STRP Use as directed in the morning, at noon, and at bedtime. 100 strip 0   hydrALAZINE (APRESOLINE) 25 MG tablet Take 1 tablet (25 mg total) by mouth 3 (three) times daily. 90 tablet 0   influenza vaccine adjuvanted (FLUAD) 0.5 ML injection Inject into the muscle. 0.5 mL 0   Lancet Device MISC Use as directed in the morning, at noon, and at bedtime. 1 each 0   Lancets (ONETOUCH DELICA PLUS LANCET33G) MISC USE TO TEST 1-2 TIMES DAILY 100 each 0   magnesium oxide (MAG-OX) 400 (240 Mg) MG tablet Take 1 tablet by mouth once a day 120 tablet 5   magnesium oxide (MAG-OX) 400 MG tablet Take 1 tablet (400 mg total) by mouth daily. 30 tablet 5   metoprolol succinate (TOPROL-XL) 50 MG 24 hr tablet TAKE 1 TABLET BY MOUTH DAILY  WITH OR IMMEDIATELY FOLLOWING A  MEAL 100 tablet 2   omeprazole (PRILOSEC) 20 MG capsule TAKE 1 CAPSULE BY MOUTH DAILY 100 capsule 2   ondansetron (ZOFRAN ODT) 4 MG disintegrating tablet Take 1 tablet (4 mg total) by mouth every 8 (eight) hours as needed for nausea or vomiting. 15  tablet 0   ONETOUCH VERIO test strip Check blood sugar 1-2 times daily 100 each 12   TRULICITY 0.75 MG/0.5ML SOPN INJECT THE CONTENTS OF ONE PEN  SUBCUTANEOUSLY WEEKLY AS  DIRECTED 6 mL 3   Zoster Vaccine Adjuvanted [facility]) injection Inject into the muscle. 0.5 mL 1   No current facility-administered medications on file prior to visit.    BP (!) 160/78   Pulse 60   Temp 98 F (36.7 C)   Resp 18   Ht 5\' 2"  (1.575 m)   Wt 156 lb 6.4 oz (70.9 kg)   SpO2 100%   BMI 28.61 kg/m        Objective:   Physical Exam   General Mental Status- Alert. General Appearance- Not in acute distress.   Skin General: Color- Normal Color. Moisture- Normal Moisture.  Neck Carotid Arteries- Normal color. Moisture- Normal Moisture. No carotid bruits. No JVD.  Chest and Lung Exam Auscultation: Breath Sounds:-Normal.  Cardiovascular Auscultation:Rythm- Regular. Murmurs & Other Heart Sounds:Auscultation of the heart reveals- No Murmurs.  Abdomen Inspection:-Inspeection Normal. Palpation/Percussion:Note:No mass. Palpation and Percussion of the abdomen reveal- Non Tender, Non Distended + BS, no rebound or guarding.    Neurologic Cranial Nerve exam:- CN III-XII intact(No nystagmus), symmetric smile. Strength:- 5/5 equal and symmetric strength both upper and lower extremities.    Lower ext- calfs thin, symmetric, no pedal edema. No warmth. Negative homans signs.    Assessment & Plan:   Patient Instructions  Hypertension Blood pressure remains uncontrolled on current hydralazine dosage. - Increase hydralazine to 75 mg three times daily. - Continue amlodipine 10 mg daily. - Advise daily blood pressure monitoring with a new machine. - Instruct to send blood pressure readings via MyChart.  Peripheral Edema Intermittent bilateral leg swelling suggests possible CHF; DVT not likley, calves symmetric. negative homans signs and no dyspnea. [place] lower ext not indicated. - Order chest x-ray to  assess heart size and fluid accumulation. - Order BNP  test to evaluate for heart failure. - Order metabolic panel. - Consider cardiology referral based on results.  Fatigue Persistent fatigue likely due to low vitamin B12 and D levels. - Administer vitamin B12 injections weekly. - Prescribe vitamin D 50,000 IU weekly for 8 weeks. - Advise 5000 mcg oral vitamin B12 daily until injections are received.  Hyperuricemia Elevated uric acid level. - Refer to rheumatologist for evaluation and potential Uloric treatment.  Diabetic Shoe Coordination Need for updated diabetic shoe. - Staff tp assist in obtaining forms for diabetic shoes. - Coordinate with podiatrist for form completion.  Diabetes- temporarily holding off on meds since lipase elevated and decreased kidney function. Check sugars 3 times daily. update me on readings. May need to refer to endocrinoloigst.  Follow-up and Coordination Multiple referrals and follow-up plans required for comprehensive care. - Follow up with nephrologist. - Follow up with GI specialist. - Follow up with rheumatologist. - Schedule follow-up appointment based on lab and imaging results. - CT abdomen pelvis without contrast.    Follow up date to be determined after lab review.  Esperanza Richters, PA-C   Time spent with patient today was 45  minutes which consisted of chart review, discussing diagnosis, work up treatment and documentation.

## 2023-09-03 NOTE — Addendum Note (Signed)
 Addended by: Gwenevere Abbot on: 09/03/2023 10:14 PM   Modules accepted: Orders

## 2023-09-04 ENCOUNTER — Ambulatory Visit (HOSPITAL_BASED_OUTPATIENT_CLINIC_OR_DEPARTMENT_OTHER): Admission: RE | Admit: 2023-09-04 | Payer: Medicare Other | Source: Ambulatory Visit

## 2023-09-05 ENCOUNTER — Ambulatory Visit (HOSPITAL_BASED_OUTPATIENT_CLINIC_OR_DEPARTMENT_OTHER)
Admission: RE | Admit: 2023-09-05 | Discharge: 2023-09-05 | Disposition: A | Discharge status: Home / Self Care | Source: Ambulatory Visit | Attending: Medical | Admitting: Medical

## 2023-09-05 DIAGNOSIS — K861 Other chronic pancreatitis: Secondary | ICD-10-CM | POA: Diagnosis present

## 2023-09-05 DIAGNOSIS — R748 Abnormal levels of other serum enzymes: Secondary | ICD-10-CM | POA: Insufficient documentation

## 2023-09-07 ENCOUNTER — Encounter: Payer: Self-pay | Admitting: Medical

## 2023-09-10 ENCOUNTER — Other Ambulatory Visit: Payer: Self-pay

## 2023-09-10 MED ORDER — HYDRALAZINE HCL 25 MG PO TABS: 25.0000 mg | ORAL_TABLET | Freq: Three times a day (TID) | ORAL | 0 refills | Status: DC

## 2023-09-10 NOTE — Telephone Encounter (Signed)
 Spoke to patient and information was given for both her referrals [facility]). Patient also states she only has enough hydrolozine for one more day and needs a refill.

## 2023-09-11 ENCOUNTER — Other Ambulatory Visit (HOSPITAL_BASED_OUTPATIENT_CLINIC_OR_DEPARTMENT_OTHER): Payer: Self-pay

## 2023-09-11 ENCOUNTER — Other Ambulatory Visit: Payer: Self-pay | Admitting: Medical

## 2023-09-12 ENCOUNTER — Other Ambulatory Visit (HOSPITAL_BASED_OUTPATIENT_CLINIC_OR_DEPARTMENT_OTHER): Payer: Self-pay

## 2023-09-12 ENCOUNTER — Other Ambulatory Visit: Payer: Self-pay | Admitting: Emergency Medicine

## 2023-09-12 ENCOUNTER — Other Ambulatory Visit: Payer: Self-pay | Admitting: Medical

## 2023-09-12 MED ORDER — HYDRALAZINE HCL 25 MG PO TABS
25.0000 mg | ORAL_TABLET | Freq: Three times a day (TID) | ORAL | 0 refills | Status: DC
  Filled 2023-09-12: qty 90, 30d supply, fill #0

## 2023-09-17 ENCOUNTER — Telehealth: Payer: Self-pay

## 2023-09-17 NOTE — Telephone Encounter (Signed)
 Pt bp 144/80 routinely now that hydralzine increased to 25 mg 3 tab po tid. Also has appointment with podiatrist tomorrow.

## 2023-09-17 NOTE — Addendum Note (Signed)
 Addended by: Gwenevere Abbot on: 09/17/2023 12:54 PM   Modules accepted: Orders

## 2023-09-17 NOTE — Telephone Encounter (Signed)
 I talked to patient today about her CT abdomen and pelvis findings.  Explained to her particular pancreas comments and calcified uterine comments.  She has appointment upcoming with GI office to get screened regarding the appropriate appointment date.  She is going to contact [facility] which was her former GI MD. she does have appointment upcoming with gynecologist to leave early April.  She has appointment this Friday with cardiologist.  She reports intermittent leg swelling.  I discussed with her her recent mild BNP elevation but normal chest x-ray.  She has yet to see a nephrologist though we have been waiting on nephrologist appointment.  Will again ask referral coordinator to contact nephrologist awaiting coordinating with and sent lab results weeks ago.  Patient is trying to get established with MyChart as she has a lot of referrals pending and historically various labs done and I had discussed with her various times it is best for her to get my charts that she can actually see the comments and follow the referral appointment dates etc. also patient has recent left Achilles and heel pain.  I have asked her to go ahead and call her podiatrist and to see if they can see her by this Thursday.  I asked patient that she cannot see her podiatrist this week to let me know.

## 2023-09-17 NOTE — Telephone Encounter (Signed)
 Pt is on hydralazine 25 mg 3 tabs po tid. 144/80 on average now that she is on. Also has appointment with podiatrist tomorrow.

## 2023-09-17 NOTE — Telephone Encounter (Signed)
 Copied from CRM [id number]. Topic: Clinical - Lab/Test Results >> Sep 17, 2023  9:06 AM Adele Barthel wrote: Reason for CRM:   Patient called in regarding lab results and chest/foot x-rays. Reviewed note from provider concerning the lab results, not able to give imaging results as advised by management. Requested call back from [facility] or provider to further discuss results of testing.  She was able to schedule appt with Dr. Bing Matter for 03/21 at his [place] office. Does complain of foot swelling and pain when placing feet on floor. Does not want to go back to emergency room. Is elevating legs day and night.   CB# [id number]

## 2023-09-17 NOTE — Telephone Encounter (Signed)
 Unsuccessful attempts to reach patient on preferred number listed in notes for scheduled AWV. Left message on voicemail okay to reschedule.

## 2023-09-18 NOTE — Telephone Encounter (Signed)
 I spoke to [facility] and explained to them that per the provider, the patient really needs to be seen as soon as possible. They stated that they go by their rating system when it comes to scheduling and the patient wouldn't be seen for another few weeks. They were able to tell me that they will call the patient today to try to get her schedule as soon as possible if we get the new labs sent over to them. I have faxed all of the patient's recent lab work to them.

## 2023-09-19 DIAGNOSIS — M722 Plantar fascial fibromatosis: Secondary | ICD-10-CM | POA: Insufficient documentation

## 2023-09-20 ENCOUNTER — Encounter: Payer: Self-pay | Admitting: Cardiology

## 2023-09-20 ENCOUNTER — Ambulatory Visit: Attending: Cardiology | Admitting: Cardiology

## 2023-09-20 VITALS — BP 120/74 | HR 64 | Ht 64.0 in | Wt 163.8 lb

## 2023-09-20 DIAGNOSIS — I10 Essential (primary) hypertension: Secondary | ICD-10-CM

## 2023-09-20 DIAGNOSIS — N183 Chronic kidney disease, stage 3 unspecified: Secondary | ICD-10-CM | POA: Diagnosis not present

## 2023-09-20 DIAGNOSIS — E785 Hyperlipidemia, unspecified: Secondary | ICD-10-CM

## 2023-09-20 DIAGNOSIS — I350 Nonrheumatic aortic (valve) stenosis: Secondary | ICD-10-CM

## 2023-09-20 DIAGNOSIS — E1169 Type 2 diabetes mellitus with other specified complication: Secondary | ICD-10-CM

## 2023-09-20 DIAGNOSIS — G4733 Obstructive sleep apnea (adult) (pediatric): Secondary | ICD-10-CM | POA: Diagnosis not present

## 2023-09-20 DIAGNOSIS — R0609 Other forms of dyspnea: Secondary | ICD-10-CM

## 2023-09-20 NOTE — Patient Instructions (Addendum)
 Medication Instructions:  Your physician recommends that you continue on your current medications as directed. Please refer to the Current Medication list given to you today.  *If you need a refill on your cardiac medications before your next appointment, please call your pharmacy*   Lab Work: None Ordered If you have labs (blood work) drawn today and your tests are completely normal, you will receive your results only by: MyChart Message (if you have MyChart) OR A paper copy in the mail If you have any lab test that is abnormal or we need to change your treatment, we will call you to review the results.   Testing/Procedures: Your physician has requested that you have an echocardiogram. Echocardiography is a painless test that uses sound waves to create images of your heart. It provides your doctor with information about the size and shape of your heart and how well your heart's chambers and valves are working. This procedure takes approximately one hour. There are no restrictions for this procedure. Please do NOT wear cologne, perfume, aftershave, or lotions (deodorant is allowed). Please arrive 15 minutes prior to your appointment time.  Please note: We ask at that you not bring children with you during ultrasound (echo/ vascular) testing. Due to room size and safety concerns, children are not allowed in the ultrasound rooms during exams. Our front office staff cannot provide observation of children in our lobby area while testing is being conducted. An adult accompanying a patient to their appointment will only be allowed in the ultrasound room at the discretion of the ultrasound technician under special circumstances. We apologize for any inconvenience.   Your physician has requested that you have a lexiscan myoview. For further information please visit [email or web address] Please follow instruction sheet, as given.  The test will take approximately 3 to 4 hours to complete; you may bring  reading material.  If someone comes with you to your appointment, they will need to remain in the main lobby due to limited space in the testing area.    How to prepare for your Myocardial Perfusion Test: Do not eat or drink 3 hours prior to your test, except you may have water. Do not consume products containing caffeine (regular or decaffeinated) 12 hours prior to your test. (ex: coffee, chocolate, sodas, tea). Do bring a list of your current medications with you.  If not listed below, you may take your medications as normal. Do wear comfortable clothes (no dresses or overalls) and walking shoes, tennis shoes preferred (No heels or open toe shoes are allowed). Do NOT wear cologne, perfume, aftershave, or lotions (deodorant is allowed). If these instructions are not followed, your test will have to be rescheduled.     Follow-Up: At [facility], you and your health needs are our priority.  As part of our continuing mission to provide you with exceptional heart care, we have created designated Provider Care Teams.  These Care Teams include your primary Cardiologist (physician) and Advanced Practice Providers (APPs -  Physician Assistants and Nurse Practitioners) who all work together to provide you with the care you need, when you need it.  We recommend signing up for the patient portal called "MyChart".  Sign up information is provided on this After Visit Summary.  MyChart is used to connect with patients for Virtual Visits (Telemedicine).  Patients are able to view lab/test results, encounter notes, upcoming appointments, etc.  Non-urgent messages can be sent to your provider as well.   To learn more about what you  can do with MyChart, go to [email or web address].    Your next appointment:   6 week(s)  The format for your next appointment:   In Person  Provider:   Gypsy Balsam, MD    Other Instructions NA

## 2023-09-20 NOTE — Addendum Note (Signed)
 Addended by: Baldo Ash D on: 09/20/2023 02:52 PM   Modules accepted: Orders
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
## 2023-09-24 ENCOUNTER — Other Ambulatory Visit (HOSPITAL_BASED_OUTPATIENT_CLINIC_OR_DEPARTMENT_OTHER): Payer: Self-pay

## 2023-09-27 NOTE — Addendum Note (Signed)
 Addended by: Gypsy Balsam on: 09/27/2023 08:03 AM   Modules accepted: Orders

## 2023-09-30 ENCOUNTER — Other Ambulatory Visit: Payer: Self-pay | Admitting: Medical

## 2023-10-02 ENCOUNTER — Ambulatory Visit: Payer: Medicare Other | Admitting: Obstetrics and Gynecology

## 2023-10-02 VITALS — BP 156/78 | HR 58 | Ht 64.0 in | Wt 153.0 lb

## 2023-10-02 DIAGNOSIS — K5900 Constipation, unspecified: Secondary | ICD-10-CM

## 2023-10-02 DIAGNOSIS — K5790 Diverticulosis of intestine, part unspecified, without perforation or abscess without bleeding: Secondary | ICD-10-CM | POA: Diagnosis not present

## 2023-10-02 NOTE — Progress Notes (Signed)
 GYNECOLOGY OFFICE VISIT NOTE  History:   Olivia Morales is a 73 y.o. No obstetric history on file. here today as a referral from her PCP for suprapubic tenderness with palpation at her last exam. Denies pain today. Request condoms. She denies any abnormal vaginal discharge, bleeding, pelvic pain or other concerns.  Denies bloating, weight gain or loss, fever/chills.   Postmenopausal Sexually active with single partner. Denies dyspareunia.    Past Medical History:  Diagnosis Date   Diabetes mellitus    Hypercholesteremia    Hypertension    Sickle cell trait (HCC)    Sleep apnea     Past Surgical History:  Procedure Laterality Date   TUBAL LIGATION      The following portions of the patient's history were reviewed and updated as appropriate: allergies, current medications, past family history, past medical history, past social history, past surgical history and problem list.   Health Maintenance:  Normal mammogram on 08/29/2023.   Review of Systems:  Pertinent items noted in HPI and remainder of comprehensive ROS otherwise negative.  Physical Exam:  BP (!) 156/78   Pulse (!) 58   Ht 5\' 4"  (1.626 m)   Wt 153 lb (69.4 kg)   BMI 26.26 kg/m  CONSTITUTIONAL: Well-developed, well-nourished female in no acute distress.  HEENT:  Normocephalic, atraumatic. External right and left ear normal. No scleral icterus. Normal thyroid. NECK: Normal range of motion, supple, no masses noted on observation SKIN: No rash noted. Not diaphoretic. No erythema. No pallor. MUSCULOSKELETAL: Normal range of motion. No edema noted. NEUROLOGIC: Alert and oriented to person, place, and time. Normal muscle tone coordination.  PSYCHIATRIC: Normal mood and affect. Normal behavior. Normal judgment and thought content. CARDIOVASCULAR: Normal heart rate noted RESPIRATORY: Effort and breath sounds normal, no problems with respiration noted ABDOMEN: No masses noted. No other overt distention noted.     Labs and Imaging No results found for this or any previous visit (from the past week). CT ABDOMEN PELVIS WO CONTRAST Result Date: 09/05/2023 CLINICAL DATA:  Pancreatitis with persistent elevated lipase. Nausea EXAM: CT ABDOMEN AND PELVIS WITHOUT CONTRAST TECHNIQUE: Multidetector CT imaging of the abdomen and pelvis was performed following the standard protocol without IV contrast. RADIATION DOSE REDUCTION: This exam was performed according to the departmental dose-optimization program which includes automated exposure control, adjustment of the mA and/or kV according to patient size and/or use of iterative reconstruction technique. COMPARISON:  CT 10/18/2020. FINDINGS: Lower chest: Heart is borderline enlarged. There is some linear opacity at the bases likely scar or atelectasis. No pleural effusion. Coronary artery calcifications are seen. Hepatobiliary: On this non IV contrast exam, grossly preserved hepatic parenchyma. Gallbladder is present. Pancreas: Preserved pancreatic parenchyma. Minimal stranding seen adjacent to the pancreas. Please correlate with provided history of pancreatitis. No clear well-defined fluid collections on this noncontrast exam. Spleen: Normal in size without focal abnormality. Adrenals/Urinary Tract: The adrenal glands are preserved. Upper pole small left-sided renal cystic lesion again identified measuring Hounsfield unit of 11 and diameter of 10 mm. No specific imaging follow-up. No abnormal calcification seen within either kidney nor along the expected course of either ureter. Underdistended urinary bladder. Grossly preserved contour. Stomach/Bowel: Normal retrocecal appendix. On this non oral contrast exam large bowel has a normal course and caliber with scattered stool. Scattered colonic diverticula. There is significant stool in the rectum. No adjacent inflammatory changes at this time. The stomach is underdistended but there is a appearance of some mild fold thickening.  Please correlate  with any symptoms. Question slight stranding. Small bowel is nondilated. Vascular/Lymphatic: Scattered diffuse vascular calcifications along the aorta and branch vessels. Normal caliber aorta and IVC. Small lymph node identified along the gastrohepatic ligament region measuring 12 by 9 mm. No discrete abnormal lymph node enlargement identified in the abdomen and pelvis. Reproductive: Calcified uterine fibroid.  No separate adnexal mass. Other: No free intra-abdominal air. No well-defined fluid collections. Musculoskeletal: Curvature of the spine. Diffuse degenerative changes of the spine and pelvis. There is diffuse sclerosis and trabecular thickening along the right hemipelvis. Please correlate with history including possible Paget's disease. This was seen previously. IMPRESSION: Fold thickening along the stomach with slight stranding. Please correlate for any symptoms of gastritis. This does abut the area of the pancreas but there is preserved pancreatic parenchyma at this time. No well-defined fluid collections. Please correlate with specific clinical presentation. An IV contrast study may be of some benefit to further define these structures and abnormality. No bowel obstruction. Scattered colonic diverticula. Significant stool in the rectum. Calcified uterine fibroid. Sclerosis and trabecular thickening of all vein the right hemipelvis. Please correlate with history including such as Paget's disease. Electronically Signed   By: Karen Kays M.D.   On: 09/05/2023 17:33   DG Chest 2 View Result Date: 09/03/2023 CLINICAL DATA:  Pedal edema. EXAM: CHEST - 2 VIEW COMPARISON:  August 16, 2023. FINDINGS: The heart size and mediastinal contours are within normal limits. Both lungs are clear. The visualized skeletal structures are unremarkable. IMPRESSION: No active cardiopulmonary disease. Electronically Signed   By: Lupita Raider M.D.   On: 09/03/2023 15:22      Assessment and Plan:      1.  Diverticulosis (Primary)  2. Constipation, unspecified constipation type Chronic constipation, with subsequent diverticulosis   Continue PRN stool softeners to maintain an easy stool every 24-48 hours High fiber diet Limit sodium, sugar, and processes foods Encouraged safe sex, and regular physical activity  Routine preventative health maintenance measures emphasized. Please refer to After Visit Summary for other counseling recommendations.   No follow-ups on file.     Wyn Forster, MD OB Fellow, Faculty Practice [facility], [facility] 10/02/2023 9:32 AM

## 2023-10-09 ENCOUNTER — Encounter (HOSPITAL_COMMUNITY): Payer: Self-pay

## 2023-10-09 ENCOUNTER — Other Ambulatory Visit: Payer: Self-pay | Admitting: Medical

## 2023-10-09 ENCOUNTER — Other Ambulatory Visit (HOSPITAL_BASED_OUTPATIENT_CLINIC_OR_DEPARTMENT_OTHER): Payer: Self-pay

## 2023-10-09 ENCOUNTER — Telehealth: Payer: Self-pay | Admitting: Medical

## 2023-10-09 NOTE — Telephone Encounter (Signed)
 Copied from CRM [id number]. Topic: Clinical - Medication Refill >> Oct 09, 2023  1:43 PM [place] J wrote: Most Recent Primary Care Visit:  Provider: Esperanza Richters  Department: [facility]  Visit Type: OFFICE VISIT  Date: 09/03/2023  Medication: hydrALAZINE (APRESOLINE) 25 MG  Has the patient contacted their pharmacy? Yes (Agent: If no, request that the patient contact the pharmacy for the refill. If patient does not wish to contact the pharmacy document the reason why and proceed with request.) (Agent: If yes, when and what did the pharmacy advise?)  Is this the correct pharmacy for this prescription? Yes If no, delete pharmacy and type the correct one.  This is the patient's preferred pharmacy:   [facility] [place] [facility] [address] [place] [address] Phone: [phone] Fax: [phone]  Has the prescription been filled recently? No  Is the patient out of the medication? Yes  Has the patient been seen for an appointment in the last year OR does the patient have an upcoming appointment? Yes  Can we respond through MyChart? Yes  Agent: Please be advised that Rx refills may take up to 3 business days. We ask that you follow-up with your pharmacy.

## 2023-10-09 NOTE — Telephone Encounter (Signed)
 Triage nurse called stating pt was asking to speak with hannah and no one else regarding her hydralazine. Pt does not want medication sent through [facility] anymore and only wants her meds sent to the pharmacy downstairs. Pt has a blood pressure of 200/111 and states she is out of her medication. Triage nurse was able to get hydrolazine sent in but it wont be ready for three days and wants to know if we can call something in tonight for pt to pick up. Pt wants her medicine tonight and only wants to speak with [facility] Client. Pt told the agent "she was going to die".  Please call pt back.

## 2023-10-09 NOTE — Telephone Encounter (Signed)
 Pt called to put in med refill request of Hydralazine. RN called [facility] pharmacy and medication had not been run by insurance. Tech was able to process the order from 09/30/23. RN called pt to let her know medication would arrive in 3-5 days. Pt was very hostile and demanding. Pt stated, "my bp is 200/100 and I want to take to [facility]. Why do I have to talk to triage nurse? I don't want them I'm going to die." Pt refused to be triaged. RN notified CAL of experience and asked for message to be relayed to [facility] Client to return pt call. Pt is asking for medication to be "sent downstairs." Pt does not want to use [facility] pharmacy ever again.

## 2023-10-10 ENCOUNTER — Other Ambulatory Visit (HOSPITAL_BASED_OUTPATIENT_CLINIC_OR_DEPARTMENT_OTHER): Payer: Self-pay

## 2023-10-10 ENCOUNTER — Ambulatory Visit (HOSPITAL_BASED_OUTPATIENT_CLINIC_OR_DEPARTMENT_OTHER)
Admission: RE | Admit: 2023-10-10 | Discharge: 2023-10-10 | Disposition: A | Discharge status: Home / Self Care | Source: Ambulatory Visit | Attending: Cardiology | Admitting: Cardiology

## 2023-10-10 ENCOUNTER — Telehealth: Payer: Self-pay | Admitting: Medical

## 2023-10-10 DIAGNOSIS — I350 Nonrheumatic aortic (valve) stenosis: Secondary | ICD-10-CM | POA: Insufficient documentation

## 2023-10-10 LAB — ECHOCARDIOGRAM COMPLETE
AR max vel: 2.1 cm2
AV Area VTI: 2.3 cm2
AV Area mean vel: 2.27 cm2
AV Mean grad: 7 mmHg
AV Peak grad: 13.4 mmHg
Ao pk vel: 1.83 m/s
Area-P 1/2: 4.21 cm2
Calc EF: 64.2 %
MV M vel: 5.87 m/s
MV Peak grad: 137.8 mmHg
S' Lateral: 2.3 cm
Single Plane A2C EF: 65 %
Single Plane A4C EF: 65.6 %

## 2023-10-10 MED ORDER — HYDRALAZINE HCL 25 MG PO TABS
75.0000 mg | ORAL_TABLET | Freq: Three times a day (TID) | ORAL | 11 refills | Status: DC
  Filled 2023-10-10: qty 270, 30d supply, fill #0

## 2023-10-10 NOTE — Addendum Note (Signed)
 Addended by: Gwenevere Abbot on: 10/10/2023 10:31 AM   Modules accepted: Orders

## 2023-10-10 NOTE — Telephone Encounter (Unsigned)
 Copied from CRM [id number]. Topic: General - Other >> Oct 10, 2023 11:02 AM Darl Householder wrote: Reason for CRM: Patient received missed call, returning call back relayed note left in chart, stating the refill was sent to down stairs med center and to go to ER for emergency, Patient would still like a call back from [place].

## 2023-10-10 NOTE — Telephone Encounter (Signed)
 Pt called and unable to reach pt

## 2023-10-14 ENCOUNTER — Ambulatory Visit (HOSPITAL_COMMUNITY): Attending: Cardiology

## 2023-10-14 DIAGNOSIS — R0609 Other forms of dyspnea: Secondary | ICD-10-CM | POA: Insufficient documentation

## 2023-10-14 LAB — MYOCARDIAL PERFUSION IMAGING
Base ST Depression (mm): 0 mm
LV dias vol: 94 mL (ref 46–106)
LV sys vol: 24 mL
Nuc Stress EF: 74 %
Peak HR: 75 {beats}/min
Rest HR: 63 {beats}/min
Rest Nuclear Isotope Dose: 10.5 mCi
SDS: 0
SRS: 0
SSS: 0
ST Depression (mm): 0 mm
Stress Nuclear Isotope Dose: 30.8 mCi
TID: 1.09

## 2023-10-14 MED ORDER — TECHNETIUM TC 99M TETROFOSMIN IV KIT
10.5000 | PACK | Freq: Once | INTRAVENOUS | Status: AC | PRN
  Administered 2023-10-14: 10.5 via INTRAVENOUS

## 2023-10-14 MED ORDER — REGADENOSON 0.4 MG/5ML IV SOLN
0.4000 mg | Freq: Once | INTRAVENOUS | Status: AC
  Administered 2023-10-14: 0.4 mg via INTRAVENOUS

## 2023-10-14 MED ORDER — TECHNETIUM TC 99M TETROFOSMIN IV KIT
30.8000 | PACK | Freq: Once | INTRAVENOUS | Status: AC | PRN
  Administered 2023-10-14: 30.8 via INTRAVENOUS

## 2023-10-15 ENCOUNTER — Telehealth: Payer: Self-pay

## 2023-10-15 NOTE — Telephone Encounter (Signed)
-----   Message from Ralene Burger sent at 10/15/2023  8:26 AM EDT ----- Stressed this is normal

## 2023-10-15 NOTE — Telephone Encounter (Signed)
 Patient notified through my chart and LM informing pt of my chart message.

## 2023-10-21 ENCOUNTER — Other Ambulatory Visit: Payer: Self-pay | Admitting: Medical

## 2023-10-23 ENCOUNTER — Ambulatory Visit: Admitting: Cardiology

## 2023-10-28 ENCOUNTER — Ambulatory Visit (HOSPITAL_BASED_OUTPATIENT_CLINIC_OR_DEPARTMENT_OTHER)
Admission: RE | Admit: 2023-10-28 | Discharge: 2023-10-28 | Disposition: A | Discharge status: Home / Self Care | Source: Ambulatory Visit | Attending: Medical | Admitting: Medical

## 2023-10-28 ENCOUNTER — Encounter: Payer: Self-pay | Admitting: Medical

## 2023-10-28 ENCOUNTER — Other Ambulatory Visit (HOSPITAL_BASED_OUTPATIENT_CLINIC_OR_DEPARTMENT_OTHER): Payer: Self-pay

## 2023-10-28 ENCOUNTER — Ambulatory Visit: Admitting: Medical

## 2023-10-28 VITALS — BP 170/80 | HR 56 | Temp 98.2°F | Resp 12 | Ht 64.0 in | Wt 154.8 lb

## 2023-10-28 DIAGNOSIS — I509 Heart failure, unspecified: Secondary | ICD-10-CM | POA: Insufficient documentation

## 2023-10-28 DIAGNOSIS — I10 Essential (primary) hypertension: Secondary | ICD-10-CM | POA: Diagnosis not present

## 2023-10-28 DIAGNOSIS — M1000 Idiopathic gout, unspecified site: Secondary | ICD-10-CM

## 2023-10-28 DIAGNOSIS — R7989 Other specified abnormal findings of blood chemistry: Secondary | ICD-10-CM

## 2023-10-28 DIAGNOSIS — Z7985 Long-term (current) use of injectable non-insulin antidiabetic drugs: Secondary | ICD-10-CM

## 2023-10-28 DIAGNOSIS — Z7984 Long term (current) use of oral hypoglycemic drugs: Secondary | ICD-10-CM

## 2023-10-28 DIAGNOSIS — N189 Chronic kidney disease, unspecified: Secondary | ICD-10-CM

## 2023-10-28 DIAGNOSIS — E1165 Type 2 diabetes mellitus with hyperglycemia: Secondary | ICD-10-CM

## 2023-10-28 DIAGNOSIS — D649 Anemia, unspecified: Secondary | ICD-10-CM | POA: Diagnosis not present

## 2023-10-28 LAB — BRAIN NATRIURETIC PEPTIDE: Brain Natriuretic Peptide: 123 pg/mL — ABNORMAL HIGH (ref <100)

## 2023-10-28 MED ORDER — TRAMADOL HCL 50 MG PO TABS
50.0000 mg | ORAL_TABLET | Freq: Three times a day (TID) | ORAL | 0 refills | Status: DC | PRN
  Filled 2023-10-28: qty 12, 4d supply, fill #0

## 2023-10-28 NOTE — Patient Instructions (Addendum)
 Congestive Heart Failure (CHF) Based on records reviewed in epic. Recent CHF exacerbation with lower extremity edema. Ejection fraction 60-65%. BNP moderately elevated at 417 in past(records reviewed in epic). Worse levels in [place]  when hospitalized - Order stat BNP, metabolic panel, chest x-ray, and potassium level. - Follow up with cardiologist, Dr. Krazowski, on Friday. - Consider wedge pillows for CHF management, pending insurance coverage.(Elevate legs using pillows per pt request)  Hypertension Hypertension with inadequate control, recent readings 170/90 and 170/80. No neurologic signs/symtpoms.  - Increase hydralazine  to 25 mg, four tablets, three times a day. - Continue amlodipine  10 mg daily. - Recommend purchasing a new blood pressure monitor with fresh batteries. - Check blood pressure after three doses of increased hydralazine  and update provider with readings. -update me on bp reading tomorrow afternoon.  Diabetes Mellitus Diabetes mellitus with adequate glycemic control, recent A1c 5.8%. - Repeat A1c test.  Anemia Anemia under evaluation, currently on ferrous sulfate 325 mg daily. - Check CBC and iron panel.  Gout Gout under evaluation, advised against NSAIDs due to potential adverse effects. - Check uric acid level. - Avoid NSAIDs, including naproxen.  CKD- Nephrologist has called you on 09-17-2023. you did not get the message. Will ask staff to call again tomorrow and coordinate appointment asking them to see you within 1-2 weeks.   Follow with me next monday or tuesday    I followed the patient's labs last night and noticed that the metabolic panel did not come back yet although I did order the study as stat.  Checked again this morning and noticed that it was not back.  Sent message staff members in the lab asking to investigate why lab was not back.  Unsure but it appears it was mislabeling?  So staff tried to call patient and had to leave messages.  I did call  patient in between patient's and did get through.  I apologized that lab was not done.  Did explain to her that among all the labs that I ordered stat the metabolic panel/kidney function test was the most important.*Please get study done this morning and she states she will get the study done.  As of last time I asked staff if patient had come in today's she had not.  Will reach out to a staff member to call patient to get her in for that lab.

## 2023-10-28 NOTE — Progress Notes (Signed)
 Subjective:    Patient ID: Olivia Morales, female    DOB: August 25, 1950, 73 y.o.   MRN: [id number]  HPI  Pt last AVS in march with me was below.  "Hypertension Blood pressure remains uncontrolled on current hydralazine  dosage. - Increase hydralazine  to 75 mg three times daily. - Continue amlodipine  10 mg daily. - Advise daily blood pressure monitoring with a new machine. - Instruct to send blood pressure readings via MyChart.   Peripheral Edema Intermittent bilateral leg swelling suggests possible CHF; DVT not likley, calves symmetric. negative homans signs and no dyspnea. US  lower ext not indicated. - Order chest x-ray to assess heart size and fluid accumulation. - Order BNP test to evaluate for heart failure. - Order metabolic panel. - Consider cardiology referral based on results.   Fatigue Persistent fatigue likely due to low vitamin B12 and D levels. - Administer vitamin B12 injections weekly. - Prescribe vitamin D  50,000 IU weekly for 8 weeks. - Advise 5000 mcg oral vitamin B12 daily until injections are received.   Hyperuricemia Elevated uric acid level. - Refer to rheumatologist for evaluation and potential Uloric treatment.   Diabetic Shoe Coordination Need for updated diabetic shoe. - Staff tp assist in obtaining forms for diabetic shoes. - Coordinate with podiatrist for form completion.   Diabetes- temporarily holding off on meds since lipase elevated and decreased kidney function. Check sugars 3 times daily. update me on readings. May need to refer to endocrinoloigst.   Follow-up and Coordination Multiple referrals and follow-up plans required for comprehensive care. - Follow up with nephrologist. - Follow up with GI specialist. - Follow up with rheumatologist. - Schedule follow-up appointment based on lab and imaging results. - CT abdomen pelvis without contrast."  During interim pt has seen cardilogist local  Recently seen by ED out of town.     "Chief Complaint:   Patient is here today post ER discharge.  HPI:   Olivia Morales is a 73 year old female with hypertension, diabetes mellitus type 2, hyperlipidemia, sickle cell trait who has been having dyspnea on exertion and lower extremity swelling that has been going on for some time.  Of note, the patient lives in [place]  and follows up with cardiology over there, recently had stress test and echocardiogram done there. She presented to [facility] ER on 10/16/2023 with complaints of bilateral lower extremity swelling and elevated blood pressure. Workup in the ER revealed elevated NT proBNP, bilateral lower extremity duplex was negative for DVT.  Patient is here today post ER discharge. The patient mentioned that since starting Lasix her lower extremity swelling has been better. She did complain of shortness of breath walking short distances. Denied chest pain, palpitations, lightheadedness, dizziness.   Assessment and Plan  1. Chronic diastolic heart failure Basic metabolic panel   2. Hypertension, unspecified type    # Chronic diastolic heart failure:  Patient has been having dyspnea on exertion and lower extremity swelling going on for some time. NT-proBNP-10/16/2023-2.9K. Echo-10/2023-EF-60-65%, moderate left ventricular hypertrophy, grade 2 diastolic dysfunction. Stress MPI-10/2023-negative for inducible ischemia. Lower extremity venous duplex-10/2023-negative for DVT. Continue Lasix 40 mg daily, refilled potassium supplements. Started the patient on Jardiance 10 mg daily. Ordered repeat basic metabolic panel to be done in 7 days.  # Hypertension:  Continue current medications, advised the patient to monitor her blood pressure at home and bring the diary with her in 4 weeks.  # CKD:  Advised the patient to follow-up with nephrology.  Follow back with patient in 4  weeks."  In past had referred pt to Dr. Krazowski for below.  "Pt had intermittent pedal  edema. Some days prominent other days none. I think likely dependant edema. Pt daughter who had mi has chf. Pt concerned she may get chf. She does have htn. Recent difficult getting controlled and med changes when her kidney function numbers decreased. Numbers kidney number improved. Her bnp is 417. No cardiomegaly. She request to see Cardiologist. Wanted pt to see Dr. Krazowski.Can she see in within 3-4 weeks."   Pt legs are still swollen and has pain in both legs.     Olivia Morales is a 73 year old female with congestive heart failure who presents with leg, ankle, and foot swelling.  She has experienced significant swelling in her legs, ankles, and feet, which began after a flight. The swelling was severe enough to prevent her from wearing her usual shoe size.  She visited the emergency room in [place]  and was administered Lasix and potassium. Currently, she is on Lasix 40 mg daily, potassium 10 mEq twice daily, Jardiance 10 mg, and ferrous sulfate 325 mg daily.  She has a history mild- moderate BNP level of 417 in early March.. An echocardiogram on April 10 showed an ejection fraction of 60-65%. The swelling in lower ext in [place]  was severe before she was evaluated and treated/started on daily lasix.Olivia Morales Pt thinks in [place]  she did not get chest xray. We have care everywhere but I don't see cxr report done in [place] ? Pt states they did [place]  of lower ext. on dc summary no report of any dvt.  Last 4 chest xray that I can see showed no acute cardiopulmonary process. No chf type findings on those cxrs.  Her blood pressure has been variable, with recent readings of 120/74 at cardilogist visit in [place] and 156/78 later on other specialist visit. She is currently taking hydralazine  75 mg three times a day and amlodipine  10 mg daily.  In past we have relied on phone calls to relay messages/lab results.  She mentions difficulty with communication through 'My Chart' though it appears my chart is active. I think pt  family does not live local. I think pt family updated on her health condition when she was hospitalized most recently. Pt describes difficulty working with computers. I had suggested in past to give family access to my chart in order to help coordinate care. Particular example would be attempts to successfully refer to nephrologst.   She denies receiving a chest x-ray during her recent hospitalization in [place] . She also reports that her blood pressure machine is 71 years old and may not be functioning properly.  Review of Systems  Constitutional:  Negative for chills and fever.  Respiratory:  Positive for shortness of breath. Negative for cough, chest tightness and wheezing.   Cardiovascular:  Negative for chest pain and palpitations.  Gastrointestinal:  Negative for abdominal pain.  Genitourinary:  Negative for dysuria and frequency.  Musculoskeletal:        Lower ext pain.  Neurological:  Negative for dizziness, speech difficulty, weakness and light-headedness.  Hematological:  Negative for adenopathy. Does not bruise/bleed easily.  Psychiatric/Behavioral:  Negative for behavioral problems and decreased concentration.     Past Medical History:  Diagnosis Date  . Diabetes mellitus   . Hypercholesteremia   . Hypertension   . Sickle cell trait (HCC)   . Sleep apnea      Social History   Socioeconomic History  . Marital status: Widowed  Spouse name: Not on file  . Number of children: Not on file  . Years of education: Not on file  . Highest education level: Not on file  Occupational History  . Not on file  Tobacco Use  . Smoking status: Never  . Smokeless tobacco: Never  Vaping Use  . Vaping status: Never Used  Substance and Sexual Activity  . Alcohol use: Yes    Comment: weekly  . Drug use: No  . Sexual activity: Not on file  Other Topics Concern  . Not on file  Social History Narrative  . Not on file   Social Drivers of Health   Financial Resource Strain: Low  Risk  (06/05/2021)   Overall Financial Resource Strain (CARDIA)   . Difficulty of Paying Living Expenses: Not hard at all  Food Insecurity: No Food Insecurity (08/23/2022)   Hunger Vital Sign   . Worried About Programme researcher, broadcasting/film/video in the Last Year: Never true   . Ran Out of Food in the Last Year: Never true  Transportation Needs: No Transportation Needs (08/23/2022)   PRAPARE - Transportation   . Lack of Transportation (Medical): No   . Lack of Transportation (Non-Medical): No  Physical Activity: Inactive (06/05/2021)   Exercise Vital Sign   . Days of Exercise per Week: 0 days   . Minutes of Exercise per Session: 0 min  Stress: No Stress Concern Present (06/05/2021)   [employer] of Occupational Health - Occupational Stress Questionnaire   . Feeling of Stress : Not at all  Social Connections: Moderately Integrated (06/05/2021)   Social Connection and Isolation Panel [NHANES]   . Frequency of Communication with Friends and Family: More than three times a week   . Frequency of Social Gatherings with Friends and Family: More than three times a week   . Attends Religious Services: More than 4 times per year   . Active Member of Clubs or Organizations: Yes   . Attends Banker Meetings: More than 4 times per year   . Marital Status: Widowed  Intimate Partner Violence: Not At Risk (08/23/2022)   Humiliation, Afraid, Rape, and Kick questionnaire   . Fear of Current or Ex-Partner: No   . Emotionally Abused: No   . Physically Abused: No   . Sexually Abused: No    Past Surgical History:  Procedure Laterality Date  . TUBAL LIGATION      Family History  Problem Relation Age of Onset  . Sickle cell anemia Father     Allergies  Allergen Reactions  . Ace Inhibitors     Cough    Current Outpatient Medications on File Prior to Visit  Medication Sig Dispense Refill  . amLODipine  (NORVASC ) 10 MG tablet TAKE 1 TABLET BY MOUTH DAILY 100 tablet 2  . atorvastatin  (LIPITOR) 40  MG tablet TAKE 1 TABLET BY MOUTH DAILY 100 tablet 2  . benzonatate  (TESSALON ) 100 MG capsule Take 1 capsule (100 mg total) by mouth 3 (three) times daily as needed for cough. 30 capsule 0  . Blood Glucose Monitoring Suppl (BLOOD GLUCOSE MONITOR SYSTEM) w/Device KIT Use as directed in the morning, at noon, and at bedtime. 1 kit 0  . Cyanocobalamin  (VITAMIN B-12 PO) Take 1 tablet by mouth once a week.    . empagliflozin (JARDIANCE) 10 MG TABS tablet Take 10 mg by mouth daily.    . famotidine  (PEPCID ) 10 MG tablet Take 10 mg by mouth daily.    . ferrous sulfate 325 (  65 FE) MG tablet Take 325 mg by mouth daily with breakfast.    . furosemide (LASIX) 40 MG tablet Take 40 mg by mouth daily.    . hydrALAZINE  (APRESOLINE ) 25 MG tablet Take 3 tablets (75 mg total) by mouth 3 (three) times daily. 270 tablet 11  . Lancets (ONETOUCH DELICA PLUS LANCET33G) MISC USE TO TEST 1-2 TIMES DAILY 100 each 0  . metoprolol  succinate (TOPROL -XL) 50 MG 24 hr tablet TAKE 1 TABLET BY MOUTH DAILY  WITH OR IMMEDIATELY FOLLOWING A  MEAL (Patient taking differently: Take 50 mg by mouth daily.) 100 tablet 2  . ONETOUCH VERIO test strip Check blood sugar 1-2 times daily 100 each 12  . potassium chloride  (MICRO-K ) 10 MEQ CR capsule Take 10 mEq by mouth 2 (two) times daily.    . TRULICITY  0.75 MG/0.5ML SOAJ INJECT THE CONTENTS OF ONE PEN  SUBCUTANEOUSLY WEEKLY AS  DIRECTED 6 mL 3  . Vitamin D , Ergocalciferol , (DRISDOL ) 1.25 MG (50000 UNIT) CAPS capsule Take 1 capsule (50,000 Units total) by mouth every 7 (seven) days. 8 capsule 0   No current facility-administered medications on file prior to visit.    BP (!) 170/80   Pulse (!) 56   Temp 98.2 F (36.8 C) (Oral)   Resp 12   Ht 5\' 4"  (1.626 m)   Wt 154 lb 12.8 oz (70.2 kg)   SpO2 100%   BMI 26.57 kg/m        Objective:   Physical Exam   General Mental Status- Alert. General Appearance- Not in acute distress.     Neck  No JVD.  Chest and Lung  Exam Auscultation: Breath Sounds:-Normal.  Cardiovascular Auscultation:Rythm- Regular. Murmurs & Other Heart Sounds:Auscultation of the heart reveals- No Murmurs.  Abdomen Inspection:-Inspeection Normal. Palpation/Percussion:Note:No mass. Palpation and Percussion of the abdomen reveal- Non Tender, Non Distended + BS, no rebound or guarding.   Neurologic Cranial Nerve exam:- CN III-XII intact(No nystagmus), symmetric smile. Strength:- 5/5 equal and symmetric strength both upper and lower extremities.    Lower ext- bilateral symetric 2-3+ pedal edema. Calfs symmetric. Negative homans signs.    Assessment & Plan:   Patient Instructions  Congestive Heart Failure (CHF) Recent CHF exacerbation with lower extremity edema. Ejection fraction 60-65%. BNP moderately elevated at 417. Worse levels in [place]  when hospitalized - Order stat BNP, metabolic panel, chest x-ray, and potassium level. - Follow up with cardiologist, Dr. Krazowski, on Friday. - Consider wedge pillows for CHF management, pending insurance coverage.  Hypertension Hypertension with inadequate control, recent readings 170/90 and 170/80. - Increase hydralazine  to 25 mg, four tablets, three times a day. - Continue amlodipine  10 mg daily. - Recommend purchasing a new blood pressure monitor with fresh batteries. - Check blood pressure after three doses of increased hydralazine  and update provider with readings. -update me on bp reading tomorrow afternoon.  Diabetes Mellitus Diabetes mellitus with adequate glycemic control, recent A1c 5.8%. - Repeat A1c test.  Anemia Anemia under evaluation, currently on ferrous sulfate 325 mg daily. - Check CBC and iron panel.  Gout Gout under evaluation, advised against NSAIDs due to potential adverse effects. - Check uric acid level. - Avoid NSAIDs, including naproxen.  CKD- Nephrologist has call you on 09-17-2023. you did not get the message. Will ask staff to call again tomorrow  and coordinate appointment asking them to see you within 1-2 weeks.   Follow with me next monday or tuesday    Sylvia Everts, PA-C   Time spent  with patient today was  55 minutes which consisted of chart revdiew, discussing diagnosis, work up treatment and documentation.

## 2023-10-28 NOTE — Progress Notes (Incomplete Revision)
 Subjective:    Patient ID: Olivia Morales, female    DOB: 1951/05/29, 73 y.o.   MRN: [id number]  HPI  Pt last AVS in march with me was below.  "Hypertension Blood pressure remains uncontrolled on current hydralazine  dosage. - Increase hydralazine  to 75 mg three times daily. - Continue amlodipine  10 mg daily. - Advise daily blood pressure monitoring with a new machine. - Instruct to send blood pressure readings via MyChart.   Peripheral Edema Intermittent bilateral leg swelling suggests possible CHF; DVT not likley, calves symmetric. negative homans signs and no dyspnea. US  lower ext not indicated. - Order chest x-ray to assess heart size and fluid accumulation. - Order BNP test to evaluate for heart failure. - Order metabolic panel. - Consider cardiology referral based on results.   Fatigue Persistent fatigue likely due to low vitamin B12 and D levels. - Administer vitamin B12 injections weekly. - Prescribe vitamin D  50,000 IU weekly for 8 weeks. - Advise 5000 mcg oral vitamin B12 daily until injections are received.   Hyperuricemia Elevated uric acid level. - Refer to rheumatologist for evaluation and potential Uloric treatment.   Diabetic Shoe Coordination Need for updated diabetic shoe. - Staff tp assist in obtaining forms for diabetic shoes. - Coordinate with podiatrist for form completion.   Diabetes- temporarily holding off on meds since lipase elevated and decreased kidney function. Check sugars 3 times daily. update me on readings. May need to refer to endocrinoloigst.   Follow-up and Coordination Multiple referrals and follow-up plans required for comprehensive care. - Follow up with nephrologist. - Follow up with GI specialist. - Follow up with rheumatologist. - Schedule follow-up appointment based on lab and imaging results. - CT abdomen pelvis without contrast."  During interim pt has seen cardilogist local  Recently seen by ED out of town.     "Chief Complaint:   Patient is here today post ER discharge.  HPI:   Olivia Morales is a 73 year old female with hypertension, diabetes mellitus type 2, hyperlipidemia, sickle cell trait who has been having dyspnea on exertion and lower extremity swelling that has been going on for some time.  Of note, the patient lives in [place]  and follows up with cardiology over there, recently had stress test and echocardiogram done there. She presented to [facility] ER on 10/16/2023 with complaints of bilateral lower extremity swelling and elevated blood pressure. Workup in the ER revealed elevated NT proBNP, bilateral lower extremity duplex was negative for DVT.  Patient is here today post ER discharge. The patient mentioned that since starting Lasix her lower extremity swelling has been better. She did complain of shortness of breath walking short distances. Denied chest pain, palpitations, lightheadedness, dizziness.   Assessment and Plan  1. Chronic diastolic heart failure Basic metabolic panel   2. Hypertension, unspecified type    # Chronic diastolic heart failure:  Patient has been having dyspnea on exertion and lower extremity swelling going on for some time. NT-proBNP-10/16/2023-2.9K. Echo-10/2023-EF-60-65%, moderate left ventricular hypertrophy, grade 2 diastolic dysfunction. Stress MPI-10/2023-negative for inducible ischemia. Lower extremity venous duplex-10/2023-negative for DVT. Continue Lasix 40 mg daily, refilled potassium supplements. Started the patient on Jardiance 10 mg daily. Ordered repeat basic metabolic panel to be done in 7 days.  # Hypertension:  Continue current medications, advised the patient to monitor her blood pressure at home and bring the diary with her in 4 weeks.  # CKD:  Advised the patient to follow-up with nephrology.  Follow back with patient in 4  weeks."  In past had referred pt to Dr. Krazowski for below.  "Pt had intermittent pedal  edema. Some days prominent other days none. I think likely dependant edema. Pt daughter who had mi has chf. Pt concerned she may get chf. She does have htn. Recent difficult getting controlled and med changes when her kidney function numbers decreased. Numbers kidney number improved. Her bnp is 417. No cardiomegaly. She request to see Cardiologist. Wanted pt to see Dr. Krazowski.Can she see in within 3-4 weeks."   Pt legs are still swollen and has pain in both legs.     Olivia Morales is a 73 year old female with congestive heart failure who presents with leg, ankle, and foot swelling.  She has experienced significant swelling in her legs, ankles, and feet, which began after a flight. The swelling was severe enough to prevent her from wearing her usual shoe size.  She visited the emergency room in [place]  and was administered Lasix and potassium. Currently, she is on Lasix 40 mg daily, potassium 10 mEq twice daily, Jardiance 10 mg, and ferrous sulfate 325 mg daily.  She has a history mild- moderate BNP level of 417 in early March.. An echocardiogram on April 10 showed an ejection fraction of 60-65%. The swelling in lower ext in [place]  was severe before she was evaluated and treated/started on daily lasix.Aaron Aas Pt thinks in [place]  she did not get chest xray. We have care everywhere but I don't see cxr report done in [place] ? Pt states they did [place]  of lower ext. on dc summary no report of any dvt.  Last 4 chest xray that I can see showed no acute cardiopulmonary process. No chf type findings on those cxrs.  Her blood pressure has been variable, with recent readings of 120/74 at cardilogist visit in  and 156/78 later on other specialist visit. She is currently taking hydralazine  75 mg three times a day and amlodipine  10 mg daily.  In past we have relied on phone calls to relay messages/lab results.  She mentions difficulty with communication through 'My Chart' though it appears my chart is active. I think pt  family does not live local. I think pt family updated on her health condition when she was hospitalized most recently. Pt describes difficulty working with computers. I had suggested in past to give family access to my chart in order to help coordinate care. Particular example would be attempts to successfully refer to nephrologst.   She denies receiving a chest x-ray during her recent hospitalization in [place] . She also reports that her blood pressure machine is 71 years old and may not be functioning properly.  Review of Systems  Constitutional:  Negative for chills and fever.  Respiratory:  Positive for shortness of breath. Negative for cough, chest tightness and wheezing.   Cardiovascular:  Negative for chest pain and palpitations.  Gastrointestinal:  Negative for abdominal pain.  Genitourinary:  Negative for dysuria and frequency.  Musculoskeletal:        Lower ext pain.  Neurological:  Negative for dizziness, speech difficulty, weakness and light-headedness.  Hematological:  Negative for adenopathy. Does not bruise/bleed easily.  Psychiatric/Behavioral:  Negative for behavioral problems and decreased concentration.     Past Medical History:  Diagnosis Date  . Diabetes mellitus   . Hypercholesteremia   . Hypertension   . Sickle cell trait (HCC)   . Sleep apnea      Social History   Socioeconomic History  . Marital status: Widowed  Spouse name: Not on file  . Number of children: Not on file  . Years of education: Not on file  . Highest education level: Not on file  Occupational History  . Not on file  Tobacco Use  . Smoking status: Never  . Smokeless tobacco: Never  Vaping Use  . Vaping status: Never Used  Substance and Sexual Activity  . Alcohol use: Yes    Comment: weekly  . Drug use: No  . Sexual activity: Not on file  Other Topics Concern  . Not on file  Social History Narrative  . Not on file   Social Drivers of Health   Financial Resource Strain: Low  Risk  (06/05/2021)   Overall Financial Resource Strain (CARDIA)   . Difficulty of Paying Living Expenses: Not hard at all  Food Insecurity: No Food Insecurity (08/23/2022)   Hunger Vital Sign   . Worried About Programme researcher, broadcasting/film/video in the Last Year: Never true   . Ran Out of Food in the Last Year: Never true  Transportation Needs: No Transportation Needs (08/23/2022)   PRAPARE - Transportation   . Lack of Transportation (Medical): No   . Lack of Transportation (Non-Medical): No  Physical Activity: Inactive (06/05/2021)   Exercise Vital Sign   . Days of Exercise per Week: 0 days   . Minutes of Exercise per Session: 0 min  Stress: No Stress Concern Present (06/05/2021)   [employer] of Occupational Health - Occupational Stress Questionnaire   . Feeling of Stress : Not at all  Social Connections: Moderately Integrated (06/05/2021)   Social Connection and Isolation Panel [NHANES]   . Frequency of Communication with Friends and Family: More than three times a week   . Frequency of Social Gatherings with Friends and Family: More than three times a week   . Attends Religious Services: More than 4 times per year   . Active Member of Clubs or Organizations: Yes   . Attends Banker Meetings: More than 4 times per year   . Marital Status: Widowed  Intimate Partner Violence: Not At Risk (08/23/2022)   Humiliation, Afraid, Rape, and Kick questionnaire   . Fear of Current or Ex-Partner: No   . Emotionally Abused: No   . Physically Abused: No   . Sexually Abused: No    Past Surgical History:  Procedure Laterality Date  . TUBAL LIGATION      Family History  Problem Relation Age of Onset  . Sickle cell anemia Father     Allergies  Allergen Reactions  . Ace Inhibitors     Cough    Current Outpatient Medications on File Prior to Visit  Medication Sig Dispense Refill  . amLODipine  (NORVASC ) 10 MG tablet TAKE 1 TABLET BY MOUTH DAILY 100 tablet 2  . atorvastatin  (LIPITOR) 40  MG tablet TAKE 1 TABLET BY MOUTH DAILY 100 tablet 2  . benzonatate  (TESSALON ) 100 MG capsule Take 1 capsule (100 mg total) by mouth 3 (three) times daily as needed for cough. 30 capsule 0  . Blood Glucose Monitoring Suppl (BLOOD GLUCOSE MONITOR SYSTEM) w/Device KIT Use as directed in the morning, at noon, and at bedtime. 1 kit 0  . Cyanocobalamin  (VITAMIN B-12 PO) Take 1 tablet by mouth once a week.    . empagliflozin (JARDIANCE) 10 MG TABS tablet Take 10 mg by mouth daily.    . famotidine  (PEPCID ) 10 MG tablet Take 10 mg by mouth daily.    . ferrous sulfate 325 (  65 FE) MG tablet Take 325 mg by mouth daily with breakfast.    . furosemide (LASIX) 40 MG tablet Take 40 mg by mouth daily.    . hydrALAZINE  (APRESOLINE ) 25 MG tablet Take 3 tablets (75 mg total) by mouth 3 (three) times daily. 270 tablet 11  . Lancets (ONETOUCH DELICA PLUS LANCET33G) MISC USE TO TEST 1-2 TIMES DAILY 100 each 0  . metoprolol  succinate (TOPROL -XL) 50 MG 24 hr tablet TAKE 1 TABLET BY MOUTH DAILY  WITH OR IMMEDIATELY FOLLOWING A  MEAL (Patient taking differently: Take 50 mg by mouth daily.) 100 tablet 2  . ONETOUCH VERIO test strip Check blood sugar 1-2 times daily 100 each 12  . potassium chloride  (MICRO-K ) 10 MEQ CR capsule Take 10 mEq by mouth 2 (two) times daily.    . TRULICITY  0.75 MG/0.5ML SOAJ INJECT THE CONTENTS OF ONE PEN  SUBCUTANEOUSLY WEEKLY AS  DIRECTED 6 mL 3  . Vitamin D , Ergocalciferol , (DRISDOL ) 1.25 MG (50000 UNIT) CAPS capsule Take 1 capsule (50,000 Units total) by mouth every 7 (seven) days. 8 capsule 0   No current facility-administered medications on file prior to visit.    BP (!) 170/80   Pulse (!) 56   Temp 98.2 F (36.8 C) (Oral)   Resp 12   Ht 5\' 4"  (1.626 m)   Wt 154 lb 12.8 oz (70.2 kg)   SpO2 100%   BMI 26.57 kg/m        Objective:   Physical Exam   General Mental Status- Alert. General Appearance- Not in acute distress.     Neck  No JVD.  Chest and Lung  Exam Auscultation: Breath Sounds:-Normal.  Cardiovascular Auscultation:Rythm- Regular. Murmurs & Other Heart Sounds:Auscultation of the heart reveals- No Murmurs.  Abdomen Inspection:-Inspeection Normal. Palpation/Percussion:Note:No mass. Palpation and Percussion of the abdomen reveal- Non Tender, Non Distended + BS, no rebound or guarding.   Neurologic Cranial Nerve exam:- CN III-XII intact(No nystagmus), symmetric smile. Strength:- 5/5 equal and symmetric strength both upper and lower extremities.    Lower ext- bilateral symetric 2-3+ pedal edema. Calfs symmetric. Negative homans signs.    Assessment & Plan:   Patient Instructions  Congestive Heart Failure (CHF) Recent CHF exacerbation with lower extremity edema. Ejection fraction 60-65%. BNP moderately elevated at 417. Worse levels in [place]  when hospitalized - Order stat BNP, metabolic panel, chest x-ray, and potassium level. - Follow up with cardiologist, Dr. Krazowski, on Friday. - Consider wedge pillows for CHF management, pending insurance coverage.  Hypertension Hypertension with inadequate control, recent readings 170/90 and 170/80. - Increase hydralazine  to 25 mg, four tablets, three times a day. - Continue amlodipine  10 mg daily. - Recommend purchasing a new blood pressure monitor with fresh batteries. - Check blood pressure after three doses of increased hydralazine  and update provider with readings. -update me on bp reading tomorrow afternoon.  Diabetes Mellitus Diabetes mellitus with adequate glycemic control, recent A1c 5.8%. - Repeat A1c test.  Anemia Anemia under evaluation, currently on ferrous sulfate 325 mg daily. - Check CBC and iron panel.  Gout Gout under evaluation, advised against NSAIDs due to potential adverse effects. - Check uric acid level. - Avoid NSAIDs, including naproxen.  CKD- Nephrologist has call you on 09-17-2023. you did not get the message. Will ask staff to call again tomorrow  and coordinate appointment asking them to see you within 1-2 weeks.   Follow with me next monday or tuesday    Sylvia Everts, PA-C   Time spent  with patient today was  55 minutes which consisted of chart revdiew, discussing diagnosis, work up treatment and documentation.

## 2023-10-29 ENCOUNTER — Other Ambulatory Visit (INDEPENDENT_AMBULATORY_CARE_PROVIDER_SITE_OTHER)

## 2023-10-29 ENCOUNTER — Telehealth: Payer: Self-pay | Admitting: Medical

## 2023-10-29 DIAGNOSIS — N189 Chronic kidney disease, unspecified: Secondary | ICD-10-CM

## 2023-10-29 DIAGNOSIS — M1000 Idiopathic gout, unspecified site: Secondary | ICD-10-CM | POA: Diagnosis not present

## 2023-10-29 DIAGNOSIS — D649 Anemia, unspecified: Secondary | ICD-10-CM

## 2023-10-29 DIAGNOSIS — E1165 Type 2 diabetes mellitus with hyperglycemia: Secondary | ICD-10-CM

## 2023-10-29 LAB — CBC WITH DIFFERENTIAL/PLATELET
Absolute Lymphocytes: 1182 {cells}/uL (ref 850–3900)
Absolute Monocytes: 339 {cells}/uL (ref 200–950)
Basophils Absolute: 51 {cells}/uL (ref 0–200)
Basophils Relative: 1.7 %
Eosinophils Absolute: 99 {cells}/uL (ref 15–500)
Eosinophils Relative: 3.3 %
HCT: 31.7 % — ABNORMAL LOW (ref 35.0–45.0)
Hemoglobin: 9.9 g/dL — ABNORMAL LOW (ref 11.7–15.5)
MCH: 27 pg (ref 27.0–33.0)
MCHC: 31.2 g/dL — ABNORMAL LOW (ref 32.0–36.0)
MCV: 86.6 fL (ref 80.0–100.0)
MPV: 10.4 fL (ref 7.5–12.5)
Monocytes Relative: 11.3 %
Neutro Abs: 1329 {cells}/uL — ABNORMAL LOW (ref 1500–7800)
Neutrophils Relative %: 44.3 %
Platelets: 314 10*3/uL (ref 140–400)
RBC: 3.66 10*6/uL — ABNORMAL LOW (ref 3.80–5.10)
RDW: 13.5 % (ref 11.0–15.0)
Total Lymphocyte: 39.4 %
WBC: 3 10*3/uL — ABNORMAL LOW (ref 3.8–10.8)

## 2023-10-29 LAB — HEMOGLOBIN A1C: Hgb A1c MFr Bld: 5.4 % (ref 4.6–6.5)

## 2023-10-29 LAB — IBC + FERRITIN
Ferritin: 456.5 ng/mL — ABNORMAL HIGH (ref 10.0–291.0)
Iron: 78 ug/dL (ref 42–145)
Saturation Ratios: 31 % (ref 20.0–50.0)
TIBC: 252 ug/dL (ref 250.0–450.0)
Transferrin: 180 mg/dL — ABNORMAL LOW (ref 212.0–360.0)

## 2023-10-29 LAB — TIQ- MISLABELED: Test Ordered On Req: 561610231905

## 2023-10-29 LAB — COMPREHENSIVE METABOLIC PANEL WITH GFR
ALT: 9 U/L (ref 0–35)
AST: 16 U/L (ref 0–37)
Albumin: 3.7 g/dL (ref 3.5–5.2)
Alkaline Phosphatase: 65 U/L (ref 39–117)
BUN: 20 mg/dL (ref 6–23)
CO2: 28 meq/L (ref 19–32)
Calcium: 9.2 mg/dL (ref 8.4–10.5)
Chloride: 105 meq/L (ref 96–112)
Creatinine, Ser: 2.1 mg/dL — ABNORMAL HIGH (ref 0.40–1.20)
GFR: 23.32 mL/min — ABNORMAL LOW (ref >60.00)
Glucose, Bld: 140 mg/dL — ABNORMAL HIGH (ref 70–99)
Potassium: 3.8 meq/L (ref 3.5–5.1)
Sodium: 140 meq/L (ref 135–145)
Total Bilirubin: 0.5 mg/dL (ref 0.2–1.2)
Total Protein: 6.4 g/dL (ref 6.0–8.3)

## 2023-10-29 LAB — URIC ACID: Uric Acid, Serum: 7.9 mg/dL — ABNORMAL HIGH (ref 2.4–7.0)

## 2023-10-29 NOTE — Addendum Note (Signed)
 Addended by: Serafina Damme on: 10/29/2023 09:12 AM   Modules accepted: Orders

## 2023-10-29 NOTE — Addendum Note (Signed)
 Addended by: Susa Engman A on: 10/29/2023 09:32 AM   Modules accepted: Orders

## 2023-10-30 ENCOUNTER — Other Ambulatory Visit (HOSPITAL_BASED_OUTPATIENT_CLINIC_OR_DEPARTMENT_OTHER): Payer: Self-pay

## 2023-10-30 ENCOUNTER — Encounter: Payer: Self-pay | Admitting: *Deleted

## 2023-10-30 LAB — IRON,TIBC AND FERRITIN PANEL
%SAT: 35 % (ref 16–45)
Ferritin: 549 ng/mL — ABNORMAL HIGH (ref 16–288)
Iron: 91 ug/dL (ref 45–160)
TIBC: 258 ug/dL (ref 250–450)

## 2023-10-30 MED ORDER — METHYLPREDNISOLONE 4 MG PO TABS
ORAL_TABLET | ORAL | 0 refills | Status: DC
  Filled 2023-10-30: qty 6, 3d supply, fill #0

## 2023-10-30 NOTE — Addendum Note (Signed)
 Addended by: Serafina Damme on: 10/30/2023 05:51 AM   Modules accepted: Orders

## 2023-10-30 NOTE — Addendum Note (Signed)
 Addended by: Serafina Damme on: 10/30/2023 08:35 PM   Modules accepted: Orders

## 2023-10-30 NOTE — Telephone Encounter (Signed)
 I have placed rheumatology referral for pt back in February. On review of that looks like they reviewed but no call to pt. Can you follow up on that. Also give patient direct number to call their office.

## 2023-11-01 ENCOUNTER — Other Ambulatory Visit (HOSPITAL_BASED_OUTPATIENT_CLINIC_OR_DEPARTMENT_OTHER): Payer: Self-pay

## 2023-11-01 ENCOUNTER — Ambulatory Visit: Attending: Cardiology | Admitting: Cardiology

## 2023-11-01 ENCOUNTER — Encounter: Payer: Self-pay | Admitting: Cardiology

## 2023-11-01 ENCOUNTER — Ambulatory Visit: Admitting: Cardiology

## 2023-11-01 VITALS — BP 142/90 | HR 54 | Ht 64.0 in | Wt 147.4 lb

## 2023-11-01 DIAGNOSIS — I5032 Chronic diastolic (congestive) heart failure: Secondary | ICD-10-CM | POA: Diagnosis not present

## 2023-11-01 DIAGNOSIS — I350 Nonrheumatic aortic (valve) stenosis: Secondary | ICD-10-CM | POA: Diagnosis not present

## 2023-11-01 DIAGNOSIS — I10 Essential (primary) hypertension: Secondary | ICD-10-CM

## 2023-11-01 DIAGNOSIS — I152 Hypertension secondary to endocrine disorders: Secondary | ICD-10-CM

## 2023-11-01 DIAGNOSIS — E1159 Type 2 diabetes mellitus with other circulatory complications: Secondary | ICD-10-CM

## 2023-11-01 MED ORDER — CLONIDINE HCL 0.1 MG PO TABS: 0.1000 mg | ORAL_TABLET | Freq: Once | ORAL | 3 refills | Status: DC

## 2023-11-01 MED ORDER — CLONIDINE HCL 0.1 MG PO TABS
0.1000 mg | ORAL_TABLET | Freq: Once | ORAL | 0 refills | Status: DC
  Filled 2023-11-01: qty 7, 7d supply, fill #0

## 2023-11-01 NOTE — Addendum Note (Signed)
 Addended by: Shawnee Dellen D on: 11/01/2023 03:16 PM   Modules accepted: Orders

## 2023-11-01 NOTE — Progress Notes (Signed)
 Cardiology Office Note:    Date:  11/01/2023   ID:  Erinne Gladish, DOB 1951-03-01, MRN [id number]  PCP:  Sylvia Everts, PA-C  Cardiologist:  Ralene Burger, MD    Referring MD: Sylvia Everts, [place]   Chief Complaint  Patient presents with   Hospitalization Follow-up    History of Present Illness:    Olivia Morales is a 73 y.o. female past medical history significant for diastolic congestive heart failure, diabetes mellitus, chronic kidney failure, sleep apnea, essential hypertension.  Recently she ended up going to [place]  she ended up having swollen legs and not being in the emergency room.  She was given Lasix and potassium with good response sadly her kidney function deteriorated her creatinine is 2.1 overall she is feeling better still gets some swelling is very happy and satisfied where she was treated in [place]  in the emergency room.  Denies have any chest pain tightness squeezing pressure burning chest  Past Medical History:  Diagnosis Date   Diabetes mellitus    Hypercholesteremia    Hypertension    Sickle cell trait (HCC)    Sleep apnea     Past Surgical History:  Procedure Laterality Date   TUBAL LIGATION      Current Medications: Current Meds  Medication Sig   alendronate  (FOSAMAX ) 10 MG tablet Take 10 mg by mouth daily before breakfast. Take with a full glass of water on an empty stomach.   amLODipine  (NORVASC ) 10 MG tablet TAKE 1 TABLET BY MOUTH DAILY   aspirin EC 81 MG tablet Take 81 mg by mouth daily. Swallow whole.   atorvastatin  (LIPITOR) 40 MG tablet TAKE 1 TABLET BY MOUTH DAILY   Cyanocobalamin  (VITAMIN B-12 PO) Take 1 tablet by mouth once a week.   empagliflozin (JARDIANCE) 10 MG TABS tablet Take 10 mg by mouth daily.   ferrous sulfate 325 (65 FE) MG tablet Take 325 mg by mouth daily with breakfast.   furosemide (LASIX) 40 MG tablet Take 40 mg by mouth daily.   hydrALAZINE  (APRESOLINE ) 25 MG tablet Take 3 tablets (75 mg total) by mouth 3  (three) times daily. (Patient taking differently: Take 100 mg by mouth 3 (three) times daily.)   Lancets (ONETOUCH DELICA PLUS LANCET33G) MISC USE TO TEST 1-2 TIMES DAILY (Patient taking differently: 1 each by Other route daily.)   metoprolol  succinate (TOPROL -XL) 50 MG 24 hr tablet TAKE 1 TABLET BY MOUTH DAILY  WITH OR IMMEDIATELY FOLLOWING A  MEAL (Patient taking differently: Take 50 mg by mouth daily.)   omeprazole  (PRILOSEC) 20 MG capsule Take 20 mg by mouth daily.   ONETOUCH VERIO test strip Check blood sugar 1-2 times daily (Patient taking differently: 1 each by Other route See admin instructions. Check blood sugar 1-2 times daily)   potassium chloride  (MICRO-K ) 10 MEQ CR capsule Take 10 mEq by mouth 2 (two) times daily.   traMADol  (ULTRAM ) 50 MG tablet Take 1 tablet (50 mg total) by mouth every 8 (eight) hours as needed for pain. (Patient taking differently: Take 50 mg by mouth every 8 (eight) hours as needed for moderate pain (pain score 4-6) or severe pain (pain score 7-10).)   [DISCONTINUED] benzonatate  (TESSALON ) 100 MG capsule Take 1 capsule (100 mg total) by mouth 3 (three) times daily as needed for cough.   [DISCONTINUED] Blood Glucose Monitoring Suppl (BLOOD GLUCOSE MONITOR SYSTEM) w/Device KIT Use as directed in the morning, at noon, and at bedtime.   [DISCONTINUED] famotidine  (PEPCID ) 10 MG tablet Take 10 mg  by mouth daily.   [DISCONTINUED] methylPREDNISolone  (MEDROL ) 4 MG tablet Take 3 tablets (12 mg total) by mouth daily for 1 day, THEN 2 tablets (8 mg total) daily for 1 day, THEN 1 tablet (4 mg total) daily for 1 day.   [DISCONTINUED] TRULICITY  0.75 MG/0.5ML SOAJ INJECT THE CONTENTS OF ONE PEN  SUBCUTANEOUSLY WEEKLY AS  DIRECTED (Patient taking differently: Inject 0.75 mg into the skin once a week.)   [DISCONTINUED] Vitamin D , Ergocalciferol , (DRISDOL ) 1.25 MG (50000 UNIT) CAPS capsule Take 1 capsule (50,000 Units total) by mouth every 7 (seven) days.     Allergies:   Ace  inhibitors   Social History   Socioeconomic History   Marital status: Widowed    Spouse name: Not on file   Number of children: Not on file   Years of education: Not on file   Highest education level: Not on file  Occupational History   Not on file  Tobacco Use   Smoking status: Never   Smokeless tobacco: Never  Vaping Use   Vaping status: Never Used  Substance and Sexual Activity   Alcohol use: Yes    Comment: weekly   Drug use: No   Sexual activity: Not on file  Other Topics Concern   Not on file  Social History Narrative   Not on file   Social Drivers of Health   Financial Resource Strain: Low Risk  (06/05/2021)   Overall Financial Resource Strain (CARDIA)    Difficulty of Paying Living Expenses: Not hard at all  Food Insecurity: No Food Insecurity (08/23/2022)   Hunger Vital Sign    Worried About Running Out of Food in the Last Year: Never true    Ran Out of Food in the Last Year: Never true  Transportation Needs: No Transportation Needs (08/23/2022)   PRAPARE - Administrator, Civil Service (Medical): No    Lack of Transportation (Non-Medical): No  Physical Activity: Inactive (06/05/2021)   Exercise Vital Sign    Days of Exercise per Week: 0 days    Minutes of Exercise per Session: 0 min  Stress: No Stress Concern Present (06/05/2021)   [employer] of Occupational Health - Occupational Stress Questionnaire    Feeling of Stress : Not at all  Social Connections: Moderately Integrated (06/05/2021)   Social Connection and Isolation Panel [NHANES]    Frequency of Communication with Friends and Family: More than three times a week    Frequency of Social Gatherings with Friends and Family: More than three times a week    Attends Religious Services: More than 4 times per year    Active Member of [employer] or Organizations: Yes    Attends Banker Meetings: More than 4 times per year    Marital Status: Widowed     Family History: The patient's  family history includes Sickle cell anemia in her father. ROS:   Please see the history of present illness.    All 14 point review of systems negative except as described per history of present illness  EKGs/Labs/Other Studies Reviewed:         Recent Labs: 08/13/2023: TSH 1.95 09/03/2023: Pro B Natriuretic peptide (BNP) 417.0 10/28/2023: Brain Natriuretic Peptide 123; Hemoglobin 9.9; Platelets 314 10/29/2023: ALT 9; BUN 20; Creatinine, Ser 2.10; Potassium 3.8; Sodium 140  Recent Lipid Panel    Component Value Date/Time   CHOL 152 08/02/2021 0800   TRIG 76.0 08/02/2021 0800   HDL 46.60 08/02/2021 0800   CHOLHDL  3 08/02/2021 0800   VLDL 15.2 08/02/2021 0800   LDLCALC 90 08/02/2021 0800   LDLCALC 98 03/03/2021 1557    Physical Exam:    VS:  BP (!) 142/90 (BP Location: Right Arm, Patient Position: Sitting)   Pulse (!) 54   Ht 5\' 4"  (1.626 m)   Wt 147 lb 6.4 oz (66.9 kg)   SpO2 97%   BMI 25.30 kg/m     Wt Readings from Last 3 Encounters:  11/01/23 147 lb 6.4 oz (66.9 kg)  10/28/23 154 lb 12.8 oz (70.2 kg)  10/14/23 153 lb (69.4 kg)     GEN:  Well nourished, well developed in no acute distress HEENT: Normal NECK: No JVD; No carotid bruits LYMPHATICS: No lymphadenopathy CARDIAC: RRR, no murmurs, no rubs, no gallops RESPIRATORY:  Clear to auscultation without rales, wheezing or rhonchi  ABDOMEN: Soft, non-tender, non-distended MUSCULOSKELETAL:  No edema; No deformity  SKIN: Warm and dry LOWER EXTREMITIES: 1+ swelling NEUROLOGIC:  Alert and oriented x 3 PSYCHIATRIC:  Normal affect   ASSESSMENT:    1. Chronic diastolic heart failure (HCC)   2. Hypertension associated with diabetes (HCC)   3. Benign essential hypertension   4. Nonrheumatic aortic valve stenosis    PLAN:    In order of problems listed above:  Chronic diastolic congestive heart failure now with dealing with the situation when her kidney function deteriorated after diuretics on top of that swelling is  still present I will discontinue her amlodipine , I will put her on clonidine  0.1 daily, will check her kidney function at the beginning of next week.  She will be referred to nephrologist. Benign essential hypertension blood pressure better but still slightly on the higher side hopefully will be able to reduce this with amlodipine  discontinuation and switching to clonidine  0.1.  I warned her about potential side effect of clonidine  and asked her to let me know if any of these happen. Nonrheumatic aortic valve stenosis none recorded on last echocardiogram    Medication Adjustments/Labs and Tests Ordered: Current medicines are reviewed at length with the patient today.  Concerns regarding medicines are outlined above.  No orders of the defined types were placed in this encounter.  Medication changes: No orders of the defined types were placed in this encounter.   Signed, Manfred Seed, MD, [facility] 11/01/2023 2:53 PM    [facility]

## 2023-11-01 NOTE — Patient Instructions (Signed)
 Medication Instructions:   STOP: Amlodipine   START: Clonidine  0.1mg  1 daily    Lab Work:  [facility] [address]  [place], [place] [address]  3rd Floor   [address]   Your physician recommends that you return for lab work in: Tuesday or Wednesday You need to have labs done when you are fasting.  You can come Monday through Friday 8:00 am to 11:30AM and 1:00 to 4:00. You do not need to make an appointment as the order has already been placed   Testing/Procedures: None Ordered   Follow-Up: At [facility], you and your health needs are our priority.  As part of our continuing mission to provide you with exceptional heart care, we have created designated Provider Care Teams.  These Care Teams include your primary Cardiologist (physician) and Advanced Practice Providers (APPs -  Physician Assistants and Nurse Practitioners) who all work together to provide you with the care you need, when you need it.  We recommend signing up for the patient portal called "MyChart".  Sign up information is provided on this After Visit Summary.  MyChart is used to connect with patients for Virtual Visits (Telemedicine).  Patients are able to view lab/test results, encounter notes, upcoming appointments, etc.  Non-urgent messages can be sent to your provider as well.   To learn more about what you can do with MyChart, go to [email or web address].    Your next appointment:   1 month(s)  The format for your next appointment:   In Person  Provider:   Ralene Burger, MD    Other Instructions NA

## 2023-11-04 ENCOUNTER — Other Ambulatory Visit (HOSPITAL_BASED_OUTPATIENT_CLINIC_OR_DEPARTMENT_OTHER): Payer: Self-pay

## 2023-11-04 ENCOUNTER — Ambulatory Visit: Admitting: Medical

## 2023-11-04 ENCOUNTER — Encounter: Payer: Self-pay | Admitting: Medical

## 2023-11-04 VITALS — BP 132/60 | HR 51 | Resp 18 | Ht 64.0 in | Wt 144.0 lb

## 2023-11-04 DIAGNOSIS — I10 Essential (primary) hypertension: Secondary | ICD-10-CM

## 2023-11-04 DIAGNOSIS — N189 Chronic kidney disease, unspecified: Secondary | ICD-10-CM

## 2023-11-04 DIAGNOSIS — D649 Anemia, unspecified: Secondary | ICD-10-CM | POA: Diagnosis not present

## 2023-11-04 DIAGNOSIS — R600 Localized edema: Secondary | ICD-10-CM

## 2023-11-04 DIAGNOSIS — I509 Heart failure, unspecified: Secondary | ICD-10-CM | POA: Diagnosis not present

## 2023-11-04 LAB — CBC WITH DIFFERENTIAL/PLATELET
Basophils Absolute: 0 10*3/uL (ref 0.0–0.1)
Basophils Relative: 1.8 % (ref 0.0–3.0)
Eosinophils Absolute: 0 10*3/uL (ref 0.0–0.7)
Eosinophils Relative: 1.7 % (ref 0.0–5.0)
HCT: 28.6 % — ABNORMAL LOW (ref 36.0–46.0)
Hemoglobin: 9.4 g/dL — ABNORMAL LOW (ref 12.0–15.0)
Lymphocytes Relative: 26.9 % (ref 12.0–46.0)
Lymphs Abs: 0.7 10*3/uL (ref 0.7–4.0)
MCHC: 32.8 g/dL (ref 30.0–36.0)
MCV: 85.5 fl (ref 78.0–100.0)
Monocytes Absolute: 0.3 10*3/uL (ref 0.1–1.0)
Monocytes Relative: 13 % — ABNORMAL HIGH (ref 3.0–12.0)
Neutro Abs: 1.4 10*3/uL (ref 1.4–7.7)
Neutrophils Relative %: 56.6 % (ref 43.0–77.0)
Platelets: 243 10*3/uL (ref 150.0–400.0)
RBC: 3.34 Mil/uL — ABNORMAL LOW (ref 3.87–5.11)
RDW: 14.4 % (ref 11.5–15.5)
WBC: 2.5 10*3/uL — ABNORMAL LOW (ref 4.0–10.5)

## 2023-11-04 LAB — COMPREHENSIVE METABOLIC PANEL WITH GFR
ALT: 8 U/L (ref 0–35)
AST: 15 U/L (ref 0–37)
Albumin: 3.7 g/dL (ref 3.5–5.2)
Alkaline Phosphatase: 60 U/L (ref 39–117)
BUN: 22 mg/dL (ref 6–23)
CO2: 24 meq/L (ref 19–32)
Calcium: 9.6 mg/dL (ref 8.4–10.5)
Chloride: 106 meq/L (ref 96–112)
Creatinine, Ser: 2.2 mg/dL — ABNORMAL HIGH (ref 0.40–1.20)
GFR: 22.05 mL/min — ABNORMAL LOW (ref >60.00)
Glucose, Bld: 104 mg/dL — ABNORMAL HIGH (ref 70–99)
Potassium: 4.2 meq/L (ref 3.5–5.1)
Sodium: 138 meq/L (ref 135–145)
Total Bilirubin: 0.4 mg/dL (ref 0.2–1.2)
Total Protein: 6.1 g/dL (ref 6.0–8.3)

## 2023-11-04 LAB — BRAIN NATRIURETIC PEPTIDE: Pro B Natriuretic peptide (BNP): 395 pg/mL — ABNORMAL HIGH (ref 0.0–100.0)

## 2023-11-04 NOTE — Patient Instructions (Addendum)
 Congestive Heart Failure. Clinically well controlled. Managed with furosemide, Keeping in mind chronic kidney disease. No swelling or dyspnea reported. Delicate balance benefit vs risk discussed with pt. - Take furosemide 20 mg half  40 mg tablet daily. - Repeat BNP to confirm it remains low.  Hypertension Blood pressure controlled with current regimen. Clonidine  continued, amlodipine  discontinued per cardiologist. - Continue clonidine  0.1 mg once daily per cardiology - Discontinue amlodipine  as per cardiologist's instructions. - Continue hydralazine  100 mg three times daily.  Chronic Kidney Disease Monitored with labs. Hydration encouraged. Nephrology referral pending.(various attempts to refer. Still pending. - Repeat metabolic panel to assess kidney function. - Encourage hydration to improve kidney function. - Continue efforts to secure nephrology referral.  Anemia Referral to nephologist place with hematology referral due to elevated ferritin and potential kidney function relation. Sickle cell trait noted. Just recently placed on iron by MD in [place] . Advised not to use iron(ferrous sulfate) since ferritin level high - Refer to hematologist for evaluation of anemia and elevated ferritin. - Repeat CBC to monitor anemia.  Follow up date to be determined after lab review.   I did call [facility] this morning on 11-07-2023. Have been trying for years for her establish care with nephrologist on review she did establish care briefly I think in 2022 but then did not keep follow up care with that office. Recently with decrease gfr and increase cr tried to get her in with new nephrologist but referral was delayed? I called nephrology office today early am and followed up on prior referral months ago. Receptionist confirmed that they did try to call pt and left message on 09-18-2023(pt did not call back). I thought I saw that on review about a week ago as well. Asking nephrologist office to call  pt today. Gave 2 contact numbers. Will get staff to fax over most recent labs. Did explain to nephrologist that recent chf and use of lasix has effected cr and gfr. Will also ask staff to call pt and have her call [facility] directly as well.  11-07-2023 12:02 Tried to call pt mobile and phone number to confirm hopefully that nephrologist office called her and to advise for her to contact them or if unable to contact office then to go there directly as thus far no appointment but very imporrant.  About 5 minutes later did try another time and she picked up.  Had discussion with patient and she did confirm that nephrology office did call her this morning but her boyfriend picked up the phone and accidentally hit mute and they never talked to the office directly.  She states that she will call them now and try to get scheduled.  Asked patient to call our office and confirm whether or not she got through and if appointment was made.  Advised patient regarding recent labs and explained stable decreased kidney function(same as before) numbers and that the BNP was in the 300 range.  Explained that she should use half of the furosemide 40 mg tablet every other day(explained importance of cutting back).  Needed to verify that they got the message as on review it looks like staff was never able to talk to patient directly but we did mail the lab results with result note instruction.  Asking patient also to call our office and get scheduled for follow-up next Thursday with other provider in office to recheck legs, the repeat metabolic panel/kidney function and to listen to lungs.  Assess if any CHF  flare occurring.  Patient is about to head out of town to attend relatives.  Also will refill patient's tramadol  as she request refill.  Tends to have moderate to severe lower extremity pain at times associated with increased pedal edema.  Ace wrap compressions have helped and she occasionally uses tramadol  as  well.  11-13-2023 Called pt after reviewing hematologist notes. Saw that on review of that note  hematology provider stated pt was on nsaids. That has not been the case since I advised to stop(advised not to use) months ago after seeing podiatrist prescription nsaids. Called to verify not using any nsaids. Pt agrees she has not been using. Also thankfully confirmed with pt she has appointment with nephrologist next week. Advised to use lasix now 20 mg every 3rd day pending nephrologist opinion. Call to make follow up appointment with me on 11/26/2023.

## 2023-11-04 NOTE — Progress Notes (Signed)
 Subjective:    Patient ID: Olivia Morales, female    DOB: 12/30/1950, 73 y.o.   MRN: [id number]  HPI Pt in for follow post cardiology appointment last week. Pt saw Dr. Krasowski.  A/P from Dr. Krasowski.   ASSESSMENT:     1. Chronic diastolic heart failure (HCC)   2. Hypertension associated with diabetes (HCC)   3. Benign essential hypertension   4. Nonrheumatic aortic valve stenosis     PLAN:     In order of problems listed above:   Chronic diastolic congestive heart failure now with dealing with the situation when her kidney function deteriorated after diuretics on top of that swelling is still present I will discontinue her amlodipine , I will put her on clonidine  0.1 daily, will check her kidney function at the beginning of next week.  She will be referred to nephrologist. Benign essential hypertension blood pressure better but still slightly on the higher side hopefully will be able to reduce this with amlodipine  discontinuation and switching to clonidine  0.1.  I warned her about potential side effect of clonidine  and asked her to let me know if any of these happen. Nonrheumatic aortic valve stenosis none recorded on last echocardiogram     Regarding pt ckd I have place variou referral to nephrologist. Did get her in with  [facility]  Assessment and Plan:   Problem List Items Addressed This Visit   1. Stage III chronic kidney disease. -She appears to be clinically stable. Laboratory studies obtained at the time of her initial appointment on February 14, 2021, revealed the serum creatinine level to be 1.79 mg/dL with BUN of 26 mg/dL and estimated GFR of 31 mL/min. Subsequently, the serum creatinine level was noted to be lower at 1.53 mg/dL, along with BUN of 24 mg/dL and estimated GFR of 37 mm/min on March 03, 2021. She does have some degree of proteinuria. The urine protein/creatinine ratio was 0.714/g creatinine in August 2022. 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 Management to focus on optimal blood pressure control, avoidance of nephrotoxic agents, maintaining good glycemic control as well as avoidance of nephrotoxic agents particularly NSAIDs. Will recheck renal function studies today. We will also check urinalysis and random urine protein/creatinine ratio as well.  2. Hypertension. -Blood pressure reading today is acceptable continue current antihypertensive regimen including amlodipine  10 mg once a day, hydrochlorothiazide  12.5 mg once a day, losartan  25 mg once a day, metoprolol  XL 50 mg once a day.  3. Type 2 diabetes mellitus. -Continue current management including Trulicity  0.75 mg once a week.  4. Anemia. -Hemoglobin was 10.6 on February 14, 2021. Continue to monitor.  5. Morbid obesity. -Weight loss is advised.  6. Hyperlipidemia. -Continue to monitor lipid panel. She remains on atorvastatin  40 mg once a day.  7. CKD mineral bone disorder with hypercalcemia. -In August 2022, the PTH was 116, along with calcium  of 10.3, phosphorus 3.7 and 25-hydroxy vitamin D  of 36. Of note on March 03, 2021, the calcium  was slightly elevated at 11. No symptoms related to elevated serum calcium  level. She is encouraged to ensure adequate fluid intake. We will recheck PTH level today. We will also check UPEP and SPEP with immunofixation.  Laboratory studies requested today: CBC, CMP, serum protein electrophoresis, immunofixation serum, magnesium , PTH, phosphorus, 25-hydroxy vitamin D , urinalysis, random urine protein/creatinine, urine protein electrophoresis.  Return in about 6 months (around 12/13/2021).  Adeyinka A Adegoroye, MD   She did not follow up with  them.   Pt gfr and cr numbers have decreased somewhat and have been trying to get her in again with nephrologist placing various referral. Pt has recently been trying to call with no success.. Various times my referral coordinator has  called.  I called number [phone] but did not get thru. Only got busy signal.     [facility] below today Olivia Morales is a 73 year old female with hypertension and chronic kidney disease who presents for medication management and follow-up.  She is currently taking clonidine  0.1 mg once daily in the morning as prescribed by her cardiologist and advised to continue hydralazine  100 mg tid. She also continues to take hydralazine  three times a day and has stopped amlodipine  as instructed.  She experiences sharp pains without swelling and takes tramadol  approximately once every one to two days for pain management, which she finds effective.  Treatment recently for  heart failure includes furosemide (Lasix). Late last week after cmp check, bnp and cxr I advise to take lasix 20 mg daily.  She initially took half of her 40 mg  tablet for four days but switched back to  full tablet due to forgetting the instructions.  She has been trying to contact a nephrologist for a follow-up but has been unsuccessful. She is scheduled for lab work today.  She has a history of anemia and elevated ferritin levels and has been referred to a hematologist for further evaluation. She has a sickle cell trait.  No shortness of breath recently. Pedal edema recently resolved.  Review of Systems  Constitutional:  Negative for chills, fatigue and fever.  Respiratory:  Negative for cough, chest tightness, wheezing and stridor.   Cardiovascular:  Negative for chest pain and palpitations.  Gastrointestinal:  Negative for abdominal pain and anal bleeding.  Genitourinary:  Negative for dyspareunia, enuresis, frequency and hematuria.  Musculoskeletal:  Negative for back pain.       See hpi still occasional lower ext pain.  Neurological:  Negative for facial asymmetry and light-headedness.  Hematological:  Negative for adenopathy.  Psychiatric/Behavioral:  The patient is not nervous/anxious.     Past Medical  History:  Diagnosis Date   Diabetes mellitus    Hypercholesteremia    Hypertension    Sickle cell trait (HCC)    Sleep apnea      Social History   Socioeconomic History   Marital status: Widowed    Spouse name: Not on file   Number of children: Not on file   Years of education: Not on file   Highest education level: Not on file  Occupational History   Not on file  Tobacco Use   Smoking status: Never   Smokeless tobacco: Never  Vaping Use   Vaping status: Never Used  Substance and Sexual Activity   Alcohol use: Yes    Comment: weekly   Drug use: No   Sexual activity: Not on file  Other Topics Concern   Not on file  Social History Narrative   Not on file   Social Drivers of Health   Financial Resource Strain: Low Risk  (06/05/2021)   Overall Financial Resource Strain (CARDIA)    Difficulty of Paying Living Expenses: Not hard at all  Food Insecurity: No Food Insecurity (08/23/2022)   Hunger Vital Sign    Worried About Running Out of Food in the Last Year: Never true    Ran Out of Food in the Last Year: Never true  Transportation Needs: No Transportation Needs (  08/23/2022)   PRAPARE - Administrator, Civil Service (Medical): No    Lack of Transportation (Non-Medical): No  Physical Activity: Inactive (06/05/2021)   Exercise Vital Sign    Days of Exercise per Week: 0 days    Minutes of Exercise per Session: 0 min  Stress: No Stress Concern Present (06/05/2021)   [employer] of Occupational Health - Occupational Stress Questionnaire    Feeling of Stress : Not at all  Social Connections: Moderately Integrated (06/05/2021)   Social Connection and Isolation Panel [NHANES]    Frequency of Communication with Friends and Family: More than three times a week    Frequency of Social Gatherings with Friends and Family: More than three times a week    Attends Religious Services: More than 4 times per year    Active Member of [employer] or Organizations: Yes    Attends  Banker Meetings: More than 4 times per year    Marital Status: Widowed  Intimate Partner Violence: Not At Risk (08/23/2022)   Humiliation, Afraid, Rape, and Kick questionnaire    Fear of Current or Ex-Partner: No    Emotionally Abused: No    Physically Abused: No    Sexually Abused: No    Past Surgical History:  Procedure Laterality Date   TUBAL LIGATION      Family History  Problem Relation Age of Onset   Sickle cell anemia Father     Allergies  Allergen Reactions   Ace Inhibitors     Cough    Current Outpatient Medications on File Prior to Visit  Medication Sig Dispense Refill   alendronate  (FOSAMAX ) 10 MG tablet Take 10 mg by mouth daily before breakfast. Take with a full glass of water on an empty stomach.     aspirin EC 81 MG tablet Take 81 mg by mouth daily. Swallow whole.     atorvastatin  (LIPITOR) 40 MG tablet TAKE 1 TABLET BY MOUTH DAILY 100 tablet 2   cloNIDine  (CATAPRES ) 0.1 MG tablet Take 1 tablet (0.1 mg total) by mouth once for 1 dose. 90 tablet 3   cloNIDine  (CATAPRES ) 0.1 MG tablet Take 1 tablet (0.1 mg total) by mouth once for 1 dose. 7 tablet 0   Cyanocobalamin  (VITAMIN B-12 PO) Take 1 tablet by mouth once a week.     empagliflozin (JARDIANCE) 10 MG TABS tablet Take 10 mg by mouth daily.     ferrous sulfate 325 (65 FE) MG tablet Take 325 mg by mouth daily with breakfast.     furosemide (LASIX) 40 MG tablet Take 40 mg by mouth daily.     hydrALAZINE  (APRESOLINE ) 25 MG tablet Take 3 tablets (75 mg total) by mouth 3 (three) times daily. (Patient taking differently: Take 100 mg by mouth 3 (three) times daily.) 270 tablet 11   Lancets (ONETOUCH DELICA PLUS LANCET33G) MISC USE TO TEST 1-2 TIMES DAILY (Patient taking differently: 1 each by Other route daily.) 100 each 0   metoprolol  succinate (TOPROL -XL) 50 MG 24 hr tablet TAKE 1 TABLET BY MOUTH DAILY  WITH OR IMMEDIATELY FOLLOWING A  MEAL (Patient taking differently: Take 50 mg by mouth daily.) 100  tablet 2   omeprazole  (PRILOSEC) 20 MG capsule Take 20 mg by mouth daily.     ONETOUCH VERIO test strip Check blood sugar 1-2 times daily (Patient taking differently: 1 each by Other route See admin instructions. Check blood sugar 1-2 times daily) 100 each 12   potassium chloride  (MICRO-K ) 10  MEQ CR capsule Take 10 mEq by mouth 2 (two) times daily.     traMADol  (ULTRAM ) 50 MG tablet Take 1 tablet (50 mg total) by mouth every 8 (eight) hours as needed for pain. (Patient taking differently: Take 50 mg by mouth every 8 (eight) hours as needed for moderate pain (pain score 4-6) or severe pain (pain score 7-10).) 12 tablet 0   No current facility-administered medications on file prior to visit.    BP 132/60   Pulse (!) 51   Resp 18   Ht 5\' 4"  (1.626 m)   Wt 144 lb (65.3 kg)   SpO2 100%   BMI 24.72 kg/m        Objective:   Physical Exam  General Mental Status- Alert. General Appearance- Not in acute distress.    Neck No JVD.  Chest and Lung Exam Auscultation: Breath Sounds:-CTA  Cardiovascular Auscultation:Rythm- RRR Murmurs & Other Heart Sounds:Auscultation of the heart reveals- No Murmurs.  Neurologic Cranial Nerve exam:- CN III-XII intact(No nystagmus), symmetric smile. Strength:- 5/5 equal and symmetric strength both upper and lower extremities.    Lower ext- no pedal edema, symmetric think calfs. Negative homans signs.     Assessment & Plan:   Patient Instructions  Congestive Heart Failure. Clinically well controlled. Managed with furosemide, Keeping in mind chronic kidney disease. No swelling or dyspnea reported. Delicate balance benefit vs risk discussed with pt. - Take furosemide 20 mg half  40 mg tablet daily. - Repeat BNP to confirm it remains low.  Hypertension Blood pressure controlled with current regimen. Clonidine  continued, amlodipine  discontinued per cardiologist. - Continue clonidine  0.1 mg once daily per cardiology - Discontinue amlodipine  as per  cardiologist's instructions. - Continue hydralazine  100 mg three times daily.  Chronic Kidney Disease Monitored with labs. Hydration encouraged. Nephrology referral pending.(various attempts to refer. Still pending. - Repeat metabolic panel to assess kidney function. - Encourage hydration to improve kidney function. - Continue efforts to secure nephrology referral.  Anemia Referral to nephologist place with hematology referral due to elevated ferritin and potential kidney function relation. Sickle cell trait noted. Just recently placed on iron by MD in [place] . Advised not to use iron(ferrous sulfate) since ferritin level high - Refer to hematologist for evaluation of anemia and elevated ferritin. - Repeat CBC to monitor anemia.  Follow up date to be determined after lab review.   Alliana Mcauliff, PA-C   Time spent with patient today was 43  minutes which consisted of chart revdiew, discussing diagnosis, work up treatment and documentation.

## 2023-11-07 ENCOUNTER — Other Ambulatory Visit (HOSPITAL_BASED_OUTPATIENT_CLINIC_OR_DEPARTMENT_OTHER): Payer: Self-pay

## 2023-11-07 LAB — BASIC METABOLIC PANEL WITH GFR
BUN/Creatinine Ratio: 9 — ABNORMAL LOW (ref 12–28)
BUN: 19 mg/dL (ref 8–27)
CO2: 22 mmol/L (ref 20–29)
Calcium: 10.1 mg/dL (ref 8.7–10.3)
Chloride: 106 mmol/L (ref 96–106)
Creatinine, Ser: 2.03 mg/dL — ABNORMAL HIGH (ref 0.57–1.00)
Glucose: 74 mg/dL (ref 70–99)
Potassium: 4.5 mmol/L (ref 3.5–5.2)
Sodium: 141 mmol/L (ref 134–144)
eGFR: 26 mL/min/{1.73_m2} — ABNORMAL LOW (ref >59)

## 2023-11-07 MED ORDER — TRAMADOL HCL 50 MG PO TABS
50.0000 mg | ORAL_TABLET | Freq: Three times a day (TID) | ORAL | 0 refills | Status: AC | PRN
  Filled 2023-11-07: qty 15, 5d supply, fill #0

## 2023-11-07 NOTE — Addendum Note (Signed)
 Addended by: Serafina Damme on: 11/07/2023 12:22 PM   Modules accepted: Orders

## 2023-11-12 ENCOUNTER — Inpatient Hospital Stay: Admitting: Medical Oncology

## 2023-11-12 ENCOUNTER — Inpatient Hospital Stay: Attending: Medical Oncology

## 2023-11-12 ENCOUNTER — Encounter: Payer: Self-pay | Admitting: Medical Oncology

## 2023-11-12 ENCOUNTER — Other Ambulatory Visit (HOSPITAL_BASED_OUTPATIENT_CLINIC_OR_DEPARTMENT_OTHER): Payer: Self-pay

## 2023-11-12 VITALS — BP 160/59 | HR 52 | Temp 98.8°F | Resp 20 | Ht 64.0 in | Wt 150.0 lb

## 2023-11-12 DIAGNOSIS — D573 Sickle-cell trait: Secondary | ICD-10-CM | POA: Insufficient documentation

## 2023-11-12 DIAGNOSIS — D649 Anemia, unspecified: Secondary | ICD-10-CM | POA: Diagnosis not present

## 2023-11-12 DIAGNOSIS — I129 Hypertensive chronic kidney disease with stage 1 through stage 4 chronic kidney disease, or unspecified chronic kidney disease: Secondary | ICD-10-CM | POA: Insufficient documentation

## 2023-11-12 DIAGNOSIS — R5383 Other fatigue: Secondary | ICD-10-CM

## 2023-11-12 DIAGNOSIS — E1122 Type 2 diabetes mellitus with diabetic chronic kidney disease: Secondary | ICD-10-CM | POA: Insufficient documentation

## 2023-11-12 DIAGNOSIS — N183 Chronic kidney disease, stage 3 unspecified: Secondary | ICD-10-CM | POA: Insufficient documentation

## 2023-11-12 DIAGNOSIS — K219 Gastro-esophageal reflux disease without esophagitis: Secondary | ICD-10-CM | POA: Insufficient documentation

## 2023-11-12 DIAGNOSIS — R7989 Other specified abnormal findings of blood chemistry: Secondary | ICD-10-CM | POA: Insufficient documentation

## 2023-11-12 DIAGNOSIS — Z79899 Other long term (current) drug therapy: Secondary | ICD-10-CM | POA: Diagnosis not present

## 2023-11-12 DIAGNOSIS — R0602 Shortness of breath: Secondary | ICD-10-CM

## 2023-11-12 DIAGNOSIS — D631 Anemia in chronic kidney disease: Secondary | ICD-10-CM | POA: Diagnosis not present

## 2023-11-12 DIAGNOSIS — E7800 Pure hypercholesterolemia, unspecified: Secondary | ICD-10-CM | POA: Insufficient documentation

## 2023-11-12 DIAGNOSIS — Z832 Family history of diseases of the blood and blood-forming organs and certain disorders involving the immune mechanism: Secondary | ICD-10-CM | POA: Insufficient documentation

## 2023-11-12 DIAGNOSIS — G4730 Sleep apnea, unspecified: Secondary | ICD-10-CM | POA: Diagnosis not present

## 2023-11-12 DIAGNOSIS — R700 Elevated erythrocyte sedimentation rate: Secondary | ICD-10-CM

## 2023-11-12 DIAGNOSIS — F5089 Other specified eating disorder: Secondary | ICD-10-CM | POA: Insufficient documentation

## 2023-11-12 DIAGNOSIS — K5900 Constipation, unspecified: Secondary | ICD-10-CM | POA: Diagnosis not present

## 2023-11-12 DIAGNOSIS — Z114 Encounter for screening for human immunodeficiency virus [HIV]: Secondary | ICD-10-CM

## 2023-11-12 LAB — CBC WITH DIFFERENTIAL (CANCER CENTER ONLY)
Abs Immature Granulocytes: 0.01 10*3/uL (ref 0.00–0.07)
Basophils Absolute: 0.1 10*3/uL (ref 0.0–0.1)
Basophils Relative: 1 %
Eosinophils Absolute: 0.1 10*3/uL (ref 0.0–0.5)
Eosinophils Relative: 3 %
HCT: 25.8 % — ABNORMAL LOW (ref 36.0–46.0)
Hemoglobin: 8.6 g/dL — ABNORMAL LOW (ref 12.0–15.0)
Immature Granulocytes: 0 %
Lymphocytes Relative: 21 %
Lymphs Abs: 0.8 10*3/uL (ref 0.7–4.0)
MCH: 28 pg (ref 26.0–34.0)
MCHC: 33.3 g/dL (ref 30.0–36.0)
MCV: 84 fL (ref 80.0–100.0)
Monocytes Absolute: 0.3 10*3/uL (ref 0.1–1.0)
Monocytes Relative: 9 %
Neutro Abs: 2.5 10*3/uL (ref 1.7–7.7)
Neutrophils Relative %: 66 %
Platelet Count: 242 10*3/uL (ref 150–400)
RBC: 3.07 MIL/uL — ABNORMAL LOW (ref 3.87–5.11)
RDW: 13.9 % (ref 11.5–15.5)
WBC Count: 3.7 10*3/uL — ABNORMAL LOW (ref 4.0–10.5)
nRBC: 0 % (ref 0.0–0.2)

## 2023-11-12 LAB — CMP (CANCER CENTER ONLY)
ALT: 9 U/L (ref 0–44)
AST: 14 U/L — ABNORMAL LOW (ref 15–41)
Albumin: 4 g/dL (ref 3.5–5.0)
Alkaline Phosphatase: 65 U/L (ref 38–126)
Anion gap: 6 (ref 5–15)
BUN: 27 mg/dL — ABNORMAL HIGH (ref 8–23)
CO2: 24 mmol/L (ref 22–32)
Calcium: 9.1 mg/dL (ref 8.9–10.3)
Chloride: 110 mmol/L (ref 98–111)
Creatinine: 2.18 mg/dL — ABNORMAL HIGH (ref 0.44–1.00)
GFR, Estimated: 24 mL/min — ABNORMAL LOW (ref >60)
Glucose, Bld: 122 mg/dL — ABNORMAL HIGH (ref 70–99)
Potassium: 3.8 mmol/L (ref 3.5–5.1)
Sodium: 140 mmol/L (ref 135–145)
Total Bilirubin: 0.4 mg/dL (ref 0.0–1.2)
Total Protein: 6.3 g/dL — ABNORMAL LOW (ref 6.5–8.1)

## 2023-11-12 LAB — C-REACTIVE PROTEIN: CRP: 1.2 mg/dL — ABNORMAL HIGH (ref <1.0)

## 2023-11-12 LAB — SAVE SMEAR(SSMR), FOR PROVIDER SLIDE REVIEW

## 2023-11-12 LAB — SEDIMENTATION RATE: Sed Rate: 23 mm/h — ABNORMAL HIGH (ref 0–22)

## 2023-11-12 LAB — FERRITIN: Ferritin: 567 ng/mL — ABNORMAL HIGH (ref 11–307)

## 2023-11-12 LAB — VITAMIN B12: Vitamin B-12: 1144 pg/mL — ABNORMAL HIGH (ref 180–914)

## 2023-11-12 NOTE — Progress Notes (Signed)
 [facility] Telephone:([phone]   [phone]  INITIAL CONSULT NOTE  Patient Care Team: Saguier, Slater Duncan as PCP - General (Internal Medicine)  CHIEF COMPLAINTS/PURPOSE OF CONSULTATION:  Anemia in the setting of an elevated ferritin level  HISTORY OF PRESENTING ILLNESS:  Olivia Morales 73 y.o. female is referred to our office by their PCP Edward Saguier PA-C for anemia and elevated iron levels. She brings in her granddaughter who helps assist with her health history.   Blood in stools:No Change of bowel movement/constipation: Yes- Started a stool softener which has helped. Family history of GI cancers: No Last Colonoscopy: 3 or 4 years ago. Due for a phone screening soon Last Endoscopy: Yes- 25 years ago History of GERD or GI bleed: Yes for GERD- omeprazole . No GI bleed UC/Crohn's: No PPI use: Yes Hiatal Hernia: No NSAID use: Yes Blood in urine: No Difficulty swallowing: No Pica: Yes SOB: Yes Fatigue: Yes Prior blood transfusion: No Prior history of blood loss: No Liver disease: No Kidney Disease: Yes Bariatric/Intestinal surgery: No Frequent Blood Donation: No Vaginal bleeding: No Prior evaluation with hematology: No Prior bone marrow biopsy: No Oral iron: Yes in the past- daily- takes this in the morning- stopped x 2 weeks due to elevated ferritin Prior IV iron infusions: Yes many years ago- tolerated it well  Family history Anemia: No known sickle cell or thalassemia - reports sickle cell trait - Father passed from sickle cell anemia.  No Personal or family history of bleeding or clotting disorders.  Special diets: Low sodium No use of GLP-1: No Iron rich foods in diet: Some Eating habits: Yes but eating smaller amounts now  ETOH intake- rare No known rheumatological diseases    She has an appointment with her Kidney specialist on May 19th   She takes B12 pills- 5,000mcg every Tuesday.   MEDICAL HISTORY:  Past Medical  History:  Diagnosis Date   Diabetes mellitus    Hypercholesteremia    Hypertension    Sickle cell trait (HCC)    Sleep apnea     SURGICAL HISTORY: Past Surgical History:  Procedure Laterality Date   TUBAL LIGATION      SOCIAL HISTORY: Social History   Socioeconomic History   Marital status: Widowed    Spouse name: Not on file   Number of children: Not on file   Years of education: Not on file   Highest education level: Not on file  Occupational History   Not on file  Tobacco Use   Smoking status: Never   Smokeless tobacco: Never  Vaping Use   Vaping status: Never Used  Substance and Sexual Activity   Alcohol use: Yes    Comment: weekly   Drug use: No   Sexual activity: Not on file  Other Topics Concern   Not on file  Social History Narrative   Not on file   Social Drivers of Health   Financial Resource Strain: Low Risk  (06/05/2021)   Overall Financial Resource Strain (CARDIA)    Difficulty of Paying Living Expenses: Not hard at all  Food Insecurity: No Food Insecurity (08/23/2022)   Hunger Vital Sign    Worried About Running Out of Food in the Last Year: Never true    Ran Out of Food in the Last Year: Never true  Transportation Needs: No Transportation Needs (08/23/2022)   PRAPARE - Administrator, Civil Service (Medical): No    Lack of Transportation (Non-Medical): No  Physical Activity: Inactive (  06/05/2021)   Exercise Vital Sign    Days of Exercise per Week: 0 days    Minutes of Exercise per Session: 0 min  Stress: No Stress Concern Present (06/05/2021)   [employer] of Occupational Health - Occupational Stress Questionnaire    Feeling of Stress : Not at all  Social Connections: Moderately Integrated (06/05/2021)   Social Connection and Isolation Panel [NHANES]    Frequency of Communication with Friends and Family: More than three times a week    Frequency of Social Gatherings with Friends and Family: More than three times a week     Attends Religious Services: More than 4 times per year    Active Member of [employer] or Organizations: Yes    Attends Banker Meetings: More than 4 times per year    Marital Status: Widowed  Intimate Partner Violence: Not At Risk (08/23/2022)   Humiliation, Afraid, Rape, and Kick questionnaire    Fear of Current or Ex-Partner: No    Emotionally Abused: No    Physically Abused: No    Sexually Abused: No    FAMILY HISTORY: Family History  Problem Relation Age of Onset   Sickle cell anemia Father     ALLERGIES:  is allergic to ace inhibitors.  MEDICATIONS:  Current Outpatient Medications  Medication Sig Dispense Refill   alendronate  (FOSAMAX ) 10 MG tablet Take 10 mg by mouth daily before breakfast. Take with a full glass of water on an empty stomach.     aspirin EC 81 MG tablet Take 81 mg by mouth daily. Swallow whole.     atorvastatin  (LIPITOR) 40 MG tablet TAKE 1 TABLET BY MOUTH DAILY 100 tablet 2   cloNIDine  (CATAPRES ) 0.1 MG tablet Take 1 tablet (0.1 mg total) by mouth once for 1 dose. 90 tablet 3   Cyanocobalamin  (VITAMIN B-12 PO) Take 1 tablet by mouth once a week.     empagliflozin (JARDIANCE) 10 MG TABS tablet Take 10 mg by mouth daily.     ferrous sulfate 325 (65 FE) MG tablet Take 325 mg by mouth daily with breakfast.     furosemide (LASIX) 40 MG tablet Take 40 mg by mouth daily.     hydrALAZINE  (APRESOLINE ) 25 MG tablet Take 3 tablets (75 mg total) by mouth 3 (three) times daily. (Patient taking differently: Take 100 mg by mouth 3 (three) times daily.) 270 tablet 11   Lancets (ONETOUCH DELICA PLUS LANCET33G) MISC USE TO TEST 1-2 TIMES DAILY (Patient taking differently: 1 each by Other route daily.) 100 each 0   metoprolol  succinate (TOPROL -XL) 50 MG 24 hr tablet TAKE 1 TABLET BY MOUTH DAILY  WITH OR IMMEDIATELY FOLLOWING A  MEAL (Patient taking differently: Take 50 mg by mouth daily.) 100 tablet 2   omeprazole  (PRILOSEC) 20 MG capsule Take 20 mg by mouth daily.      ONETOUCH VERIO test strip Check blood sugar 1-2 times daily (Patient taking differently: 1 each by Other route See admin instructions. Check blood sugar 1-2 times daily) 100 each 12   potassium chloride  (MICRO-K ) 10 MEQ CR capsule Take 10 mEq by mouth 2 (two) times daily.     traMADol  (ULTRAM ) 50 MG tablet Take 1 tablet (50 mg total) by mouth every 8 (eight) hours as needed for up to 5 days. 15 tablet 0   cloNIDine  (CATAPRES ) 0.1 MG tablet Take 1 tablet (0.1 mg total) by mouth once for 1 dose. 7 tablet 0   No current facility-administered medications for this  visit.    REVIEW OF SYSTEMS:   Constitutional: ( - ) fevers, ( - )  chills , ( - ) night sweats Eyes: ( - ) blurriness of vision, ( - ) double vision, ( - ) watery eyes Ears, nose, mouth, throat, and face: ( - ) mucositis, ( - ) sore throat Respiratory: ( - ) cough, ( - ) dyspnea, ( - ) wheezes Cardiovascular: ( - ) palpitation, ( - ) chest discomfort, ( - ) lower extremity swelling Gastrointestinal:  ( - ) nausea, ( - ) heartburn, ( - ) change in bowel habits Skin: ( - ) abnormal skin rashes Lymphatics: ( - ) new lymphadenopathy, ( - ) easy bruising Neurological: ( - ) numbness, ( - ) tingling, ( - ) new weaknesses Behavioral/Psych: ( - ) mood change, ( - ) new changes  All other systems were reviewed with the patient and are negative.  PHYSICAL EXAMINATION: ECOG PERFORMANCE STATUS: 1 - Symptomatic but completely ambulatory  Vitals:   11/12/23 1255  BP: (!) 160/59  Pulse: (!) 52  Resp: 20  Temp: 98.8 F (37.1 C)  SpO2: 100%   Filed Weights   11/12/23 1255  Weight: 150 lb (68 kg)    GENERAL: well appearing female in NAD  SKIN: skin color, texture, turgor are normal, no rashes or significant lesions EYES: conjunctiva are light pink and non-injected, sclera clear OROPHARYNX: no exudate, no erythema; lips, buccal mucosa, and tongue are light pink, poor dentition   NECK: supple, non-tender LYMPH:  no palpable  lymphadenopathy in the cervical, axillary or supraclavicular lymph nodes.  LUNGS: clear to auscultation and percussion with normal breathing effort HEART: regular rate & rhythm and no murmurs and no lower extremity edema ABDOMEN: soft, non-tender, non-distended, normal bowel sounds Musculoskeletal: no cyanosis of digits and no clubbing  PSYCH: alert & oriented x 3, fluent speech NEURO: no focal motor/sensory deficits  LABORATORY DATA:  Pending   ASSESSMENT & PLAN Olivia Morales is a 73 y.o. African American female who was referred to us  for anemia in the setting of elevated ferritin level. She has known sickle cell trait.   CBC from 11/04/2023: WBC 2.5, Hgb 9.4, MCV 85.5, Monocytes relative, ANC 1.4, platelets 243. Creatinine 2.20, normal LFTs  Review of her history and medical records warrant additional work up.  Regarding her anemia with CKD I will check an erythropoietin level, MM work up, inflammatory labs and nutritional labs. Elevated ferritin likely secondary to inflammation. No known liver disease. Hereditary hemochromatosis labs if indicated based on first set of labs.  Flow cytometry/BCR- ABL FISH, HIV, Hep C/B testing at follow up if indicated based off of labs.   All questions were answered. The patient knows to call the clinic with any problems, questions or concerns.  I have spent a total of 60 minutes minutes of face-to-face and non-face-to-face time, preparing to see the patient, obtaining and/or reviewing separately obtained history, performing a medically appropriate examination, counseling and educating the patient, ordering medications/tests/procedures, referring and communicating with other health care professionals, documenting clinical information in the electronic health record, independently interpreting results and communicating results to the patient, and care coordination.    Sunnie England PA-C Department of Hematology/Oncology [facility]  at [facility]

## 2023-11-13 LAB — IRON AND IRON BINDING CAPACITY (CC-WL,HP ONLY)
Iron: 57 ug/dL (ref 28–170)
Saturation Ratios: 23 % (ref 10.4–31.8)
TIBC: 245 ug/dL — ABNORMAL LOW (ref 250–450)
UIBC: 188 ug/dL (ref 148–442)

## 2023-11-13 LAB — ERYTHROPOIETIN: Erythropoietin: 9.6 m[IU]/mL (ref 2.6–18.5)

## 2023-11-13 LAB — KAPPA/LAMBDA LIGHT CHAINS
Kappa free light chain: 43.6 mg/L — ABNORMAL HIGH (ref 3.3–19.4)
Kappa, lambda light chain ratio: 1.04 (ref 0.26–1.65)
Lambda free light chains: 42.1 mg/L — ABNORMAL HIGH (ref 5.7–26.3)

## 2023-11-15 ENCOUNTER — Telehealth: Payer: Self-pay | Admitting: Medical

## 2023-11-15 NOTE — Telephone Encounter (Signed)
 Copied from CRM [id number]. Topic: General - Other >> Nov 15, 2023  9:55 AM [name] wrote: Reason for CRM: Patient is requesting a callback from [place]. Stated her PCP tried calling her 2 days and agent could not locate what this was in regards too. Callback number for patient is [phone].

## 2023-11-15 NOTE — Telephone Encounter (Signed)
 Pt stated PCP called her this week , made her aware that PCP was out of the office but she stated he did call her and told her to take lasix every 3 days, and would like a call back.

## 2023-11-18 LAB — MULTIPLE MYELOMA PANEL, SERUM
Albumin SerPl Elph-Mcnc: 3.5 g/dL (ref 2.9–4.4)
Albumin/Glob SerPl: 1.3 (ref 0.7–1.7)
Alpha 1: 0.2 g/dL (ref 0.0–0.4)
Alpha2 Glob SerPl Elph-Mcnc: 0.7 g/dL (ref 0.4–1.0)
B-Globulin SerPl Elph-Mcnc: 0.8 g/dL (ref 0.7–1.3)
Gamma Glob SerPl Elph-Mcnc: 1 g/dL (ref 0.4–1.8)
Globulin, Total: 2.7 g/dL (ref 2.2–3.9)
IgA: 99 mg/dL (ref 64–422)
IgG (Immunoglobin G), Serum: 1080 mg/dL (ref 586–1602)
IgM (Immunoglobulin M), Srm: 64 mg/dL (ref 26–217)
Total Protein ELP: 6.2 g/dL (ref 6.0–8.5)

## 2023-11-18 NOTE — Telephone Encounter (Signed)
 11-13-2023 Called pt after reviewing hematologist notes. Saw that on review of that note hematology provider stated pt was on nsaids. That has not been the case since I advised to stop(advised not to use) months ago after seeing podiatrist prescription nsaids. Called to verify not using any nsaids. Pt agrees she has not been using. Also thankfully confirmed with pt she has appointment with nephrologist next week. Advised to use lasix now 20 mg every 3rd day pending nephrologist opinion. Call to make follow up appointment with me on 11/26/2023.   11-17-2033  Call pt back around 11-17-2023 1 pm. She stated just needed clarification on my advise on furosemide. Explained do want her to just take furosemide 20 mg every 3rd day. On review pt stated no leg swelling, no shortness of breath and urinating normal. Pt confirmed does have appointment with nephrologist on 12-19-2023 today.(Not nephrologist may give different advise on furosemide). Reminded pt not in office before Thursday so if something come up and she needs to call office for clinical advise give info to staff. Explained they would pass message to covering provider if needed.

## 2023-11-19 ENCOUNTER — Other Ambulatory Visit: Payer: Self-pay | Admitting: Medical Oncology

## 2023-11-19 DIAGNOSIS — R700 Elevated erythrocyte sedimentation rate: Secondary | ICD-10-CM

## 2023-11-21 ENCOUNTER — Telehealth: Payer: Self-pay

## 2023-11-21 ENCOUNTER — Ambulatory Visit: Payer: Self-pay | Admitting: Medical Oncology

## 2023-11-21 ENCOUNTER — Ambulatory Visit: Payer: Self-pay | Admitting: Cardiology

## 2023-11-21 NOTE — Telephone Encounter (Signed)
 Called patient to inform her that  her labs are suggestive that she would benefit from a medication that help stimulate red blood cell production and she will need to see a provider within the next 2 weeks for labs, APP +_ aranesp, per Sunnie England, PA. Both numners home, and cellular were called with no answer. Did not leave message since the numbers on the chart do not match the number on the DPR. Message to scheduling was sent to call the patient and set up her appointments.

## 2023-11-21 NOTE — Telephone Encounter (Signed)
-----   Message from Olivia Morales sent at 11/21/2023  4:09 PM EDT ----- Labs suggestive that she would benefit from a medication that help stimulate red blood cell production. Please schedule her a visit with me within the next 2 weeks (if not already scheduled) for labs, APP +_ aranesp

## 2023-11-21 NOTE — Telephone Encounter (Signed)
 Left message on My Chart with lab results per Dr. Vanetta Shawl note. Routed to PCP.

## 2023-11-22 ENCOUNTER — Ambulatory Visit: Attending: Cardiology | Admitting: Cardiology

## 2023-11-25 ENCOUNTER — Telehealth: Payer: Self-pay | Admitting: Medical

## 2023-11-25 NOTE — Telephone Encounter (Signed)
 Would you call pt and ask if she ever attended nephrologist appointment. I understood she had appointment this past week. But on review I don't see any note in epic. Please call pt and ask if she went? And if so where as not record in epci?

## 2023-11-26 ENCOUNTER — Telehealth: Payer: Self-pay | Admitting: Medical Oncology

## 2023-11-26 ENCOUNTER — Telehealth: Payer: Self-pay

## 2023-11-26 NOTE — Telephone Encounter (Signed)
 Copied from CRM [id number]. Topic: General - Other >> Nov 26, 2023 11:45 AM Danae Duncans wrote: Reason for CRM: pt advise she did attend nephrologist appt , stated they will mail her the lab results or if you would like to contact their office you could, she advise she signed medical release for Dr. Saguier to obtain all information

## 2023-11-26 NOTE — Telephone Encounter (Signed)
 Pt called and lvm to return call

## 2023-11-26 NOTE — Telephone Encounter (Signed)
 Called to move up pt's f/u appt per inbasket. LVM to return call for scheduling.

## 2023-11-26 NOTE — Telephone Encounter (Signed)
 Pt called back and CRM sent to PCP

## 2023-11-27 NOTE — Telephone Encounter (Signed)
 Form faxed

## 2023-11-29 ENCOUNTER — Ambulatory Visit: Admitting: Medical

## 2023-11-29 ENCOUNTER — Encounter: Payer: Self-pay | Admitting: Medical

## 2023-11-29 VITALS — BP 170/80 | HR 80 | Resp 18 | Ht 64.0 in | Wt 146.0 lb

## 2023-11-29 DIAGNOSIS — E1165 Type 2 diabetes mellitus with hyperglycemia: Secondary | ICD-10-CM

## 2023-11-29 DIAGNOSIS — I509 Heart failure, unspecified: Secondary | ICD-10-CM | POA: Diagnosis not present

## 2023-11-29 DIAGNOSIS — N189 Chronic kidney disease, unspecified: Secondary | ICD-10-CM

## 2023-11-29 NOTE — Patient Instructions (Addendum)
 Chronic Kidney Disease, Stage 3 CKD with stable GFR around 30, slight improvement noted. Fluid management emphasized to prevent dehydration. - Continue hydralazine  and clonidine . With hx of chf on low dose lasix 20 mg  every 3rd day but knows if gets severe pedal edema can take full tab daily - Follow up with nephrologist as scheduled. -no repeat cmp today as just had earlier this week.  Hypertension Hypertension with improved readings at specialist office but 170/80 today when I checked, current regimen includes hydralazine , betablocker and clonidine . - Continue current antihypertensive regimen. -on high dose hydralazine .  Attempting to reach out to cardiologist to see if can increase clonidine  to 0.1 mg twice daily.(watiing for response). Will update you when he responds to message. - Verify blood pressure readings for accuracy.(Get new machine from [employer]) - Follow up with cardiologist in 1-2 weeks.  chf clinically stable.  Edema controlled with leg elevation and intermittent Lasix, no current swelling. - Continue leg elevation and compression as needed. - Take Lasix 20 mg every third day, full dose if swelling occurs. Continue b blocker - if getting sob and increased leg swelling let me know. -referral placed to get you back in with Dr. Gordan Latina cardiologist.  Elevated Lipase Elevated lipase with GI referral for further evaluation, endoscopy scheduled. - Proceed with scheduled endoscopy.  Follow up with me in 3 weeks or sooner if needed.

## 2023-11-29 NOTE — Progress Notes (Signed)
 Subjective:    Patient ID: Olivia Morales, female    DOB: [birth year], 73 y.o.   MRN: [id number]  HPI  Pt has hypertension, chf  and chronic kidney disease who presents for follow-up of his medical conditions.  She has been attending many referral appointments, including those at the [facility] and the hematology and gI appointemnt upcoming for endoscopy. A previous issue with a phone screening for a colonoscopy was resolved by rescheduling the appointment.  Her blood pressure readings have varied, with a recent reading of 130/80 mmHg at [facility], which he states is the best it has been in 15 years. However, her blood pressure was elevated at today's visit She is currently taking hydralazine  100 mg three times a day, clonidine  0.1  once a day, and Lasix 20 mg every third day. In additional metoprolol  50 mg daily.  Her reports a weight gain of six pounds but no current leg swelling,(but overall wt down since mid May) attributing improvement to elevating his legs with a pillow. He has not been using compression bandages recently.  Her kidney function, specifically his glomerular filtration rate (GFR), has improved to the 30 range. She received an explanation of  lab results from her insurance provider, which helped him understand medical abbreviations.  He is awaiting a follow-up appointment with his cardiologist, Dr. Krazowski, which was supposed to occur one month after his last visit on May 2nd, but has not yet been scheduled.     Review of Systems  Constitutional:  Negative for chills, fatigue and fever.  HENT:  Negative for congestion and ear discharge.   Respiratory:  Negative for cough, chest tightness, shortness of breath and wheezing.   Cardiovascular:  Negative for chest pain and palpitations.  Gastrointestinal:  Negative for abdominal pain and constipation.  Genitourinary:  Negative for dyspareunia and frequency.  Musculoskeletal:   Negative for back pain, myalgias and neck stiffness.  Skin:  Negative for rash.  Neurological:  Negative for dizziness, seizures, speech difficulty, weakness and light-headedness.  Hematological:  Negative for adenopathy. Does not bruise/bleed easily.  Psychiatric/Behavioral:  Negative for behavioral problems and decreased concentration.     Past Medical History:  Diagnosis Date   Diabetes mellitus    Hypercholesteremia    Hypertension    Sickle cell trait (HCC)    Sleep apnea      Social History   Socioeconomic History   Marital status: Widowed    Spouse name: Not on file   Number of children: Not on file   Years of education: Not on file   Highest education level: Not on file  Occupational History   Not on file  Tobacco Use   Smoking status: Never   Smokeless tobacco: Never  Vaping Use   Vaping status: Never Used  Substance and Sexual Activity   Alcohol use: Yes    Comment: drinks beer on occassion   Drug use: Yes    Types: "Crack" cocaine    Comment: history of smoking crack ...it's been a 1.5 years   Sexual activity: Not on file  Other Topics Concern   Not on file  Social History Narrative   Not on file   Social Drivers of Health   Financial Resource Strain: Low Risk  (06/05/2021)   Overall Financial Resource Strain (CARDIA)    Difficulty of Paying Living Expenses: Not hard at all  Food Insecurity: No Food Insecurity (11/12/2023)   Hunger Vital Sign  Worried About Programme researcher, broadcasting/film/video in the Last Year: Never true    Ran Out of Food in the Last Year: Never true  Transportation Needs: No Transportation Needs (11/12/2023)   PRAPARE - Administrator, Civil Service (Medical): No    Lack of Transportation (Non-Medical): No  Physical Activity: Inactive (06/05/2021)   Exercise Vital Sign    Days of Exercise per Week: 0 days    Minutes of Exercise per Session: 0 min  Stress: No Stress Concern Present (06/05/2021)   [employer] of Occupational  Health - Occupational Stress Questionnaire    Feeling of Stress : Not at all  Social Connections: Moderately Integrated (06/05/2021)   Social Connection and Isolation Panel [NHANES]    Frequency of Communication with Friends and Family: More than three times a week    Frequency of Social Gatherings with Friends and Family: More than three times a week    Attends Religious Services: More than 4 times per year    Active Member of [employer] or Organizations: Yes    Attends Banker Meetings: More than 4 times per year    Marital Status: Widowed  Intimate Partner Violence: Not At Risk (11/12/2023)   Humiliation, Afraid, Rape, and Kick questionnaire    Fear of Current or Ex-Partner: No    Emotionally Abused: No    Physically Abused: No    Sexually Abused: No    Past Surgical History:  Procedure Laterality Date   TUBAL LIGATION      Family History  Problem Relation Age of Onset   Sickle cell anemia Father     Allergies  Allergen Reactions   Ace Inhibitors Cough    Current Outpatient Medications on File Prior to Visit  Medication Sig Dispense Refill   alendronate  (FOSAMAX ) 10 MG tablet Take 10 mg by mouth daily before breakfast. Take with a full glass of water on an empty stomach.     aspirin EC 81 MG tablet Take 81 mg by mouth daily. Swallow whole.     atorvastatin  (LIPITOR) 40 MG tablet TAKE 1 TABLET BY MOUTH DAILY 100 tablet 2   cloNIDine  (CATAPRES ) 0.1 MG tablet Take 1 tablet (0.1 mg total) by mouth once for 1 dose. 90 tablet 3   cloNIDine  (CATAPRES ) 0.1 MG tablet Take 1 tablet (0.1 mg total) by mouth once for 1 dose. 7 tablet 0   Cyanocobalamin  (VITAMIN B-12 PO) Take 1 tablet by mouth once a week.     empagliflozin (JARDIANCE) 10 MG TABS tablet Take 10 mg by mouth daily.     ferrous sulfate 325 (65 FE) MG tablet Take 325 mg by mouth daily with breakfast.     furosemide (LASIX) 40 MG tablet Take 40 mg by mouth daily.     hydrALAZINE  (APRESOLINE ) 25 MG tablet Take 3  tablets (75 mg total) by mouth 3 (three) times daily. (Patient taking differently: Take 100 mg by mouth 3 (three) times daily.) 270 tablet 11   Lancets (ONETOUCH DELICA PLUS LANCET33G) MISC USE TO TEST 1-2 TIMES DAILY (Patient taking differently: 1 each by Other route daily.) 100 each 0   metoprolol  succinate (TOPROL -XL) 50 MG 24 hr tablet TAKE 1 TABLET BY MOUTH DAILY  WITH OR IMMEDIATELY FOLLOWING A  MEAL (Patient taking differently: Take 50 mg by mouth daily.) 100 tablet 2   omeprazole  (PRILOSEC) 20 MG capsule Take 20 mg by mouth daily.     ONETOUCH VERIO test strip Check blood sugar 1-2 times  daily (Patient taking differently: 1 each by Other route See admin instructions. Check blood sugar 1-2 times daily) 100 each 12   potassium chloride  (MICRO-K ) 10 MEQ CR capsule Take 10 mEq by mouth 2 (two) times daily.     No current facility-administered medications on file prior to visit.    BP (!) 170/80   Pulse 80   Resp 18   Ht 5\' 4"  (1.626 m)   Wt 146 lb (66.2 kg)   SpO2 99%   BMI 25.06 kg/m        Objective:   Physical Exam  General Mental Status- Alert. General Appearance- Not in acute distress.   Skin General: Color- Normal Color. Moisture- Normal Moisture.  Neck  No JVD.  Chest and Lung Exam Auscultation: Breath Sounds:-CTA  Cardiovascular Auscultation:Rythm- RRR Murmurs & Other Heart Sounds:Auscultation of the heart reveals- No Murmurs.  Abdomen Inspection:-Inspeection Normal. Palpation/Percussion:Note:No mass. Palpation and Percussion of the abdomen reveal- Non Tender, Non Distended + BS, no rebound or guarding.    Neurologic Cranial Nerve exam:- CN III-XII intact grossly intact(No nystagmus), symmetric smile.   Lower ext- calfs symmetric, negative homans sign. On inspection may have faint pedal edema at ankle level bilaterally.     Assessment & Plan:   Patient Instructions  Chronic Kidney Disease, Stage 3 CKD with stable GFR around 30, slight improvement  noted. Fluid management emphasized to prevent dehydration. - Continue hydralazine  and clonidine . With hx of chf on low dose lasix 20 mg  every 3rd day but knows if gets severe pedal edema can take full tab daily - Follow up with nephrologist as scheduled. -no repeat cmp today as just had earlier this week.  Hypertension Hypertension with improved readings at specialist office but 170/80 today when I checked, current regimen includes hydralazine , betablocker and clonidine . - Continue current antihypertensive regimen. -on high dose hydralazine .  Attempting to reach out to cardiologist to see if can increase clonidine  to 0.1 mg twice daily.(watiing for response). Will update you when he responds to message. - Verify blood pressure readings for accuracy.(Get new machine from [employer]) - Follow up with cardiologist in 1-2 weeks.  chf clinically stable.  Edema controlled with leg elevation and intermittent Lasix, no current swelling. - Continue leg elevation and compression as needed. - Take Lasix 20 mg every third day, full dose if swelling occurs. Continue b blocker - if getting sob and increased leg swelling let me know. -referral placed to get you back in with Dr. Gordan Latina cardiologist.  Elevated Lipase Elevated lipase with GI referral for further evaluation, endoscopy scheduled. - Proceed with scheduled endoscopy.  Follow up with me in 3 weeks or sooner if needed.   Olivia Jhaveri, PA-C   Time spent with patient today was 45  minutes which consisted of chart revdiew, discussing diagnosis, work up treatment and documentation.

## 2023-12-02 ENCOUNTER — Telehealth: Payer: Self-pay | Admitting: Cardiology

## 2023-12-02 NOTE — Telephone Encounter (Signed)
 Pt c/o medication issue:  1. Name of Medication:   cloNIDine  (CATAPRES ) 0.1 MG tablet (Expired)    2. How are you currently taking this medication (dosage and times per day)?   3. Are you having a reaction (difficulty breathing--STAT)? No  4. What is your medication issue? Pt would like a c/b regarding dosage increase for medication per her PCP. Please advise

## 2023-12-02 NOTE — Telephone Encounter (Signed)
 Pt states that her BP is elevated only when she goes to the PCP office and wanted to increase her medication. Advised to get a BP cuff and check BP 1-2 hours after medications.

## 2023-12-03 ENCOUNTER — Encounter: Payer: Self-pay | Admitting: Medical Oncology

## 2023-12-03 ENCOUNTER — Inpatient Hospital Stay: Attending: Medical Oncology

## 2023-12-03 ENCOUNTER — Inpatient Hospital Stay

## 2023-12-03 ENCOUNTER — Inpatient Hospital Stay: Admitting: Medical Oncology

## 2023-12-03 VITALS — BP 145/68 | HR 58 | Temp 98.4°F | Resp 19 | Ht 64.0 in | Wt 145.8 lb

## 2023-12-03 DIAGNOSIS — D649 Anemia, unspecified: Secondary | ICD-10-CM

## 2023-12-03 DIAGNOSIS — R5381 Other malaise: Secondary | ICD-10-CM | POA: Insufficient documentation

## 2023-12-03 DIAGNOSIS — N183 Chronic kidney disease, stage 3 unspecified: Secondary | ICD-10-CM | POA: Insufficient documentation

## 2023-12-03 DIAGNOSIS — Z7982 Long term (current) use of aspirin: Secondary | ICD-10-CM | POA: Insufficient documentation

## 2023-12-03 DIAGNOSIS — E1122 Type 2 diabetes mellitus with diabetic chronic kidney disease: Secondary | ICD-10-CM | POA: Diagnosis not present

## 2023-12-03 DIAGNOSIS — D573 Sickle-cell trait: Secondary | ICD-10-CM | POA: Insufficient documentation

## 2023-12-03 DIAGNOSIS — G4730 Sleep apnea, unspecified: Secondary | ICD-10-CM | POA: Insufficient documentation

## 2023-12-03 DIAGNOSIS — I130 Hypertensive heart and chronic kidney disease with heart failure and stage 1 through stage 4 chronic kidney disease, or unspecified chronic kidney disease: Secondary | ICD-10-CM | POA: Insufficient documentation

## 2023-12-03 DIAGNOSIS — R700 Elevated erythrocyte sedimentation rate: Secondary | ICD-10-CM

## 2023-12-03 DIAGNOSIS — R7989 Other specified abnormal findings of blood chemistry: Secondary | ICD-10-CM

## 2023-12-03 DIAGNOSIS — Z79899 Other long term (current) drug therapy: Secondary | ICD-10-CM | POA: Diagnosis not present

## 2023-12-03 DIAGNOSIS — N189 Chronic kidney disease, unspecified: Secondary | ICD-10-CM | POA: Diagnosis not present

## 2023-12-03 DIAGNOSIS — I5032 Chronic diastolic (congestive) heart failure: Secondary | ICD-10-CM | POA: Diagnosis not present

## 2023-12-03 DIAGNOSIS — D631 Anemia in chronic kidney disease: Secondary | ICD-10-CM | POA: Diagnosis not present

## 2023-12-03 DIAGNOSIS — Z114 Encounter for screening for human immunodeficiency virus [HIV]: Secondary | ICD-10-CM

## 2023-12-03 DIAGNOSIS — R5383 Other fatigue: Secondary | ICD-10-CM | POA: Insufficient documentation

## 2023-12-03 DIAGNOSIS — R531 Weakness: Secondary | ICD-10-CM | POA: Insufficient documentation

## 2023-12-03 LAB — CMP (CANCER CENTER ONLY)
ALT: 5 U/L (ref 0–44)
AST: 18 U/L (ref 15–41)
Albumin: 3.6 g/dL (ref 3.5–5.0)
Alkaline Phosphatase: 69 U/L (ref 38–126)
Anion gap: 11 (ref 5–15)
BUN: 21 mg/dL (ref 8–23)
CO2: 22 mmol/L (ref 22–32)
Calcium: 8.9 mg/dL (ref 8.9–10.3)
Chloride: 111 mmol/L (ref 98–111)
Creatinine: 2.19 mg/dL — ABNORMAL HIGH (ref 0.44–1.00)
GFR, Estimated: 23 mL/min — ABNORMAL LOW (ref >60)
Glucose, Bld: 117 mg/dL — ABNORMAL HIGH (ref 70–99)
Potassium: 3.7 mmol/L (ref 3.5–5.1)
Sodium: 143 mmol/L (ref 135–145)
Total Bilirubin: 0.4 mg/dL (ref 0.0–1.2)
Total Protein: 6.1 g/dL — ABNORMAL LOW (ref 6.5–8.1)

## 2023-12-03 LAB — CBC WITH DIFFERENTIAL (CANCER CENTER ONLY)
Abs Immature Granulocytes: 0.01 10*3/uL (ref 0.00–0.07)
Basophils Absolute: 0 10*3/uL (ref 0.0–0.1)
Basophils Relative: 1 %
Eosinophils Absolute: 0.1 10*3/uL (ref 0.0–0.5)
Eosinophils Relative: 2 %
HCT: 24.3 % — ABNORMAL LOW (ref 36.0–46.0)
Hemoglobin: 8 g/dL — ABNORMAL LOW (ref 12.0–15.0)
Immature Granulocytes: 0 %
Lymphocytes Relative: 20 %
Lymphs Abs: 0.8 10*3/uL (ref 0.7–4.0)
MCH: 27.2 pg (ref 26.0–34.0)
MCHC: 32.9 g/dL (ref 30.0–36.0)
MCV: 82.7 fL (ref 80.0–100.0)
Monocytes Absolute: 0.5 10*3/uL (ref 0.1–1.0)
Monocytes Relative: 13 %
Neutro Abs: 2.4 10*3/uL (ref 1.7–7.7)
Neutrophils Relative %: 64 %
Platelet Count: 235 10*3/uL (ref 150–400)
RBC: 2.94 MIL/uL — ABNORMAL LOW (ref 3.87–5.11)
RDW: 13.2 % (ref 11.5–15.5)
WBC Count: 3.7 10*3/uL — ABNORMAL LOW (ref 4.0–10.5)
nRBC: 0 % (ref 0.0–0.2)

## 2023-12-03 LAB — SEDIMENTATION RATE: Sed Rate: 30 mm/h — ABNORMAL HIGH (ref 0–22)

## 2023-12-03 LAB — HEPATITIS C ANTIBODY: HCV Ab: NONREACTIVE

## 2023-12-03 LAB — RETIC PANEL
Immature Retic Fract: 9.6 % (ref 2.3–15.9)
RBC.: 2.92 MIL/uL — ABNORMAL LOW (ref 3.87–5.11)
Retic Count, Absolute: 27.4 10*3/uL (ref 19.0–186.0)
Retic Ct Pct: 0.9 % (ref 0.4–3.1)
Reticulocyte Hemoglobin: 27.9 pg — ABNORMAL LOW (ref >27.9)

## 2023-12-03 LAB — HEPATITIS B SURFACE ANTIGEN: Hepatitis B Surface Ag: NONREACTIVE

## 2023-12-03 LAB — HIV ANTIBODY (ROUTINE TESTING W REFLEX): HIV Screen 4th Generation wRfx: NONREACTIVE

## 2023-12-03 LAB — HEPATITIS B SURFACE ANTIBODY,QUALITATIVE: Hep B S Ab: REACTIVE — AB

## 2023-12-03 NOTE — Progress Notes (Unsigned)
 Hematology and Oncology Follow Up Visit  Olivia Morales [id number] 04-26-1951 73 y.o. 12/03/2023  Past Medical History:  Diagnosis Date   Diabetes mellitus    Hypercholesteremia    Hypertension    Sickle cell trait (HCC)    Sleep apnea     Principle Diagnosis:  Anemia in the setting of CKD  Current Therapy:   Planning to start Aranesp 300 mg Q 3 weeks for Hgb <11     Interim History:  Ms. Barnard is back for follow-up for anemia: She is here with her husband.   She states that she is tired and frustrated. She has been through so much in the past few months in terms of her health. She just wants to feel better. She continues to be seen by multiple specialists including nephrology, cardiology and her PCP.   So far in her work up she has had no signs of iron, B12, deficiency. 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 She has had a scantly elevated sed rate of 23. CRP 1.2. EPO level was 9.6. retic count pending.   No chest pain, SOB. She does have fatigue and generalized malaise.   There has been no bleeding to her knowledge: denies epistaxis, gingivitis, hemoptysis, hematemesis, hematuria, melena, excessive bruising, blood donation.    Wt Readings from Last 3 Encounters:  12/03/23 145 lb 12.8 oz (66.1 kg)  11/29/23 146 lb (66.2 kg)  11/12/23 150 lb (68 kg)     Medications:   Current Outpatient Medications:    alendronate  (FOSAMAX ) 10 MG tablet, Take 10 mg by mouth daily before breakfast. Take with a full glass of water on an empty stomach., Disp: , Rfl:    aspirin EC 81 MG tablet, Take 81 mg by mouth daily. Swallow whole., Disp: , Rfl:    atorvastatin  (LIPITOR) 40 MG tablet, TAKE 1 TABLET BY MOUTH DAILY, Disp: 100 tablet, Rfl: 2   Cyanocobalamin  (VITAMIN B-12 PO), Take 1 tablet by mouth once a week., Disp: , Rfl:    empagliflozin (JARDIANCE) 10 MG TABS tablet, Take 10 mg by mouth daily., Disp: , Rfl:    ferrous sulfate 325 (65 FE) MG tablet, Take 325 mg by mouth  daily with breakfast., Disp: , Rfl:    furosemide (LASIX) 40 MG tablet, Take 40 mg by mouth daily., Disp: , Rfl:    hydrALAZINE  (APRESOLINE ) 25 MG tablet, Take 3 tablets (75 mg total) by mouth 3 (three) times daily. (Patient taking differently: Take 100 mg by mouth 3 (three) times daily.), Disp: 270 tablet, Rfl: 11   Lancets (ONETOUCH DELICA PLUS LANCET33G) MISC, USE TO TEST 1-2 TIMES DAILY (Patient taking differently: 1 each by Other route daily.), Disp: 100 each, Rfl: 0   metoprolol  succinate (TOPROL -XL) 50 MG 24 hr tablet, TAKE 1 TABLET BY MOUTH DAILY  WITH OR IMMEDIATELY FOLLOWING A  MEAL (Patient taking differently: Take 50 mg by mouth daily.), Disp: 100 tablet, Rfl: 2   omeprazole  (PRILOSEC) 20 MG capsule, Take 20 mg by mouth daily., Disp: , Rfl:    ONETOUCH VERIO test strip, Check blood sugar 1-2 times daily (Patient taking differently: 1 each by Other route See admin instructions. Check blood sugar 1-2 times daily), Disp: 100 each, Rfl: 12   potassium chloride  (MICRO-K ) 10 MEQ CR capsule, Take 10 mEq by mouth 2 (two) times daily., Disp: , Rfl:    cloNIDine  (CATAPRES ) 0.1 MG tablet, Take 1 tablet (0.1 mg total) by mouth once for 1 dose., Disp: 90 tablet, Rfl:  3   cloNIDine  (CATAPRES ) 0.1 MG tablet, Take 1 tablet (0.1 mg total) by mouth once for 1 dose., Disp: 7 tablet, Rfl: 0  Allergies:  Allergies  Allergen Reactions   Ace Inhibitors Cough    Past Medical History, Surgical history, Social history, and Family History were reviewed and updated.  Review of Systems: Review of Systems  Constitutional:  Positive for appetite change and fatigue. Negative for chills, diaphoresis, fever and unexpected weight change.  HENT:   Negative for hearing loss, mouth sores, nosebleeds and trouble swallowing.   Respiratory:  Negative for chest tightness, cough, hemoptysis, shortness of breath and wheezing.   Cardiovascular:  Negative for chest pain, leg swelling and palpitations.  Gastrointestinal:   Negative for abdominal pain, blood in stool and constipation.  Endocrine: Negative for hot flashes.  Genitourinary:  Negative for hematuria.   Skin:  Negative for rash.  Neurological:  Negative for headaches.  Hematological:  Negative for adenopathy. Does not bruise/bleed easily.  Psychiatric/Behavioral:  The patient is not nervous/anxious.      Physical Exam:  height is 5\' 4"  (1.626 m) and weight is 145 lb 12.8 oz (66.1 kg). Her oral temperature is 98.4 F (36.9 C). Her blood pressure is 145/68 (abnormal) and her pulse is 58 (abnormal). Her respiration is 19 and oxygen saturation is 100%.   Physical Exam General: NAD. Ambulating using a rolling walker  Cardiovascular: regular rate and rhythm, no peripheral edema.  Pulmonary: clear ant fields Abdomen: soft, nontender, + bowel sounds GU: no suprapubic tenderness Extremities: no edema, no joint deformities Skin: no rashes Neurological: Weakness but otherwise nonfocal   Lab Results  Component Value Date   WBC 3.7 (L) 12/03/2023   HGB 8.0 (L) 12/03/2023   HCT 24.3 (L) 12/03/2023   MCV 82.7 12/03/2023   PLT 235 12/03/2023     Chemistry      Component Value Date/Time   NA 143 12/03/2023 1432   NA 141 11/06/2023 1517   K 3.7 12/03/2023 1432   CL 111 12/03/2023 1432   CO2 22 12/03/2023 1432   BUN 21 12/03/2023 1432   BUN 19 11/06/2023 1517   CREATININE 2.19 (H) 12/03/2023 1432   CREATININE 1.53 (H) 03/03/2021 1557      Component Value Date/Time   CALCIUM  8.9 12/03/2023 1432   ALKPHOS 69 12/03/2023 1432   AST 18 12/03/2023 1432   ALT 5 12/03/2023 1432   BILITOT 0.4 12/03/2023 1432     Encounter Diagnoses  Name Primary?   Elevated sed rate Yes   Anemia, unspecified type    Elevated ferritin     Assessment and Plan- Patient is a 73 y.o. female who was referred to us  for anemia in the setting of elevated ferritin level. She has known sickle cell trait.    CBC from 11/04/2023: WBC 2.5, Hgb 9.4, MCV 85.5, Monocytes  relative, ANC 1.4, platelets 243. Creatinine 2.20, normal LFTs. She has a history of elevated ferritin levels likely secondary to inflammation with mildly elevated CRP and Sed rate. UPEP pending however thus far her MM work up has reflected her chronic health conditions with no evidence of a M-spike. She has a history of low EPO levels with elevated creatinine.   We discussed the possibly utility of ESA in the setting of anemia secondary to CKD. We discussed how this is administered along with risks and benefits. She has no history of MI/stroke. She does have essential hypertension and we discussed the critical importance of BP control.  She does have chronic diastolic heart failure which is likely worsened by her significant anemia. Benefits of ESA likely outweigh risks.    Disposition: RTC within 1 week for Aranesp injection RTC 1 month labs, APP, Aranesp Injection   Sunnie England PA-C 6/3/20253:51 PM

## 2023-12-04 ENCOUNTER — Encounter: Payer: Self-pay | Admitting: Hematology & Oncology

## 2023-12-04 DIAGNOSIS — D631 Anemia in chronic kidney disease: Secondary | ICD-10-CM | POA: Insufficient documentation

## 2023-12-04 LAB — HEPATITIS B CORE ANTIBODY, TOTAL: HEP B CORE AB: POSITIVE — AB

## 2023-12-05 ENCOUNTER — Other Ambulatory Visit: Payer: Self-pay | Admitting: Medical

## 2023-12-05 ENCOUNTER — Other Ambulatory Visit (HOSPITAL_BASED_OUTPATIENT_CLINIC_OR_DEPARTMENT_OTHER): Payer: Self-pay

## 2023-12-05 ENCOUNTER — Other Ambulatory Visit (HOSPITAL_COMMUNITY): Payer: Self-pay

## 2023-12-05 LAB — UPEP/UIFE/LIGHT CHAINS/TP, 24-HR UR
% BETA, Urine: 7.8 %
ALPHA 1 URINE: 3.7 %
Albumin, U: 73.9 %
Alpha 2, Urine: 4.1 %
Free Kappa Lt Chains,Ur: 113.5 mg/L — ABNORMAL HIGH (ref 1.17–86.46)
Free Kappa/Lambda Ratio: 4.92 (ref 1.83–14.26)
Free Lambda Lt Chains,Ur: 23.07 mg/L — ABNORMAL HIGH (ref 0.27–15.21)
GAMMA GLOBULIN URINE: 10.5 %
Total Protein, Urine-Ur/day: 2435 mg/(24.h) — ABNORMAL HIGH (ref 30–150)
Total Protein, Urine: 256.3 mg/dL
Total Volume: 950

## 2023-12-05 NOTE — Telephone Encounter (Signed)
Refill request for tramadol, no longer on med list. Please advise?

## 2023-12-06 ENCOUNTER — Telehealth: Payer: Self-pay | Admitting: Medical

## 2023-12-06 ENCOUNTER — Other Ambulatory Visit (HOSPITAL_BASED_OUTPATIENT_CLINIC_OR_DEPARTMENT_OTHER): Payer: Self-pay

## 2023-12-06 ENCOUNTER — Inpatient Hospital Stay

## 2023-12-06 VITALS — BP 138/59 | HR 78 | Temp 98.1°F | Resp 17

## 2023-12-06 DIAGNOSIS — N189 Chronic kidney disease, unspecified: Secondary | ICD-10-CM

## 2023-12-06 MED ORDER — TRAMADOL HCL 50 MG PO TABS
50.0000 mg | ORAL_TABLET | Freq: Three times a day (TID) | ORAL | 0 refills | Status: AC | PRN
  Filled 2023-12-06: qty 15, 5d supply, fill #0

## 2023-12-06 MED ORDER — DARBEPOETIN ALFA 300 MCG/0.6ML IJ SOSY
300.0000 ug | PREFILLED_SYRINGE | Freq: Once | INTRAMUSCULAR | Status: AC
  Administered 2023-12-06: 300 ug via SUBCUTANEOUS
  Filled 2023-12-06: qty 0.6

## 2023-12-06 NOTE — Telephone Encounter (Signed)
 Pt needs tramadol  ordered to [facility]. Please call pt when rx sent in.

## 2023-12-06 NOTE — Telephone Encounter (Signed)
Rx tramadol sent to pt pharmacy. 

## 2023-12-06 NOTE — Telephone Encounter (Signed)
 Tramadol  refilled earlier. Spoke w/ Pt- informed that Rx was sent.

## 2023-12-12 ENCOUNTER — Ambulatory Visit: Attending: Cardiology | Admitting: Cardiology

## 2023-12-12 ENCOUNTER — Encounter: Payer: Self-pay | Admitting: Cardiology

## 2023-12-12 ENCOUNTER — Telehealth: Payer: Self-pay

## 2023-12-12 VITALS — BP 156/72 | HR 75 | Ht 64.0 in | Wt 135.0 lb

## 2023-12-12 DIAGNOSIS — I350 Nonrheumatic aortic (valve) stenosis: Secondary | ICD-10-CM

## 2023-12-12 DIAGNOSIS — E1159 Type 2 diabetes mellitus with other circulatory complications: Secondary | ICD-10-CM

## 2023-12-12 DIAGNOSIS — I5032 Chronic diastolic (congestive) heart failure: Secondary | ICD-10-CM | POA: Diagnosis not present

## 2023-12-12 DIAGNOSIS — I152 Hypertension secondary to endocrine disorders: Secondary | ICD-10-CM | POA: Diagnosis not present

## 2023-12-12 NOTE — Patient Instructions (Signed)
Medication Instructions:  Your physician recommends that you continue on your current medications as directed. Please refer to the Current Medication list given to you today.  *If you need a refill on your cardiac medications before your next appointment, please call your pharmacy*   Lab Work: None Ordered If you have labs (blood work) drawn today and your tests are completely normal, you will receive your results only by: MyChart Message (if you have MyChart) OR A paper copy in the mail If you have any lab test that is abnormal or we need to change your treatment, we will call you to review the results.   Testing/Procedures: None Ordered   Follow-Up: At [facility], you and your health needs are our priority.  As part of our continuing mission to provide you with exceptional heart care, we have created designated Provider Care Teams.  These Care Teams include your primary Cardiologist (physician) and Advanced Practice Providers (APPs -  Physician Assistants and Nurse Practitioners) who all work together to provide you with the care you need, when you need it.  We recommend signing up for the patient portal called "MyChart".  Sign up information is provided on this After Visit Summary.  MyChart is used to connect with patients for Virtual Visits (Telemedicine).  Patients are able to view lab/test results, encounter notes, upcoming appointments, etc.  Non-urgent messages can be sent to your provider as well.   To learn more about what you can do with MyChart, go to [email or web address].    Your next appointment:   4 month(s)  The format for your next appointment:   In Person  Provider:   Robert Krasowski, MD    Other Instructions NA  

## 2023-12-12 NOTE — Telephone Encounter (Signed)
 Discussed with Dr. Krasowski at appt 12-12-23

## 2023-12-12 NOTE — Progress Notes (Signed)
 Cardiology Office Note:    Date:  12/12/2023   ID:  Olivia Morales, DOB 1950-09-06, MRN [id number]  PCP:  Sylvia Everts, PA-C  Cardiologist:  Ralene Burger, MD    Referring MD: Sylvia Everts, [place]   Chief Complaint  Patient presents with   Follow-up    History of Present Illness:    Olivia Morales is a 73 y.o. female past medical history significant for diastolic congestive heart failure, diabetes mellitus, chronic kidney failure, sleep apnea, essential hypertension last time of seeing him she did have significant swelling of the legs, she was given Lasix potassium that led to increased creatinine that this medication was withdrawn.  When I seen her last time I discontinue her amlodipine  I put her on Catapres /clonidine  0.1 daily.  She comes today for follow-up doing better Reasonable.  Creatinine holding around 2.  No chest pain tightness squeezing pressure burning chest.  She is moving to different house and so she is tired and exhausted but otherwise doing well  Past Medical History:  Diagnosis Date   Diabetes mellitus    Hypercholesteremia    Hypertension    Sickle cell trait (HCC)    Sleep apnea     Past Surgical History:  Procedure Laterality Date   TUBAL LIGATION      Current Medications: Current Meds  Medication Sig   alendronate  (FOSAMAX ) 10 MG tablet Take 10 mg by mouth daily before breakfast. Take with a full glass of water on an empty stomach.   aspirin EC 81 MG tablet Take 81 mg by mouth daily. Swallow whole.   atorvastatin  (LIPITOR) 40 MG tablet TAKE 1 TABLET BY MOUTH DAILY   cloNIDine  (CATAPRES ) 0.1 MG tablet Take 1 tablet (0.1 mg total) by mouth once for 1 dose.   cloNIDine  (CATAPRES ) 0.1 MG tablet Take 1 tablet (0.1 mg total) by mouth once for 1 dose.   Cyanocobalamin  (VITAMIN B-12 PO) Take 1 tablet by mouth once a week.   empagliflozin (JARDIANCE) 10 MG TABS tablet Take 10 mg by mouth daily.   ferrous sulfate 325 (65 FE) MG tablet Take  325 mg by mouth daily with breakfast.   furosemide (LASIX) 40 MG tablet Take 40 mg by mouth daily.   hydrALAZINE  (APRESOLINE ) 25 MG tablet Take 3 tablets (75 mg total) by mouth 3 (three) times daily. (Patient taking differently: Take 100 mg by mouth 3 (three) times daily.)   Lancets (ONETOUCH DELICA PLUS LANCET33G) MISC USE TO TEST 1-2 TIMES DAILY (Patient taking differently: 1 each by Other route daily.)   metoprolol  succinate (TOPROL -XL) 50 MG 24 hr tablet TAKE 1 TABLET BY MOUTH DAILY  WITH OR IMMEDIATELY FOLLOWING A  MEAL (Patient taking differently: Take 50 mg by mouth daily.)   omeprazole  (PRILOSEC) 20 MG capsule Take 20 mg by mouth daily.   ONETOUCH VERIO test strip Check blood sugar 1-2 times daily (Patient taking differently: 1 each by Other route See admin instructions. Check blood sugar 1-2 times daily)   potassium chloride  (MICRO-K ) 10 MEQ CR capsule Take 10 mEq by mouth 2 (two) times daily.   [EXPIRED] traMADol  (ULTRAM ) 50 MG tablet Take 1 tablet (50 mg total) by mouth every 8 (eight) hours as needed for up to 5 days.     Allergies:   Ace inhibitors   Social History   Socioeconomic History   Marital status: Widowed    Spouse name: Not on file   Number of children: Not on file   Years of education: Not on  file   Highest education level: Not on file  Occupational History   Not on file  Tobacco Use   Smoking status: Never   Smokeless tobacco: Never  Vaping Use   Vaping status: Never Used  Substance and Sexual Activity   Alcohol use: Yes    Comment: drinks beer on occassion   Drug use: Yes    Types: Crack cocaine    Comment: history of smoking crack ...it's been a 1.5 years   Sexual activity: Not on file  Other Topics Concern   Not on file  Social History Narrative   Not on file   Social Drivers of Health   Financial Resource Strain: Low Risk  (06/05/2021)   Overall Financial Resource Strain (CARDIA)    Difficulty of Paying Living Expenses: Not hard at all  Food  Insecurity: No Food Insecurity (11/12/2023)   Hunger Vital Sign    Worried About Running Out of Food in the Last Year: Never true    Ran Out of Food in the Last Year: Never true  Transportation Needs: No Transportation Needs (11/12/2023)   PRAPARE - Administrator, Civil Service (Medical): No    Lack of Transportation (Non-Medical): No  Physical Activity: Inactive (06/05/2021)   Exercise Vital Sign    Days of Exercise per Week: 0 days    Minutes of Exercise per Session: 0 min  Stress: No Stress Concern Present (06/05/2021)   [employer] of Occupational Health - Occupational Stress Questionnaire    Feeling of Stress : Not at all  Social Connections: Moderately Integrated (06/05/2021)   Social Connection and Isolation Panel    Frequency of Communication with Friends and Family: More than three times a week    Frequency of Social Gatherings with Friends and Family: More than three times a week    Attends Religious Services: More than 4 times per year    Active Member of [employer] or Organizations: Yes    Attends Banker Meetings: More than 4 times per year    Marital Status: Widowed     Family History: The patient's family history includes Sickle cell anemia in her father. ROS:   Please see the history of present illness.    All 14 point review of systems negative except as described per history of present illness  EKGs/Labs/Other Studies Reviewed:         Recent Labs: 08/13/2023: TSH 1.95 10/28/2023: Brain Natriuretic Peptide 123 11/04/2023: Pro B Natriuretic peptide (BNP) 395.0 12/03/2023: ALT 5; BUN 21; Creatinine 2.19; Hemoglobin 8.0; Platelet Count 235; Potassium 3.7; Sodium 143  Recent Lipid Panel    Component Value Date/Time   CHOL 152 08/02/2021 0800   TRIG 76.0 08/02/2021 0800   HDL 46.60 08/02/2021 0800   CHOLHDL 3 08/02/2021 0800   VLDL 15.2 08/02/2021 0800   LDLCALC 90 08/02/2021 0800   LDLCALC 98 03/03/2021 1557    Physical Exam:    VS:   BP (!) 160/82 (BP Location: Right Arm, Patient Position: Sitting)   Pulse 75   Ht 5' 4 (1.626 m)   Wt 135 lb (61.2 kg)   SpO2 95%   BMI 23.17 kg/m     Wt Readings from Last 3 Encounters:  12/12/23 135 lb (61.2 kg)  12/03/23 145 lb 12.8 oz (66.1 kg)  11/29/23 146 lb (66.2 kg)     GEN:  Well nourished, well developed in no acute distress HEENT: Normal NECK: No JVD; No carotid bruits LYMPHATICS: No lymphadenopathy  CARDIAC: RRR, no murmurs, no rubs, no gallops RESPIRATORY:  Clear to auscultation without rales, wheezing or rhonchi  ABDOMEN: Soft, non-tender, non-distended MUSCULOSKELETAL:  No edema; No deformity  SKIN: Warm and dry LOWER EXTREMITIES: no swelling NEUROLOGIC:  Alert and oriented x 3 PSYCHIATRIC:  Normal affect   ASSESSMENT:    1. Hypertension associated with diabetes (HCC)   2. Chronic diastolic heart failure (HCC)   3. Nonrheumatic aortic valve stenosis    PLAN:    In order of problems listed above:  Essential hypertension we will recheck blood pressure before she get out of here if blood pressure still not well-controlled we will double the dose of clonidine . She is trying to get blood pressure monitor which I think is the next idea.  She will let me know what blood pressure is holding like. Chronic diastolic congestive heart failure compensated on appropriate medications we will continue. Aortic stenosis not critical   Medication Adjustments/Labs and Tests Ordered: Current medicines are reviewed at length with the patient today.  Concerns regarding medicines are outlined above.  No orders of the defined types were placed in this encounter.  Medication changes: No orders of the defined types were placed in this encounter.   Signed, Manfred Seed, MD, [facility] 12/12/2023 1:09 PM    [facility]

## 2023-12-13 ENCOUNTER — Ambulatory Visit: Admitting: Medical

## 2023-12-13 ENCOUNTER — Encounter: Payer: Self-pay | Admitting: Medical

## 2023-12-13 VITALS — BP 128/70 | HR 70 | Ht 64.0 in | Wt 135.0 lb

## 2023-12-13 DIAGNOSIS — I10 Essential (primary) hypertension: Secondary | ICD-10-CM | POA: Diagnosis not present

## 2023-12-13 DIAGNOSIS — R634 Abnormal weight loss: Secondary | ICD-10-CM

## 2023-12-13 DIAGNOSIS — R630 Anorexia: Secondary | ICD-10-CM | POA: Diagnosis not present

## 2023-12-13 DIAGNOSIS — I509 Heart failure, unspecified: Secondary | ICD-10-CM | POA: Diagnosis not present

## 2023-12-13 DIAGNOSIS — E1165 Type 2 diabetes mellitus with hyperglycemia: Secondary | ICD-10-CM | POA: Diagnosis not present

## 2023-12-13 DIAGNOSIS — Z7984 Long term (current) use of oral hypoglycemic drugs: Secondary | ICD-10-CM

## 2023-12-13 DIAGNOSIS — R748 Abnormal levels of other serum enzymes: Secondary | ICD-10-CM

## 2023-12-13 DIAGNOSIS — N189 Chronic kidney disease, unspecified: Secondary | ICD-10-CM

## 2023-12-13 NOTE — Patient Instructions (Signed)
 Hypertension Blood pressure slightly elevated. Cardiologist confirmed no recent increase in clonidine  dosage. Discussed importance of controlling blood pressure to prevent complications. - Continue clonidine  0.1 mg once daily. - Increase hydralazine  to 100 mg three times a day if needed. - Continue metoprolol  XL 50 mg once daily. - Monitor blood pressure at home with new machine.  Chronic Kidney Disease (CKD) Scheduled nephrology follow-up for CKD management. Erythropoietin  injections for CKD-related anemia. Emphasized regular follow-up for kidney function monitoring and treatment adjustment. - Follow up with nephrologist next week. - Continue erythropoietin  injections as scheduled with hematologist.  Diabetes Mellitus Diabetes well-controlled with A1c of 5.4. Trulicity  discontinued due to elevated lipase. Discussed pancreatitis risk with certain medications. Lipase levels to be rechecked. - Continue Jardiance. - Recheck lipase levels today. - Repeat A1c in August.  Weight Loss Experienced weight loss and decreased appetite. Emphasized maintaining or increasing weight by 5-10 pounds.  - Consume 3-4 Boost or Ensure supplements daily. - Eat 2 solid meals daily. - Incorporate calorie-dense foods like peanuts into diet.(but low salt)  Follow up January 16, 2023 or sooner if needed.

## 2023-12-13 NOTE — Progress Notes (Signed)
 Subjective:    Patient ID: Olivia Morales, female    DOB: March 25, 1951, 73 y.o.   MRN: [id number]  HPI Pt in for follow up.  Pt saw cardiologist.   1. Hypertension associated with diabetes (HCC)   2. Chronic diastolic heart failure (HCC)   3. Nonrheumatic aortic valve stenosis     PLAN:     In order of problems listed above:   Essential hypertension we will recheck blood pressure before she get out of here if blood pressure still not well-controlled we will double the dose of clonidine . She is trying to get blood pressure monitor which I think is the next idea.  She will let me know what blood pressure is holding like. Chronic diastolic congestive heart failure compensated on appropriate medications we will continue. Aortic stenosis not critical   Olivia Morales is a 73 year old female with chronic kidney disease and hypertension who presents for follow-up of her chronic conditions.  She is scheduled to follow up with her nephrologist next week for her chronic kidney disease. She is currently receiving erythropoietin  injections for anemia related to her kidney disease, with the next injection scheduled for the coming Tuesday. She will be unavailable for appointments during the first two weeks of July due to visiting her mother.  Her blood pressure has been mild elevated at cardioloigst office. She is currently taking clonidine  0.1 mg once daily, hydralazine  100 mg three times a day, and metoprolol  XL 50 mg once daily. She recently acquired a new blood pressure machine as her previous one was over 82 years old and malfunctioning.  She has experienced a significant decrease in appetite and weight loss. No nausea, but she has no taste and lacks appetite. Her current diet is minimal, often skipping breakfast and having small meals later in the day. She is considering using nutritional supplements like Ensure to increase her calorie intake.  Her diabetes is currently  well-controlled with a recent A1c of 5.4. She is on Jardiance and has stopped using Trulicity  since February due to elevated lipase levels, which were monitored due to concerns about pancreatitis. She has a glucometer to monitor her blood sugar levels.  She is also on furosemide 40 mg, taking half a tablet every third day, and uses ACE wraps for edema management. She has stopped using meloxicam  and Lasix. Hx of chf but has been stable recently with no pedal edema, no dyspnea or any chest pain.     Review of Systems  Constitutional:  Positive for appetite change. Negative for chills.  HENT:  Negative for congestion.   Respiratory:  Negative for cough, chest tightness, shortness of breath and wheezing.   Cardiovascular:  Negative for chest pain and palpitations.  Gastrointestinal:  Negative for abdominal pain, blood in stool, nausea and vomiting.  Genitourinary:  Negative for dysuria, flank pain and frequency.  Musculoskeletal:  Negative for back pain and myalgias.  Skin:  Negative for rash.  Neurological:  Negative for dizziness, speech difficulty and light-headedness.  Hematological:  Negative for adenopathy. Does not bruise/bleed easily.  Psychiatric/Behavioral:  Negative for behavioral problems, confusion and decreased concentration. The patient is not nervous/anxious.     Past Medical History:  Diagnosis Date   Diabetes mellitus    Hypercholesteremia    Hypertension    Sickle cell trait (HCC)    Sleep apnea      Social History   Socioeconomic History   Marital status: Widowed    Spouse name: Not on file  Number of children: Not on file   Years of education: Not on file   Highest education level: Not on file  Occupational History   Not on file  Tobacco Use   Smoking status: Never   Smokeless tobacco: Never  Vaping Use   Vaping status: Never Used  Substance and Sexual Activity   Alcohol use: Yes    Comment: drinks beer on occassion   Drug use: Yes    Types: Crack  cocaine    Comment: history of smoking crack ...it's been a 1.5 years   Sexual activity: Not on file  Other Topics Concern   Not on file  Social History Narrative   Not on file   Social Drivers of Health   Financial Resource Strain: Low Risk  (06/05/2021)   Overall Financial Resource Strain (CARDIA)    Difficulty of Paying Living Expenses: Not hard at all  Food Insecurity: No Food Insecurity (11/12/2023)   Hunger Vital Sign    Worried About Running Out of Food in the Last Year: Never true    Ran Out of Food in the Last Year: Never true  Transportation Needs: No Transportation Needs (11/12/2023)   PRAPARE - Administrator, Civil Service (Medical): No    Lack of Transportation (Non-Medical): No  Physical Activity: Inactive (06/05/2021)   Exercise Vital Sign    Days of Exercise per Week: 0 days    Minutes of Exercise per Session: 0 min  Stress: No Stress Concern Present (06/05/2021)   [employer] of Occupational Health - Occupational Stress Questionnaire    Feeling of Stress : Not at all  Social Connections: Moderately Integrated (06/05/2021)   Social Connection and Isolation Panel    Frequency of Communication with Friends and Family: More than three times a week    Frequency of Social Gatherings with Friends and Family: More than three times a week    Attends Religious Services: More than 4 times per year    Active Member of [employer] or Organizations: Yes    Attends Banker Meetings: More than 4 times per year    Marital Status: Widowed  Intimate Partner Violence: Not At Risk (11/12/2023)   Humiliation, Afraid, Rape, and Kick questionnaire    Fear of Current or Ex-Partner: No    Emotionally Abused: No    Physically Abused: No    Sexually Abused: No    Past Surgical History:  Procedure Laterality Date   TUBAL LIGATION      Family History  Problem Relation Age of Onset   Sickle cell anemia Father     Allergies  Allergen Reactions   Ace  Inhibitors Cough    Current Outpatient Medications on File Prior to Visit  Medication Sig Dispense Refill   alendronate  (FOSAMAX ) 10 MG tablet Take 10 mg by mouth daily before breakfast. Take with a full glass of water on an empty stomach.     aspirin EC 81 MG tablet Take 81 mg by mouth daily. Swallow whole.     atorvastatin  (LIPITOR) 40 MG tablet TAKE 1 TABLET BY MOUTH DAILY 100 tablet 2   cloNIDine  (CATAPRES ) 0.1 MG tablet Take 1 tablet (0.1 mg total) by mouth once for 1 dose. 90 tablet 3   cloNIDine  (CATAPRES ) 0.1 MG tablet Take 1 tablet (0.1 mg total) by mouth once for 1 dose. 7 tablet 0   Cyanocobalamin  (VITAMIN B-12 PO) Take 1 tablet by mouth once a week.     empagliflozin (JARDIANCE) 10  MG TABS tablet Take 10 mg by mouth daily.     ferrous sulfate 325 (65 FE) MG tablet Take 325 mg by mouth daily with breakfast.     furosemide (LASIX) 40 MG tablet Take 40 mg by mouth daily.     hydrALAZINE  (APRESOLINE ) 25 MG tablet Take 3 tablets (75 mg total) by mouth 3 (three) times daily. (Patient taking differently: Take 100 mg by mouth 3 (three) times daily.) 270 tablet 11   Lancets (ONETOUCH DELICA PLUS LANCET33G) MISC USE TO TEST 1-2 TIMES DAILY (Patient taking differently: 1 each by Other route daily.) 100 each 0   metoprolol  succinate (TOPROL -XL) 50 MG 24 hr tablet TAKE 1 TABLET BY MOUTH DAILY  WITH OR IMMEDIATELY FOLLOWING A  MEAL (Patient taking differently: Take 50 mg by mouth daily.) 100 tablet 2   omeprazole  (PRILOSEC) 20 MG capsule Take 20 mg by mouth daily.     ONETOUCH VERIO test strip Check blood sugar 1-2 times daily (Patient taking differently: 1 each by Other route See admin instructions. Check blood sugar 1-2 times daily) 100 each 12   potassium chloride  (MICRO-K ) 10 MEQ CR capsule Take 10 mEq by mouth 2 (two) times daily.     No current facility-administered medications on file prior to visit.    BP 128/70   Pulse 70   Ht 5' 4 (1.626 m)   Wt 135 lb (61.2 kg)   SpO2 96%   BMI  23.17 kg/m        Objective:   Physical Exam  General Mental Status- Alert. General Appearance- Not in acute distress.   Skin General: Color- Normal Color. Moisture- Normal Moisture.  Neck Carotid Arteries- Normal color. Moisture- Normal Moisture. No carotid bruits. No JVD.  Chest and Lung Exam Auscultation: Breath Sounds:-CTA  Cardiovascular Auscultation:Rythm- RRR Murmurs & Other Heart Sounds:Auscultation of the heart reveals- No Murmurs.  Abdomen Inspection:-Inspeection Normal. Palpation/Percussion:Note:No mass. Palpation and Percussion of the abdomen reveal- Non Tender, Non Distended + BS, no rebound or guarding.   Neurologic Cranial Nerve exam:- CN III-XII intact(No nystagmus), symmetric smile. Strength:- 5/5 equal and symmetric strength both upper and lower extremities.    Lower ext-  bilaterally no pedal edema, negative homans siigns and calfs symmetric.     Assessment & Plan:   Patient Instructions  Hypertension Blood pressure slightly elevated. Cardiologist confirmed no recent increase in clonidine  dosage. Discussed importance of controlling blood pressure to prevent complications. - Continue clonidine  0.1 mg once daily. - Increase hydralazine  to 100 mg three times a day if needed. - Continue metoprolol  XL 50 mg once daily. - Monitor blood pressure at home with new machine.  Chronic Kidney Disease (CKD) Scheduled nephrology follow-up for CKD management. Erythropoietin  injections for CKD-related anemia. Emphasized regular follow-up for kidney function monitoring and treatment adjustment. - Follow up with nephrologist next week. - Continue erythropoietin  injections as scheduled with hematologist.  Diabetes Mellitus Diabetes well-controlled with A1c of 5.4. Trulicity  discontinued due to elevated lipase. Discussed pancreatitis risk with certain medications. Lipase levels to be rechecked. - Continue Jardiance. - Recheck lipase levels today. - Repeat A1c in  August.  Weight Loss Experienced weight loss and decreased appetite. Emphasized maintaining or increasing weight by 5-10 pounds.  - Consume 3-4 Boost or Ensure supplements daily. - Eat 2 solid meals daily. - Incorporate calorie-dense foods like peanuts into diet.(but low salt)  Follow up January 16, 2023 or sooner if needed.   Time spent with patient today was 41   minutes which  consisted of chart revdiew, discussing diagnosis, work up treatment and documentation.

## 2023-12-17 ENCOUNTER — Other Ambulatory Visit: Payer: Self-pay | Admitting: Medical

## 2023-12-17 ENCOUNTER — Ambulatory Visit: Admitting: Medical Oncology

## 2023-12-17 ENCOUNTER — Inpatient Hospital Stay

## 2023-12-18 ENCOUNTER — Other Ambulatory Visit

## 2023-12-18 DIAGNOSIS — R748 Abnormal levels of other serum enzymes: Secondary | ICD-10-CM

## 2023-12-18 LAB — LIPASE: Lipase: 53 U/L (ref 7–60)

## 2023-12-19 ENCOUNTER — Ambulatory Visit: Payer: Self-pay | Admitting: Medical

## 2023-12-23 ENCOUNTER — Telehealth: Payer: Self-pay | Admitting: Medical

## 2023-12-23 ENCOUNTER — Ambulatory Visit: Admitting: Medical

## 2023-12-23 NOTE — Telephone Encounter (Signed)
 Copied from CRM [id number]. Topic: Medicare AWV >> Dec 23, 2023  3:34 PM Nathanel DEL wrote: Reason for CRM: LVM 12/23/2023 to change AWV appt time 12/26/2023 from 10:10 to 10:20am [facility]  Nathanel Paschal; Care Guide Ambulatory Clinical Support Champion [facility] Direct Dial: [phone]

## 2023-12-26 ENCOUNTER — Ambulatory Visit

## 2023-12-26 VITALS — Ht 64.0 in | Wt 134.0 lb

## 2023-12-26 DIAGNOSIS — Z0000 Encounter for general adult medical examination without abnormal findings: Secondary | ICD-10-CM | POA: Diagnosis not present

## 2023-12-26 NOTE — Patient Instructions (Signed)
 Ms. Labuda , Thank you for taking time out of your busy schedule to complete your Annual Wellness Visit with me. I enjoyed our conversation and look forward to speaking with you again next year. I, as well as your care team,  appreciate your ongoing commitment to your health goals. Please review the following plan we discussed and let me know if I can assist you in the future. Your Game plan/ To Do List     Follow up Visits: Next Medicare AWV with our clinical staff: please call [phone] to schedule your next visit on or after 12/25/24.    Next Office Visit with your provider: 01/15/24 2pm  Clinician Recommendations:  Aim for 30 minutes of exercise or brisk walking, 6-8 glasses of water, and 5 servings of fruits and vegetables each day.       This is a list of the screening recommended for you and due dates:  Health Maintenance  Topic Date Due   Complete foot exam   04/27/2021   Eye exam for diabetics  03/30/2023   Medicare Annual Wellness Visit  08/24/2023   COVID-19 Vaccine (2 - 2024-25 season) 12/25/2024*   Flu Shot  01/31/2024   Hemoglobin A1C  04/29/2024   Yearly kidney health urinalysis for diabetes  06/06/2024   Yearly kidney function blood test for diabetes  12/02/2024   Mammogram  08/28/2025   Colon Cancer Screening  05/16/2026   DTaP/Tdap/Td vaccine (3 - Td or Tdap) 06/07/2026   Pneumococcal Vaccine for age over 42  Completed   DEXA scan (bone density measurement)  Completed   Hepatitis C Screening  Completed   Zoster (Shingles) Vaccine  Completed   Hepatitis B Vaccine  Aged Out   HPV Vaccine  Aged Out   Meningitis B Vaccine  Aged Out  *Topic was postponed. The date shown is not the original due date.    Advance Care Planning is important because it:  [x]  Makes sure you receive the medical care that is consistent with your values, goals, and preferences  [x]  It provides guidance to your family and loved ones and reduces their decisional burden about whether  or not they are making the right decisions based on your wishes.  Follow the link provided in your after visit summary or read over the paperwork we have mailed to you to help you started getting your Advance Directives in place. If you need assistance in completing these, please reach out to us  so that we can help you!  See attachments for Preventive Care and Fall Prevention Tips.

## 2023-12-26 NOTE — Progress Notes (Signed)
 Subjective:   Olivia Morales is a 73 y.o. who presents for a Medicare Wellness preventive visit.  As a reminder, Annual Wellness Visits don't include a physical exam, and some assessments may be limited, especially if this visit is performed virtually. We may recommend an in-person follow-up visit with your provider if needed.  Visit Complete: Virtual I connected with  Olivia Morales on 12/26/23 by a audio enabled telemedicine application and verified that I am speaking with the correct person using two identifiers.  Patient Location: Home  Provider Location: Office/Clinic  I discussed the limitations of evaluation and management by telemedicine. The patient expressed understanding and agreed to proceed.  Vital Signs: Because this visit was a virtual/telehealth visit, some criteria may be missing or patient reported. Any vitals not documented were not able to be obtained and vitals that have been documented are patient reported.  VideoDeclined- This patient declined Librarian, academic. Therefore the visit was completed with audio only.  Persons Participating in Visit: Patient.  AWV Questionnaire: No: Patient Medicare AWV questionnaire was not completed prior to this visit.  Cardiac Risk Factors include: advanced age (>82men, >78 women);diabetes mellitus;hypertension;Other (see comment), Risk factor comments: CHF, OSA, CKD     Objective:    Today's Vitals   12/26/23 0944  Weight: 134 lb (60.8 kg)  Height: 5' 4 (1.626 m)   Body mass index is 23 kg/m.     12/26/2023   10:13 AM 12/03/2023    3:02 PM 11/12/2023   12:50 PM 08/23/2022   11:12 AM 06/05/2021    9:49 AM 10/18/2020    6:43 PM 12/31/2019   12:54 PM  Advanced Directives  Does Patient Have a Medical Advance Directive? No No No No No No No  Would patient like information on creating a medical advance directive? No - Patient declined No - Patient declined No - Patient declined No -  Patient declined Yes (MAU/Ambulatory/Procedural Areas - Information given) No - Patient declined     Current Medications (verified) Outpatient Encounter Medications as of 12/26/2023  Medication Sig   alendronate  (FOSAMAX ) 10 MG tablet Take 10 mg by mouth daily before breakfast. Take with a full glass of water on an empty stomach.   aspirin EC 81 MG tablet Take 81 mg by mouth daily. Swallow whole.   atorvastatin  (LIPITOR) 40 MG tablet Take 1 tablet (40 mg total) by mouth daily.   cloNIDine  (CATAPRES ) 0.1 MG tablet Take 1 tablet (0.1 mg total) by mouth once for 1 dose.   Cyanocobalamin  (VITAMIN B-12 PO) Take 1 tablet by mouth once a week.   empagliflozin (JARDIANCE) 10 MG TABS tablet Take 10 mg by mouth daily.   ferrous sulfate 325 (65 FE) MG tablet Take 325 mg by mouth daily with breakfast.   furosemide (LASIX) 40 MG tablet Take 40 mg by mouth daily.   hydrALAZINE  (APRESOLINE ) 25 MG tablet Take 3 tablets (75 mg total) by mouth 3 (three) times daily. (Patient taking differently: Take 100 mg by mouth 3 (three) times daily.)   Lancets (ONETOUCH DELICA PLUS LANCET33G) MISC USE TO TEST 1-2 TIMES DAILY (Patient taking differently: 1 each by Other route daily.)   metoprolol  succinate (TOPROL -XL) 50 MG 24 hr tablet Take 1 tablet (50 mg total) by mouth daily. Take with or immediately following a meal   omeprazole  (PRILOSEC) 20 MG capsule Take 20 mg by mouth daily.   ONETOUCH VERIO test strip Check blood sugar 1-2 times daily (Patient taking differently: 1 each  by Other route See admin instructions. Check blood sugar 1-2 times daily)   potassium chloride  (MICRO-K ) 10 MEQ CR capsule Take 10 mEq by mouth 2 (two) times daily.   No facility-administered encounter medications on file as of 12/26/2023.    Allergies (verified) Ace inhibitors   History: Past Medical History:  Diagnosis Date   Diabetes mellitus    Hypercholesteremia    Hypertension    Sickle cell trait (HCC)    Sleep apnea    Past  Surgical History:  Procedure Laterality Date   TUBAL LIGATION     Family History  Problem Relation Age of Onset   Sickle cell anemia Father    Social History   Socioeconomic History   Marital status: Widowed    Spouse name: Not on file   Number of children: Not on file   Years of education: Not on file   Highest education level: Not on file  Occupational History   Not on file  Tobacco Use   Smoking status: Never   Smokeless tobacco: Never  Vaping Use   Vaping status: Never Used  Substance and Sexual Activity   Alcohol use: Yes    Comment: drinks beer on occassion   Drug use: Not Currently    Types: Crack cocaine    Comment: history of smoking crack ...it's been a 1.5 years   Sexual activity: Not on file  Other Topics Concern   Not on file  Social History Narrative   Not on file   Social Drivers of Health   Financial Resource Strain: Low Risk  (12/26/2023)   Overall Financial Resource Strain (CARDIA)    Difficulty of Paying Living Expenses: Not hard at all  Food Insecurity: No Food Insecurity (12/26/2023)   Hunger Vital Sign    Worried About Running Out of Food in the Last Year: Never true    Ran Out of Food in the Last Year: Never true  Transportation Needs: No Transportation Needs (12/26/2023)   PRAPARE - Administrator, Civil Service (Medical): No    Lack of Transportation (Non-Medical): No  Physical Activity: Inactive (12/26/2023)   Exercise Vital Sign    Days of Exercise per Week: 0 days    Minutes of Exercise per Session: 0 min  Stress: No Stress Concern Present (12/26/2023)   [employer] of Occupational Health - Occupational Stress Questionnaire    Feeling of Stress: Not at all  Social Connections: Moderately Isolated (12/26/2023)   Social Connection and Isolation Panel    Frequency of Communication with Friends and Family: More than three times a week    Frequency of Social Gatherings with Friends and Family: Not on file    Attends  Religious Services: More than 4 times per year    Active Member of [employer] or Organizations: No    Attends Banker Meetings: Never    Marital Status: Widowed    Tobacco Counseling Counseling given: Not Answered    Clinical Intake:  Pre-visit preparation completed: Yes        BMI - recorded: 23 Nutritional Status: BMI of 19-24  Normal Nutritional Risks: None Diabetes: Yes  Lab Results  Component Value Date   HGBA1C 5.4 10/29/2023   HGBA1C 5.8 07/26/2023   HGBA1C 5.9 12/11/2022     How often do you need to have someone help you when you read instructions, pamphlets, or other written materials from your doctor or pharmacy?: 1 - Never  Interpreter Needed?: No  Information entered  by :: Lolita Libra, CMA   Activities of Daily Living     12/26/2023    9:52 AM  In your present state of health, do you have any difficulty performing the following activities:  Hearing? 0  Vision? 0  Comment .tfeye  Difficulty concentrating or making decisions? 0  Walking or climbing stairs? 0  Dressing or bathing? 0  Doing errands, shopping? 0  Preparing Food and eating ? N  Using the Toilet? N  In the past six months, have you accidently leaked urine? Y  Comment Wears depends, pt's choice, Dr is aware  Do you have problems with loss of bowel control? N  Managing your Medications? N  Managing your Finances? N  Housekeeping or managing your Housekeeping? N    Patient Care Team: Saguier, Edward, PA-C as PCP - General (Internal Medicine) Harrietta Kurtz, OD (Optometry)  I have updated your Care Teams any recent Medical Services you may have received from other providers in the past year.     Assessment:   This is a routine wellness examination for Olivia Morales.  Hearing/Vision screen Hearing Screening - Comments:: Denies hearing difficulties.  Vision Screening - Comments:: Wears RX glasses -- up to date with routine DM eye exams per pt. Copy requested from Dr  Harrietta.    Goals Addressed             This Visit's Progress    Patient Stated   Not on track    Increase exercise       Depression Screen     12/26/2023    9:59 AM 11/12/2023    1:07 PM 08/23/2022   11:11 AM 08/07/2022    9:28 AM 06/05/2021    9:54 AM 05/06/2018    8:50 AM 04/29/2018    8:08 AM  PHQ 2/9 Scores  PHQ - 2 Score 0 0 0 0 0 0 0    Fall Risk     12/26/2023    9:52 AM 08/23/2022   11:11 AM 08/07/2022    9:28 AM 06/05/2021    9:51 AM 10/27/2020    9:51 AM  Fall Risk   Falls in the past year? 0 0 0 0 0  Number falls in past yr: 0 0 0 0 0  Injury with Fall? 0 0 0 0 0  Risk for fall due to : Impaired balance/gait No Fall Risks No Fall Risks    Follow up Falls evaluation completed Falls evaluation completed  Falls prevention discussed       Data saved with a previous flowsheet row definition    MEDICARE RISK AT HOME:  Medicare Risk at Home Any stairs in or around the home?: Yes If so, are there any without handrails?: No Home free of loose throw rugs in walkways, pet beds, electrical cords, etc?: Yes Adequate lighting in your home to reduce risk of falls?: Yes Life alert?: No Use of a cane, walker or w/c?: Yes (cane, when unsteady on feet) Grab bars in the bathroom?: Yes Shower chair or bench in shower?: No Elevated toilet seat or a handicapped toilet?: No  TIMED UP AND GO:  Was the test performed?  No,audio  Cognitive Function: 6CIT completed        12/26/2023   10:01 AM 08/23/2022   11:23 AM  6CIT Screen  What Year? 0 points 0 points  What month? 0 points 0 points  What time? 0 points 0 points  Count back from 20 0 points 0 points  Months in reverse 0 points 0 points  Repeat phrase 2 points 0 points  Total Score 2 points 0 points    Immunizations Immunization History  Administered Date(s) Administered   Fluad Quad(high Dose 65+) 09/10/2017, 04/23/2019, 04/27/2020, 07/11/2021   Fluad Trivalent(High Dose 65+) 02/27/2023   Influenza, High  Dose Seasonal PF 06/01/2014, 09/10/2017, 04/01/2018   Influenza, Seasonal, Injecte, Preservative Fre 04/01/2016   Influenza,trivalent, recombinat, inj, PF 05/26/2012, 04/13/2013   Moderna Covid-19 Vaccine  Bivalent Booster 74yrs & up 07/11/2021   Moderna SARS-COV2 Booster Vaccination 10/27/2020, 12/15/2020   PPD Test 07/24/2019, 08/28/2019, 09/22/2019   Pneumococcal Conjugate Pcv21, Polysaccharide Crm197 Conjugaf 07/31/2023   Pneumococcal Conjugate-13 09/10/2017, 04/01/2018   Pneumococcal Polysaccharide-23 08/26/2012, 01/19/2020   Tdap 01/07/2006, 06/07/2016   Zoster Recombinant(Shingrix ) 04/10/2018, 02/27/2023, 06/10/2023    Screening Tests Health Maintenance  Topic Date Due   FOOT EXAM  04/27/2021   OPHTHALMOLOGY EXAM  03/30/2023   Medicare Annual Wellness (AWV)  08/24/2023   COVID-19 Vaccine (2 - 2024-25 season) 12/25/2024 (Originally 03/03/2023)   INFLUENZA VACCINE  01/31/2024   HEMOGLOBIN A1C  04/29/2024   Diabetic kidney evaluation - Urine ACR  06/06/2024   Diabetic kidney evaluation - eGFR measurement  12/02/2024   MAMMOGRAM  08/28/2025   Colonoscopy  05/16/2026   DTaP/Tdap/Td (3 - Td or Tdap) 06/07/2026   Pneumococcal Vaccine: 50+ Years  Completed   DEXA SCAN  Completed   Hepatitis C Screening  Completed   Zoster Vaccines- Shingrix   Completed   Hepatitis B Vaccines  Aged Out   HPV VACCINES  Aged Out   Meningococcal B Vaccine  Aged Out    Health Maintenance  Health Maintenance Due  Topic Date Due   FOOT EXAM  04/27/2021   OPHTHALMOLOGY EXAM  03/30/2023   Medicare Annual Wellness (AWV)  08/24/2023   Health Maintenance Items Addressed: Request for most recent DM exam sent to Dr Harrietta. Will need foot exam at upcoming visit in July. Declines COVID vaccine at present and will get at pharmacy if she changes her mind.  Additional Screening:  Vision Screening: Recommended annual ophthalmology exams for early detection of glaucoma and other disorders of the eye. Would  you like a referral to an eye doctor? No    Dental Screening: Recommended annual dental exams for proper oral hygiene  Community Resource Referral / Chronic Care Management: CRR required this visit?  No   CCM required this visit?  No   Plan:    I have personally reviewed and noted the following in the patient's chart:   Medical and social history Use of alcohol, tobacco or illicit drugs  Current medications and supplements including opioid prescriptions. Patient is not currently taking opioid prescriptions. Functional ability and status Nutritional status Physical activity Advanced directives List of other physicians Hospitalizations, surgeries, and ER visits in previous 12 months Vitals Screenings to include cognitive, depression, and falls Referrals and appointments  In addition, I have reviewed and discussed with patient certain preventive protocols, quality metrics, and best practice recommendations. A written personalized care plan for preventive services as well as general preventive health recommendations were provided to patient.   Lolita Libra, CMA   12/26/2023   After Visit Summary: (MyChart) Due to this being a telephonic visit, the after visit summary with patients personalized plan was offered to patient via MyChart   Notes: Nothing significant to report at this time.

## 2023-12-28 ENCOUNTER — Encounter (HOSPITAL_BASED_OUTPATIENT_CLINIC_OR_DEPARTMENT_OTHER): Payer: Self-pay | Admitting: Emergency Medicine

## 2023-12-28 ENCOUNTER — Emergency Department (HOSPITAL_BASED_OUTPATIENT_CLINIC_OR_DEPARTMENT_OTHER)
Admission: EM | Admit: 2023-12-28 | Discharge: 2023-12-28 | Disposition: A | Discharge status: Home / Self Care | Attending: Emergency Medicine | Admitting: Emergency Medicine

## 2023-12-28 ENCOUNTER — Other Ambulatory Visit: Payer: Self-pay

## 2023-12-28 DIAGNOSIS — I5030 Unspecified diastolic (congestive) heart failure: Secondary | ICD-10-CM | POA: Diagnosis not present

## 2023-12-28 DIAGNOSIS — M436 Torticollis: Secondary | ICD-10-CM | POA: Insufficient documentation

## 2023-12-28 DIAGNOSIS — I110 Hypertensive heart disease with heart failure: Secondary | ICD-10-CM | POA: Diagnosis not present

## 2023-12-28 DIAGNOSIS — Z79899 Other long term (current) drug therapy: Secondary | ICD-10-CM | POA: Diagnosis not present

## 2023-12-28 DIAGNOSIS — Z7982 Long term (current) use of aspirin: Secondary | ICD-10-CM | POA: Insufficient documentation

## 2023-12-28 DIAGNOSIS — M542 Cervicalgia: Secondary | ICD-10-CM | POA: Diagnosis present

## 2023-12-28 HISTORY — DX: Heart failure, unspecified: I50.9

## 2023-12-28 MED ORDER — IBUPROFEN 600 MG PO TABS: 600.0000 mg | ORAL_TABLET | Freq: Four times a day (QID) | ORAL | 0 refills | Status: DC | PRN

## 2023-12-28 MED ORDER — DIAZEPAM 5 MG PO TABS
5.0000 mg | ORAL_TABLET | Freq: Once | ORAL | Status: AC
  Administered 2023-12-28: 5 mg via ORAL
  Filled 2023-12-28: qty 1

## 2023-12-28 MED ORDER — LIDOCAINE 5 % EX PTCH
1.0000 | MEDICATED_PATCH | CUTANEOUS | Status: DC
Start: 2023-12-28 — End: 2023-12-28
  Administered 2023-12-28: 1 via TRANSDERMAL
  Filled 2023-12-28: qty 1

## 2023-12-28 MED ORDER — KETOROLAC TROMETHAMINE 15 MG/ML IJ SOLN
15.0000 mg | Freq: Once | INTRAMUSCULAR | Status: AC
  Administered 2023-12-28: 15 mg via INTRAMUSCULAR
  Filled 2023-12-28: qty 1

## 2023-12-28 MED ORDER — LIDOCAINE 5 % EX PTCH
1.0000 | MEDICATED_PATCH | CUTANEOUS | 0 refills | Status: AC
Start: 2023-12-28 — End: ?

## 2023-12-28 MED ORDER — DIAZEPAM 5 MG PO TABS
5.0000 mg | ORAL_TABLET | Freq: Two times a day (BID) | ORAL | 0 refills | Status: DC
Start: 2023-12-28 — End: 2024-03-18

## 2023-12-28 NOTE — ED Triage Notes (Signed)
 Pt woke up yesterday with a crook in my neck.  Pt c/o pain and stiffness on the left side of her neck.  Denies weakness, denies numbness, no facial droop.  Pt applied a heating pad which helped some.

## 2023-12-28 NOTE — ED Provider Notes (Signed)
 [facility] Provider Note   CSN: [id number] Arrival date & time: 12/28/23  9153     Patient presents with: Torticollis   Olivia Morales is a 73 y.o. female patient with history of hypertension and diastolic heart failure who presents to the emergency department today with left-sided neck pain that started yesterday.  Patient states she has been lying in the bed for the last 3 days as she has been real fatigued.  She states that she might of slept funny and she woke up yesterday morning and she could not look to the left.  Pain radiates to the left paracervical musculature down into the left trapezius muscle.  She denies any focal weakness, focal numbness, visual disturbances, dizziness, lightheadedness, or syncope.  No chest pain or shortness of breath.   HPI     Prior to Admission medications   Medication Sig Start Date End Date Taking? Authorizing Provider  diazepam  (VALIUM ) 5 MG tablet Take 1 tablet (5 mg total) by mouth 2 (two) times daily. 12/28/23  Yes Olivia, Seng Fouts M, PA-C  ibuprofen  (ADVIL ) 600 MG tablet Take 1 tablet (600 mg total) by mouth every 6 (six) hours as needed. 12/28/23  Yes Abigail Marsiglia M, PA-C  lidocaine  (LIDODERM ) 5 % Place 1 patch onto the skin daily. Remove & Discard patch within 12 hours or as directed by MD 12/28/23  Yes Olivia, Darnesha Diloreto M, PA-C  alendronate  (FOSAMAX ) 10 MG tablet Take 10 mg by mouth daily before breakfast. Take with a full glass of water on an empty stomach.    [provider]  aspirin EC 81 MG tablet Take 81 mg by mouth daily. Swallow whole.    [provider]  atorvastatin  (LIPITOR) 40 MG tablet Take 1 tablet (40 mg total) by mouth daily. 12/17/23   Saguier, [place], PA-C  cloNIDine  (CATAPRES ) 0.1 MG tablet Take 1 tablet (0.1 mg total) by mouth once for 1 dose. 11/01/23 12/26/23  Krasowski, Robert J, MD  Cyanocobalamin  (VITAMIN B-12 PO) Take 1 tablet by mouth once a week.     [provider]  empagliflozin (JARDIANCE) 10 MG TABS tablet Take 10 mg by mouth daily. 10/18/23 10/13/24  [provider]  ferrous sulfate 325 (65 FE) MG tablet Take 325 mg by mouth daily with breakfast. 10/16/23   [provider]  furosemide (LASIX) 40 MG tablet Take 40 mg by mouth daily. 10/16/23   [provider]  hydrALAZINE  (APRESOLINE ) 25 MG tablet Take 3 tablets (75 mg total) by mouth 3 (three) times daily. Patient taking differently: Take 100 mg by mouth 3 (three) times daily. 10/10/23   Saguier, [place], PA-C  Lancets (ONETOUCH DELICA PLUS LANCET33G) MISC USE TO TEST 1-2 TIMES DAILY Patient taking differently: 1 each by Other route daily. 07/28/18   Saguier, [place], PA-C  metoprolol  succinate (TOPROL -XL) 50 MG 24 hr tablet Take 1 tablet (50 mg total) by mouth daily. Take with or immediately following a meal 12/17/23   Saguier, [place], PA-C  omeprazole  (PRILOSEC) 20 MG capsule Take 20 mg by mouth daily.    [provider]  [facility] VERIO test strip Check blood sugar 1-2 times daily Patient taking differently: 1 each by Other route See admin instructions. Check blood sugar 1-2 times daily 05/28/18   Saguier, [place], PA-C  potassium chloride  (MICRO-K ) 10 MEQ CR capsule Take 10 mEq by mouth 2 (two) times daily. 10/16/23   [provider]    Allergies: Ace inhibitors  Review of Systems  All other systems reviewed and are negative.   Updated Vital Signs BP (!) 205/120 (BP Location: Left Arm)   Pulse 71   Temp 98 F (36.7 C) (Oral)   Resp 16   Ht 5' 4 (1.626 m)   Wt 61.7 kg   SpO2 99%   BMI 23.34 kg/m   Physical Exam Vitals and nursing note reviewed.  Constitutional:      Appearance: Normal appearance.  HENT:     Head: Normocephalic and atraumatic.   Eyes:     General:        Right eye: No discharge.        Left eye: No discharge.     Conjunctiva/sclera: Conjunctivae normal.   Neck:     Comments: No midline cervical  tenderness.  There is palpable spasm over the left paracervical musculature and left trapezius muscles.Pulmonary:     Effort: Pulmonary effort is normal.   Skin:    General: Skin is warm and dry.     Findings: No rash.   Neurological:     General: No focal deficit present.     Mental Status: She is alert.     Comments: No focal weakness or numbness.  5/5 strength to the upper extremities.  Normal sensation to the upper extremities.  Psychiatric:        Mood and Affect: Mood normal.        Behavior: Behavior normal.     (all labs ordered are listed, but only abnormal results are displayed) Labs Reviewed - No data to display  EKG: None  Radiology: No results found.   Procedures   Medications Ordered in the ED  lidocaine  (LIDODERM ) 5 % 1 patch (1 patch Transdermal Patch Applied 12/28/23 0931)  diazepam  (VALIUM ) tablet 5 mg (5 mg Oral Given 12/28/23 0933)  ketorolac  (TORADOL ) 15 MG/ML injection 15 mg (15 mg Intramuscular Given 12/28/23 0933)     Medical Decision Making [name] is a 73 y.o. female patient who presents to the emergency department today for further evaluation of neck pain.  I do favor torticollis.  Low suspicion for stroke, bony problem, carotid dissection, temporal arteritis.  Will treat conservatively with Toradol , Valium , and Lidoderm  patch.  Patient feeling slightly better after medication regimen.  Will prescribe these for her to take in the outpatient setting.  Strict turn precautions were discussed.  She is safe for discharge.  We discussed red flag symptoms and she expressed full understanding.  Risk Prescription drug management.     Final diagnoses:  Torticollis    ED Discharge Orders          Ordered    diazepam  (VALIUM ) 5 MG tablet  2 times daily        12/28/23 1118    lidocaine  (LIDODERM ) 5 %  Every 24 hours        12/28/23 1119    ibuprofen  (ADVIL ) 600 MG tablet  Every 6 hours PRN        12/28/23 1119                Olivia Morales, [place] 12/28/23 1121    Mannie Pac T, DO 01/04/24 1505

## 2023-12-28 NOTE — Discharge Instructions (Addendum)
 As we discussed, this is likely from lying in bed for the last 3 days.  I have sent 3 medication to your pharmacy.  You can take these as prescribed.  I would not operate heavy machinery while you take the Valium as this can make you drowsy.  Do not mix this with alcohol.  Have also given you resources for our behavioral health urgent care.  You can follow-up within any time as they are open 24/7.  You may return to the emergency department for any worsening symptoms.

## 2023-12-30 ENCOUNTER — Other Ambulatory Visit: Payer: Self-pay | Admitting: Medical Oncology

## 2023-12-30 ENCOUNTER — Inpatient Hospital Stay

## 2023-12-30 ENCOUNTER — Inpatient Hospital Stay: Admitting: Medical Oncology

## 2023-12-30 ENCOUNTER — Encounter: Payer: Self-pay | Admitting: Medical Oncology

## 2023-12-30 VITALS — BP 138/63 | HR 65 | Temp 98.7°F | Resp 18 | Ht 64.0 in | Wt 129.1 lb

## 2023-12-30 DIAGNOSIS — R935 Abnormal findings on diagnostic imaging of other abdominal regions, including retroperitoneum: Secondary | ICD-10-CM

## 2023-12-30 DIAGNOSIS — D72829 Elevated white blood cell count, unspecified: Secondary | ICD-10-CM

## 2023-12-30 DIAGNOSIS — N189 Chronic kidney disease, unspecified: Secondary | ICD-10-CM

## 2023-12-30 DIAGNOSIS — R634 Abnormal weight loss: Secondary | ICD-10-CM

## 2023-12-30 DIAGNOSIS — I159 Secondary hypertension, unspecified: Secondary | ICD-10-CM

## 2023-12-30 DIAGNOSIS — D631 Anemia in chronic kidney disease: Secondary | ICD-10-CM | POA: Diagnosis not present

## 2023-12-30 LAB — CBC WITH DIFFERENTIAL (CANCER CENTER ONLY)
Abs Immature Granulocytes: 0 10*3/uL (ref 0.00–0.07)
Basophils Absolute: 0 10*3/uL (ref 0.0–0.1)
Basophils Relative: 1 %
Eosinophils Absolute: 0.1 10*3/uL (ref 0.0–0.5)
Eosinophils Relative: 3 %
HCT: 33.5 % — ABNORMAL LOW (ref 36.0–46.0)
Hemoglobin: 10.8 g/dL — ABNORMAL LOW (ref 12.0–15.0)
Immature Granulocytes: 0 %
Lymphocytes Relative: 31 %
Lymphs Abs: 0.7 10*3/uL (ref 0.7–4.0)
MCH: 26.8 pg (ref 26.0–34.0)
MCHC: 32.2 g/dL (ref 30.0–36.0)
MCV: 83.1 fL (ref 80.0–100.0)
Monocytes Absolute: 0.3 10*3/uL (ref 0.1–1.0)
Monocytes Relative: 11 %
Neutro Abs: 1.2 10*3/uL — ABNORMAL LOW (ref 1.7–7.7)
Neutrophils Relative %: 54 %
Platelet Count: 267 10*3/uL (ref 150–400)
RBC: 4.03 MIL/uL (ref 3.87–5.11)
RDW: 14.4 % (ref 11.5–15.5)
WBC Count: 2.2 10*3/uL — ABNORMAL LOW (ref 4.0–10.5)
nRBC: 0 % (ref 0.0–0.2)

## 2023-12-30 LAB — CMP (CANCER CENTER ONLY)
ALT: 7 U/L (ref 0–44)
AST: 13 U/L — ABNORMAL LOW (ref 15–41)
Albumin: 3.8 g/dL (ref 3.5–5.0)
Alkaline Phosphatase: 62 U/L (ref 38–126)
Anion gap: 9 (ref 5–15)
BUN: 27 mg/dL — ABNORMAL HIGH (ref 8–23)
CO2: 27 mmol/L (ref 22–32)
Calcium: 10.1 mg/dL (ref 8.9–10.3)
Chloride: 105 mmol/L (ref 98–111)
Creatinine: 2.66 mg/dL — ABNORMAL HIGH (ref 0.44–1.00)
GFR, Estimated: 19 mL/min — ABNORMAL LOW (ref >60)
Glucose, Bld: 112 mg/dL — ABNORMAL HIGH (ref 70–99)
Potassium: 4.2 mmol/L (ref 3.5–5.1)
Sodium: 141 mmol/L (ref 135–145)
Total Bilirubin: 0.5 mg/dL (ref 0.0–1.2)
Total Protein: 7 g/dL (ref 6.5–8.1)

## 2023-12-30 MED ORDER — DARBEPOETIN ALFA 300 MCG/0.6ML IJ SOSY
300.0000 ug | PREFILLED_SYRINGE | Freq: Once | INTRAMUSCULAR | Status: AC
  Administered 2023-12-30: 300 ug via SUBCUTANEOUS
  Filled 2023-12-30: qty 0.6

## 2023-12-30 NOTE — Patient Instructions (Signed)

## 2023-12-30 NOTE — Progress Notes (Signed)
 Hematology and Oncology Follow Up Visit  Olivia Morales [id number] 1950/10/13 73 y.o. 12/30/2023  Past Medical History:  Diagnosis Date   CHF (congestive heart failure) (HCC)    Diabetes mellitus    Hypercholesteremia    Hypertension    Sickle cell trait (HCC)    Sleep apnea     Principle Diagnosis:  Anemia in the setting of CKD  Current Therapy:   Aranesp  300 mg Q 3 weeks for Hgb <11     Interim History:  Olivia Morales is back for follow-up for anemia.   She was in the ER yesterday for hypertensive urgency and torticollis. She was given Toradol, Valium and ibuprofen . It appears that her discharged BP was 205/120. Previously 220/107.   She continues to be seen by multiple specialists including nephrology, cardiology and her PCP. She sees [facility].   So far in her work up she has had no signs of iron, B12, deficiency. 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 She has had a scantly elevated sed rate of 23. CRP 1.2. EPO level was 9.6. retic count was low at 0.9   No chest pain, SOB. She does have fatigue and generalized malaise.   There has been no bleeding to her knowledge: denies epistaxis, gingivitis, hemoptysis, hematemesis, hematuria, melena, excessive bruising, blood donation.   She continues to lose weight unintentionally. She is very concerned about  this. She reports eating really well and drinking ensure/protein drinks.  Wt Readings from Last 3 Encounters:  12/30/23 129 lb 1.3 oz (58.6 kg)  12/28/23 136 lb (61.7 kg)  12/26/23 134 lb (60.8 kg)     Medications:   Current Outpatient Medications:    alendronate  (FOSAMAX ) 10 MG tablet, Take 10 mg by mouth daily before breakfast. Take with a full glass of water on an empty stomach., Disp: , Rfl:    aspirin EC 81 MG tablet, Take 81 mg by mouth daily. Swallow whole., Disp: , Rfl:    atorvastatin  (LIPITOR) 40 MG tablet, Take 1 tablet (40 mg total) by mouth daily., Disp: 90 tablet, Rfl: 0   Cyanocobalamin   (VITAMIN B-12 PO), Take 1 tablet by mouth once a week., Disp: , Rfl:    diazepam (VALIUM) 5 MG tablet, Take 1 tablet (5 mg total) by mouth 2 (two) times daily., Disp: 10 tablet, Rfl: 0   empagliflozin (JARDIANCE) 10 MG TABS tablet, Take 10 mg by mouth daily., Disp: , Rfl:    ferrous sulfate 325 (65 FE) MG tablet, Take 325 mg by mouth daily with breakfast., Disp: , Rfl:    furosemide (LASIX) 40 MG tablet, Take 40 mg by mouth daily., Disp: , Rfl:    hydrALAZINE  (APRESOLINE ) 25 MG tablet, Take 3 tablets (75 mg total) by mouth 3 (three) times daily., Disp: 270 tablet, Rfl: 11   ibuprofen  (ADVIL ) 600 MG tablet, Take 1 tablet (600 mg total) by mouth every 6 (six) hours as needed., Disp: 30 tablet, Rfl: 0   Lancets (ONETOUCH DELICA PLUS LANCET33G) MISC, USE TO TEST 1-2 TIMES DAILY, Disp: 100 each, Rfl: 0   lidocaine (LIDODERM) 5 %, Place 1 patch onto the skin daily. Remove & Discard patch within 12 hours or as directed by MD, Disp: 30 patch, Rfl: 0   metoprolol  succinate (TOPROL -XL) 50 MG 24 hr tablet, Take 1 tablet (50 mg total) by mouth daily. Take with or immediately following a meal, Disp: 90 tablet, Rfl: 0   omeprazole  (PRILOSEC) 20 MG capsule, Take 20 mg by mouth daily.,  Disp: , Rfl:    ONETOUCH VERIO test strip, Check blood sugar 1-2 times daily, Disp: 100 each, Rfl: 12   potassium chloride  (MICRO-K ) 10 MEQ CR capsule, Take 10 mEq by mouth 2 (two) times daily., Disp: , Rfl:    cloNIDine  (CATAPRES ) 0.1 MG tablet, Take 1 tablet (0.1 mg total) by mouth once for 1 dose., Disp: 90 tablet, Rfl: 3  Allergies:  Allergies  Allergen Reactions   Ace Inhibitors Cough    Past Medical History, Surgical history, Social history, and Family History were reviewed and updated.  Review of Systems: Review of Systems  Constitutional:  Positive for fatigue and unexpected weight change. Negative for appetite change, chills, diaphoresis and fever.  HENT:   Negative for hearing loss, mouth sores, nosebleeds and  trouble swallowing.   Respiratory:  Negative for chest tightness, cough, hemoptysis, shortness of breath and wheezing.   Cardiovascular:  Negative for chest pain, leg swelling and palpitations.  Gastrointestinal:  Negative for abdominal pain, blood in stool and constipation.  Endocrine: Negative for hot flashes.  Genitourinary:  Negative for hematuria.   Skin:  Negative for rash.  Neurological:  Negative for headaches.  Hematological:  Negative for adenopathy. Does not bruise/bleed easily.  Psychiatric/Behavioral:  The patient is not nervous/anxious.      Physical Exam:  height is 5' 4 (1.626 m) and weight is 129 lb 1.3 oz (58.6 kg). Her oral temperature is 98.7 F (37.1 C). Her blood pressure is 211/90 (abnormal) and her pulse is 65. Her respiration is 18 and oxygen saturation is 100%.   Physical Exam General: NAD. Ambulating using a rolling walker  Cardiovascular: regular rate and rhythm, no peripheral edema.  Pulmonary: clear ant fields Abdomen: soft, nontender, + bowel sounds GU: no suprapubic tenderness Extremities: no edema, no joint deformities Skin: no rashes Neurological: Weakness but otherwise nonfocal   Lab Results  Component Value Date   WBC 2.2 (L) 12/30/2023   HGB 10.8 (L) 12/30/2023   HCT 33.5 (L) 12/30/2023   MCV 83.1 12/30/2023   PLT 267 12/30/2023     Chemistry      Component Value Date/Time   NA 141 12/30/2023 1140   NA 141 11/06/2023 1517   K 4.2 12/30/2023 1140   CL 105 12/30/2023 1140   CO2 27 12/30/2023 1140   BUN 27 (H) 12/30/2023 1140   BUN 19 11/06/2023 1517   CREATININE 2.66 (H) 12/30/2023 1140   CREATININE 1.53 (H) 03/03/2021 1557      Component Value Date/Time   CALCIUM  10.1 12/30/2023 1140   ALKPHOS 62 12/30/2023 1140   AST 13 (L) 12/30/2023 1140   ALT 7 12/30/2023 1140   BILITOT 0.5 12/30/2023 1140     No diagnosis found.  Assessment and Plan- Patient is a 73 y.o. female who was referred to us  for anemia in the setting of  elevated ferritin level. She has known sickle cell trait.    CBC from 11/04/2023: WBC 2.5, Hgb 9.4, MCV 85.5, Monocytes relative, ANC 1.4, platelets 243. Creatinine 2.20, normal LFTs. She has a history of elevated ferritin levels likely secondary to inflammation with mildly elevated CRP and Sed rate. UPEP pending however thus far her MM work up has reflected her chronic health conditions with no evidence of a M-spike. She has a history of low EPO levels with elevated creatinine.   Today we discussed the importance of her taking her blood pressure medications as directed to avoid hypertensive urgency and its complications. If she cannot  adhere to her BP compliance we cannot continue her Aranesp  given risks involved. We discussed these today.  Her Hgb has improved with the Aranesp  as we suspected.  WBC still down a bit- we will follow I reviewed her CT scan from 09/05/2023 which was ordered by her PCP Dallas Maxwell PA-C. Given the area of potential concern of her pancreas and her symptoms I had placed additional orders to obtain at her follow up. If symptoms continue or any of these markers are abnormal a repeat CT would certainly be appropriate.    Disposition: Aranesp  today RTC 1 month labs(CBC w/, CMP, CEA, CA 19-9, retic), APP, Aranesp  Injection (afternoon apt)   Lauraine Dais PA-C 6/30/202512:27 PM

## 2023-12-31 ENCOUNTER — Telehealth: Payer: Self-pay | Admitting: Medical

## 2023-12-31 ENCOUNTER — Inpatient Hospital Stay

## 2023-12-31 NOTE — Telephone Encounter (Signed)
 Put in new referral again to GI after reviewing hematologist note that pt continues to loose weight. I had place gi referral in February. Then when got ct abd pelvis report asked staff  CMA to communicate results to pt since she does not use my chart. Looks like message left for staff regarding ct findings and number to call to follow up with GI MD. Also I epic appear referral staff sent letter with GI MD number.  Asking  referral staff to send update referral.  I

## 2023-12-31 NOTE — Addendum Note (Signed)
 Addended by: DORINA DALLAS HERO on: 12/31/2023 11:08 AM   Modules accepted: Orders

## 2024-01-01 ENCOUNTER — Ambulatory Visit: Admitting: Medical Oncology

## 2024-01-01 ENCOUNTER — Telehealth: Payer: Self-pay | Admitting: Medical

## 2024-01-01 ENCOUNTER — Inpatient Hospital Stay

## 2024-01-01 ENCOUNTER — Ambulatory Visit: Payer: Self-pay

## 2024-01-01 ENCOUNTER — Ambulatory Visit

## 2024-01-01 NOTE — Telephone Encounter (Signed)
 I called pt back today. She states she has not been able to keep eat anything for 2 days. Has nausea. When attempts to eat gags and spits up saliva. Pt states weighs 129 lbs. I explained to her that I think she is likely dehydrated and that my concern with that is that she has ckd(very low) and dehydration could further effect her kidneys. Also explained she has history of elevated lipase. I had put referall to gi in feb. She had not attended appointment. I placed referral again yesterday after review of hematologist note and on second ct review. See that note. Will ask Chiquita to have pt follow up with me next Tuesday or wed.

## 2024-01-01 NOTE — Telephone Encounter (Signed)
 FYI Only or Action Required?: Action required by provider: update on patient condition and demands pcp call back.  Patient was last seen in primary care on 12/13/2023 by Dorina Loving, PA-C. Called Nurse Triage reporting Vomiting and pcp call. Symptoms began refused triage. Interventions attempted: Other: refused triage. Symptoms are: refused triage.  Triage Disposition: Call PCP Now  Patient/caregiver understands and will follow disposition?: No, wishes to speak with PCP  Message from [facility] C sent at 01/01/2024 12:42 PM EDT  Reason for Triage: Patient called in- cannot eat anything, hold anything down. Keeps vomiting. Patients contact is [phone].   Reason for Disposition  [1] Caller demands to speak with the PCP AND [2] about sick adult (or sick caller)    High priority message being sent to practice pool  Answer Assessment - Initial Assessment Questions 1. SITUATION:  Document reason for call.     Returning patient call re: vomiting 2. BACKGROUND: Document any background information (e.g., prior calls, known psychiatric history)     ER on 12/28/23 3. ASSESSMENT: Document your nursing assessment.     Refused triage 4. RESPONSE: Document what your response or recommendation was.     Returned call to patient re: vomiting. Patient inquiring who this writer is as she asked for Dr. Saguier to return her call, this writer explained I am triage nurse returning your call, she stated that's not what I asked for I asked for Dr. Dorina to me back', this Clinical research associate apologized and stated will send a message to Dr. Dorina requesting he call her back. Patient stated next time do what I fucking told you to do, she proceeded to hang up the phone.  Protocols used: Difficult Call-A-AH

## 2024-01-01 NOTE — Telephone Encounter (Addendum)
 I called pt this afternoon around 4:00. She states had nausea for 2 days. She had not eaten much as was gagging when she was eating.(Continues to lose weight) She state described spitting up clear liquid after eating(not able to keep food down but not vomiting). Pt describes continuing to lose weight. I advised pt that I had reviewed hematologist note and saw she had not gotten scheduled with gastroenterologist yet Although I had placed referral in February. Place referral again. Explained to pt she can call her gi and get scheduled but presently have concern her lipase may be elevated again(possible pancreatitis) as lipase had been  elevated in past. Expressed concern that she is getting dehydrated and that her baseline gfr  is low and further dehydration could effect kidneys thus be seen in ED now for evaluation and hydration. She agreed would go. This is a dupllicate note summary. I could not find earlier note typed up in epic? Sending note to RN and CMA Chiquita to update them as I won't be in office tomorrow.

## 2024-01-01 NOTE — Telephone Encounter (Signed)
 Reviewed pt ED visit note and hematologist note. Please get pt scheduled next week with me on Tuesday if possible. Pt will need 40 minute visit.

## 2024-01-01 NOTE — Telephone Encounter (Signed)
 Pt has one month follow up on 01/15/24 -- changed the time slot to 40 mins for that appt

## 2024-01-02 ENCOUNTER — Encounter

## 2024-01-02 NOTE — Telephone Encounter (Signed)
 Spoke with patient regarding symptoms.  Patient states she is feeling much better.  She reports ability to keep down peanut butter crackers, water and ensure.  Patient agrees to go to ER if symptoms return.

## 2024-01-02 NOTE — Telephone Encounter (Signed)
 Pt didn't go to ED as advised

## 2024-01-15 ENCOUNTER — Ambulatory Visit: Admitting: Medical

## 2024-01-21 ENCOUNTER — Encounter: Payer: Medicare Other | Admitting: Internal Medicine

## 2024-01-21 ENCOUNTER — Telehealth: Payer: Self-pay | Admitting: Internal Medicine

## 2024-01-21 NOTE — Telephone Encounter (Signed)
 Pt states she was in so much pain last and this morning she could make it to her appointment. Pt wants to r/s for an afternoon appointment. I advised pt it would have to be approved due to use not r/s no shows.

## 2024-01-21 NOTE — Progress Notes (Deleted)
 Office Visit Note  Patient: Olivia Morales             Date of Birth: 1950/09/30           MRN: [id number]             PCP: Dorina Dallas RIGGERS Referring: Dorina Dallas RIGGERS Visit Date: 01/21/2024 Occupation: @GUAROCC @  Subjective:  No chief complaint on file.   History of Present Illness: Olivia Morales is a 73 y.o. female ***     Activities of Daily Living:  Patient reports morning stiffness for *** {minute/hour:19697}.   Patient {ACTIONS;DENIES/REPORTS:21021675::Denies} nocturnal pain.  Difficulty dressing/grooming: {ACTIONS;DENIES/REPORTS:21021675::Denies} Difficulty climbing stairs: {ACTIONS;DENIES/REPORTS:21021675::Denies} Difficulty getting out of chair: {ACTIONS;DENIES/REPORTS:21021675::Denies} Difficulty using hands for taps, buttons, cutlery, and/or writing: {ACTIONS;DENIES/REPORTS:21021675::Denies}  No Rheumatology ROS completed.   PMFS History:  Patient Active Problem List   Diagnosis Date Noted   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  12/04/2023   Chronic diastolic heart failure (HCC) 11/01/2023   Aortic stenosis 09/20/2023   Plantar fasciitis 09/19/2023   OSA (obstructive sleep apnea) 12/21/2021   Hypercalcemia 06/15/2021   Disorder of mineral metabolism, unspecified 06/15/2021   Anemia 02/15/2021   Primary osteoarthritis of both knees 02/14/2021   Carpal tunnel syndrome, bilateral 01/05/2021   Left foot pain 01/05/2021   Left ankle injury, subsequent encounter 12/19/2017   Noncompliance with treatment 12/13/2017   Class 2 severe obesity due to excess calories with serious comorbidity and body mass index (BMI) of 35.0 to 35.9 in adult [facility]) 03/05/2017   Hypersomnia with sleep apnea 02/08/2016   Nocturia more than twice per night 02/08/2016   Noncompliance with CPAP treatment 02/08/2016   CKD (chronic kidney disease), stage III (HCC) 01/11/2014   Hyperlipidemia associated with type 2 diabetes  mellitus (HCC) 09/14/2013   Hypertension associated with diabetes (HCC) 10/31/2011   Diabetes mellitus (HCC) 10/31/2011   Benign essential hypertension 10/31/2011    Past Medical History:  Diagnosis Date   CHF (congestive heart failure) (HCC)    Diabetes mellitus    Hypercholesteremia    Hypertension    Sickle cell trait (HCC)    Sleep apnea     Family History  Problem Relation Age of Onset   Sickle cell anemia Father    Past Surgical History:  Procedure Laterality Date   TUBAL LIGATION     Social History   Social History Narrative   Not on file   Immunization History  Administered Date(s) Administered   Fluad Quad(high Dose 65+) 09/10/2017, 04/23/2019, 04/27/2020, 07/11/2021   Fluad Trivalent(High Dose 65+) 02/27/2023   Influenza, High Dose Seasonal PF 06/01/2014, 09/10/2017, 04/01/2018   Influenza, Seasonal, Injecte, Preservative Fre 04/01/2016   Influenza,trivalent, recombinat, inj, PF 05/26/2012, 04/13/2013   Moderna Covid-19 Vaccine  Bivalent Booster 85yrs & up 07/11/2021   Moderna SARS-COV2 Booster Vaccination 10/27/2020, 12/15/2020   PPD Test 07/24/2019, 08/28/2019, 09/22/2019   Pneumococcal Conjugate Pcv21, Polysaccharide Crm197 Conjugaf 07/31/2023   Pneumococcal Conjugate-13 09/10/2017, 04/01/2018   Pneumococcal Polysaccharide-23 08/26/2012, 01/19/2020   Tdap 01/07/2006, 06/07/2016   Zoster Recombinant(Shingrix ) 04/10/2018, 02/27/2023, 06/10/2023     Objective: Vital Signs: There were no vitals taken for this visit.   Physical Exam   Musculoskeletal Exam: ***  CDAI Exam: CDAI Score: -- Patient Global: --; Provider Global: -- Swollen: --; Tender: -- Joint Exam 01/21/2024   No joint exam has been documented for this visit   There is currently no information documented on the homunculus. Go to the Rheumatology activity and complete the homunculus  joint exam.  Investigation: No additional findings.  Imaging: No results found.  Recent Labs: Lab  Results  Component Value Date   WBC 2.2 (L) 12/30/2023   HGB 10.8 (L) 12/30/2023   PLT 267 12/30/2023   NA 141 12/30/2023   K 4.2 12/30/2023   CL 105 12/30/2023   CO2 27 12/30/2023   GLUCOSE 112 (H) 12/30/2023   BUN 27 (H) 12/30/2023   CREATININE 2.66 (H) 12/30/2023   BILITOT 0.5 12/30/2023   ALKPHOS 62 12/30/2023   AST 13 (L) 12/30/2023   ALT 7 12/30/2023   PROT 7.0 12/30/2023   ALBUMIN 3.8 12/30/2023   CALCIUM  10.1 12/30/2023   GFRAA 42 (L) 04/27/2020    Speciality Comments: No specialty comments available.  Procedures:  No procedures performed Allergies: Ace inhibitors   Assessment / Plan:     Visit Diagnoses: No diagnosis found.  Orders: No orders of the defined types were placed in this encounter.  No orders of the defined types were placed in this encounter.   Face-to-face time spent with patient was *** minutes. Greater than 50% of time was spent in counseling and coordination of care.  Follow-Up Instructions: No follow-ups on file.   Lonni LELON Ester, MD  Note - This record has been created using [employer].  Chart creation errors have been sought, but may not always  have been located. Such creation errors do not reflect on  the standard of medical care.

## 2024-01-30 ENCOUNTER — Inpatient Hospital Stay

## 2024-01-30 ENCOUNTER — Other Ambulatory Visit: Payer: Self-pay

## 2024-01-30 ENCOUNTER — Inpatient Hospital Stay: Admitting: Medical Oncology

## 2024-01-30 ENCOUNTER — Emergency Department (HOSPITAL_BASED_OUTPATIENT_CLINIC_OR_DEPARTMENT_OTHER)

## 2024-01-30 ENCOUNTER — Encounter (HOSPITAL_BASED_OUTPATIENT_CLINIC_OR_DEPARTMENT_OTHER): Payer: Self-pay

## 2024-01-30 ENCOUNTER — Telehealth: Payer: Self-pay | Admitting: Medical

## 2024-01-30 ENCOUNTER — Emergency Department (HOSPITAL_BASED_OUTPATIENT_CLINIC_OR_DEPARTMENT_OTHER)
Admission: EM | Admit: 2024-01-30 | Discharge: 2024-01-30 | Disposition: A | Discharge status: Home / Self Care | Source: Ambulatory Visit | Attending: Emergency Medicine | Admitting: Emergency Medicine

## 2024-01-30 VITALS — BP 226/86 | Temp 97.7°F | Resp 19 | Ht 64.0 in

## 2024-01-30 DIAGNOSIS — D631 Anemia in chronic kidney disease: Secondary | ICD-10-CM | POA: Insufficient documentation

## 2024-01-30 DIAGNOSIS — D573 Sickle-cell trait: Secondary | ICD-10-CM | POA: Insufficient documentation

## 2024-01-30 DIAGNOSIS — Z7984 Long term (current) use of oral hypoglycemic drugs: Secondary | ICD-10-CM | POA: Insufficient documentation

## 2024-01-30 DIAGNOSIS — E876 Hypokalemia: Secondary | ICD-10-CM | POA: Diagnosis not present

## 2024-01-30 DIAGNOSIS — I110 Hypertensive heart disease with heart failure: Secondary | ICD-10-CM | POA: Insufficient documentation

## 2024-01-30 DIAGNOSIS — I509 Heart failure, unspecified: Secondary | ICD-10-CM | POA: Diagnosis not present

## 2024-01-30 DIAGNOSIS — I159 Secondary hypertension, unspecified: Secondary | ICD-10-CM | POA: Diagnosis not present

## 2024-01-30 DIAGNOSIS — D72829 Elevated white blood cell count, unspecified: Secondary | ICD-10-CM

## 2024-01-30 DIAGNOSIS — R519 Headache, unspecified: Secondary | ICD-10-CM

## 2024-01-30 DIAGNOSIS — I5030 Unspecified diastolic (congestive) heart failure: Secondary | ICD-10-CM | POA: Insufficient documentation

## 2024-01-30 DIAGNOSIS — R109 Unspecified abdominal pain: Secondary | ICD-10-CM | POA: Diagnosis not present

## 2024-01-30 DIAGNOSIS — R634 Abnormal weight loss: Secondary | ICD-10-CM

## 2024-01-30 DIAGNOSIS — Z79899 Other long term (current) drug therapy: Secondary | ICD-10-CM | POA: Insufficient documentation

## 2024-01-30 DIAGNOSIS — E1122 Type 2 diabetes mellitus with diabetic chronic kidney disease: Secondary | ICD-10-CM | POA: Diagnosis not present

## 2024-01-30 DIAGNOSIS — I130 Hypertensive heart and chronic kidney disease with heart failure and stage 1 through stage 4 chronic kidney disease, or unspecified chronic kidney disease: Secondary | ICD-10-CM | POA: Diagnosis not present

## 2024-01-30 DIAGNOSIS — R7989 Other specified abnormal findings of blood chemistry: Secondary | ICD-10-CM | POA: Diagnosis present

## 2024-01-30 DIAGNOSIS — I5032 Chronic diastolic (congestive) heart failure: Secondary | ICD-10-CM | POA: Diagnosis not present

## 2024-01-30 DIAGNOSIS — G4730 Sleep apnea, unspecified: Secondary | ICD-10-CM | POA: Insufficient documentation

## 2024-01-30 DIAGNOSIS — R935 Abnormal findings on diagnostic imaging of other abdominal regions, including retroperitoneum: Secondary | ICD-10-CM | POA: Diagnosis not present

## 2024-01-30 DIAGNOSIS — Z7982 Long term (current) use of aspirin: Secondary | ICD-10-CM | POA: Diagnosis not present

## 2024-01-30 DIAGNOSIS — N189 Chronic kidney disease, unspecified: Secondary | ICD-10-CM | POA: Diagnosis not present

## 2024-01-30 DIAGNOSIS — N184 Chronic kidney disease, stage 4 (severe): Secondary | ICD-10-CM | POA: Insufficient documentation

## 2024-01-30 DIAGNOSIS — I10 Essential (primary) hypertension: Secondary | ICD-10-CM

## 2024-01-30 DIAGNOSIS — E119 Type 2 diabetes mellitus without complications: Secondary | ICD-10-CM | POA: Insufficient documentation

## 2024-01-30 DIAGNOSIS — R700 Elevated erythrocyte sedimentation rate: Secondary | ICD-10-CM

## 2024-01-30 LAB — BASIC METABOLIC PANEL WITH GFR
Anion gap: 12 (ref 5–15)
BUN: 26 mg/dL — ABNORMAL HIGH (ref 8–23)
CO2: 26 mmol/L (ref 22–32)
Calcium: 9.2 mg/dL (ref 8.9–10.3)
Chloride: 103 mmol/L (ref 98–111)
Creatinine, Ser: 2.66 mg/dL — ABNORMAL HIGH (ref 0.44–1.00)
GFR, Estimated: 19 mL/min — ABNORMAL LOW (ref >60)
Glucose, Bld: 127 mg/dL — ABNORMAL HIGH (ref 70–99)
Potassium: 3.2 mmol/L — ABNORMAL LOW (ref 3.5–5.1)
Sodium: 141 mmol/L (ref 135–145)

## 2024-01-30 LAB — CMP (CANCER CENTER ONLY)
ALT: 9 U/L (ref 0–44)
AST: 22 U/L (ref 15–41)
Albumin: 3.6 g/dL (ref 3.5–5.0)
Alkaline Phosphatase: 62 U/L (ref 38–126)
Anion gap: 12 (ref 5–15)
BUN: 26 mg/dL — ABNORMAL HIGH (ref 8–23)
CO2: 25 mmol/L (ref 22–32)
Calcium: 9.1 mg/dL (ref 8.9–10.3)
Chloride: 103 mmol/L (ref 98–111)
Creatinine: 2.44 mg/dL — ABNORMAL HIGH (ref 0.44–1.00)
GFR, Estimated: 21 mL/min — ABNORMAL LOW (ref >60)
Glucose, Bld: 84 mg/dL (ref 70–99)
Potassium: 3.9 mmol/L (ref 3.5–5.1)
Sodium: 140 mmol/L (ref 135–145)
Total Bilirubin: 0.4 mg/dL (ref 0.0–1.2)
Total Protein: 6.2 g/dL — ABNORMAL LOW (ref 6.5–8.1)

## 2024-01-30 LAB — CBC WITH DIFFERENTIAL (CANCER CENTER ONLY)
Abs Immature Granulocytes: 0 K/uL (ref 0.00–0.07)
Basophils Absolute: 0 K/uL (ref 0.0–0.1)
Basophils Relative: 2 %
Eosinophils Absolute: 0 K/uL (ref 0.0–0.5)
Eosinophils Relative: 1 %
HCT: 35.2 % — ABNORMAL LOW (ref 36.0–46.0)
Hemoglobin: 11.4 g/dL — ABNORMAL LOW (ref 12.0–15.0)
Immature Granulocytes: 0 %
Lymphocytes Relative: 36 %
Lymphs Abs: 0.9 K/uL (ref 0.7–4.0)
MCH: 26.1 pg (ref 26.0–34.0)
MCHC: 32.4 g/dL (ref 30.0–36.0)
MCV: 80.5 fL (ref 80.0–100.0)
Monocytes Absolute: 0.3 K/uL (ref 0.1–1.0)
Monocytes Relative: 13 %
Neutro Abs: 1.2 K/uL — ABNORMAL LOW (ref 1.7–7.7)
Neutrophils Relative %: 48 %
Platelet Count: 263 K/uL (ref 150–400)
RBC: 4.37 MIL/uL (ref 3.87–5.11)
RDW: 14.7 % (ref 11.5–15.5)
WBC Count: 2.5 K/uL — ABNORMAL LOW (ref 4.0–10.5)
nRBC: 0 % (ref 0.0–0.2)

## 2024-01-30 LAB — CBC
HCT: 36.3 % (ref 36.0–46.0)
Hemoglobin: 11.7 g/dL — ABNORMAL LOW (ref 12.0–15.0)
MCH: 26.1 pg (ref 26.0–34.0)
MCHC: 32.2 g/dL (ref 30.0–36.0)
MCV: 80.8 fL (ref 80.0–100.0)
Platelets: 263 K/uL (ref 150–400)
RBC: 4.49 MIL/uL (ref 3.87–5.11)
RDW: 14.9 % (ref 11.5–15.5)
WBC: 2.5 K/uL — ABNORMAL LOW (ref 4.0–10.5)
nRBC: 0 % (ref 0.0–0.2)

## 2024-01-30 LAB — SAVE SMEAR(SSMR), FOR PROVIDER SLIDE REVIEW

## 2024-01-30 LAB — RETIC PANEL
Immature Retic Fract: 1 % — ABNORMAL LOW (ref 2.3–15.9)
RBC.: 4.38 MIL/uL (ref 3.87–5.11)
Retic Count, Absolute: 15.3 K/uL — ABNORMAL LOW (ref 19.0–186.0)
Retic Ct Pct: <0.4 % — ABNORMAL LOW (ref 0.4–3.1)
Reticulocyte Hemoglobin: 29.4 pg (ref >27.9)

## 2024-01-30 LAB — CEA (ACCESS): CEA (CHCC): 1.36 ng/mL (ref 0.00–5.00)

## 2024-01-30 MED ORDER — ACETAMINOPHEN 500 MG PO TABS
1000.0000 mg | ORAL_TABLET | Freq: Once | ORAL | Status: AC
  Administered 2024-01-30: 1000 mg via ORAL
  Filled 2024-01-30: qty 2

## 2024-01-30 NOTE — ED Provider Notes (Signed)
 [place] [facility] Provider Note   CSN: [id number] Arrival date & time: 01/30/24  1702     Patient presents with: Hypertension   Olivia Morales is a 73 y.o. female.  She has a history of CHF diastolic heart failure diabetes hypertension hypercholesterolemia.  Baseline blood pressures are usually in the 150s but sometimes 200s.  She did not take her blood pressure medicine today because she was running around getting chores done.  When she went to the hematology clinic today her blood pressure was high.  She received a phone call by her primary care doctor who said she needed to come to the emergency department.  She does endorse a headache that just started in [place] hour ago.  Similar to prior headaches.  No chest pain or shortness of breath no numbness or weakness.  She also states she has not eaten yet today so she is hungry.  She has her medications with her that she would like to take.  {Add pertinent medical, surgical, social history, OB history to YEP:67052} The history is provided by the patient and the spouse.  Hypertension This is a chronic problem. The problem occurs constantly. The problem has not changed since onset.Associated symptoms include headaches. Pertinent negatives include no chest pain, no abdominal pain and no shortness of breath. Nothing aggravates the symptoms. Nothing relieves the symptoms. She has tried nothing for the symptoms. The treatment provided no relief.       Prior to Admission medications   Medication Sig Start Date End Date Taking? Authorizing Provider  alendronate  (FOSAMAX ) 10 MG tablet Take 10 mg by mouth daily before breakfast. Take with a full glass of water on an empty stomach.    [provider]  aspirin EC 81 MG tablet Take 81 mg by mouth daily. Swallow whole.    [provider]  atorvastatin  (LIPITOR) 40 MG tablet Take 1 tablet (40 mg total) by mouth daily. 12/17/23   Saguier,  [place], PA-C  cloNIDine  (CATAPRES ) 0.1 MG tablet Take 1 tablet (0.1 mg total) by mouth once for 1 dose. 11/01/23 01/30/24  Krasowski, Robert J, MD  Cyanocobalamin  (VITAMIN B-12 PO) Take 1 tablet by mouth once a week.    [provider]  diazepam  (VALIUM ) 5 MG tablet Take 1 tablet (5 mg total) by mouth 2 (two) times daily. 12/28/23   Theotis Peers M, PA-C  empagliflozin (JARDIANCE) 10 MG TABS tablet Take 10 mg by mouth daily. 10/18/23 10/13/24  [provider]  ferrous sulfate 325 (65 FE) MG tablet Take 325 mg by mouth daily with breakfast. 10/16/23   [provider]  furosemide (LASIX) 40 MG tablet Take 40 mg by mouth daily. 10/16/23   [provider]  hydrALAZINE  (APRESOLINE ) 25 MG tablet Take 3 tablets (75 mg total) by mouth 3 (three) times daily. 10/10/23   Saguier, Dallas, PA-C  ibuprofen  (ADVIL ) 600 MG tablet Take 1 tablet (600 mg total) by mouth every 6 (six) hours as needed. 12/28/23   Theotis Peers M, PA-C  Lancets (ONETOUCH DELICA PLUS [place]) MISC USE TO TEST 1-2 TIMES DAILY 07/28/18   Saguier, Dallas, PA-C  lidocaine  (LIDODERM ) 5 % Place 1 patch onto the skin daily. Remove & Discard patch within 12 hours or as directed by MD 12/28/23   Theotis Peers HERO, PA-C  metoprolol  succinate (TOPROL -XL) 50 MG 24 hr tablet Take 1 tablet (50 mg total) by mouth daily. Take with or immediately following a meal 12/17/23  Saguier, Dallas, PA-C  omeprazole  (PRILOSEC) 20 MG capsule Take 20 mg by mouth daily.    [provider]  [facility] VERIO test strip Check blood sugar 1-2 times daily 05/28/18   Saguier, Dallas, PA-C  potassium chloride  (MICRO-K ) 10 MEQ CR capsule Take 10 mEq by mouth 2 (two) times daily. 10/16/23   [provider]    Allergies: Ace inhibitors    Review of Systems  Respiratory:  Negative for shortness of breath.   Cardiovascular:  Negative for chest pain.  Gastrointestinal:  Negative for abdominal pain.  Neurological:  Positive for  headaches.    Updated Vital Signs BP (!) 224/109 (BP Location: Right Arm)   Pulse (!) 58   Temp 98.3 F (36.8 C)   Resp 18   Ht 5' 4 (1.626 m)   Wt 59 kg   SpO2 99%   BMI 22.31 kg/m   Physical Exam Vitals and nursing note reviewed.  Constitutional:      General: She is not in acute distress.    Appearance: Normal appearance. She is well-developed.  HENT:     Head: Normocephalic and atraumatic.  Eyes:     Conjunctiva/sclera: Conjunctivae normal.  Cardiovascular:     Rate and Rhythm: Normal rate and regular rhythm.     Heart sounds: No murmur heard. Pulmonary:     Effort: Pulmonary effort is normal. No respiratory distress.     Breath sounds: Normal breath sounds. No stridor. No wheezing.  Abdominal:     Palpations: Abdomen is soft.     Tenderness: There is no abdominal tenderness. There is no guarding or rebound.  Musculoskeletal:        General: No deformity.     Cervical back: Neck supple.  Skin:    General: Skin is warm and dry.  Neurological:     General: No focal deficit present.     Mental Status: She is alert.     GCS: GCS eye subscore is 4. GCS verbal subscore is 5. GCS motor subscore is 6.     Cranial Nerves: No cranial nerve deficit.     Sensory: No sensory deficit.     Motor: No weakness.     (all labs ordered are listed, but only abnormal results are displayed) Labs Reviewed  BASIC METABOLIC PANEL WITH GFR - Abnormal; Notable for the following components:      Result Value   Potassium 3.2 (*)    Glucose, Bld 127 (*)    BUN 26 (*)    Creatinine, Ser 2.66 (*)    GFR, Estimated 19 (*)    All other components within normal limits  CBC - Abnormal; Notable for the following components:   WBC 2.5 (*)    Hemoglobin 11.7 (*)    All other components within normal limits    EKG: EKG Interpretation Date/Time:  Thursday January 30 2024 17:40:29 EDT Ventricular Rate:  64 PR Interval:  124 QRS Duration:  136 QT Interval:  446 QTC Calculation: 461 R  Axis:   -80  Text Interpretation: Sinus rhythm RBBB and LAFB No significant change since prior 3/25 Confirmed by Towana Sharper [phone]) on 01/30/2024 5:47:11 PM  Radiology: DG Chest 2 View Result Date: 01/30/2024 CLINICAL DATA:  Elevated blood pressure. Patient is seen at the cancer center. EXAM: CHEST - 2 VIEW COMPARISON:  02/19/2024 FINDINGS: Heart size and pulmonary vascularity are normal. Lungs are clear. No pleural effusion or pneumothorax. Mediastinal contours appear intact. Calcified and tortuous aorta. IMPRESSION: No  active cardiopulmonary disease. Electronically Signed   By: Elsie Gravely M.D.   On: 01/30/2024 18:09    {Document cardiac monitor, telemetry assessment procedure when appropriate:32947} Procedures   Medications Ordered in the ED - No data to display    {Click here for ABCD2, HEART and other calculators REFRESH Note before signing:1}                              Medical Decision Making Amount and/or Complexity of Data Reviewed Labs: ordered. Radiology: ordered.   This patient complains of ***; this involves an extensive number of treatment Options and is a complaint that carries with it a high risk of complications and morbidity. The differential includes ***  I ordered, reviewed and interpreted labs, which included *** I ordered medication *** and reviewed PMP when indicated. I ordered imaging studies which included *** and I independently    visualized and interpreted imaging which showed *** Additional history obtained from *** Previous records obtained and reviewed *** I consulted *** and discussed lab and imaging findings and discussed disposition.  Cardiac monitoring reviewed, *** Social determinants considered, *** Critical Interventions: ***  After the interventions stated above, I reevaluated the patient and found *** Admission and further testing considered, ***   {Document critical care time when appropriate  Document review of labs and  clinical decision tools ie CHADS2VASC2, etc  Document your independent review of radiology images and any outside records  Document your discussion with family members, caretakers and with consultants  Document social determinants of health affecting pt's care  Document your decision making why or why not admission, treatments were needed:32947:::1}   Final diagnoses:  None    ED Discharge Orders     None

## 2024-01-30 NOTE — ED Triage Notes (Signed)
 Pt reports that she is here for her blood pressure being elevated. Was seen at the cancer center and was unable to get her injection today. States that her PCP advised her to come to ED for evaluation.

## 2024-01-30 NOTE — Telephone Encounter (Signed)
 I reviewed hematologist note this afternoon. Saw her hypertensive emergency values 226/86. Called pt and discussed these very high numbers. Concern for end organ damage and her kidney function numbers are low. CR 2.44 and gfr 21 She states was planning to leave out of town today at 4 pm. Advised to be seen in the ED for evaluation and to recheck bp. May need iv med to treat severe high levels. Pt agreed would go now. She state will take her bp meds first then go to ED. Will you check tomorrow morning and let me know if she went as advised.

## 2024-01-30 NOTE — Discharge Instructions (Signed)
 You were seen in the emergency department for elevated blood pressure and headache.  You took your daily blood pressure medications with improvement in your symptoms.  Your potassium was mildly low.  Please continue your medications and follow-up with your primary care doctor.  Return to the emergency department if any worsening or concerning symptoms

## 2024-01-30 NOTE — Progress Notes (Signed)
 Hematology and Oncology Follow Up Visit  Olivia Morales [id number] 1950/08/31 73 y.o. 01/30/2024  Past Medical History:  Diagnosis Date   CHF (congestive heart failure) (HCC)    Diabetes mellitus    Hypercholesteremia    Hypertension    Sickle cell trait (HCC)    Sleep apnea     Principle Diagnosis:  Anemia in the setting of CKD  Current Therapy:   Aranesp  300 mg Q 3 weeks for Hgb <11     Interim History:  Olivia Morales is back for follow-up for anemia.   She was in the ER yesterday for hypertensive urgency and torticollis. She was given Toradol , Valium  and ibuprofen . It appears that her discharged BP was 205/120. Previously 220/107.   She continues to be seen by multiple specialists including nephrology, cardiology and her PCP. She sees [facility].   So far in her work up she has had no signs of iron, B12, deficiency. 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 She has had a scantly elevated sed rate of 23. CRP 1.2. EPO level was 9.6. retic count was low at 0.9. Unfortunately she has significant compliance issues with her BP medications.   No chest pain, SOB, new peripheral edema, visual changes or current headaches.   Fatigue has improved with aranesp .   There has been no bleeding to her knowledge: denies epistaxis, gingivitis, hemoptysis, hematemesis, hematuria, melena, excessive bruising, blood donation.   She continues to lose weight unintentionally. She is very concerned about  this. She reports eating really well and drinking ensure/protein drinks.   Wt Readings from Last 3 Encounters:  12/30/23 129 lb 1.3 oz (58.6 kg)  12/28/23 136 lb (61.7 kg)  12/26/23 134 lb (60.8 kg)     Medications:   Current Outpatient Medications:    alendronate  (FOSAMAX ) 10 MG tablet, Take 10 mg by mouth daily before breakfast. Take with a full glass of water on an empty stomach., Disp: , Rfl:    aspirin EC 81 MG tablet, Take 81 mg by mouth daily. Swallow whole., Disp: ,  Rfl:    atorvastatin  (LIPITOR) 40 MG tablet, Take 1 tablet (40 mg total) by mouth daily., Disp: 90 tablet, Rfl: 0   cloNIDine  (CATAPRES ) 0.1 MG tablet, Take 1 tablet (0.1 mg total) by mouth once for 1 dose., Disp: 90 tablet, Rfl: 3   Cyanocobalamin  (VITAMIN B-12 PO), Take 1 tablet by mouth once a week., Disp: , Rfl:    diazepam  (VALIUM ) 5 MG tablet, Take 1 tablet (5 mg total) by mouth 2 (two) times daily., Disp: 10 tablet, Rfl: 0   empagliflozin (JARDIANCE) 10 MG TABS tablet, Take 10 mg by mouth daily., Disp: , Rfl:    ferrous sulfate 325 (65 FE) MG tablet, Take 325 mg by mouth daily with breakfast., Disp: , Rfl:    furosemide (LASIX) 40 MG tablet, Take 40 mg by mouth daily., Disp: , Rfl:    hydrALAZINE  (APRESOLINE ) 25 MG tablet, Take 3 tablets (75 mg total) by mouth 3 (three) times daily., Disp: 270 tablet, Rfl: 11   ibuprofen  (ADVIL ) 600 MG tablet, Take 1 tablet (600 mg total) by mouth every 6 (six) hours as needed., Disp: 30 tablet, Rfl: 0   Lancets (ONETOUCH DELICA PLUS LANCET33G) MISC, USE TO TEST 1-2 TIMES DAILY, Disp: 100 each, Rfl: 0   lidocaine  (LIDODERM ) 5 %, Place 1 patch onto the skin daily. Remove & Discard patch within 12 hours or as directed by MD, Disp: 30 patch, Rfl: 0  metoprolol  succinate (TOPROL -XL) 50 MG 24 hr tablet, Take 1 tablet (50 mg total) by mouth daily. Take with or immediately following a meal, Disp: 90 tablet, Rfl: 0   omeprazole  (PRILOSEC) 20 MG capsule, Take 20 mg by mouth daily., Disp: , Rfl:    ONETOUCH VERIO test strip, Check blood sugar 1-2 times daily, Disp: 100 each, Rfl: 12   potassium chloride  (MICRO-K ) 10 MEQ CR capsule, Take 10 mEq by mouth 2 (two) times daily., Disp: , Rfl:   Allergies:  Allergies  Allergen Reactions   Ace Inhibitors Cough    Past Medical History, Surgical history, Social history, and Family History were reviewed and updated.  Review of Systems: Review of Systems  Constitutional:  Positive for fatigue and unexpected weight  change. Negative for appetite change, chills, diaphoresis and fever.  HENT:   Negative for hearing loss, mouth sores, nosebleeds and trouble swallowing.   Respiratory:  Negative for chest tightness, cough, hemoptysis, shortness of breath and wheezing.   Cardiovascular:  Negative for chest pain, leg swelling and palpitations.  Gastrointestinal:  Negative for abdominal pain, blood in stool and constipation.  Endocrine: Negative for hot flashes.  Genitourinary:  Negative for hematuria.   Skin:  Negative for rash.  Neurological:  Negative for headaches.  Hematological:  Negative for adenopathy. Does not bruise/bleed easily.  Psychiatric/Behavioral:  The patient is not nervous/anxious.      Physical Exam:  height is 5' 4 (1.626 m). Her oral temperature is 97.7 F (36.5 C). Her respiration is 19 and oxygen saturation is 100%.   Physical Exam General: NAD.  Cardiovascular: regular rate and rhythm, Trace peripheral edema.  Pulmonary: clear ant fields Abdomen: soft, nontender, + bowel sounds GU: no suprapubic tenderness Extremities: no edema, no joint deformities Skin: no rashes Neurological: Weakness but otherwise nonfocal   Lab Results  Component Value Date   WBC 2.5 (L) 01/30/2024   HGB 11.4 (L) 01/30/2024   HCT 35.2 (L) 01/30/2024   MCV 80.5 01/30/2024   PLT 263 01/30/2024     Chemistry      Component Value Date/Time   NA 141 12/30/2023 1140   NA 141 11/06/2023 1517   K 4.2 12/30/2023 1140   CL 105 12/30/2023 1140   CO2 27 12/30/2023 1140   BUN 27 (H) 12/30/2023 1140   BUN 19 11/06/2023 1517   CREATININE 2.66 (H) 12/30/2023 1140   CREATININE 1.53 (H) 03/03/2021 1557      Component Value Date/Time   CALCIUM  10.1 12/30/2023 1140   ALKPHOS 62 12/30/2023 1140   AST 13 (L) 12/30/2023 1140   ALT 7 12/30/2023 1140   BILITOT 0.5 12/30/2023 1140     No diagnosis found.  Assessment and Plan- Patient is a 73 y.o. female who was referred to us  for anemia in the setting of  elevated ferritin level. She has known sickle cell trait.    CBC from 11/04/2023: WBC 2.5, Hgb 9.4, MCV 85.5, Monocytes relative, ANC 1.4, platelets 243. Creatinine 2.20, normal LFTs. She has a history of elevated ferritin levels likely secondary to inflammation with mildly elevated CRP and Sed rate. UPEP pending however thus far her MM work up has reflected her chronic health conditions with no evidence of a M-spike. She has a history of low EPO levels with elevated creatinine.   Today BP is significantly elevated again. We had a long conversation of the risks of uncontrolled hypertension. She will not be able to get her Aranesp  today. She declined BP recheck  or ER. She has a driver who will take her home to take her BP medications. Red flags discussed again.  No Aranesp  given BP. We will try again in 1 week.  WBC still down a bit- we will follow Additional labs pending today as follow up from a 09/05/2023 CT scan which was ordered by her PCP [place] Maxwell PA-C. Given the area of potential concern of her pancreas and her symptoms I had placed additional orders to obtain at her follow up. If symptoms continue or any of these markers are abnormal a repeat CT would certainly be appropriate.    Disposition: No Aranesp  today RTC 1 month labs(CBC w/, CMP,  retic), APP, Aranesp  Injection (afternoon apt)   Lauraine Dais PA-C 7/31/20251:59 PM

## 2024-01-31 ENCOUNTER — Telehealth: Payer: Self-pay | Admitting: Medical

## 2024-01-31 LAB — THYROID PANEL WITH TSH
Free Thyroxine Index: 2.7 (ref 1.2–4.9)
T3 Uptake Ratio: 29 % (ref 24–39)
T4, Total: 9.4 ug/dL (ref 4.5–12.0)
TSH: 1.91 u[IU]/mL (ref 0.450–4.500)

## 2024-01-31 LAB — CANCER ANTIGEN 19-9: CA 19-9: 28 U/mL (ref 0–35)

## 2024-01-31 NOTE — Telephone Encounter (Signed)
 Called pt back apt made for 02/04/2024

## 2024-01-31 NOTE — Telephone Encounter (Signed)
 Opened in error

## 2024-01-31 NOTE — Telephone Encounter (Signed)
 Patient called with no answer voicemail full

## 2024-01-31 NOTE — Telephone Encounter (Signed)
 Called pt states she did go to th ED yesterday and the did not give an IV the only have her 650 mg of tylenol  and were able to get her blood pressure to 152/80

## 2024-02-03 ENCOUNTER — Ambulatory Visit: Payer: Self-pay | Admitting: Medical Oncology

## 2024-02-04 ENCOUNTER — Other Ambulatory Visit: Payer: Self-pay

## 2024-02-04 ENCOUNTER — Emergency Department (HOSPITAL_BASED_OUTPATIENT_CLINIC_OR_DEPARTMENT_OTHER)

## 2024-02-04 ENCOUNTER — Emergency Department (HOSPITAL_BASED_OUTPATIENT_CLINIC_OR_DEPARTMENT_OTHER)
Admission: EM | Admit: 2024-02-04 | Discharge: 2024-02-04 | Discharge status: Against Medical Advice | Source: Home / Self Care | Attending: Emergency Medicine | Admitting: Emergency Medicine

## 2024-02-04 ENCOUNTER — Inpatient Hospital Stay (HOSPITAL_BASED_OUTPATIENT_CLINIC_OR_DEPARTMENT_OTHER)
Admission: EM | Admit: 2024-02-04 | Discharge: 2024-02-06 | DRG: 305 | Disposition: A | Discharge status: Home / Self Care | Source: Skilled Nursing Facility | Attending: Family Medicine | Admitting: Family Medicine

## 2024-02-04 ENCOUNTER — Ambulatory Visit (INDEPENDENT_AMBULATORY_CARE_PROVIDER_SITE_OTHER): Admitting: Medical

## 2024-02-04 ENCOUNTER — Encounter (HOSPITAL_BASED_OUTPATIENT_CLINIC_OR_DEPARTMENT_OTHER): Payer: Self-pay | Admitting: Emergency Medicine

## 2024-02-04 ENCOUNTER — Encounter: Payer: Self-pay | Admitting: Medical

## 2024-02-04 VITALS — HR 51 | Resp 16 | Ht 64.0 in | Wt 141.0 lb

## 2024-02-04 DIAGNOSIS — R001 Bradycardia, unspecified: Secondary | ICD-10-CM | POA: Diagnosis present

## 2024-02-04 DIAGNOSIS — I161 Hypertensive emergency: Principal | ICD-10-CM | POA: Diagnosis present

## 2024-02-04 DIAGNOSIS — Z7982 Long term (current) use of aspirin: Secondary | ICD-10-CM | POA: Diagnosis not present

## 2024-02-04 DIAGNOSIS — I10 Essential (primary) hypertension: Secondary | ICD-10-CM | POA: Diagnosis not present

## 2024-02-04 DIAGNOSIS — D631 Anemia in chronic kidney disease: Secondary | ICD-10-CM | POA: Diagnosis present

## 2024-02-04 DIAGNOSIS — Z7984 Long term (current) use of oral hypoglycemic drugs: Secondary | ICD-10-CM

## 2024-02-04 DIAGNOSIS — E876 Hypokalemia: Secondary | ICD-10-CM | POA: Diagnosis present

## 2024-02-04 DIAGNOSIS — Z888 Allergy status to other drugs, medicaments and biological substances status: Secondary | ICD-10-CM | POA: Diagnosis not present

## 2024-02-04 DIAGNOSIS — R519 Headache, unspecified: Secondary | ICD-10-CM | POA: Diagnosis present

## 2024-02-04 DIAGNOSIS — Z5321 Procedure and treatment not carried out due to patient leaving prior to being seen by health care provider: Secondary | ICD-10-CM | POA: Insufficient documentation

## 2024-02-04 DIAGNOSIS — N184 Chronic kidney disease, stage 4 (severe): Secondary | ICD-10-CM | POA: Diagnosis present

## 2024-02-04 DIAGNOSIS — D72819 Decreased white blood cell count, unspecified: Secondary | ICD-10-CM | POA: Diagnosis present

## 2024-02-04 DIAGNOSIS — G4733 Obstructive sleep apnea (adult) (pediatric): Secondary | ICD-10-CM | POA: Diagnosis present

## 2024-02-04 DIAGNOSIS — Z79899 Other long term (current) drug therapy: Secondary | ICD-10-CM

## 2024-02-04 DIAGNOSIS — I5032 Chronic diastolic (congestive) heart failure: Secondary | ICD-10-CM | POA: Diagnosis present

## 2024-02-04 DIAGNOSIS — G4730 Sleep apnea, unspecified: Secondary | ICD-10-CM | POA: Diagnosis not present

## 2024-02-04 DIAGNOSIS — E7800 Pure hypercholesterolemia, unspecified: Secondary | ICD-10-CM | POA: Diagnosis present

## 2024-02-04 DIAGNOSIS — Z87891 Personal history of nicotine dependence: Secondary | ICD-10-CM

## 2024-02-04 DIAGNOSIS — Z7983 Long term (current) use of bisphosphonates: Secondary | ICD-10-CM

## 2024-02-04 DIAGNOSIS — M542 Cervicalgia: Secondary | ICD-10-CM | POA: Insufficient documentation

## 2024-02-04 DIAGNOSIS — D573 Sickle-cell trait: Secondary | ICD-10-CM | POA: Diagnosis present

## 2024-02-04 DIAGNOSIS — E1169 Type 2 diabetes mellitus with other specified complication: Secondary | ICD-10-CM | POA: Diagnosis present

## 2024-02-04 DIAGNOSIS — E1122 Type 2 diabetes mellitus with diabetic chronic kidney disease: Secondary | ICD-10-CM | POA: Diagnosis present

## 2024-02-04 DIAGNOSIS — R7989 Other specified abnormal findings of blood chemistry: Secondary | ICD-10-CM

## 2024-02-04 DIAGNOSIS — I130 Hypertensive heart and chronic kidney disease with heart failure and stage 1 through stage 4 chronic kidney disease, or unspecified chronic kidney disease: Secondary | ICD-10-CM | POA: Diagnosis present

## 2024-02-04 DIAGNOSIS — Z91128 Patient's intentional underdosing of medication regimen for other reason: Secondary | ICD-10-CM

## 2024-02-04 DIAGNOSIS — I160 Hypertensive urgency: Principal | ICD-10-CM

## 2024-02-04 DIAGNOSIS — Z832 Family history of diseases of the blood and blood-forming organs and certain disorders involving the immune mechanism: Secondary | ICD-10-CM

## 2024-02-04 LAB — URINALYSIS, MICROSCOPIC (REFLEX)
RBC / HPF: NONE SEEN RBC/hpf (ref 0–5)
WBC, UA: NONE SEEN WBC/hpf (ref 0–5)

## 2024-02-04 LAB — TROPONIN T, HIGH SENSITIVITY
Troponin T High Sensitivity: 46 ng/L — ABNORMAL HIGH (ref <19)
Troponin T High Sensitivity: 50 ng/L — ABNORMAL HIGH (ref <19)
Troponin T High Sensitivity: 50 ng/L — ABNORMAL HIGH (ref <19)

## 2024-02-04 LAB — CBC
HCT: 34.2 % — ABNORMAL LOW (ref 36.0–46.0)
Hemoglobin: 11 g/dL — ABNORMAL LOW (ref 12.0–15.0)
MCH: 26.2 pg (ref 26.0–34.0)
MCHC: 32.2 g/dL (ref 30.0–36.0)
MCV: 81.4 fL (ref 80.0–100.0)
Platelets: 284 K/uL (ref 150–400)
RBC: 4.2 MIL/uL (ref 3.87–5.11)
RDW: 15 % (ref 11.5–15.5)
WBC: 2.8 K/uL — ABNORMAL LOW (ref 4.0–10.5)
nRBC: 0 % (ref 0.0–0.2)

## 2024-02-04 LAB — BASIC METABOLIC PANEL WITH GFR
Anion gap: 11 (ref 5–15)
BUN: 27 mg/dL — ABNORMAL HIGH (ref 8–23)
CO2: 24 mmol/L (ref 22–32)
Calcium: 8.9 mg/dL (ref 8.9–10.3)
Chloride: 106 mmol/L (ref 98–111)
Creatinine, Ser: 2.1 mg/dL — ABNORMAL HIGH (ref 0.44–1.00)
GFR, Estimated: 25 mL/min — ABNORMAL LOW (ref >60)
Glucose, Bld: 91 mg/dL (ref 70–99)
Potassium: 3.4 mmol/L — ABNORMAL LOW (ref 3.5–5.1)
Sodium: 140 mmol/L (ref 135–145)

## 2024-02-04 LAB — URINALYSIS, ROUTINE W REFLEX MICROSCOPIC
Bilirubin Urine: NEGATIVE
Glucose, UA: 250 mg/dL — AB
Hgb urine dipstick: NEGATIVE
Ketones, ur: NEGATIVE mg/dL
Leukocytes,Ua: NEGATIVE
Nitrite: NEGATIVE
Protein, ur: >=300 mg/dL — AB
Specific Gravity, Urine: 1.015 (ref 1.005–1.030)
pH: 6 (ref 5.0–8.0)

## 2024-02-04 LAB — GLUCOSE, CAPILLARY: Glucose-Capillary: 150 mg/dL — ABNORMAL HIGH (ref 70–99)

## 2024-02-04 LAB — PRO BRAIN NATRIURETIC PEPTIDE: Pro Brain Natriuretic Peptide: 8690 pg/mL — ABNORMAL HIGH (ref <300.0)

## 2024-02-04 MED ORDER — CLONIDINE HCL 0.1 MG PO TABS
0.1000 mg | ORAL_TABLET | Freq: Two times a day (BID) | ORAL | Status: DC
  Administered 2024-02-04 – 2024-02-06 (×4): 0.1 mg via ORAL
  Filled 2024-02-04 (×4): qty 1

## 2024-02-04 MED ORDER — HYDRALAZINE HCL 20 MG/ML IJ SOLN
10.0000 mg | INTRAMUSCULAR | Status: DC | PRN
  Administered 2024-02-05: 10 mg via INTRAVENOUS
  Filled 2024-02-04: qty 1

## 2024-02-04 MED ORDER — HEPARIN SODIUM (PORCINE) 5000 UNIT/ML IJ SOLN
5000.0000 [IU] | Freq: Three times a day (TID) | INTRAMUSCULAR | Status: DC
  Administered 2024-02-05 – 2024-02-06 (×4): 5000 [IU] via SUBCUTANEOUS
  Filled 2024-02-04 (×4): qty 1

## 2024-02-04 MED ORDER — SENNOSIDES-DOCUSATE SODIUM 8.6-50 MG PO TABS: 1.0000 | ORAL_TABLET | Freq: Every evening | ORAL | Status: DC | PRN

## 2024-02-04 MED ORDER — SODIUM CHLORIDE 0.9% FLUSH
3.0000 mL | Freq: Two times a day (BID) | INTRAVENOUS | Status: DC
  Administered 2024-02-05 – 2024-02-06 (×3): 3 mL via INTRAVENOUS

## 2024-02-04 MED ORDER — FUROSEMIDE 40 MG PO TABS
40.0000 mg | ORAL_TABLET | Freq: Every day | ORAL | Status: DC
  Administered 2024-02-04 – 2024-02-06 (×3): 40 mg via ORAL
  Filled 2024-02-04 (×3): qty 1

## 2024-02-04 MED ORDER — ACETAMINOPHEN 325 MG PO TABS
650.0000 mg | ORAL_TABLET | Freq: Four times a day (QID) | ORAL | Status: DC | PRN
  Administered 2024-02-05 – 2024-02-06 (×2): 650 mg via ORAL
  Filled 2024-02-04 (×2): qty 2

## 2024-02-04 MED ORDER — ISOSORB DINITRATE-HYDRALAZINE 20-37.5 MG PO TABS: 1.0000 | ORAL_TABLET | Freq: Three times a day (TID) | ORAL | Status: DC

## 2024-02-04 MED ORDER — HYDRALAZINE HCL 20 MG/ML IJ SOLN
20.0000 mg | Freq: Once | INTRAMUSCULAR | Status: AC
  Administered 2024-02-04: 20 mg via INTRAVENOUS
  Filled 2024-02-04: qty 1

## 2024-02-04 MED ORDER — METOPROLOL SUCCINATE ER 25 MG PO TB24
50.0000 mg | ORAL_TABLET | Freq: Every day | ORAL | Status: DC
  Administered 2024-02-04: 50 mg via ORAL
  Filled 2024-02-04 (×2): qty 2

## 2024-02-04 MED ORDER — HYDRALAZINE HCL 20 MG/ML IJ SOLN
10.0000 mg | Freq: Once | INTRAMUSCULAR | Status: AC
  Administered 2024-02-04: 10 mg via INTRAVENOUS
  Filled 2024-02-04: qty 1

## 2024-02-04 MED ORDER — ASPIRIN 81 MG PO TBEC
81.0000 mg | DELAYED_RELEASE_TABLET | Freq: Every day | ORAL | Status: DC
  Administered 2024-02-04 – 2024-02-06 (×3): 81 mg via ORAL
  Filled 2024-02-04 (×3): qty 1

## 2024-02-04 MED ORDER — ONDANSETRON HCL 4 MG PO TABS: 4.0000 mg | ORAL_TABLET | Freq: Four times a day (QID) | ORAL | Status: DC | PRN

## 2024-02-04 MED ORDER — ACETAMINOPHEN 650 MG RE SUPP: 650.0000 mg | Freq: Four times a day (QID) | RECTAL | Status: DC | PRN

## 2024-02-04 MED ORDER — DIAZEPAM 5 MG PO TABS: 5.0000 mg | ORAL_TABLET | Freq: Two times a day (BID) | ORAL | Status: DC

## 2024-02-04 MED ORDER — ONDANSETRON HCL 4 MG/2ML IJ SOLN: 4.0000 mg | Freq: Four times a day (QID) | INTRAMUSCULAR | Status: DC | PRN

## 2024-02-04 MED ORDER — POTASSIUM CHLORIDE CRYS ER 20 MEQ PO TBCR
40.0000 meq | EXTENDED_RELEASE_TABLET | Freq: Once | ORAL | Status: AC
  Administered 2024-02-04: 40 meq via ORAL
  Filled 2024-02-04: qty 2

## 2024-02-04 MED ORDER — ATORVASTATIN CALCIUM 40 MG PO TABS
40.0000 mg | ORAL_TABLET | Freq: Every day | ORAL | Status: DC
  Administered 2024-02-04 – 2024-02-06 (×3): 40 mg via ORAL
  Filled 2024-02-04 (×3): qty 1

## 2024-02-04 NOTE — ED Triage Notes (Signed)
 Pt here this AM for elevated BP, LWBS.  Returned for further evaluation.  Still c/o headache.

## 2024-02-04 NOTE — ED Notes (Signed)
 Called [facility] for transport @18 :33.  Spoke with Luke

## 2024-02-04 NOTE — ED Notes (Signed)
 Patient told registration they were leaving to go to their doctors appointment upstairs, noted to leave department.

## 2024-02-04 NOTE — Plan of Care (Signed)
   I was asked to call [facility] to discuss admission of this patient from [facility].  I discussed her care with the attending ER physician and agreed to admit her to a cardiac telemetry bed at either [facility] or [facility].  Admission orders have been placed.  The patient is a 73 year old with a medical history to include uncontrolled hypertension, CKD, chronic anemia, chronic diastolic CHF, and sickle cell trait.  She has a primary care physician and is also seen by hematology who follows her anemia and doses her with erythropoietin .  She presented to the ER with severely uncontrolled blood pressure and an unrelenting headache.  Despite therapy in the ER her systolic blood pressure has persisted at >200.  CT head was unrevealing for acute findings.  She does have a modestly elevated troponin, thus far in a pattern consistent with demand ischemia.  Given the refractory nature of her HTN and persistent headache which could be a precursor for PRES it is felt appropriate to admit her for medication titration.  If initial attempts to control her blood pressure with oral medications failed she may require short-term use of an intravenous drip.  Reyes IVAR Moores, MD [facility] Office  [phone]

## 2024-02-04 NOTE — ED Notes (Signed)
 PT provided with a TV meal, saltine crackers, peanut butter, cup of water, and ginger ale.

## 2024-02-04 NOTE — Patient Instructions (Signed)
 Hypertensive in emergency range (though my repeat check was mildly better) with elevated troponin Elevated troponin at 46 suggests cardiac stress, possibly due to demand ischemia from high blood pressure (234/105(question MI though not having chest pain). Hospital admission may be necessary based on troponin trends. - Return to emergency department for repeat troponin testing and monitoring. - Provide visit summary to charge nurse upon return. - Notify triage staff to inform charge nurse of her return. - Review emergency department notes and lab results once available. - Consider contacting cardiologist Dr. Krasowski for further management. After ED note review.  follow up date to be determined.

## 2024-02-04 NOTE — Hospital Course (Signed)
 Olivia Morales is a 73 y.o. female with medical history significant for chronic HFpEF, CKD stage IV, uncontrolled HTN, HLD, anemia of chronic disease, chronic leukopenia, sickle cell trait, OSA not using CPAP who is admitted with hypertensive emergency.

## 2024-02-04 NOTE — ED Provider Notes (Signed)
 [place] [facility] Provider Note   CSN: [id number] Arrival date & time: 02/04/24  1538     Patient presents with: Hypertension   Olivia Morales is a 73 y.o. female.  She was here earlier today for headache neck pain and elevated blood pressure.  She did not stay for evaluation and went to her primary care doctor.  Troponin came back elevated in the 40s.  PCP sent her back to the emergency department for evaluation.  She has had ongoing hypertension despite being compliant with medicines.  I actually saw her last week for same.  She said she has had 40 pounds of weight loss over the last 5 months.  Swelling in her legs.  Follows with Dr. Bernie cardiology.  Cardiac echo done 4/25 showing ejection fraction of 60 to 65% with moderate LVH consistent with diastolic dysfunction.  She states her headache is 11 out of 10 throbbing radiating into her neck it has been going on since she woke up this morning.  She denies any chest pain or shortness of breath.  No abdominal pain.  She has had multiple soft bowel movements but not diarrhea.  No urinary symptoms.  {Add pertinent medical, surgical, social history, OB history to YEP:67052} The history is provided by the patient.  Hypertension This is a chronic problem. The problem has not changed since onset.Associated symptoms include headaches. Pertinent negatives include no chest pain, no abdominal pain and no shortness of breath. Nothing aggravates the symptoms. Nothing relieves the symptoms. She has tried rest for the symptoms. The treatment provided no relief.       Prior to Admission medications   Medication Sig Start Date End Date Taking? Authorizing Provider  alendronate  (FOSAMAX ) 10 MG tablet Take 10 mg by mouth daily before breakfast. Take with a full glass of water on an empty stomach.    [provider]  aspirin  EC 81 MG tablet Take 81 mg by mouth daily. Swallow whole.    [provider]  atorvastatin  (LIPITOR) 40 MG tablet Take 1 tablet (40 mg total) by mouth daily. 12/17/23   Saguier, [place], PA-C  cloNIDine  (CATAPRES ) 0.1 MG tablet Take 1 tablet (0.1 mg total) by mouth once for 1 dose. 11/01/23 02/04/24  Krasowski, Robert J, MD  Cyanocobalamin  (VITAMIN B-12 PO) Take 1 tablet by mouth once a week.    [provider]  diazepam  (VALIUM ) 5 MG tablet Take 1 tablet (5 mg total) by mouth 2 (two) times daily. 12/28/23   Theotis Peers M, PA-C  empagliflozin (JARDIANCE) 10 MG TABS tablet Take 10 mg by mouth daily. 10/18/23 10/13/24  [provider]  ferrous sulfate 325 (65 FE) MG tablet Take 325 mg by mouth daily with breakfast. 10/16/23   [provider]  furosemide  (LASIX ) 40 MG tablet Take 40 mg by mouth daily. 10/16/23   [provider]  hydrALAZINE  (APRESOLINE ) 25 MG tablet Take 3 tablets (75 mg total) by mouth 3 (three) times daily. 10/10/23   Saguier, [place], PA-C  ibuprofen  (ADVIL ) 600 MG tablet Take 1 tablet (600 mg total) by mouth every 6 (six) hours as needed. 12/28/23   Theotis Peers M, PA-C  Lancets (ONETOUCH DELICA PLUS [place]) MISC USE TO TEST 1-2 TIMES DAILY 07/28/18   Saguier, [place], PA-C  lidocaine  (LIDODERM ) 5 % Place 1 patch onto the skin daily. Remove & Discard patch within 12 hours or as directed by MD 12/28/23   Theotis Peers HERO, PA-C  metoprolol   succinate (TOPROL -XL) 50 MG 24 hr tablet Take 1 tablet (50 mg total) by mouth daily. Take with or immediately following a meal 12/17/23   Saguier, [place], PA-C  omeprazole  (PRILOSEC) 20 MG capsule Take 20 mg by mouth daily.    [provider]  [facility] VERIO test strip Check blood sugar 1-2 times daily 05/28/18   Saguier, [place], PA-C  potassium chloride  (MICRO-K ) 10 MEQ CR capsule Take 10 mEq by mouth 2 (two) times daily. 10/16/23   [provider]    Allergies: Ace inhibitors    Review of Systems  Constitutional:  Positive for unexpected weight change.  Negative for fever.  HENT:  Negative for sore throat.   Eyes:  Negative for visual disturbance.  Respiratory:  Negative for shortness of breath.   Cardiovascular:  Positive for leg swelling. Negative for chest pain.  Gastrointestinal:  Negative for abdominal pain, nausea and vomiting.  Genitourinary:  Negative for dysuria.  Skin:  Negative for rash.  Neurological:  Positive for headaches.    Updated Vital Signs BP (!) 238/102 (BP Location: Right Arm)   Pulse (!) 49   Temp 98.1 F (36.7 C)   Resp 18   SpO2 98%   Physical Exam Vitals and nursing note reviewed.  Constitutional:      General: She is not in acute distress.    Appearance: Normal appearance. She is well-developed.  HENT:     Head: Normocephalic and atraumatic.  Eyes:     Conjunctiva/sclera: Conjunctivae normal.  Cardiovascular:     Rate and Rhythm: Regular rhythm. Bradycardia present.     Heart sounds: No murmur heard. Pulmonary:     Effort: Pulmonary effort is normal. No respiratory distress.     Breath sounds: Normal breath sounds. No stridor. No wheezing.  Abdominal:     Palpations: Abdomen is soft.     Tenderness: There is no abdominal tenderness. There is no guarding or rebound.  Musculoskeletal:        General: No deformity.     Cervical back: Neck supple.  Skin:    General: Skin is warm and dry.  Neurological:     General: No focal deficit present.     Mental Status: She is alert.     GCS: GCS eye subscore is 4. GCS verbal subscore is 5. GCS motor subscore is 6.     Cranial Nerves: No cranial nerve deficit.     Sensory: No sensory deficit.     Motor: No weakness.     (all labs ordered are listed, but only abnormal results are displayed) Labs Reviewed  URINALYSIS, ROUTINE W REFLEX MICROSCOPIC  PRO BRAIN NATRIURETIC PEPTIDE  TROPONIN T, HIGH SENSITIVITY    EKG: None  Radiology: DG Chest 2 View Result Date: 02/04/2024 EXAM: 2 VIEW(S) XRAY OF THE CHEST 02/04/2024 01:10:35 PM COMPARISON: None  available. CLINICAL HISTORY: Hypertension. Patient reports elevated blood pressure this morning, headache and neck pain with some palpitations since approximately 0500 today. FINDINGS: LUNGS AND PLEURA: No focal pulmonary opacity. No pulmonary edema. No pleural effusion. No pneumothorax. HEART AND MEDIASTINUM: Similar enlarged cardiomediastinal silhouette. BONES AND SOFT TISSUES: No acute osseous abnormality. IMPRESSION: 1. No focal consolidations. 2. Similar cardiomegaly. Electronically signed by: Manford Cummins MD 02/04/2024 01:43 PM EDT RP Workstation: [id number]    {Document cardiac monitor, telemetry assessment procedure when appropriate:32947} Procedures   Medications Ordered in the ED - No data to display    {Click here for ABCD2, HEART and other calculators REFRESH Note before  signing:1}                              Medical Decision Making Amount and/or Complexity of Data Reviewed Labs: ordered. Radiology: ordered.   This patient complains of ***; this involves an extensive number of treatment Options and is a complaint that carries with it a high risk of complications and morbidity. The differential includes ***  I ordered, reviewed and interpreted labs, which included *** I ordered medication *** and reviewed PMP when indicated. I ordered imaging studies which included *** and I independently    visualized and interpreted imaging which showed *** Additional history obtained from *** Previous records obtained and reviewed *** I consulted *** and discussed lab and imaging findings and discussed disposition.  Cardiac monitoring reviewed, *** Social determinants considered, *** Critical Interventions: ***  After the interventions stated above, I reevaluated the patient and found *** Admission and further testing considered, ***   {Document critical care time when appropriate  Document review of labs and clinical decision tools ie CHADS2VASC2, etc  Document your independent  review of radiology images and any outside records  Document your discussion with family members, caretakers and with consultants  Document social determinants of health affecting pt's care  Document your decision making why or why not admission, treatments were needed:32947:::1}   Final diagnoses:  None    ED Discharge Orders     None

## 2024-02-04 NOTE — ED Triage Notes (Addendum)
 Pt POV reports elevated BP this AM, headache and neck pain since appx 0500 today.  Denies n/v/blurred vision.   Reports compliance with bp meds. Pt then takes out case of medications and proceeds to take daily medications in triage room.

## 2024-02-04 NOTE — Progress Notes (Signed)
 Subjective:    Patient ID: Livingston Lightning, female    DOB: [birth year], 73 y.o.   MRN: [id number]  HPI Evely Shadix is a 73 year old female with hypertension who presents high blood pressure. I had explained I  am concerned about elevated troponin levels and high blood pressure.  Her troponin level was elevated to 46 today at the emergency department today.( I don't see second check and note state left without being seen). She states she was not informed of troponin elevation while at the hospital and only learned about it only when I told her.  She left because she did not want to miss appointment with me and states was waiting too long. She expresses frustration about not being taken back for further evaluation despite the elevated troponin level. I am not sure of how things played out down stairs.   Charge nurse states described pt was going to get 2nd troponin, but pt left. Charge nurse readily agreed to resume work up.  She has a history of hypertension with recent blood pressure readings as high as 234/105 and 238/200. She states she has been taking hydralazine  every eight hours, setting an alarm to ensure timely doses, butshe states it is not effectively controlling her blood pressure(other bp meds on her list as well but she does not report taking those)  . She mentions a previous instance where her blood pressure was 220/200 at a hematology cancer center, and she was sent home despite the high reading. I called  that thursday afternoon regarding her high bp  at hematologist office. That day she did not take bp med early in the day. When I saw her bp was so high which was over 200 systolic I advised to go to ED. She stated at that time she would take her bp and then go to ED. That note is in epic.  She feels tired of frequent visits to the emergency room   Review of Systems  Constitutional:  Negative for chills and fatigue.  Respiratory:  Negative for cough, chest  tightness and wheezing.   Cardiovascular:  Negative for chest pain and palpitations.       States can feel her hear beat  Gastrointestinal:  Negative for abdominal pain.  Musculoskeletal:  Negative for back pain.  Neurological:  Positive for headaches.       None reported to me but she did report to ED  Hematological:  Negative for adenopathy.  Psychiatric/Behavioral:  Positive for agitation. Negative for confusion, hallucinations and sleep disturbance.        Frustrated.       Objective:   Physical Exam General- No acute distress. Expresses frustration Neck- Fno jvd Lungs- Clear, even and unlabored. Heart- regular rate and rhythm. Neurologic- CNII- XII grossly intact.        Assessment & Plan:   Patient Instructions  Hypertensive in emergency range (though my repeat check was mildly better) with elevated troponin Elevated troponin at 46 suggests cardiac stress, possibly due to demand ischemia from high blood pressure (234/105(question MI though not having chest pain). Hospital admission may be necessary based on troponin trends. - Return to emergency department for repeat troponin testing and monitoring. - Provide visit summary to charge nurse upon return. - Notify triage staff to inform charge nurse of her return. - Review emergency department notes and lab results once available. - Consider contacting cardiologist Dr. Krasowski for further management. After ED note review.  follow up date to be determined.  Brycen Bean, PA-C    Time spent with patient today was  43 minutes which consisted of chart review, discussing diagnosis, reason for further work up , calling to dicuss case with charge nurse and documentation.

## 2024-02-04 NOTE — H&P (Signed)
 History and Physical    Olivia Morales FMW:[id number] DOB: 1950/08/22 DOA: 02/04/2024  PCP: Dorina Loving, PA-C  Patient coming from: Home  I have personally briefly reviewed patient's old medical records in [facility]  Chief Complaint: High blood pressure, headache  HPI: Olivia Morales is a 73 y.o. female with medical history significant for chronic HFpEF, CKD stage IV, uncontrolled HTN, HLD, anemia of chronic disease, chronic leukopenia, sickle cell trait, OSA not using CPAP who presented to the ED for evaluation of significantly elevated blood pressure and headache.  Patient states that for the last 3 months her blood pressure has been very elevated.  She admits that she has not been taking her medications as prescribed regularly.  She was seen in the ED on 7/31 for high blood pressure which improved after she took her home meds.  She says since then she has been taking all of her medications regularly as prescribed.  Patient states that she has been having a persistent headache for the last week.  She was seen in the ED again today at which time's labs were obtained and showed elevated troponin T 46.  She reported left the ED and went to her PCP for evaluation.  She was advised to return to the ED for further management due to severe hypertension and elevated troponin.  Patient seen on the floor after transfer from med center.  She is feeling better as BP is improving.  She denies chest pain, dyspnea, nausea, vomiting.  She does report that she has a longstanding history of sleep apnea diagnosed years ago and has not been using her CPAP.  She says she has lost a significant amount of weight in the interim.  [facility] ED Course  Labs/Imaging on admission: I have personally reviewed following labs and imaging studies.  Initial vitals showed BP 238/102, pulse 49, RR 18, temp 98.1 F, SpO2 98% on room air.  Labs showed proBNP 8690, troponin T 46 > 50 > 50.   WBC 2.8, hemoglobin 11.0, platelets 284, sodium 140, potassium 3.4, bicarb 24, BUN 27, creatinine 2.10, serum glucose 91.  CT head without contrast negative for acute intracranial abnormality.  2 view chest x-ray showed stable cardiomegaly without focal consolidation, edema, or effusion.  Patient was given IV hydralazine  10 mg then 20 mg.  The hospitalist service was consulted to admit.  Review of Systems: All systems reviewed and are negative except as documented in history of present illness above.   Past Medical History:  Diagnosis Date   CHF (congestive heart failure) (HCC)    Diabetes mellitus    Hypercholesteremia    Hypertension    Sickle cell trait (HCC)    Sleep apnea     Past Surgical History:  Procedure Laterality Date   TUBAL LIGATION      Social History: Social History   Tobacco Use   Smoking status: Never   Smokeless tobacco: Never  Vaping Use   Vaping status: Never Used  Substance Use Topics   Alcohol use: Yes    Comment: drinks beer on occassion   Drug use: Not Currently    Types: Crack cocaine    Comment: history of smoking crack ...it's been a 1.5 years     Allergies  Allergen Reactions   Ace Inhibitors Cough    Family History  Problem Relation Age of Onset   Sickle cell anemia Father      Prior to Admission medications   Medication Sig Start Date  End Date Taking? Authorizing Provider  alendronate  (FOSAMAX ) 10 MG tablet Take 10 mg by mouth daily before breakfast. Take with a full glass of water on an empty stomach.    [provider]  aspirin  EC 81 MG tablet Take 81 mg by mouth daily. Swallow whole.    [provider]  atorvastatin  (LIPITOR) 40 MG tablet Take 1 tablet (40 mg total) by mouth daily. 12/17/23   Saguier, [place], PA-C  cloNIDine  (CATAPRES ) 0.1 MG tablet Take 1 tablet (0.1 mg total) by mouth once for 1 dose. 11/01/23 02/04/24  Krasowski, Robert J, MD  Cyanocobalamin  (VITAMIN B-12 PO) Take 1 tablet by mouth once a  week.    [provider]  diazepam  (VALIUM ) 5 MG tablet Take 1 tablet (5 mg total) by mouth 2 (two) times daily. 12/28/23   Theotis Peers M, PA-C  empagliflozin (JARDIANCE) 10 MG TABS tablet Take 10 mg by mouth daily. 10/18/23 10/13/24  [provider]  ferrous sulfate 325 (65 FE) MG tablet Take 325 mg by mouth daily with breakfast. 10/16/23   [provider]  furosemide  (LASIX ) 40 MG tablet Take 40 mg by mouth daily. 10/16/23   [provider]  ibuprofen  (ADVIL ) 600 MG tablet Take 1 tablet (600 mg total) by mouth every 6 (six) hours as needed. 12/28/23   Theotis Peers M, PA-C  Lancets (ONETOUCH DELICA PLUS [place]) MISC USE TO TEST 1-2 TIMES DAILY 07/28/18   Saguier, [place], PA-C  lidocaine  (LIDODERM ) 5 % Place 1 patch onto the skin daily. Remove & Discard patch within 12 hours or as directed by MD 12/28/23   Theotis Peers HERO, PA-C  metoprolol  succinate (TOPROL -XL) 50 MG 24 hr tablet Take 1 tablet (50 mg total) by mouth daily. Take with or immediately following a meal 12/17/23   Saguier, [place], PA-C  omeprazole  (PRILOSEC) 20 MG capsule Take 20 mg by mouth daily.    [provider]  [facility] VERIO test strip Check blood sugar 1-2 times daily 05/28/18   Saguier, [place], PA-C  potassium chloride  (MICRO-K ) 10 MEQ CR capsule Take 10 mEq by mouth 2 (two) times daily. 10/16/23   [provider]    Physical Exam: Vitals:   02/04/24 1721 02/04/24 1730 02/04/24 1830 02/04/24 2114  BP: (!) 230/92 (!) 226/91 (!) 218/90 (!) (P) 191/86  Pulse:  (!) 53 (!) 56 (P) 61  Resp:  17 (!) 21 (P) 20  Temp:    (P) 98.2 F (36.8 C)  TempSrc:    (P) Oral  SpO2:  100% 95% (P) 100%  Weight:    (P) 61.7 kg  Height:    (P) 5' 4 (1.626 m)   Constitutional: Sitting up in bed, NAD, calm, comfortable Eyes: EOMI, lids and conjunctivae normal ENMT: Mucous membranes are moist. Posterior pharynx clear of any exudate or lesions.Normal dentition.  Neck: normal, supple, no  masses. Respiratory: clear to auscultation bilaterally, no wheezing, no crackles. Normal respiratory effort. No accessory muscle use.  Cardiovascular: Regular rate and rhythm, no murmurs / rubs / gallops. No extremity edema. 2+ pedal pulses. Abdomen: no tenderness, no masses palpated. Musculoskeletal: no clubbing / cyanosis. No joint deformity upper and lower extremities. Good ROM, no contractures. Normal muscle tone.  Skin: no rashes, lesions, ulcers. No induration Neurologic: Sensation intact. Strength 5/5 in all 4.  Psychiatric: Normal judgment and insight. Alert and oriented x 3. Normal mood.   EKG: Personally reviewed. Sinus bradycardia, rate 48, RBBB.  Rate is slower when compared to  previous.  Assessment/Plan Principal Problem:   Hypertensive emergency Active Problems:   Hyperlipidemia associated with type 2 diabetes mellitus (HCC)   OSA (obstructive sleep apnea)   Chronic heart failure with preserved ejection fraction (HFpEF) (HCC)   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    CKD (chronic kidney disease), stage IV (HCC)   Chronic leukopenia   Olivia Morales is a 73 y.o. female with medical history significant for chronic HFpEF, CKD stage IV, uncontrolled HTN, HLD, anemia of chronic disease, chronic leukopenia, sickle cell trait, OSA not using CPAP who is admitted with hypertensive emergency.  Assessment and Plan: Hypertensive emergency: BP improving with initial management in the ED and resume home meds.  CT head without contrast negative for acute intracranial normality. - Clonidine  0.1 mg daily - Toprol -XL 50 mg daily - Lasix  40 mg daily - IV hydralazine  ordered as needed  Chronic HFpEF: Appears euvolemic on admission.  Continue Toprol -XL and Lasix .  Holding Jardiance for now.  CKD stage IV: Renal function stable.  Chronic leukopenia: Appears to have chronic leukopenia of unclear etiology.  Check peripheral smear, follow-up with  hematology.  Anemia of chronic disease: Hemoglobin stable.  Follows with hematology, treated with Aranesp  every 3 weeks for hemoglobin <11.  Hyperlipidemia: Continue atorvastatin .  OSA: Not using CPAP due to intolerance.  Patient advised on considering repeat sleep study via PCP.   DVT prophylaxis: heparin  injection 5,000 Units Start: 02/05/24 0600 Code Status: Full code, confirmed with patient on admission Family Communication: Granddaughter at bedside Disposition Plan: From home and likely discharge to home pending clinical progress Consults called: None Severity of Illness: The appropriate patient status for this patient is INPATIENT. Inpatient status is judged to be reasonable and necessary in order to provide the required intensity of service to ensure the patient's safety. The patient's presenting symptoms, physical exam findings, and initial radiographic and laboratory data in the context of their chronic comorbidities is felt to place them at high risk for further clinical deterioration. Furthermore, it is not anticipated that the patient will be medically stable for discharge from the hospital within 2 midnights of admission.   * I certify that at the point of admission it is my clinical judgment that the patient will require inpatient hospital care spanning beyond 2 midnights from the point of admission due to high intensity of service, high risk for further deterioration and high frequency of surveillance required.DEWAINE Jorie Blanch MD [facility]  If 7PM-7AM, please contact night-coverage [email or web address]  02/04/2024, 11:12 PM

## 2024-02-04 NOTE — ED Notes (Signed)
 Report called to RAYNARD Jolynn Pack RN.

## 2024-02-05 ENCOUNTER — Encounter (HOSPITAL_COMMUNITY): Payer: Self-pay | Admitting: Internal Medicine

## 2024-02-05 ENCOUNTER — Inpatient Hospital Stay (HOSPITAL_COMMUNITY)

## 2024-02-05 DIAGNOSIS — I161 Hypertensive emergency: Secondary | ICD-10-CM | POA: Diagnosis not present

## 2024-02-05 LAB — CBC WITH DIFFERENTIAL/PLATELET
Abs Immature Granulocytes: 0.01 K/uL (ref 0.00–0.07)
Basophils Absolute: 0.1 K/uL (ref 0.0–0.1)
Basophils Relative: 2 %
Eosinophils Absolute: 0.1 K/uL (ref 0.0–0.5)
Eosinophils Relative: 2 %
HCT: 30.7 % — ABNORMAL LOW (ref 36.0–46.0)
Hemoglobin: 10 g/dL — ABNORMAL LOW (ref 12.0–15.0)
Immature Granulocytes: 0 %
Lymphocytes Relative: 35 %
Lymphs Abs: 1 K/uL (ref 0.7–4.0)
MCH: 26.5 pg (ref 26.0–34.0)
MCHC: 32.6 g/dL (ref 30.0–36.0)
MCV: 81.4 fL (ref 80.0–100.0)
Monocytes Absolute: 0.4 K/uL (ref 0.1–1.0)
Monocytes Relative: 15 %
Neutro Abs: 1.3 K/uL — ABNORMAL LOW (ref 1.7–7.7)
Neutrophils Relative %: 46 %
Platelets: 253 K/uL (ref 150–400)
RBC: 3.77 MIL/uL — ABNORMAL LOW (ref 3.87–5.11)
RDW: 14.9 % (ref 11.5–15.5)
WBC: 2.9 K/uL — ABNORMAL LOW (ref 4.0–10.5)
nRBC: 0 % (ref 0.0–0.2)

## 2024-02-05 LAB — BASIC METABOLIC PANEL WITH GFR
Anion gap: 8 (ref 5–15)
BUN: 26 mg/dL — ABNORMAL HIGH (ref 8–23)
CO2: 23 mmol/L (ref 22–32)
Calcium: 8.6 mg/dL — ABNORMAL LOW (ref 8.9–10.3)
Chloride: 108 mmol/L (ref 98–111)
Creatinine, Ser: 2.1 mg/dL — ABNORMAL HIGH (ref 0.44–1.00)
GFR, Estimated: 25 mL/min — ABNORMAL LOW (ref >60)
Glucose, Bld: 106 mg/dL — ABNORMAL HIGH (ref 70–99)
Potassium: 3.3 mmol/L — ABNORMAL LOW (ref 3.5–5.1)
Sodium: 139 mmol/L (ref 135–145)

## 2024-02-05 LAB — TECHNOLOGIST SMEAR REVIEW: Plt Morphology: NORMAL

## 2024-02-05 LAB — PROTEIN / CREATININE RATIO, URINE
Creatinine, Urine: 69 mg/dL
Protein Creatinine Ratio: 2.7 mg/mg{creat} — ABNORMAL HIGH (ref 0.00–0.15)
Total Protein, Urine: 186 mg/dL

## 2024-02-05 MED ORDER — HYDRALAZINE HCL 50 MG PO TABS
100.0000 mg | ORAL_TABLET | Freq: Three times a day (TID) | ORAL | Status: DC
  Administered 2024-02-05 – 2024-02-06 (×3): 100 mg via ORAL
  Filled 2024-02-05 (×3): qty 2

## 2024-02-05 MED ORDER — POTASSIUM CHLORIDE CRYS ER 20 MEQ PO TBCR
20.0000 meq | EXTENDED_RELEASE_TABLET | Freq: Once | ORAL | Status: AC
  Administered 2024-02-05: 20 meq via ORAL
  Filled 2024-02-05: qty 1

## 2024-02-05 MED ORDER — AMLODIPINE BESYLATE 10 MG PO TABS
10.0000 mg | ORAL_TABLET | Freq: Every day | ORAL | Status: DC
  Administered 2024-02-05 – 2024-02-06 (×2): 10 mg via ORAL
  Filled 2024-02-05 (×2): qty 1

## 2024-02-05 NOTE — Evaluation (Signed)
 Physical Therapy Evaluation Patient Details Name: Olivia Morales MRN: [id number] DOB: [birth year] Today's Date: 02/05/2024  History of Present Illness  73 y.o. female presents to [facility] hospital on 02/04/2024 with HTN and HA. PMH includes chronic HFpEF, CKD IV, HTN, HLD, anemia, chronic leukopenia, sickle cell trait, OSA.  Clinical Impression  Pt presents to PT with deficits in gait, balance, endurance. Pt's deficits appear more chronic in nature as the pt reports a decline since February of this year. Pt demonstrates improved stability with use of rollator and can utilize this device for energy conservation when ambulating in the community. PT recommends outpatient PT and a rollator at the time of discharge.        If plan is discharge home, recommend the following: Assistance with cooking/housework;Assist for transportation   Can travel by private Data processing manager (4 wheels)  Recommendations for Other Services       Functional Status Assessment Patient has had a recent decline in their functional status and demonstrates the ability to make significant improvements in function in a reasonable and predictable amount of time.     Precautions / Restrictions Precautions Precautions: Fall Restrictions Weight Bearing Restrictions Per Provider Order: No      Mobility  Bed Mobility Overal bed mobility: Independent                  Transfers Overall transfer level: Modified independent Equipment used: Straight cane                    Ambulation/Gait Ambulation/Gait assistance: Modified independent (Device/Increase time) Gait Distance (Feet): 250 Feet Assistive device: Rollator (4 wheels), Straight cane Gait Pattern/deviations: Step-through pattern Gait velocity: reduced Gait velocity interpretation: 1.31 - 2.62 ft/sec, indicative of limited community ambulator   General Gait Details: widened BOS for initial 27' with SPC. Gait  pattern for final 175' appears more steady, with less lateral instability  Stairs            Wheelchair Mobility     Tilt Bed    Modified Rankin (Stroke Patients Only)       Balance Overall balance assessment: Needs assistance Sitting-balance support: No upper extremity supported, Feet supported Sitting balance-Leahy Scale: Good     Standing balance support: Single extremity supported, Reliant on assistive device for balance Standing balance-Leahy Scale: Poor                               Pertinent Vitals/Pain Pain Assessment Pain Assessment: No/denies pain    Home Living Family/patient expects to be discharged to:: Private residence Living Arrangements: Spouse/significant other Available Help at Discharge: Friend(s) (significant other) Type of Home: House Home Access: Stairs to enter Entrance Stairs-Rails: Can reach both Entrance Stairs-Number of Steps: 4   Home Layout: One level Home Equipment: Agricultural consultant (2 wheels);Cane - single point;BSC/3in1;Shower seat;Grab bars - tub/shower      Prior Function Prior Level of Function : Independent/Modified Independent             Mobility Comments: pt furniture surfs in the home, ambulates with SPC in the community       Extremity/Trunk Assessment   Upper Extremity Assessment Upper Extremity Assessment: Overall WFL for tasks assessed    Lower Extremity Assessment Lower Extremity Assessment: Overall WFL for tasks assessed    Cervical / Trunk Assessment Cervical / Trunk Assessment: Normal  Communication   Communication  Communication: No apparent difficulties    Cognition Arousal: Alert Behavior During Therapy: WFL for tasks assessed/performed   PT - Cognitive impairments: No apparent impairments                         Following commands: Intact       Cueing Cueing Techniques: Verbal cues     General Comments General comments (skin integrity, edema, etc.): VSS on  RA, BP 160/93 after ambulation    Exercises     Assessment/Plan    PT Assessment Patient needs continued PT services  PT Problem List Decreased balance;Decreased activity tolerance;Decreased mobility       PT Treatment Interventions DME instruction;Gait training;Stair training;Functional mobility training;Balance training;Neuromuscular re-education;Patient/family education    PT Goals (Current goals can be found in the Care Plan section)  Acute Rehab PT Goals Patient Stated Goal: to return home, increase the amount of exercise she participates in PT Goal Formulation: With patient Time For Goal Achievement: 02/19/24 Potential to Achieve Goals: Good Additional Goals Additional Goal #1: Pt will ambulate for >1000' with use of DME, independently demonstrating good energy conservation strategies and taking seated or standing breaks as necessary. Additional Goal #2: Pt will score >19/24 on the DGI to indicate a reduced risk for falls    Frequency Min 1X/week     Co-evaluation               AM-PAC PT 6 Clicks Mobility  Outcome Measure Help needed turning from your back to your side while in a flat bed without using bedrails?: None Help needed moving from lying on your back to sitting on the side of a flat bed without using bedrails?: None Help needed moving to and from a bed to a chair (including a wheelchair)?: None Help needed standing up from a chair using your arms (e.g., wheelchair or bedside chair)?: None Help needed to walk in hospital room?: None Help needed climbing 3-5 steps with a railing? : A Little 6 Click Score: 23    End of Session Equipment Utilized During Treatment: Gait belt Activity Tolerance: Patient tolerated treatment well Patient left: in bed;with call bell/phone within reach Nurse Communication: Mobility status PT Visit Diagnosis: Other abnormalities of gait and mobility (R26.89)    Time: 8755-8694 PT Time Calculation (min) (ACUTE ONLY): 21  min   Charges:   PT Evaluation $PT Eval Low Complexity: 1 Low   PT General Charges $$ ACUTE PT VISIT: 1 Visit         Bernardino JINNY Ruth, PT, DPT Acute Rehabilitation Office [phone]   Bernardino JINNY Ruth 02/05/2024, 2:21 PM

## 2024-02-05 NOTE — Plan of Care (Signed)
  Problem: Clinical Measurements: Goal: Ability to maintain clinical measurements within normal limits will improve Outcome: Progressing   Problem: Activity: Goal: Risk for activity intolerance will decrease Outcome: Progressing   Problem: Coping: Goal: Level of anxiety will decrease Outcome: Progressing   Problem: Elimination: Goal: Will not experience complications related to bowel motility Outcome: Progressing

## 2024-02-05 NOTE — Progress Notes (Signed)
 PROGRESS NOTE    Olivia Morales  FMW:[id number] DOB: 17-Mar-1951 DOA: 02/04/2024 PCP: Dorina Loving, PA-C  Chief Complaint  Patient presents with   Hypertension    Brief Narrative:   Olivia Morales is Olivia Morales 73 y.o. female with medical history significant for chronic HFpEF, CKD stage IV, uncontrolled HTN, HLD, anemia of chronic disease, chronic leukopenia, sickle cell trait, OSA not using CPAP who presented to the ED for evaluation of significantly elevated blood pressure and headache.   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  Holding metoprolol  due to bradycardia. BP's still uncontrolled today.  She notes missing BP meds occasionally at home (encouraged to use pill box). Workup for secondary causes of hypertension: TSH 12/2023 wnl.  Renin/aldo level.  Renal artery US .  Echo 10/2023 with EF 60-65%, no RWMA, grade II diastolic dysfunction. Needs compliance with meds and CPAP   Headache Concern that HA is related to elevated BP above.  Tension like features.  No vision changes.  Head CT without acute intracranial abnormalities.   Will treat as needed, continue to monitor Resolved at this time  Bradycardia Holding metoprolol , monitor HR   HFpEF Jardiance on home Metoprolol  on hold as above  CKD IV  Proteinuria Creatinine appears close to baseline Follow  UP/C Will monitor  Hypokalemia Replace and follow   Leukopenia Chronic, follow outpatient  Smear with unremarkable morphology on tech review  Anemia of chronic disease Gets aranesp  every 3 weeks  Hyperlipidemia Statin  OSA Not using CPAP    DVT prophylaxis: heparin  Code Status: full Family Communication: none Disposition:   Status is: Inpatient Remains inpatient appropriate because: need for continued inpatient care   Consultants:  none  Procedures:  none  Antimicrobials:  Anti-infectives (From admission, onward)    None       Subjective: HA resolved Misses medicines sometimes  Objective: Vitals:   02/05/24 0313 02/05/24 0641 02/05/24 0803 02/05/24 0914  BP: (!) 212/107 (!) 206/109 (!) 181/88 (!) 204/91  Pulse: (!) 51 (!) 55 (!) 54 (!) 52  Resp: 19 15 15    Temp: 98.3 F (36.8 C) 98.3 F (36.8 C) 98.1 F (36.7 C)   TempSrc: Oral Oral Oral   SpO2: 99% 99% 98% 98%  Weight: 62 kg     Height:        Intake/Output Summary (Last 24 hours) at 02/05/2024 0948 Last data filed at 02/05/2024 0600 Gross per 24 hour  Intake 480 ml  Output --  Net 480 ml   Filed Weights   02/04/24 2114 02/05/24 0313  Weight: 61.7 kg 62 kg    Examination:  General exam: Appears calm and comfortable  Respiratory system: unlabored Cardiovascular system: RRR Gastrointestinal system: Abdomen is nondistended, soft and nontender.  Central nervous system: Alert and oriented. No focal neurological deficits. Extremities: no LEE    Data Reviewed: I have personally reviewed following  labs and imaging studies  CBC: Recent Labs  Lab 01/30/24 1323 01/30/24 1737 02/04/24 1233 02/05/24 0443  WBC 2.5* 2.5* 2.8* 2.9*  NEUTROABS 1.2*  --   --  1.3*  HGB 11.4* 11.7* 11.0* 10.0*  HCT 35.2* 36.3 34.2* 30.7*  MCV 80.5 80.8 81.4 81.4  PLT 263 263 284 253    Basic Metabolic Panel: Recent Labs  Lab 01/30/24 1323 01/30/24 1737 02/04/24 1233 02/05/24 0443  NA 140 141 140  139  K 3.9 3.2* 3.4* 3.3*  CL 103 103 106 108  CO2 25 26 24 23   GLUCOSE 84 127* 91 106*  Morales 26* 26* 27* 26*  CREATININE 2.44* 2.66* 2.10* 2.10*  CALCIUM  9.1 9.2 8.9 8.6*    GFR: Estimated Creatinine Clearance: 21.2 mL/min (Olivia Morales) (by C-G formula based on SCr of 2.1 mg/dL (H)).  Liver Function Tests: Recent Labs  Lab 01/30/24 1323  AST 22  ALT 9  ALKPHOS 62  BILITOT 0.4  PROT 6.2*  ALBUMIN 3.6    CBG: Recent Labs  Lab 02/04/24 2115  GLUCAP 150*     No results found for this or any previous visit (from the past 240 hours).       Radiology Studies: CT Head Wo Contrast Result Date: 02/04/2024 CLINICAL DATA:  Headache EXAM: CT HEAD WITHOUT CONTRAST TECHNIQUE: Contiguous axial images were obtained from the base of the skull through the vertex without intravenous contrast. RADIATION DOSE REDUCTION: This exam was performed according to the departmental dose-optimization program which includes automated exposure control, adjustment of the mA and/or kV according to patient size and/or use of iterative reconstruction technique. COMPARISON:  None Available. FINDINGS: Brain: No acute territorial infarction, hemorrhage, or intracranial mass. The ventricles are non enlarged. Vascular: No hyperdense vessels.  Carotid vascular calcification. Skull: Normal. Negative for fracture or focal lesion. Sinuses/Orbits: No acute finding. Other: None IMPRESSION: No CT evidence for acute intracranial abnormality. Electronically Signed   By: Olivia Morales M.D.   On: 02/04/2024 17:30   DG Chest 2 View Result Date: 02/04/2024 EXAM: 2 VIEW(S) XRAY OF THE CHEST 02/04/2024 01:10:35 PM COMPARISON: None available. CLINICAL HISTORY: Hypertension. Patient reports elevated blood pressure this morning, headache and neck pain with some palpitations since approximately 0500 today. FINDINGS: LUNGS AND PLEURA: No focal pulmonary opacity. No pulmonary edema. No pleural effusion. No pneumothorax. HEART AND MEDIASTINUM:  Similar enlarged cardiomediastinal silhouette. BONES AND SOFT TISSUES: No acute osseous abnormality. IMPRESSION: 1. No focal consolidations. 2. Similar cardiomegaly. Electronically signed by: Limin Xu MD 02/04/2024 01:43 PM EDT RP Workstation: [id number]        Scheduled Meds:  amLODipine   10 mg Oral Daily   aspirin  EC  81 mg Oral Daily   atorvastatin   40 mg Oral Daily   cloNIDine   0.1 mg Oral BID   furosemide   40 mg Oral Daily   heparin   5,000 Units Subcutaneous Q8H   hydrALAZINE   100 mg Oral Q8H   sodium chloride  flush  3 mL Intravenous Q12H   Continuous Infusions:   LOS: 1 day    Time spent: over 30 min    Meliton Monte, MD [facility]   To contact the attending provider between 7A-7P or the covering provider during after hours 7P-7A, please log into the web site [email or web address] and access using universal [place] password for that web site. If you do not have the password, please call the hospital operator.  02/05/2024, 9:48 AM

## 2024-02-05 NOTE — TOC Initial Note (Signed)
 Transition of Care [facility]) - Initial/Assessment Note    Patient Details  Name: Olivia Morales MRN: [id number] Date of Birth: 05/09/1951  Transition of Care [facility]) CM/SW Contact:    Sudie Erminio Deems, RN Phone Number: 02/05/2024, 4:12 PM  Clinical Narrative:  Patient presented for hypertensive emergency. PTA patient reports that she is from home with her significant other. Patient states she drives herself to all appointments. Patient is in need of outpatient physical therapy-wants to use the [facility] location [employer]. Patient in need of Rollator and wants to use [facility] [employer]- order submitted for [employer] to deliver. No further home needs identified at this time.                Expected Discharge Plan: OP Rehab Barriers to Discharge: No Barriers Identified   Patient Goals and CMS Choice Patient states their goals for this hospitalization and ongoing recovery are:: Patient to transition home once stable.   Choice offered to / list presented to : NA      Expected Discharge Plan and Services In-house Referral: NA Discharge Planning Services: CM Consult Post Acute Care Choice: NA Living arrangements for the past 2 months: Single Family Home    HH Arranged: NA  Prior Living Arrangements/Services Living arrangements for the past 2 months: Single Family Home Lives with:: Significant Other Patient language and need for interpreter reviewed:: Yes Do you feel safe going back to the place where you live?: Yes      Need for Family Participation in Patient Care: Yes (Comment) Care giver support system in place?: Yes (comment)   Criminal Activity/Legal Involvement Pertinent to Current Situation/Hospitalization: No - Comment as needed  Activities of Daily Living   ADL Screening (condition at time of admission) Independently performs ADLs?: Yes (appropriate for developmental age) Is the patient deaf or have difficulty hearing?: No Does the patient have difficulty  seeing, even when wearing glasses/contacts?: No Does the patient have difficulty concentrating, remembering, or making decisions?: No  Permission Sought/Granted Permission sought to share information with : Case Manager, Family Supports                Emotional Assessment Appearance:: Appears stated age Attitude/Demeanor/Rapport: Engaged Affect (typically observed): Appropriate Orientation: : Oriented to Situation, Oriented to  Time, Oriented to Place, Oriented to Self Alcohol / Substance Use: Not Applicable Psych Involvement: No (comment)  Admission diagnosis:  Hypertensive emergency [I16.1] Patient Active Problem List   Diagnosis Date Noted   Hypertensive emergency 02/04/2024   CKD (chronic kidney disease), stage IV (HCC) 02/04/2024   Chronic leukopenia 02/04/2024   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  12/04/2023   Chronic heart failure with preserved ejection fraction (HFpEF) (HCC) 11/01/2023   Aortic stenosis 09/20/2023   Plantar fasciitis 09/19/2023   OSA (obstructive sleep apnea) 12/21/2021   Hypercalcemia 06/15/2021   Disorder of mineral metabolism, unspecified 06/15/2021   Anemia 02/15/2021   Primary osteoarthritis of both knees 02/14/2021   Carpal tunnel syndrome, bilateral 01/05/2021   Left foot pain 01/05/2021   Left ankle injury, subsequent encounter 12/19/2017   Noncompliance with treatment 12/13/2017   Class 2 severe obesity due to excess calories with serious comorbidity and body mass index (BMI) of 35.0 to 35.9 in adult (HCC) 03/05/2017   Hypersomnia with sleep apnea 02/08/2016   Nocturia more than twice per night 02/08/2016   Noncompliance with CPAP treatment 02/08/2016   CKD (chronic kidney disease), stage III (HCC) 01/11/2014   Hyperlipidemia associated with type 2  diabetes mellitus (HCC) 09/14/2013   Hypertension associated with diabetes (HCC) 10/31/2011   Diabetes mellitus (HCC) 10/31/2011   Benign essential hypertension  10/31/2011   PCP:  Dorina Loving, PA-C Pharmacy:   [facility] [place] - [facility] [address], [address] [place] [address] Phone: [phone] Fax: [phone]  [facility] - [place], [place] - [address] [place]  [address] Phone: [phone] Fax: [phone]  [id number] - [place], [place] - [address] AT [facility] OF [address] [place] [address] Phone: [phone] Fax: [phone]     Social Drivers of Health (SDOH) Social History: SDOH Screenings   Food Insecurity: No Food Insecurity (02/04/2024)  Housing: Low Risk  (02/04/2024)  Transportation Needs: No Transportation Needs (02/04/2024)  Utilities: Not At Risk (02/04/2024)  Alcohol Screen: Low Risk  (12/26/2023)  Depression (PHQ2-9): Medium Risk (02/04/2024)  Financial Resource Strain: Low Risk  (12/26/2023)  Physical Activity: Inactive (12/26/2023)  Social Connections: Unknown (02/05/2024)  Recent Concern: Social Connections - Moderately Isolated (02/04/2024)  Stress: No Stress Concern Present (12/26/2023)  Tobacco Use: Low Risk  (02/05/2024)  Health Literacy: Adequate Health Literacy (12/26/2023)   SDOH Interventions:     Readmission Risk Interventions     No data to display

## 2024-02-06 ENCOUNTER — Inpatient Hospital Stay

## 2024-02-06 ENCOUNTER — Inpatient Hospital Stay (HOSPITAL_COMMUNITY)

## 2024-02-06 ENCOUNTER — Other Ambulatory Visit (HOSPITAL_COMMUNITY): Payer: Self-pay

## 2024-02-06 ENCOUNTER — Ambulatory Visit: Admitting: Medical Oncology

## 2024-02-06 DIAGNOSIS — I10 Essential (primary) hypertension: Secondary | ICD-10-CM | POA: Diagnosis not present

## 2024-02-06 DIAGNOSIS — I161 Hypertensive emergency: Secondary | ICD-10-CM | POA: Diagnosis not present

## 2024-02-06 LAB — CBC WITH DIFFERENTIAL/PLATELET
Basophils Absolute: 0.1 K/uL (ref 0.0–0.1)
Basophils Relative: 2 %
Eosinophils Absolute: 0 K/uL (ref 0.0–0.5)
Eosinophils Relative: 0 %
HCT: 33.3 % — ABNORMAL LOW (ref 36.0–46.0)
Hemoglobin: 10.6 g/dL — ABNORMAL LOW (ref 12.0–15.0)
Lymphocytes Relative: 49 %
Lymphs Abs: 1.3 K/uL (ref 0.7–4.0)
MCH: 25.7 pg — ABNORMAL LOW (ref 26.0–34.0)
MCHC: 31.8 g/dL (ref 30.0–36.0)
MCV: 80.8 fL (ref 80.0–100.0)
Monocytes Absolute: 0.2 K/uL (ref 0.1–1.0)
Monocytes Relative: 9 %
Neutro Abs: 1 K/uL — ABNORMAL LOW (ref 1.7–7.7)
Neutrophils Relative %: 40 %
Platelets: 287 K/uL (ref 150–400)
RBC: 4.12 MIL/uL (ref 3.87–5.11)
RDW: 14.8 % (ref 11.5–15.5)
WBC: 2.6 K/uL — ABNORMAL LOW (ref 4.0–10.5)
nRBC: 0 % (ref 0.0–0.2)

## 2024-02-06 LAB — COMPREHENSIVE METABOLIC PANEL WITH GFR
ALT: 10 U/L (ref 0–44)
AST: 15 U/L (ref 15–41)
Albumin: 2.6 g/dL — ABNORMAL LOW (ref 3.5–5.0)
Alkaline Phosphatase: 46 U/L (ref 38–126)
Anion gap: 7 (ref 5–15)
BUN: 26 mg/dL — ABNORMAL HIGH (ref 8–23)
CO2: 25 mmol/L (ref 22–32)
Calcium: 8.8 mg/dL — ABNORMAL LOW (ref 8.9–10.3)
Chloride: 107 mmol/L (ref 98–111)
Creatinine, Ser: 2.09 mg/dL — ABNORMAL HIGH (ref 0.44–1.00)
GFR, Estimated: 25 mL/min — ABNORMAL LOW (ref >60)
Glucose, Bld: 90 mg/dL (ref 70–99)
Potassium: 3.7 mmol/L (ref 3.5–5.1)
Sodium: 139 mmol/L (ref 135–145)
Total Bilirubin: 0.5 mg/dL (ref 0.0–1.2)
Total Protein: 5.6 g/dL — ABNORMAL LOW (ref 6.5–8.1)

## 2024-02-06 LAB — PHOSPHORUS: Phosphorus: 3.4 mg/dL (ref 2.5–4.6)

## 2024-02-06 LAB — MAGNESIUM: Magnesium: 1.7 mg/dL (ref 1.7–2.4)

## 2024-02-06 MED ORDER — CLONIDINE HCL 0.1 MG PO TABS: 0.1000 mg | ORAL_TABLET | Freq: Two times a day (BID) | ORAL | Status: DC

## 2024-02-06 MED ORDER — HYDRALAZINE HCL 100 MG PO TABS
100.0000 mg | ORAL_TABLET | Freq: Three times a day (TID) | ORAL | 1 refills | Status: DC
  Filled 2024-02-06: qty 90, 30d supply, fill #0
  Filled 2024-03-19: qty 90, 30d supply, fill #1

## 2024-02-06 MED ORDER — AMLODIPINE BESYLATE 10 MG PO TABS: 10.0000 mg | ORAL_TABLET | Freq: Every day | ORAL | Status: AC

## 2024-02-06 MED ORDER — FUROSEMIDE 40 MG PO TABS
40.0000 mg | ORAL_TABLET | Freq: Every day | ORAL | 1 refills | Status: DC
  Filled 2024-02-06 – 2024-03-31 (×2): qty 30, 30d supply, fill #0

## 2024-02-06 NOTE — Plan of Care (Signed)
   Problem: Health Behavior/Discharge Planning: Goal: Ability to manage health-related needs will improve Outcome: Progressing   Problem: Clinical Measurements: Goal: Ability to maintain clinical measurements within normal limits will improve Outcome: Progressing

## 2024-02-06 NOTE — Discharge Summary (Signed)
 Physician Discharge Summary  Olivia Morales FMW:[id number] DOB: [birth year] DOA: 02/04/2024  PCP: Dorina Loving, PA-C  Admit date: 02/04/2024 Discharge date: 02/06/2024  Time spent: 40 minutes  Recommendations for Outpatient Follow-up:  Follow outpatient CBC/CMP  Follow renin/aldo level outpatient  Needs compliance with CPAP - repeat outpatient sleep study if needed Continue to titrate meds as able, watch HR with clonidine  (bradycardic here) Needs renal follow up outpatient for proteinuria and hypertension and CKD  Alendronate  d/c'd due to renal function, follow outpatient   Discharge Diagnoses:  Principal Problem:   Hypertensive emergency Active Problems:   Hyperlipidemia associated with type 2 diabetes mellitus (HCC)   OSA (obstructive sleep apnea)   Chronic heart failure with preserved ejection fraction (HFpEF) (HCC)   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    CKD (chronic kidney disease), stage IV (HCC)   Chronic leukopenia   Discharge Condition: stable  Diet recommendation: heart healthy   Filed Weights   02/04/24 2114 02/05/24 0313 02/06/24 0500  Weight: 61.7 kg 62 kg 62.2 kg    History of present illness:  Olivia Morales is Olivia Morales 73 y.o. female with medical history significant for chronic HFpEF, CKD stage IV, uncontrolled HTN, HLD, anemia of chronic disease, chronic leukopenia, sickle cell trait, OSA not using CPAP who is admitted with hypertensive emergency.  She was restarted on her home BP meds.  BP improved, but still elevated at time of discharge.  She's asymptomatic.  See below for additional details.   Hospital Course:  Assessment and Plan:  Hypertensive Emergency SBP to the 230's at presentation Elevated troponin, flat - due to demand, not c/w ACS Elevated pro BNP - not overloaded on exam, follow CXR (without active disease) Home BP meds resumed -> hydralazine  100 mg TID, clonidine  0.1 mg BID, amlodipine  10 mg daily, lasix   40 mg daily.  Holding metoprolol  due to bradycardia.  No room to titrate clonidine  due to bradycardia (will leave Desree Leap current dose as she's asymptomatic) - BP remains elevated at discharge, follow outpatient BP's still uncontrolled today.  She notes missing BP meds occasionally at home (encouraged to use pill box). Workup for secondary causes of hypertension: TSH 12/2023 wnl.  Renin/aldo level pending.  Renal artery US  without evidence renal artery stenosis.  Echo 10/2023 with EF 60-65%, no RWMA, grade II diastolic dysfunction. Needs compliance with meds and CPAP   Headache Concern that HA is related to elevated BP above.  Tension like features.  No vision changes.  Head CT without acute intracranial abnormalities.   HA resolves with tylenol . Needs to use CPAP    Bradycardia Continue clonidine  at current dose, she's asymptomatic Holding metoprolol , monitor HR    HFpEF Continue jardiance Metoprolol  on hold as above   CKD IV  Proteinuria Creatinine appears close to baseline UP/C 2.7 Will monitor   Hypokalemia Replace and follow    Leukopenia Chronic, follow outpatient  Smear with unremarkable morphology on tech review   Anemia of chronic disease Gets aranesp  every 3 weeks   Hyperlipidemia Statin   OSA Not using CPAP - needs to use and/or update sleep study  Holding alendronate  at discharge due to renal function      Procedures: Renal artery US  Summary:  Renal:    Right: Normal size right kidney. No evidence of right renal artery         stenosis.  Left:  Normal size of left kidney. No evidence of left renal artery  stenosis. Cyst(s) noted.    Consultations: none  Discharge Exam: Vitals:   02/06/24 1041 02/06/24 1100  BP: (!) 172/93 (!) 156/72  Pulse:  60  Resp:  17  Temp:  97.7 F (36.5 C)  SpO2:     No complaints HA this AM, resolved after tylenol   General: No acute distress. Cardiovascular: Heart sounds show Shakil Dirk regular rate, and rhythm.   Lungs: Clear to auscultation bilaterally  Abdomen: Soft, nontender, nondistended  Neurological: Alert and oriented 3. Moves all extremities 4 with equal strength. Cranial nerves II through XII grossly intact. Extremities: No clubbing or cyanosis. No edema.   Discharge Instructions   Discharge Instructions     (HEART FAILURE PATIENTS) Call MD:  Anytime you have any of the following symptoms: 1) 3 pound weight gain in 24 hours or 5 pounds in 1 week 2) shortness of breath, with or without Kian Ottaviano dry hacking cough 3) swelling in the hands, feet or stomach 4) if you have to sleep on extra pillows at night in order to breathe.   Complete by: As directed    Ambulatory referral to Physical Therapy   Complete by: As directed    Outpatient Physical Therapy evaluation and treatment   Call MD for:  difficulty breathing, headache or visual disturbances   Complete by: As directed    Call MD for:  extreme fatigue   Complete by: As directed    Call MD for:  hives   Complete by: As directed    Call MD for:  persistant dizziness or light-headedness   Complete by: As directed    Call MD for:  persistant nausea and vomiting   Complete by: As directed    Call MD for:  redness, tenderness, or signs of infection (pain, swelling, redness, odor or green/yellow discharge around incision site)   Complete by: As directed    Call MD for:  severe uncontrolled pain   Complete by: As directed    Call MD for:  temperature >100.4   Complete by: As directed    Diet - low sodium heart healthy   Complete by: As directed    Discharge instructions   Complete by: As directed    You were seen for high blood pressure.   I think this is partially to you not taking your medicine as prescribed.  We've prescribed amlodipine  in addition to your clonidine  and hydralazine .  Continue your lasix .  STOP your metoprolol  because your heart rate is slow.  You should have an outpatient sleep study.   We've sent workup for secondary  causes of high blood pressure.  Your renal artery ultrasound was reassuring.  You have pending lab work that should be followed as an outpatient (Johari Bennetts renin/aldosterone level).  You have significant protein in your urine and chronic kidney disease.  You should follow with your PCP and Edyn Popoca kidney doctor for this as an outpatient.    Your headaches may be related to your elevated blood pressure.  Caffeine may also be an issue.  Try to moderate this.  You should have an outpatient sleep study.    Stop all NSAIDs (ibuprofen , aleve, naproxen, meloxicam , etc) as these can raise blood pressure and cause issues in the setting of chronic kidney disease.   Stop your alendronate  based on your kidney function.  Follow up with your PCP to determine whether this can be resumed.   Return for new, recurrent, or worsening symptoms.  Please ask your PCP to request records from this  hospitalization so they know what was done and what the next steps will be.   Increase activity slowly   Complete by: As directed       Allergies as of 02/06/2024       Reactions   Ace Inhibitors Cough        Medication List     STOP taking these medications    alendronate  10 MG tablet Commonly known as: FOSAMAX    ibuprofen  600 MG tablet Commonly known as: ADVIL    metoprolol  succinate 50 MG 24 hr tablet Commonly known as: TOPROL -XL       TAKE these medications    amLODipine  10 MG tablet Commonly known as: NORVASC  Take 1 tablet (10 mg total) by mouth daily. Start taking on: February 07, 2024   aspirin  EC 81 MG tablet Take 81 mg by mouth daily. Swallow whole.   atorvastatin  40 MG tablet Commonly known as: LIPITOR Take 1 tablet (40 mg total) by mouth daily.   cloNIDine  0.1 MG tablet Commonly known as: CATAPRES  Take 1 tablet (0.1 mg total) by mouth 2 (two) times daily. What changed: when to take this   diazepam  5 MG tablet Commonly known as: VALIUM  Take 1 tablet (5 mg total) by mouth 2 (two) times daily.    empagliflozin 10 MG Tabs tablet Commonly known as: JARDIANCE Take 10 mg by mouth daily.   ferrous sulfate 325 (65 FE) MG tablet Take 325 mg by mouth daily with breakfast.   furosemide  40 MG tablet Commonly known as: LASIX  Take 1 tablet (40 mg total) by mouth daily.   hydrALAZINE  100 MG tablet Commonly known as: APRESOLINE  Take 1 tablet (100 mg total) by mouth 3 (three) times daily.   lidocaine  5 % Commonly known as: Lidoderm  Place 1 patch onto the skin daily. Remove & Discard patch within 12 hours or as directed by MD   omeprazole  20 MG capsule Commonly known as: PRILOSEC Take 20 mg by mouth daily.   OneTouch Delica Plus Lancet33G Misc USE TO TEST 1-2 TIMES DAILY   OneTouch Verio test strip Generic drug: glucose blood Check blood sugar 1-2 times daily   potassium chloride  10 MEQ CR capsule Commonly known as: MICRO-K  Take 10 mEq by mouth 2 (two) times daily.   VITAMIN B-12 PO Take 1 tablet by mouth once Harshaan Whang week.               Durable Medical Equipment  (From admission, onward)           Start     Ordered   02/05/24 1609  For home use only DME 4 wheeled rolling walker with seat  Once       Question:  Patient needs Sheron Tallman walker to treat with the following condition  Answer:  General weakness   02/05/24 1608           Allergies  Allergen Reactions   Ace Inhibitors Cough    Follow-up Information      Outpatient Rehabilitation at [facility] Follow up.   Specialty: Rehabilitation Why: Outpatient Physical THerapy-office to call you with Gavin Telford visit time. If the office has not called within 3-5 business days; please call the office. Contact information: [address]  [address] [place]  [address] [phone]                 The results of significant diagnostics from this hospitalization (including imaging, microbiology, ancillary and laboratory) are listed below for reference.  Significant  Diagnostic Studies: VAS US  RENAL ARTERY DUPLEX Result Date: 02/06/2024 ABDOMINAL VISCERAL Patient Name:  Olivia Morales  Date of Exam:   02/06/2024 Medical Rec #: [id number]              Accession #:    [id number] Date of Birth: 02-Oct-1950              Patient Gender: F Patient Age:   73 years Exam Location:  [facility] Procedure:      VAS US  RENAL ARTERY DUPLEX Referring Phys: [id number] Hutchinson Isenberg CALDWELL POWELL JR -------------------------------------------------------------------------------- Indications: Hypertension High Risk Factors: Hypertension. Performing Technologist: Elmarie Lindau, RVT  Examination Guidelines: Sakshi Sermons complete evaluation includes B-mode imaging, spectral Doppler, color Doppler, and power Doppler as needed of all accessible portions of each vessel. Bilateral testing is considered an integral part of Kaelie Henigan complete examination. Limited examinations for reoccurring indications may be performed as noted.  Duplex Findings: +----------+--------+--------+------+--------+ MesentericPSV cm/sEDV cm/sPlaqueComments +----------+--------+--------+------+--------+ Aorta Prox   76                          +----------+--------+--------+------+--------+    +------------------+--------+--------+-------+ Right Renal ArteryPSV cm/sEDV cm/sComment +------------------+--------+--------+-------+ Proximal             53      11           +------------------+--------+--------+-------+ Mid                  53      11           +------------------+--------+--------+-------+ Distal               39      8            +------------------+--------+--------+-------+ +-----------------+--------+--------+-------+ Left Renal ArteryPSV cm/sEDV cm/sComment +-----------------+--------+--------+-------+ Proximal            49      10           +-----------------+--------+--------+-------+ Mid                 70      18           +-----------------+--------+--------+-------+ Distal               31      6            +-----------------+--------+--------+-------+  Technologist observations: There are multiple filled filled appearing areas on the left kidney with 1.88 x 2.29 cm being the maximum diameter. +------------+--------+--------+----+-----------+--------+--------+----+ Right KidneyPSV cm/sEDV cm/sRI  Left KidneyPSV cm/sEDV cm/sRI   +------------+--------+--------+----+-----------+--------+--------+----+ Upper Pole  10      3       0.70Upper Pole 7       3       0.57 +------------+--------+--------+----+-----------+--------+--------+----+ Mid         19      4       0.        8       3       0.62 +------------+--------+--------+----+-----------+--------+--------+----+ Lower Pole  12      3       0.75Lower Pole                      +------------+--------+--------+----+-----------+--------+--------+----+ Hilar                           Hilar                           +------------+--------+--------+----+-----------+--------+--------+----+ +------------------+-----+------------------+-----+  Right Kidney           Left Kidney             +------------------+-----+------------------+-----+ RAR                    RAR                     +------------------+-----+------------------+-----+ RAR (manual)      0.7  RAR (manual)      0.9   +------------------+-----+------------------+-----+ Cortex                 Cortex                  +------------------+-----+------------------+-----+ Cortex thickness       Corex thickness         +------------------+-----+------------------+-----+ Kidney length (cm)10.89Kidney length (cm)10.17 +------------------+-----+------------------+-----+   Summary: Renal:  Right: Normal size right kidney. No evidence of right renal artery        stenosis. Left:  Normal size of left kidney. No evidence of left renal artery        stenosis. Cyst(s) noted.  *See table(s) above for measurements and  observations.     Preliminary    DG CHEST PORT 1 VIEW Result Date: 02/05/2024 CLINICAL DATA:  Bilateral lower extremity swelling.  Hypertension. EXAM: PORTABLE CHEST 1 VIEW COMPARISON:  February 04, 2024. FINDINGS: Stable cardiomegaly. Both lungs are clear. The visualized skeletal structures are unremarkable. IMPRESSION: No active disease. Electronically Signed   By: Lynwood Landy Raddle M.D.   On: 02/05/2024 14:42   CT Head Wo Contrast Result Date: 02/04/2024 CLINICAL DATA:  Headache EXAM: CT HEAD WITHOUT CONTRAST TECHNIQUE: Contiguous axial images were obtained from the base of the skull through the vertex without intravenous contrast. RADIATION DOSE REDUCTION: This exam was performed according to the departmental dose-optimization program which includes automated exposure control, adjustment of the mA and/or kV according to patient size and/or use of iterative reconstruction technique. COMPARISON:  None Available. FINDINGS: Brain: No acute territorial infarction, hemorrhage, or intracranial mass. The ventricles are non enlarged. Vascular: No hyperdense vessels.  Carotid vascular calcification. Skull: Normal. Negative for fracture or focal lesion. Sinuses/Orbits: No acute finding. Other: None IMPRESSION: No CT evidence for acute intracranial abnormality. Electronically Signed   By: Luke Bun M.D.   On: 02/04/2024 17:30   DG Chest 2 View Result Date: 02/04/2024 EXAM: 2 VIEW(S) XRAY OF THE CHEST 02/04/2024 01:10:35 PM COMPARISON: None available. CLINICAL HISTORY: Hypertension. Patient reports elevated blood pressure this morning, headache and neck pain with some palpitations since approximately 0500 today. FINDINGS: LUNGS AND PLEURA: No focal pulmonary opacity. No pulmonary edema. No pleural effusion. No pneumothorax. HEART AND MEDIASTINUM: Similar enlarged cardiomediastinal silhouette. BONES AND SOFT TISSUES: No acute osseous abnormality. IMPRESSION: 1. No focal consolidations. 2. Similar cardiomegaly.  Electronically signed by: Manford Cummins MD 02/04/2024 01:43 PM EDT RP Workstation: [id number]   DG Chest 2 View Result Date: 01/30/2024 CLINICAL DATA:  Elevated blood pressure. Patient is seen at the cancer center. EXAM: CHEST - 2 VIEW COMPARISON:  02/19/2024 FINDINGS: Heart size and pulmonary vascularity are normal. Lungs are clear. No pleural effusion or pneumothorax. Mediastinal contours appear intact. Calcified and tortuous aorta. IMPRESSION: No active cardiopulmonary disease. Electronically Signed   By: Elsie Gravely M.D.   On: 01/30/2024 18:09    Microbiology: No results found for this or any previous visit (from the past 240 hours).   Labs: Basic Metabolic Panel: Recent  Labs  Lab 01/30/24 1323 01/30/24 1737 02/04/24 1233 02/05/24 0443 02/06/24 0453  NA 140 141 140 139 139  K 3.9 3.2* 3.4* 3.3* 3.7  CL 103 103 106 108 107  CO2 25 26 24 23 25   GLUCOSE 84 127* 91 106* 90  BUN 26* 26* 27* 26* 26*  CREATININE 2.44* 2.66* 2.10* 2.10* 2.09*  CALCIUM  9.1 9.2 8.9 8.6* 8.8*  MG  --   --   --   --  1.7  PHOS  --   --   --   --  3.4   Liver Function Tests: Recent Labs  Lab 01/30/24 1323 02/06/24 0453  AST 22 15  ALT 9 10  ALKPHOS 62 46  BILITOT 0.4 0.5  PROT 6.2* 5.6*  ALBUMIN 3.6 2.6*   No results for input(s): LIPASE, AMYLASE in the last 168 hours. No results for input(s): AMMONIA in the last 168 hours. CBC: Recent Labs  Lab 01/30/24 1323 01/30/24 1737 02/04/24 1233 02/05/24 0443 02/06/24 0453  WBC 2.5* 2.5* 2.8* 2.9* 2.6*  NEUTROABS 1.2*  --   --  1.3* 1.0*  HGB 11.4* 11.7* 11.0* 10.0* 10.6*  HCT 35.2* 36.3 34.2* 30.7* 33.3*  MCV 80.5 80.8 81.4 81.4 80.8  PLT 263 263 284 253 287   Cardiac Enzymes: No results for input(s): CKTOTAL, CKMB, CKMBINDEX, TROPONINI in the last 168 hours. BNP: BNP (last 3 results) Recent Labs    10/28/23 1639  BNP 123*    ProBNP (last 3 results) Recent Labs    09/03/23 1231 11/04/23 1503 02/04/24 1630  PROBNP  417.0* 395.0* 8,690.0*    CBG: Recent Labs  Lab 02/04/24 2115  GLUCAP 150*       Signed:  Meliton Monte MD.  [facility] 02/06/2024, 12:14 PM

## 2024-02-06 NOTE — Progress Notes (Signed)
 Renal art exam is completed. Olivia Morales, RVT

## 2024-02-07 ENCOUNTER — Telehealth: Payer: Self-pay

## 2024-02-07 NOTE — Telephone Encounter (Unsigned)
 Copied from CRM [id number]. Topic: Clinical - Medical Advice >> Feb 07, 2024 12:20 PM Mercedes MATSU wrote: Reason for CRM: Patient called in stating that she was recently released from the hospital and she wanted to request a sleep study referral. The patient is also requesting a medication that they did not give her in the hospital which is ferrous sulfate 325 (65 FE) MG tablet. Patient is requesting a call back and be reached at [phone].

## 2024-02-07 NOTE — Transitions of Care (Post Inpatient/ED Visit) (Signed)
 02/07/2024  Name: Olivia Morales MRN: [id number] DOB: Sep 05, 1950  Today's TOC FU Call Status: Today's TOC FU Call Status:: Successful TOC FU Call Completed TOC FU Call Complete Date: 02/07/24 Patient's Name and Date of Birth confirmed.  Transition Care Management Follow-up Telephone Call Date of Discharge: 02/06/24 Discharge Facility: [facility]) Type of Discharge: Inpatient Admission Primary Inpatient Discharge Diagnosis:: hypertension How have you been since you were released from the hospital?: Better Any questions or concerns?: No  Items Reviewed: Did you receive and understand the discharge instructions provided?: Yes Medications obtained,verified, and reconciled?: Yes (Medications Reviewed) Any new allergies since your discharge?: No Dietary orders reviewed?: Yes Do you have support at home?: Yes People in Home [RPT]: significant other  Medications Reviewed Today: Medications Reviewed Today     Reviewed by Emmitt Pan, LPN (Licensed Practical Nurse) on 02/07/24 at 1150  Med List Status: <None>   Medication Order Taking? Sig Documenting Provider Last Dose Status Informant  amLODipine  (NORVASC ) 10 MG tablet [id number] Yes Take 1 tablet (10 mg total) by mouth daily. Perri DELENA Meliton Mickey., MD  Active   aspirin  EC 81 MG tablet [id number] Yes Take 81 mg by mouth daily. Swallow whole. [provider]  Active Self, Family Member  atorvastatin  (LIPITOR) 40 MG tablet [id number] Yes Take 1 tablet (40 mg total) by mouth daily. Saguier, [place], PA-C  Active Self, Family Member  cloNIDine  (CATAPRES ) 0.1 MG tablet [id number] Yes Take 1 tablet (0.1 mg total) by mouth 2 (two) times daily. Perri DELENA Meliton Mickey., MD  Active   Cyanocobalamin  (VITAMIN B-12 PO) [id number] Yes Take 1 tablet by mouth once a week. [provider]  Active Self, Family Member  diazepam  (VALIUM ) 5 MG tablet [id number] Yes Take 1 tablet (5 mg total) by mouth 2 (two) times daily. Theotis Peers M, PA-C  Active Self, Family Member  empagliflozin (JARDIANCE) 10 MG TABS tablet [id number] Yes Take 10 mg by mouth daily. [provider]  Active Self, Family Member  ferrous sulfate 325 (65 FE) MG tablet [id number] Yes Take 325 mg by mouth daily with breakfast. [provider]  Active Self, Family Member  furosemide  (LASIX ) 40 MG tablet [id number] Yes Take 1 tablet (40 mg total) by mouth daily. Perri DELENA Meliton Mickey., MD  Active   hydrALAZINE  (APRESOLINE ) 100 MG tablet [id number] Yes Take 1 tablet (100 mg total) by mouth 3 (three) times daily. Perri DELENA Meliton Mickey., MD  Active   Lancets [facility] CATHRYNE PLUS [place]) [place] [id number] Yes USE TO TEST 1-2 TIMES DAILY Saguier, [place], PA-C  Active Self, Family Member  lidocaine  (LIDODERM ) 5 % [id number] Yes Place 1 patch onto the skin daily. Remove & Discard patch within 12 hours or as directed by MD Theotis Peers HERO, PA-C  Active Self, Family Member  omeprazole  (PRILOSEC) 20 MG capsule [id number] Yes Take 20 mg by mouth daily. [provider]  Active Self, Family Member  [facility] VERIO test strip [id number] Yes Check blood sugar 1-2 times daily Saguier, [place], PA-C  Active Self, Family Member  potassium chloride  (MICRO-K ) 10 MEQ CR capsule [id number] Yes Take 10 mEq by mouth 2 (two) times daily. [provider]  Active Self, Family Member            Home Care and Equipment/Supplies: Were Home Health Services Ordered?: NA Any new equipment or medical supplies ordered?: NA  Functional Questionnaire: Do you need assistance with bathing/showering or dressing?: No Do you need assistance with meal preparation?: No  Do you need assistance with eating?: No Do you have difficulty maintaining continence: No Do you need assistance with getting out of bed/getting out of a chair/moving?: No Do you have difficulty managing or taking your medications?: No  Follow up appointments reviewed: PCP Follow-up appointment  confirmed?: No (declined appt , will call back to schedule) Specialist Hospital Follow-up appointment confirmed?: Yes Date of Specialist follow-up appointment?: 02/27/24 Follow-Up Specialty Provider:: Lauraine Dais Do you need transportation to your follow-up appointment?: No Do you understand care options if your condition(s) worsen?: Yes-patient verbalized understanding    SIGNATURE Julian Lemmings, LPN [facility] Nurse Health Advisor Direct Dial [phone]

## 2024-02-08 LAB — ALDOSTERONE + RENIN ACTIVITY W/ RATIO
ALDO / PRA Ratio: 32.1 — ABNORMAL HIGH (ref 0.0–30.0)
Aldosterone: 7.6 ng/dL (ref 0.0–30.0)
PRA LC/MS/MS: 0.237 ng/mL/h (ref 0.167–5.380)

## 2024-02-13 NOTE — Addendum Note (Signed)
 Addended by: DORINA DALLAS HERO on: 02/13/2024 07:58 PM   Modules accepted: Orders

## 2024-02-14 ENCOUNTER — Other Ambulatory Visit (HOSPITAL_BASED_OUTPATIENT_CLINIC_OR_DEPARTMENT_OTHER): Payer: Self-pay

## 2024-02-14 ENCOUNTER — Encounter: Payer: Self-pay | Admitting: Medical

## 2024-02-14 ENCOUNTER — Ambulatory Visit: Payer: Self-pay | Admitting: Medical

## 2024-02-14 ENCOUNTER — Ambulatory Visit: Admitting: Medical

## 2024-02-14 ENCOUNTER — Ambulatory Visit (HOSPITAL_BASED_OUTPATIENT_CLINIC_OR_DEPARTMENT_OTHER)
Admission: RE | Admit: 2024-02-14 | Discharge: 2024-02-14 | Disposition: A | Discharge status: Home / Self Care | Source: Ambulatory Visit | Attending: Medical | Admitting: Medical

## 2024-02-14 ENCOUNTER — Ambulatory Visit: Admitting: Cardiology

## 2024-02-14 VITALS — BP 155/80 | HR 70 | Temp 98.6°F | Resp 16 | Ht 64.0 in | Wt 128.0 lb

## 2024-02-14 DIAGNOSIS — I10 Essential (primary) hypertension: Secondary | ICD-10-CM | POA: Diagnosis not present

## 2024-02-14 DIAGNOSIS — I509 Heart failure, unspecified: Secondary | ICD-10-CM | POA: Diagnosis present

## 2024-02-14 DIAGNOSIS — R350 Frequency of micturition: Secondary | ICD-10-CM

## 2024-02-14 DIAGNOSIS — R748 Abnormal levels of other serum enzymes: Secondary | ICD-10-CM | POA: Diagnosis not present

## 2024-02-14 DIAGNOSIS — N189 Chronic kidney disease, unspecified: Secondary | ICD-10-CM

## 2024-02-14 DIAGNOSIS — L989 Disorder of the skin and subcutaneous tissue, unspecified: Secondary | ICD-10-CM

## 2024-02-14 DIAGNOSIS — D649 Anemia, unspecified: Secondary | ICD-10-CM

## 2024-02-14 DIAGNOSIS — R630 Anorexia: Secondary | ICD-10-CM | POA: Diagnosis not present

## 2024-02-14 MED ORDER — ISOSORBIDE MONONITRATE ER 30 MG PO TB24
30.0000 mg | ORAL_TABLET | Freq: Every day | ORAL | 0 refills | Status: DC
Start: 2024-02-14 — End: 2024-03-19
  Filled 2024-02-14: qty 30, 30d supply, fill #0

## 2024-02-14 NOTE — Patient Instructions (Addendum)
 Hypertensive heart disease with heart failure Blood pressure fluctuates between 137/80 and 200/90(most of time 150/90 range recently). Elevated troponin levels due to hypertension. Imdur  30 mg recommended by cardiologist. Goal to reduce blood pressure to 140-150 mmHg. -hydralazine  100 mg TID, clonidine  0.1 mg BID, amlodipine  10 mg daily, lasix  40 mg daily. - Prescribe Imdur  30 mg extended release.(Add on)(benefit vs risk of med discussed. Explained with intermittent bp up to 200/90 best to start to reduce cardiovascular risk) - Check blood pressure daily and report readings on Monday or Tuesday. - Order metabolic panel and CBC. - Order heart failure protein study. - Order chest x-ray to assess for pulmonary edema.  Chronic kidney disease Renal ultrasound normal. Advised to again avoid ibuprofen  due to nephrotoxicity risk. - Avoid ibuprofen . - Follow up with nephrologist to confirm appointment.  Suspected obstructive sleep apnea Referral to pulmonologist for CPAP evaluation. Previous CPAP use was uncomfortable. Newer models may improve compliance and assist in blood pressure control. - Provide referral to pulmonologist for sleep apnea evaluation. - Include pulmonologist contact information in after-visit summary.  Unintentional weight loss and elevated pancreatic enzymes under evaluation Weight loss and elevated pancreatic enzymes under evaluation. CA 19-9 normal. Referral to GI specialist for further evaluation. - Provide referral to GI specialist for evaluation of elevated lipase and weight loss. - Include GI specialist contact information in after-visit summary.  Frequent urination Frequent urination possibly related to Lasix  use. - Collect urine sample on Monday for evaluation.(today if possible)  Right buttock hyperpigmented skin lesion Hyperpigmented, dry, thickened skin lesion on right buttock, possible friction-related hyperpigmentation. - Apply Palmer's moisturizing lotion twice  daily. - Provide referral to dermatologist for further evaluation.   Follow up date to be determined after lab review.       [facility] Out[facility] End[facility] - [facility] [address] [place], [place] [address] Contact Us   P: [phone] F: [phone]   Referring To Provider Information [facility] CARE [address] [place] [address] [phone]   Referral Start Date: 02/13/2024 Referral End Date: 02/12/2025

## 2024-02-14 NOTE — Telephone Encounter (Signed)
 Pt has apt today and will discuss results

## 2024-02-14 NOTE — Addendum Note (Signed)
 Addended by: DORINA DALLAS HERO on: 02/14/2024 04:34 PM   Modules accepted: Orders

## 2024-02-14 NOTE — Progress Notes (Signed)
 Subjective:    Patient ID: Olivia Morales, female    DOB: 29-Apr-1951, 73 y.o.   MRN: [id number]  HPI Pt in for follow up post hospitaliztion.   Last visit with me hpi below before went to ED and then was eventually hospitalized.  Admit date: 02/04/2024 Discharge date: 02/06/2024  Olivia Morales is a 73 year old female with hypertension who presents high blood pressure. I had explained I  am concerned about elevated troponin levels and high blood pressure.   Her troponin level was elevated to 46 today at the emergency department today.( I don't see second check and note state left without being seen). She states she was not informed of troponin elevation while at the hospital and only learned about it only when I told her.  She left because she did not want to miss appointment with me and states was waiting too long. She expresses frustration about not being taken back for further evaluation despite the elevated troponin level. I am not sure of how things played out down stairs.    Charge nurse states described pt was going to get 2nd troponin, but pt left. Charge nurse readily agreed to resume work up.   She has a history of hypertension with recent blood pressure readings as high as 234/105 and 238/200. She states she has been taking hydralazine  every eight hours, setting an alarm to ensure timely doses, butshe states it is not effectively controlling her blood pressure(other bp meds on her list as well but she does not report taking those)   . She mentions a previous instance where her blood pressure was 220/200 at a hematology cancer center, and she was sent home despite the high reading. I called  that thursday afternoon regarding her high bp  at hematologist office. That day she did not take bp med early in the day. When I saw her bp was so high which was over 200 systolic I advised to go to ED. She stated at that time she would take her bp and then go to ED. That note is in  epic.   She feels tired of frequent visits to the emergency room  I had advise on AVS last visit with   Hypertensive in emergency range (though my repeat check was mildly better) with elevated troponin Elevated troponin at 46 suggests cardiac stress, possibly due to demand ischemia from high blood pressure (234/105(question MI though not having chest pain). Hospital admission may be necessary based on troponin trends. - Return to emergency department for repeat troponin testing and monitoring. - Provide visit summary to charge nurse upon return. - Notify triage staff to inform charge nurse of her return. - Review emergency department notes and lab results once available. - Consider contacting cardiologist Dr. Krasowski for further management. After ED note review.   follow up date to be determined   Hospital dc summary ecommendations for Outpatient Follow-up:  Follow outpatient CBC/CMP  Follow renin/aldo level outpatient  Needs compliance with CPAP - repeat outpatient sleep study if needed Continue to titrate meds as able, watch HR with clonidine  (bradycardic here) Needs renal follow up outpatient for proteinuria and hypertension and CKD  Alendronate  d/c'd due to renal function, follow outpatient   Discharge Diagnoses:  Principal Problem:   Hypertensive emergency Active Problems:   Hyperlipidemia associated with type 2 diabetes mellitus (HCC)   OSA (obstructive sleep apnea)   Chronic heart failure with preserved ejection fraction (HFpEF) (HCC)   Anemia due to chronic kidney  disease treated with erythropoietin    CKD (chronic kidney disease), stage IV (HCC)   Chronic leukopenia   History of present illness:  Olivia Morales is a 73 y.o. female with medical history significant for chronic HFpEF, CKD stage IV, uncontrolled HTN, HLD, anemia of chronic disease, chronic leukopenia, sickle cell trait, OSA not using CPAP who is admitted with hypertensive emergency.   She was  restarted on her home BP meds.  BP improved, but still elevated at time of discharge.  She's asymptomatic.  See below for additional details.    Hospital Course:  Assessment and Plan:   Hypertensive Emergency SBP to the 230's at presentation Elevated troponin, flat - due to demand, not c/w ACS Elevated pro BNP - not overloaded on exam, follow CXR (without active disease) Home BP meds resumed -> hydralazine  100 mg TID, clonidine  0.1 mg BID, amlodipine  10 mg daily, lasix  40 mg daily.  Holding metoprolol  due to bradycardia.  No room to titrate clonidine  due to bradycardia (will leave a current dose as she's asymptomatic) - BP remains elevated at discharge, follow outpatient BP's still uncontrolled today.  She notes missing BP meds occasionally at home (encouraged to use pill box). Workup for secondary causes of hypertension: TSH 12/2023 wnl.  Renin/aldo level pending.  Renal artery US  without evidence renal artery stenosis.  Echo 10/2023 with EF 60-65%, no RWMA, grade II diastolic dysfunction. Needs compliance with meds and CPAP   Headache Concern that HA is related to elevated BP above.  Tension like features.  No vision changes.  Head CT without acute intracranial abnormalities.   HA resolves with tylenol . Needs to use CPAP    Bradycardia Continue clonidine  at current dose, she's asymptomatic Holding metoprolol , monitor HR    HFpEF Continue jardiance Metoprolol  on hold as above   CKD IV  Proteinuria Creatinine appears close to baseline UP/C 2.7 Will monitor   Hypokalemia Replace and follow    Leukopenia Chronic, follow outpatient  Smear with unremarkable morphology on tech review   Anemia of chronic disease Gets aranesp  every 3 weeks   Hyperlipidemia Statin   OSA Not using CPAP - needs to use and/or update sleep study   Holding alendronate  at discharge due to renal function         Procedures: Renal artery US  Summary:  Renal:    Right: Normal size right kidney.  No evidence of right renal artery         stenosis.  Left:  Normal size of left kidney. No evidence of left renal artery         stenosis. Cyst(s) noted.    Did review pt aldosterone and renal activity studies with Dr. Krazowski as well reviewed case with him today before visit as he is pt cardiologist. Discussed options and recommended adding on imdur  30 mg daily to pt regimen as options liimited duet to her bradycardia and kidney function. Did not recommend adding on spironolactone, or increasing dose of clonidine . B blocker could not be added back on due to bradycarida.    Pt established with nephrologist for ckd. Encouraged to keep follow up. Pt sees hematologist- for anemia related to ckd. Pt has been referred to gastroenterologist for below in   I have been trying to get pt schedule since feb. Pt has been losing weight. Prior referral for increased lipase despite stopping her trulicity . Evaluate and treat. Refer to [facility] on [address]. This is her prior gi MD. March ct scan showed Fold thickening  along the stomach with slight stranding. Please correlate for any symptoms of gastritis. This does abut the area of the pancreas but there is preserved pancreatic parenchyma at this time. No well-defined fluid collections. Please correlate with specific clinical presentation. An IV contrast study may be of some benefit to further define these structures and abnormality.  Note pt ca 19-9 level is normal. CEA was also normal.   Olivia Morales is a 73 year old female with hypertension and heart failure who presents for blood pressure management and follow-up after a recent hospital stay.  She has been experiencing fluctuating blood pressure readings, with recent measurements ranging from 137/80 to 200/90. Her blood pressure readings at home have ranged from 137/80 to 200/90, with a recent reading over 200/90 two days ago.(Majority of time bp around 150/90) She has been more  consistent with her medication regimen since her recent hospitalization(admitted various times today that since feb was very sporadic and noncompliant with medication use). She is currently taking hydroxyzine 100 mg three times a day, clonidine  0.1 mg twice a day, amlodipine  10 mg daily, and Lasix  40 mg daily.   Again she reports previous non-compliance with her medications but is now adhering to her regimen.  She describes a lesion on her right buttock that appeared about a week ago. It is hyperpigmented, dry, and thick. No pain or recent trauma to the area.  She reports frequent urination, which she attributes to being back on Lasix .  She has a history of sleep apnea and is seeking a referral for a sleep study. She previously used CPAP but discontinued it. She is also coordinating physical therapy appointments and has been referred to a pulmonologist for CPAP evaluation.  She has a history of elevated lipase and weight loss, and there is a referral in place for a GI evaluation to assess these issues.(Have discussed with her this referral but has not been scheduled yet.) Pt had not called despite being advised in past. I had asked pt to call them as appointment was delayed despite my placing the referral.  She has not yet scheduled follow up  appointment with the nephrologist for follow-up on her kidney function.   I was about to enter pt room at 1:50 for her 1:40 appt. She told me she had appt with cardiologist at 2:20 and for this reason she was going to reschedule with me. Cardiologist is upstairs. Turns out pt was mistaken and that appt was cancelled months ago. I told pt had talked with her cardiologist at lunch and reviewed her hospitalization and bp med management.   We got pt appt with cardiologist next month and gave her print sheet with appt information.   Pt in process of getting my chart access. Explained to her important to use to keep up with her appt. Asked her to give access to  her family.   Pt admits recenltly that she had been using ibuprofen  recently when had ha associated with high blood pressure. She had been made aware not to use nsaids to due ckd. She indicates she used out of desparation due to ha. Again reviewed with her not to use any nsaids. Ok'd use of tylenol .   Review of Systems  Constitutional:  Negative for chills, fatigue and fever.  HENT:  Negative for congestion and ear pain.   Respiratory:  Negative for cough, chest tightness and wheezing.   Cardiovascular:  Negative for chest pain and palpitations.  Gastrointestinal:  Negative for abdominal pain and blood  in stool.  Genitourinary:  Positive for frequency. Negative for difficulty urinating and dysuria.  Musculoskeletal:  Negative for back pain and myalgias.  Neurological:  Negative for dizziness, weakness and headaches.  Hematological:  Negative for adenopathy.  Psychiatric/Behavioral:  Negative for behavioral problems and sleep disturbance.        Admits stress related to her health.       Objective:   Physical Exam  General Mental Status- Alert. General Appearance- Not in acute distress.   Skin General: Color- Normal Color. Moisture- Normal Moisture.  Neck Carotid Arteries- Normal color. Moisture- Normal Moisture. No carotid bruits. No JVD.  Chest and Lung Exam Auscultation: Breath Sounds:-CTA  Cardiovascular Auscultation:Rythm- RRR Murmurs & Other Heart Sounds:Auscultation of the heart reveals- No Murmurs.  Abdomen Inspection:-Inspeection Normal. Palpation/Percussion:Note:No mass. Palpation and Percussion of the abdomen reveal- Non Tender, Non Distended + BS, no rebound or guarding.   Neurologic Cranial Nerve exam:- CN III-XII intact(No nystagmus), symmetric smile. Strength:- 5/5 equal and symmetric strength both upper and lower extremities.    Lower ext- no pedal edema, negative homans signs. Calf symmetric.     Assessment & Plan:   Patient Instructions   Hypertensive heart disease with heart failure Blood pressure fluctuates between 137/80 and 200/90(most of time 150/90 range recently). Elevated troponin levels due to hypertension. Imdur  30 mg recommended by cardiologist. Goal to reduce blood pressure to 140-150 mmHg. - Prescribe Imdur  30 mg extended release. - Check blood pressure daily and report readings on Monday or Tuesday. - Order metabolic panel and CBC. - Order heart failure protein study. - Order chest x-ray to assess for pulmonary edema.  Chronic kidney disease Renal ultrasound normal. Advised to again avoid ibuprofen  due to nephrotoxicity risk. - Avoid ibuprofen . - Follow up with nephrologist to confirm appointment.  Suspected obstructive sleep apnea Referral to pulmonologist for CPAP evaluation. Previous CPAP use was uncomfortable. Newer models may improve compliance and assist in blood pressure control. - Provide referral to pulmonologist for sleep apnea evaluation. - Include pulmonologist contact information in after-visit summary.  Unintentional weight loss and elevated pancreatic enzymes under evaluation Weight loss and elevated pancreatic enzymes under evaluation. CA 19-9 normal. Referral to GI specialist for further evaluation. - Provide referral to GI specialist for evaluation of elevated lipase and weight loss. - Include GI specialist contact information in after-visit summary.  Frequent urination Frequent urination possibly related to Lasix  use. - Collect urine sample on Monday for evaluation.(today if possible)  Right buttock hyperpigmented skin lesion Hyperpigmented, dry, thickened skin lesion on right buttock, possible friction-related hyperpigmentation. - Apply Palmer's moisturizing lotion twice daily. - Provide referral to dermatologist for further evaluation.   Follow up date to be determined after lab review.                 [facility] Outpatient Endoscopy - [facility] [address] [place], [place] [address] Contact Us   P: [phone] F: [phone]   Referring To Provider Information [facility] CARE [address] [place] [address] [place] [address] [phone]   Referral Start Date: 02/13/2024 Referral End Date: 02/12/2025     Time spent with patient today was 60+  minutes which consisted of chart review, discussing diagnosis, work up treatment and documentation.(Total time including precharting before visit and phone consult with Dr. Donold

## 2024-02-15 LAB — CBC WITH DIFFERENTIAL/PLATELET
Absolute Lymphocytes: 1122 {cells}/uL (ref 850–3900)
Absolute Monocytes: 480 {cells}/uL (ref 200–950)
Basophils Absolute: 40 {cells}/uL (ref 0–200)
Basophils Relative: 0.9 %
Eosinophils Absolute: 31 {cells}/uL (ref 15–500)
Eosinophils Relative: 0.7 %
HCT: 37.7 % (ref 35.0–45.0)
Hemoglobin: 11.8 g/dL (ref 11.7–15.5)
MCH: 26 pg — ABNORMAL LOW (ref 27.0–33.0)
MCHC: 31.3 g/dL — ABNORMAL LOW (ref 32.0–36.0)
MCV: 83.2 fL (ref 80.0–100.0)
MPV: 11.3 fL (ref 7.5–12.5)
Monocytes Relative: 10.9 %
Neutro Abs: 2728 {cells}/uL (ref 1500–7800)
Neutrophils Relative %: 62 %
Platelets: 295 Thousand/uL (ref 140–400)
RBC: 4.53 Million/uL (ref 3.80–5.10)
RDW: 14.5 % (ref 11.0–15.0)
Total Lymphocyte: 25.5 %
WBC: 4.4 Thousand/uL (ref 3.8–10.8)

## 2024-02-15 LAB — COMPLETE METABOLIC PANEL WITHOUT GFR
AG Ratio: 1.4 (calc) (ref 1.0–2.5)
ALT: 11 U/L (ref 6–29)
AST: 18 U/L (ref 10–35)
Albumin: 3.9 g/dL (ref 3.6–5.1)
Alkaline phosphatase (APISO): 69 U/L (ref 37–153)
BUN/Creatinine Ratio: 8 (calc) (ref 6–22)
BUN: 18 mg/dL (ref 7–25)
CO2: 22 mmol/L (ref 20–32)
Calcium: 9.5 mg/dL (ref 8.6–10.4)
Chloride: 104 mmol/L (ref 98–110)
Creat: 2.22 mg/dL — ABNORMAL HIGH (ref 0.60–1.00)
Globulin: 2.7 g/dL (ref 1.9–3.7)
Glucose, Bld: 132 mg/dL — ABNORMAL HIGH (ref 65–99)
Potassium: 3.8 mmol/L (ref 3.5–5.3)
Sodium: 138 mmol/L (ref 135–146)
Total Bilirubin: 0.5 mg/dL (ref 0.2–1.2)
Total Protein: 6.6 g/dL (ref 6.1–8.1)

## 2024-02-15 LAB — BRAIN NATRIURETIC PEPTIDE: Brain Natriuretic Peptide: 131 pg/mL — ABNORMAL HIGH (ref <100)

## 2024-02-27 ENCOUNTER — Inpatient Hospital Stay

## 2024-02-27 ENCOUNTER — Inpatient Hospital Stay: Attending: Medical Oncology

## 2024-02-27 ENCOUNTER — Encounter: Payer: Self-pay | Admitting: Medical Oncology

## 2024-02-27 ENCOUNTER — Inpatient Hospital Stay: Admitting: Medical Oncology

## 2024-02-27 VITALS — BP 122/73 | HR 66 | Temp 98.2°F | Resp 20 | Ht 64.0 in | Wt 137.4 lb

## 2024-02-27 DIAGNOSIS — N189 Chronic kidney disease, unspecified: Secondary | ICD-10-CM

## 2024-02-27 DIAGNOSIS — N184 Chronic kidney disease, stage 4 (severe): Secondary | ICD-10-CM | POA: Insufficient documentation

## 2024-02-27 DIAGNOSIS — D631 Anemia in chronic kidney disease: Secondary | ICD-10-CM | POA: Insufficient documentation

## 2024-02-27 DIAGNOSIS — I159 Secondary hypertension, unspecified: Secondary | ICD-10-CM

## 2024-02-27 DIAGNOSIS — D72829 Elevated white blood cell count, unspecified: Secondary | ICD-10-CM | POA: Diagnosis not present

## 2024-02-27 DIAGNOSIS — R700 Elevated erythrocyte sedimentation rate: Secondary | ICD-10-CM

## 2024-02-27 DIAGNOSIS — I509 Heart failure, unspecified: Secondary | ICD-10-CM | POA: Diagnosis not present

## 2024-02-27 DIAGNOSIS — I130 Hypertensive heart and chronic kidney disease with heart failure and stage 1 through stage 4 chronic kidney disease, or unspecified chronic kidney disease: Secondary | ICD-10-CM | POA: Insufficient documentation

## 2024-02-27 DIAGNOSIS — G4730 Sleep apnea, unspecified: Secondary | ICD-10-CM | POA: Diagnosis not present

## 2024-02-27 DIAGNOSIS — Z79899 Other long term (current) drug therapy: Secondary | ICD-10-CM | POA: Diagnosis not present

## 2024-02-27 DIAGNOSIS — E1122 Type 2 diabetes mellitus with diabetic chronic kidney disease: Secondary | ICD-10-CM | POA: Insufficient documentation

## 2024-02-27 DIAGNOSIS — Z7982 Long term (current) use of aspirin: Secondary | ICD-10-CM | POA: Insufficient documentation

## 2024-02-27 DIAGNOSIS — R634 Abnormal weight loss: Secondary | ICD-10-CM | POA: Diagnosis not present

## 2024-02-27 LAB — RETIC PANEL
Immature Retic Fract: 3 % (ref 2.3–15.9)
RBC.: 3.67 MIL/uL — ABNORMAL LOW (ref 3.87–5.11)
Retic Count, Absolute: 18.4 K/uL — ABNORMAL LOW (ref 19.0–186.0)
Retic Ct Pct: 0.5 % (ref 0.4–3.1)
Reticulocyte Hemoglobin: 30.8 pg (ref >27.9)

## 2024-02-27 LAB — CBC WITH DIFFERENTIAL (CANCER CENTER ONLY)
Abs Immature Granulocytes: 0.02 K/uL (ref 0.00–0.07)
Basophils Absolute: 0 K/uL (ref 0.0–0.1)
Basophils Relative: 1 %
Eosinophils Absolute: 0.1 K/uL (ref 0.0–0.5)
Eosinophils Relative: 3 %
HCT: 29.4 % — ABNORMAL LOW (ref 36.0–46.0)
Hemoglobin: 9.5 g/dL — ABNORMAL LOW (ref 12.0–15.0)
Immature Granulocytes: 1 %
Lymphocytes Relative: 32 %
Lymphs Abs: 1 K/uL (ref 0.7–4.0)
MCH: 26 pg (ref 26.0–34.0)
MCHC: 32.3 g/dL (ref 30.0–36.0)
MCV: 80.3 fL (ref 80.0–100.0)
Monocytes Absolute: 0.4 K/uL (ref 0.1–1.0)
Monocytes Relative: 11 %
Neutro Abs: 1.6 K/uL — ABNORMAL LOW (ref 1.7–7.7)
Neutrophils Relative %: 52 %
Platelet Count: 255 K/uL (ref 150–400)
RBC: 3.66 MIL/uL — ABNORMAL LOW (ref 3.87–5.11)
RDW: 15.7 % — ABNORMAL HIGH (ref 11.5–15.5)
WBC Count: 3.1 K/uL — ABNORMAL LOW (ref 4.0–10.5)
nRBC: 0 % (ref 0.0–0.2)

## 2024-02-27 LAB — CMP (CANCER CENTER ONLY)
ALT: 10 U/L (ref 0–44)
AST: 19 U/L (ref 15–41)
Albumin: 3.9 g/dL (ref 3.5–5.0)
Alkaline Phosphatase: 71 U/L (ref 38–126)
Anion gap: 13 (ref 5–15)
BUN: 29 mg/dL — ABNORMAL HIGH (ref 8–23)
CO2: 22 mmol/L (ref 22–32)
Calcium: 9.6 mg/dL (ref 8.9–10.3)
Chloride: 108 mmol/L (ref 98–111)
Creatinine: 2.48 mg/dL — ABNORMAL HIGH (ref 0.44–1.00)
GFR, Estimated: 20 mL/min — ABNORMAL LOW (ref >60)
Glucose, Bld: 143 mg/dL — ABNORMAL HIGH (ref 70–99)
Potassium: 4.5 mmol/L (ref 3.5–5.1)
Sodium: 142 mmol/L (ref 135–145)
Total Bilirubin: 0.3 mg/dL (ref 0.0–1.2)
Total Protein: 6.6 g/dL (ref 6.5–8.1)

## 2024-02-27 MED ORDER — DARBEPOETIN ALFA 300 MCG/0.6ML IJ SOSY
300.0000 ug | PREFILLED_SYRINGE | Freq: Once | INTRAMUSCULAR | Status: AC
  Administered 2024-02-27: 300 ug via SUBCUTANEOUS
  Filled 2024-02-27: qty 0.6

## 2024-02-27 NOTE — Patient Instructions (Signed)

## 2024-02-27 NOTE — Progress Notes (Signed)
 Hematology and Oncology Follow Up Visit  Olivia Morales [id number] 03-19-1951 73 y.o. 02/27/2024  Past Medical History:  Diagnosis Date   CHF (congestive heart failure) (HCC)    Diabetes mellitus    Hypercholesteremia    Hypertension    Sickle cell trait (HCC)    Sleep apnea     Principle Diagnosis:  Anemia in the setting of CKD  Current Therapy:   Aranesp  300 mg Q 3 weeks for Hgb <11     Interim History:  [name] is back for follow-up for anemia.   Today she states that she has been doing really well. She has gotten her blood pressure under control and is so happy about this.   She continues to be seen by multiple specialists including nephrology, cardiology and her PCP. She sees [facility].   So far in her work up she has had no signs of iron, B12, deficiency. 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 She has had a scantly elevated sed rate of 23. CRP 1.2. EPO level was 9.6. retic count was low at 0.9. Unfortunately she has significant compliance issues with her BP medications.   No chest pain, SOB, new peripheral edema, visual changes or current headaches.   Fatigue has improved with aranesp .   There has been no bleeding to her knowledge: denies epistaxis, gingivitis, hemoptysis, hematemesis, hematuria, melena, excessive bruising, blood donation.   She continues to lose weight unintentionally. She is very concerned about  this. She reports eating really well and drinking ensure/protein drinks.   Wt Readings from Last 3 Encounters:  02/27/24 137 lb 6.4 oz (62.3 kg)  02/14/24 128 lb (58.1 kg)  02/06/24 137 lb 3.2 oz (62.2 kg)     Medications:   Current Outpatient Medications:    amLODipine  (NORVASC ) 10 MG tablet, Take 1 tablet (10 mg total) by mouth daily., Disp: , Rfl:    aspirin  EC 81 MG tablet, Take 81 mg by mouth daily. Swallow whole., Disp: , Rfl:    atorvastatin  (LIPITOR) 40 MG tablet, Take 1 tablet (40 mg total) by mouth daily., Disp: 90  tablet, Rfl: 0   cloNIDine  (CATAPRES ) 0.1 MG tablet, Take 1 tablet (0.1 mg total) by mouth 2 (two) times daily., Disp: , Rfl:    Cyanocobalamin  (VITAMIN B-12 PO), Take 1 tablet by mouth once a week., Disp: , Rfl:    diazepam  (VALIUM ) 5 MG tablet, Take 1 tablet (5 mg total) by mouth 2 (two) times daily., Disp: 10 tablet, Rfl: 0   empagliflozin (JARDIANCE) 10 MG TABS tablet, Take 10 mg by mouth daily., Disp: , Rfl:    ferrous sulfate 325 (65 FE) MG tablet, Take 325 mg by mouth daily with breakfast., Disp: , Rfl:    furosemide  (LASIX ) 40 MG tablet, Take 1 tablet (40 mg total) by mouth daily., Disp: 30 tablet, Rfl: 1   hydrALAZINE  (APRESOLINE ) 100 MG tablet, Take 1 tablet (100 mg total) by mouth 3 (three) times daily., Disp: 90 tablet, Rfl: 1   isosorbide  mononitrate (IMDUR ) 30 MG 24 hr tablet, Take 1 tablet (30 mg total) by mouth daily., Disp: 30 tablet, Rfl: 0   Lancets (ONETOUCH DELICA PLUS LANCET33G) MISC, USE TO TEST 1-2 TIMES DAILY, Disp: 100 each, Rfl: 0   lidocaine  (LIDODERM ) 5 %, Place 1 patch onto the skin daily. Remove & Discard patch within 12 hours or as directed by MD, Disp: 30 patch, Rfl: 0   omeprazole  (PRILOSEC) 20 MG capsule, Take 20 mg by mouth  daily., Disp: , Rfl:    ONETOUCH VERIO test strip, Check blood sugar 1-2 times daily, Disp: 100 each, Rfl: 12   potassium chloride  (MICRO-K ) 10 MEQ CR capsule, Take 10 mEq by mouth 2 (two) times daily., Disp: , Rfl:  No current facility-administered medications for this visit.  Facility-Administered Medications Ordered in Other Visits:    Darbepoetin Alfa  (ARANESP ) injection 300 mcg, 300 mcg, Subcutaneous, Once, Kaniesha Barile M, [place]  Allergies:  Allergies  Allergen Reactions   Ace Inhibitors Cough    Past Medical History, Surgical history, Social history, and Family History were reviewed and updated.  Review of Systems: Review of Systems  Constitutional:  Negative for appetite change, chills, diaphoresis, fatigue, fever and  unexpected weight change.  HENT:   Negative for hearing loss, mouth sores, nosebleeds and trouble swallowing.   Respiratory:  Negative for chest tightness, cough, hemoptysis, shortness of breath and wheezing.   Cardiovascular:  Negative for chest pain, leg swelling and palpitations.  Gastrointestinal:  Negative for abdominal pain, blood in stool and constipation.  Endocrine: Negative for hot flashes.  Genitourinary:  Negative for hematuria.   Skin:  Negative for rash.  Neurological:  Negative for headaches.  Hematological:  Negative for adenopathy. Does not bruise/bleed easily.  Psychiatric/Behavioral:  The patient is not nervous/anxious.      Physical Exam:  height is 5' 4 (1.626 m) and weight is 137 lb 6.4 oz (62.3 kg). Her oral temperature is 98.2 F (36.8 C). Her blood pressure is 122/73 and her pulse is 66. Her respiration is 20 and oxygen saturation is 100%.   Physical Exam General: NAD.  Cardiovascular: regular rate and rhythm  Pulmonary: clear ant fields Abdomen: soft, nontender, + bowel sounds GU: no suprapubic tenderness Extremities: no edema, no joint deformities Skin: no rashes Neurological: Weakness but otherwise nonfocal   Lab Results  Component Value Date   WBC 3.1 (L) 02/27/2024   HGB 9.5 (L) 02/27/2024   HCT 29.4 (L) 02/27/2024   MCV 80.3 02/27/2024   PLT 255 02/27/2024     Chemistry      Component Value Date/Time   NA 142 02/27/2024 1353   NA 141 11/06/2023 1517   K 4.5 02/27/2024 1353   CL 108 02/27/2024 1353   CO2 22 02/27/2024 1353   BUN 29 (H) 02/27/2024 1353   BUN 19 11/06/2023 1517   CREATININE 2.48 (H) 02/27/2024 1353   CREATININE 2.22 (H) 02/14/2024 1450      Component Value Date/Time   CALCIUM  9.6 02/27/2024 1353   ALKPHOS 71 02/27/2024 1353   AST 19 02/27/2024 1353   ALT 10 02/27/2024 1353   BILITOT 0.3 02/27/2024 1353     Encounter Diagnoses  Name Primary?   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  Yes    Leukocytosis, unspecified type    Unintentional weight loss    Elevated sed rate     Assessment and Plan- Patient is a 73 y.o. female who was referred to us  for anemia in the setting of elevated ferritin level. She has known sickle cell trait.    CBC from 11/04/2023: WBC 2.5, Hgb 9.4, MCV 85.5, Monocytes relative, ANC 1.4, platelets 243. Creatinine 2.20, normal LFTs. She has a history of elevated ferritin levels likely secondary to inflammation with mildly elevated CRP and Sed rate. UPEP pending however thus far her MM work up has reflected her chronic health conditions with no evidence of a M-spike. She has a history of low EPO levels  with elevated creatinine.   At her last visit we obtained a CA 19.9 and CEA based off of a CT C/A/P from March. These were in normal range. She has gained weight since her last visit and denies abdominal pain today. Very reassuring.   Very happy with her blood pressure from today    CBC shows a Hgb of 9.5. WBC is 3.1 today with ANC of 1.6 Aranesp  today WBC still down a bit- we will follow   Disposition: Aranesp  today RTC 1 month labs(CBC w/, CMP,  retic), APP, Aranesp  Injection (afternoon apt)   Lauraine Dais PA-C 8/28/20252:30 PM

## 2024-03-16 NOTE — Progress Notes (Unsigned)
 Cardiology Office Note   Date:  03/18/2024  ID:  Olivia Morales, DOB [birth year], MRN [id number] PCP: Dorina Dallas RIGGERS  [facility] Providers Cardiologist:  Lamar Fitch, MD     History of Present Illness Olivia Morales is a 73 y.o. female with a past medical history of DM2, uncontrolled hypertension, dyslipidemia, sickel cell train, OSA, HFpEF, anemia due to CKD.  02/06/2024 renal artery US  no evidence of stenosis 10/14/2023 Lexiscan  normal, low risk 10/10/2023 echo EF 60-65%, moderate LVH, grade II DD, LA mod dilated, mild MR  She established care with Dr. Krasowski on 09/20/2023 for evaluation and management of her hypertension and pedal edema.  She apparently has had several ED visits/admissions since this time.  She follows closely with her PCP.  She follows with hematology for management of her anemia secondary to CKD and receives Aranesp  infusions.  She mention she has a nephrologist however was told she  no longer has kidney issues so she has not seen her nephrologist in some time but she previously was with [facility].  Also during a recent trip to [place]  to visit family, she ended up in the hospital with pedal edema, proBNP 2910, requiring IV Lasix .  Admitted 02/04/2024-02/06/2024 with hypertensive emergency, systolic 230 upon presentation to the emergency department, troponins were flat, proBNP was elevated, she was bradycardic so her metoprolol  was held at discharge.   She presents today for follow-up after her most recent hospitalization, she is understandably concerned and frustrated with the trajectory of her health recently.  She has been evaluated by her PCP and a referral was made for pulmonologist for sleep evaluation, had previously been diagnosed with this however was not able to be compliant secondary to discomfort.  Secondary causes for hypertension have been explored, renal duplex was normal, ARR was elevated. She denies chest pain, palpitations,  dyspnea, pnd, orthopnea, n, v, dizziness, syncope, edema, weight gain, or early satiety.   ROS: Review of Systems  Constitutional:  Positive for malaise/fatigue and weight loss.  Cardiovascular:  Positive for leg swelling.  Psychiatric/Behavioral:  The patient is nervous/anxious.   All other systems reviewed and are negative.    Studies Reviewed      Cardiac Studies & Procedures   ______________________________________________________________________________________________   STRESS TESTS  MYOCARDIAL PERFUSION IMAGING 10/14/2023  Interpretation Summary   The study is normal. The study is low risk.   No ST deviation was noted.   LV perfusion is normal. There is no evidence of ischemia. There is no evidence of infarction.   Left ventricular function is normal. Nuclear stress EF: 74%. The left ventricular ejection fraction is hyperdynamic (>65%). End diastolic cavity size is normal. End systolic cavity size is normal.   ECHOCARDIOGRAM  ECHOCARDIOGRAM COMPLETE 10/10/2023  Narrative ECHOCARDIOGRAM REPORT    Patient Name:   Olivia Morales Date of Exam: 10/10/2023 Medical Rec #:  [id number]             Height:       64.0 in Accession #:    [id number]            Weight:       153.0 lb Date of Birth:  Aug 26, 1950             BSA:          1.746 m Patient Age:    73 years              BP:           156/78  mmHg Patient Gender: F                     HR:           62 bpm. Exam Location:  [place] [facility]  Procedure: 2D Echo, Cardiac Doppler and Color Doppler (Both Spectral and Color Flow Doppler were utilized during procedure).  Indications:    Nonrheumatic aortic valve stenosis [I35.0 (ICD-10-CM)]  History:        Patient has prior history of Echocardiogram examinations, most recent 08/12/2009. CKD, Aortic Valve Disease, Signs/Symptoms:Dyspnea and Edema; Risk Factors:Hypertension, Sleep Apnea, Diabetes, Dyslipidemia and Non-Smoker.  Sonographer:    Alan Greenhouse RDMS, RVT,  RDCS Referring Phys: [phone] LAMAR PARAS KRASOWSKI  IMPRESSIONS   1. Left ventricular ejection fraction, by estimation, is 60 to 65%. The left ventricle has normal function. The left ventricle has no regional wall motion abnormalities. There is moderate left ventricular hypertrophy. Left ventricular diastolic parameters are consistent with Grade II diastolic dysfunction (pseudonormalization). 2. Right ventricular systolic function is normal. The right ventricular size is normal. 3. Left atrial size was moderately dilated. 4. The mitral valve is normal in structure. Mild mitral valve regurgitation. No evidence of mitral stenosis. 5. The aortic valve is normal in structure. Aortic valve regurgitation is not visualized. No aortic stenosis is present. 6. The inferior vena cava is normal in size with greater than 50% respiratory variability, suggesting right atrial pressure of 3 mmHg.  Comparison(s): Echocardiogram done 08/12/09 showed an EF of 60-65%.  FINDINGS Left Ventricle: Left ventricular ejection fraction, by estimation, is 60 to 65%. The left ventricle has normal function. The left ventricle has no regional wall motion abnormalities. The left ventricular internal cavity size was normal in size. There is moderate left ventricular hypertrophy. Left ventricular diastolic parameters are consistent with Grade II diastolic dysfunction (pseudonormalization).  Right Ventricle: The right ventricular size is normal. No increase in right ventricular wall thickness. Right ventricular systolic function is normal.  Left Atrium: Left atrial size was moderately dilated.  Right Atrium: Right atrial size was normal in size.  Pericardium: There is no evidence of pericardial effusion.  Mitral Valve: The mitral valve is normal in structure. Mild mitral valve regurgitation. No evidence of mitral valve stenosis.  Tricuspid Valve: The tricuspid valve is normal in structure. Tricuspid valve regurgitation is not  demonstrated. No evidence of tricuspid stenosis.  Aortic Valve: The aortic valve is normal in structure. Aortic valve regurgitation is not visualized. No aortic stenosis is present. Aortic valve mean gradient measures 7.0 mmHg. Aortic valve peak gradient measures 13.4 mmHg. Aortic valve area, by VTI measures 2.30 cm.  Pulmonic Valve: The pulmonic valve was normal in structure. Pulmonic valve regurgitation is not visualized. No evidence of pulmonic stenosis.  Aorta: The aortic root is normal in size and structure.  Venous: The inferior vena cava is normal in size with greater than 50% respiratory variability, suggesting right atrial pressure of 3 mmHg.  IAS/Shunts: No atrial level shunt detected by color flow Doppler.   LEFT VENTRICLE PLAX 2D LVIDd:         4.30 cm     Diastology LVIDs:         2.30 cm     LV e' medial:    5.44 cm/s LV PW:         1.50 cm     LV E/e' medial:  20.6 LV IVS:        1.60 cm     LV e'  lateral:   7.18 cm/s LVOT diam:     1.80 cm     LV E/e' lateral: 15.6 LV SV:         94 LV SV Index:   54 LVOT Area:     2.54 cm  LV Volumes (MOD) LV vol d, MOD A2C: 77.1 ml LV vol d, MOD A4C: 66.6 ml LV vol s, MOD A2C: 27.0 ml LV vol s, MOD A4C: 22.9 ml LV SV MOD A2C:     50.1 ml LV SV MOD A4C:     66.6 ml LV SV MOD BP:      48.8 ml  RIGHT VENTRICLE RV S prime:     9.46 cm/s TAPSE (M-mode): 2.2 cm  LEFT ATRIUM              Index        RIGHT ATRIUM           Index LA diam:        4.60 cm  2.63 cm/m   RA Area:     12.70 cm LA Vol (A2C):   106.0 ml 60.72 ml/m  RA Volume:   30.20 ml  17.30 ml/m LA Vol (A4C):   53.0 ml  30.36 ml/m LA Biplane Vol: 76.8 ml  43.99 ml/m AORTIC VALVE AV Area (Vmax):    2.10 cm AV Area (Vmean):   2.27 cm AV Area (VTI):     2.30 cm AV Vmax:           183.33 cm/s AV Vmean:          119.000 cm/s AV VTI:            0.408 m AV Peak Grad:      13.4 mmHg AV Mean Grad:      7.0 mmHg LVOT Vmax:         151.00 cm/s LVOT Vmean:         106.000 cm/s LVOT VTI:          0.369 m LVOT/AV VTI ratio: 0.90  AORTA Ao Root diam: 3.00 cm Ao Asc diam:  3.10 cm  MITRAL VALVE                TRICUSPID VALVE MV Area (PHT): 4.21 cm     TR Peak grad:   19.4 mmHg MV Decel Time: 180 msec     TR Vmax:        220.00 cm/s MR Peak grad: 137.8 mmHg MR Vmax:      587.00 cm/s   SHUNTS MV E velocity: 112.00 cm/s  Systemic VTI:  0.37 m MV A velocity: 107.00 cm/s  Systemic Diam: 1.80 cm MV E/A ratio:  1.05  Naheim Burgen Crape MD Electronically signed by Colby Catanese Crape MD Signature Date/Time: 10/10/2023/5:27:53 PM    Final          ______________________________________________________________________________________________      Risk Assessment/Calculations   HYPERTENSION CONTROL Vitals:   03/18/24 1516 03/18/24 1840  BP: (!) 150/80 (!) 148/88    The patient's blood pressure is elevated above target today.  In order to address the patient's elevated BP: A referral to the [facility]pertension [facility] will be placed.; Blood pressure will be monitored at home to determine if medication changes need to be made.          Physical Exam VS:  BP (!) 148/88   Pulse (!) 56   Ht 5' 4 (1.626 m)   Wt 130 lb (59 kg)  SpO2 98%   BMI 22.31 kg/m        Wt Readings from Last 3 Encounters:  03/18/24 130 lb (59 kg)  02/27/24 137 lb 6.4 oz (62.3 kg)  02/14/24 128 lb (58.1 kg)    GEN: Appears younger than stated age, well nourished, well developed in no acute distress NECK: No JVD; No carotid bruits CARDIAC: RRR, 2/6 murmur best heart LSB, rubs, gallops RESPIRATORY:  Clear to auscultation without rales, wheezing or rhonchi  ABDOMEN: Soft, non-tender, non-distended EXTREMITIES: Trace pedal edema; No deformity   ASSESSMENT AND PLAN Hypertension-her blood pressure is actually decent for her today, she has had several admissions for hypertensive crisis.  ARR is elevated.  Renal duplex was negative.  Previously diagnosed with OSA  however not able to tolerate oral device >> PCP made new referral and she will see them in a few weeks for repeat evaluation.  I am going to refer her to the [facility] for further evaluation.  Continue Norvasc  10 mg daily, clonidine  0.1 mg twice daily, hydralazine  100 mg 3 times daily.  Appears isosorbide  mononitrate was recently added as well for blood pressure control. Beta blocker discontinued d/t bradycardia.  Will have her keep a blood pressure log, reviewed in detail how and when to check her blood pressure, continue this log until she is seen by the [facility]pert[facility].  HFpEF-NYHA class I, euvolemic.  GDMT is prohibited secondary to bradycardia and renal dysfunction.  Continue Lasix  40 mg daily.  CKD stage IV- She mentions that she was previously followed by [facility] but then advised that she no longer had kidney issues. Not clear when she last saw them, but she will make a follow up appt.  OSA - diagnosed years ago but not able to comply with therapies. She will see [facility] in the next few weeks for repeat sleep study.   Anxiety - she endorses anxiety, high stress at home, encouraged her to speak with her PCP about this further, to see if there is possibly undiagnosed depression/anxiety.   DM2 - most recent A1C controlled at 5.4%, followed by PCP.         Dispo: BP log, refer to the Advanced HTN Clinic. Follow up with general cardiology as determined by HTN clinic.   Signed, Delon JAYSON Hoover, NP

## 2024-03-18 ENCOUNTER — Encounter: Payer: Self-pay | Admitting: Cardiology

## 2024-03-18 ENCOUNTER — Ambulatory Visit: Attending: Cardiology | Admitting: Cardiology

## 2024-03-18 ENCOUNTER — Telehealth: Payer: Self-pay | Admitting: Medical

## 2024-03-18 VITALS — BP 148/88 | HR 56 | Ht 64.0 in | Wt 130.0 lb

## 2024-03-18 DIAGNOSIS — E1165 Type 2 diabetes mellitus with hyperglycemia: Secondary | ICD-10-CM

## 2024-03-18 DIAGNOSIS — G4733 Obstructive sleep apnea (adult) (pediatric): Secondary | ICD-10-CM | POA: Diagnosis not present

## 2024-03-18 DIAGNOSIS — I5032 Chronic diastolic (congestive) heart failure: Secondary | ICD-10-CM | POA: Diagnosis not present

## 2024-03-18 DIAGNOSIS — N184 Chronic kidney disease, stage 4 (severe): Secondary | ICD-10-CM

## 2024-03-18 DIAGNOSIS — I159 Secondary hypertension, unspecified: Secondary | ICD-10-CM | POA: Diagnosis not present

## 2024-03-18 NOTE — Patient Instructions (Addendum)
 Please keep a BP log for 2 weeks and send by MyChart or mail.                           Name and DOB__________________________ [address] [place], [place] [address]  Blood Pressure Record Sheet To take your blood pressure, you will need a blood pressure machine. You can buy a blood pressure machine (blood pressure monitor) at your clinic, drug store, or online. When choosing one, consider: An automatic monitor that has an arm cuff. A cuff that wraps snugly around your upper arm. You should be able to fit only one finger between your arm and the cuff. A device that stores blood pressure reading results. Do not choose a monitor that measures your blood pressure from your wrist or finger. Follow your health care provider's instructions for how to take your blood pressure. To use this form: Get one reading in the morning (a.m.) 1-2 hours after you take any medicines. Get one reading in the evening (p.m.) before supper.   Blood pressure log Date: _______________________  a.m. _____________________(1st reading) HR___________            p.m. _____________________(2nd reading) HR__________  Date: _______________________  a.m. _____________________(1st reading) HR___________            p.m. _____________________(2nd reading) HR__________  Date: _______________________  a.m. _____________________(1st reading) HR___________            p.m. _____________________(2nd reading) HR__________  Date: _______________________  a.m. _____________________(1st reading) HR___________            p.m. _____________________(2nd reading) HR__________  Date: _______________________  a.m. _____________________(1st reading) HR___________            p.m. _____________________(2nd reading) HR__________  Date: _______________________  a.m. _____________________(1st reading) HR___________            p.m. _____________________(2nd reading) HR__________  Date: _______________________  a.m.  _____________________(1st reading) HR___________            p.m. _____________________(2nd reading) HR__________   This information is not intended to replace advice given to you by your health care provider. Make sure you discuss any questions you have with your health care provider. Document Revised: 10/07/2019 Document Reviewed: 10/07/2019 Elsevier Patient Education  2021 Elsevier Inc.   Medication Instructions:  Your physician recommends that you continue on your current medications as directed. Please refer to the Current Medication list given to you today.  *If you need a refill on your cardiac medications before your next appointment, please call your pharmacy*   Lab Work: None ordered If you have labs (blood work) drawn today and your tests are completely normal, you will receive your results only by: MyChart Message (if you have MyChart) OR A paper copy in the mail If you have any lab test that is abnormal or we need to change your treatment, we will call you to review the results.   Testing/Procedures: None ordered   Follow-Up: At [facility], you and your health needs are our priority.  As part of our continuing mission to provide you with exceptional heart care, we have created designated Provider Care Teams.  These Care Teams include your primary Cardiologist (physician) and Advanced Practice Providers (APPs -  Physician Assistants and Nurse Practitioners) who all work together to provide you with the care you need, when you need it.  We recommend signing up for the patient portal called MyChart.  Sign up information is provided on this  After Visit Summary.  MyChart is used to connect with patients for Virtual Visits (Telemedicine).  Patients are able to view lab/test results, encounter notes, upcoming appointments, etc.  Non-urgent messages can be sent to your provider as well.   To learn more about what you can do with MyChart, go to [email or web address].     Your next appointment:   As directed by Dr. Raford  The format for your next appointment:   In Person  Provider:   Lamar Fitch, MD  Other Instructions none  Important Information About Sugar

## 2024-03-18 NOTE — Telephone Encounter (Signed)
 Reviewed cardiologist notes today. Want patient to follow up with me in 2-3 weeks or sooner if needed. Cardiologist indicated she may have anxious or depressed. So want to see how she is doing. Also note in cardiology summary states she was under impression no kidney issues? That is not the case. She should follow up with nephrologist and [facility] stated close follow up but did not give her follow up appointment? Please have her call [facility] for instruction on when to follow up. Please have her schedule follow up with me but will need 40 minutes.

## 2024-03-19 ENCOUNTER — Other Ambulatory Visit: Payer: Self-pay | Admitting: Medical

## 2024-03-19 ENCOUNTER — Inpatient Hospital Stay: Attending: Medical Oncology

## 2024-03-19 ENCOUNTER — Other Ambulatory Visit (HOSPITAL_BASED_OUTPATIENT_CLINIC_OR_DEPARTMENT_OTHER): Payer: Self-pay

## 2024-03-19 ENCOUNTER — Inpatient Hospital Stay: Admitting: Medical Oncology

## 2024-03-19 ENCOUNTER — Inpatient Hospital Stay

## 2024-03-19 VITALS — BP 133/54 | HR 68 | Temp 98.1°F | Resp 20 | Ht 64.0 in | Wt 128.0 lb

## 2024-03-19 DIAGNOSIS — R634 Abnormal weight loss: Secondary | ICD-10-CM | POA: Diagnosis not present

## 2024-03-19 DIAGNOSIS — D72829 Elevated white blood cell count, unspecified: Secondary | ICD-10-CM | POA: Diagnosis not present

## 2024-03-19 DIAGNOSIS — D631 Anemia in chronic kidney disease: Secondary | ICD-10-CM

## 2024-03-19 DIAGNOSIS — R700 Elevated erythrocyte sedimentation rate: Secondary | ICD-10-CM

## 2024-03-19 DIAGNOSIS — R935 Abnormal findings on diagnostic imaging of other abdominal regions, including retroperitoneum: Secondary | ICD-10-CM

## 2024-03-19 DIAGNOSIS — N189 Chronic kidney disease, unspecified: Secondary | ICD-10-CM

## 2024-03-19 LAB — CBC WITH DIFFERENTIAL (CANCER CENTER ONLY)
Abs Immature Granulocytes: 0.02 K/uL (ref 0.00–0.07)
Basophils Absolute: 0 K/uL (ref 0.0–0.1)
Basophils Relative: 1 %
Eosinophils Absolute: 0 K/uL (ref 0.0–0.5)
Eosinophils Relative: 1 %
HCT: 35.3 % — ABNORMAL LOW (ref 36.0–46.0)
Hemoglobin: 11.5 g/dL — ABNORMAL LOW (ref 12.0–15.0)
Immature Granulocytes: 1 %
Lymphocytes Relative: 21 %
Lymphs Abs: 0.6 K/uL — ABNORMAL LOW (ref 0.7–4.0)
MCH: 26.9 pg (ref 26.0–34.0)
MCHC: 32.6 g/dL (ref 30.0–36.0)
MCV: 82.7 fL (ref 80.0–100.0)
Monocytes Absolute: 0.4 K/uL (ref 0.1–1.0)
Monocytes Relative: 14 %
Neutro Abs: 1.8 K/uL (ref 1.7–7.7)
Neutrophils Relative %: 62 %
Platelet Count: 277 K/uL (ref 150–400)
RBC: 4.27 MIL/uL (ref 3.87–5.11)
RDW: 17.9 % — ABNORMAL HIGH (ref 11.5–15.5)
WBC Count: 2.9 K/uL — ABNORMAL LOW (ref 4.0–10.5)
nRBC: 0 % (ref 0.0–0.2)

## 2024-03-19 LAB — RETIC PANEL
Immature Retic Fract: 7.1 % (ref 2.3–15.9)
RBC.: 4.27 MIL/uL (ref 3.87–5.11)
Retic Count, Absolute: 37.1 K/uL (ref 19.0–186.0)
Retic Ct Pct: 0.9 % (ref 0.4–3.1)
Reticulocyte Hemoglobin: 30.7 pg (ref >27.9)

## 2024-03-19 LAB — CMP (CANCER CENTER ONLY)
ALT: 5 U/L (ref 0–44)
AST: 16 U/L (ref 15–41)
Albumin: 4 g/dL (ref 3.5–5.0)
Alkaline Phosphatase: 63 U/L (ref 38–126)
Anion gap: 11 (ref 5–15)
BUN: 28 mg/dL — ABNORMAL HIGH (ref 8–23)
CO2: 24 mmol/L (ref 22–32)
Calcium: 10.1 mg/dL (ref 8.9–10.3)
Chloride: 109 mmol/L (ref 98–111)
Creatinine: 2.25 mg/dL — ABNORMAL HIGH (ref 0.44–1.00)
GFR, Estimated: 23 mL/min — ABNORMAL LOW (ref >60)
Glucose, Bld: 155 mg/dL — ABNORMAL HIGH (ref 70–99)
Potassium: 4.6 mmol/L (ref 3.5–5.1)
Sodium: 144 mmol/L (ref 135–145)
Total Bilirubin: 0.4 mg/dL (ref 0.0–1.2)
Total Protein: 6.7 g/dL (ref 6.5–8.1)

## 2024-03-19 MED ORDER — ISOSORBIDE MONONITRATE ER 30 MG PO TB24
30.0000 mg | ORAL_TABLET | Freq: Every day | ORAL | 0 refills | Status: DC
  Filled 2024-03-19: qty 30, 30d supply, fill #0

## 2024-03-19 NOTE — Progress Notes (Signed)
 Hematology and Oncology Follow Up Visit  Olivia Morales [id number] [birth year] 72 y.o. 03/19/2024  Past Medical History:  Diagnosis Date   CHF (congestive heart failure) (HCC)    Diabetes mellitus    Hypercholesteremia    Hypertension    Sickle cell trait (HCC)    Sleep apnea     Principle Diagnosis:  Anemia in the setting of CKD  Current Therapy:   Aranesp  300 mg Q 3 weeks for Hgb <11     Interim History:  Olivia Morales is back for follow-up for anemia.   Today she is doing well. She has noticed that her weight continues to fall. She is trying to increase her oral intake.   She continues to be seen by multiple specialists including nephrology, cardiology and her PCP. She sees [facility]. She saw cardiology yesterday.   So far in her work up she has had no signs of iron, B12, deficiency. 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 She has had a scantly elevated sed rate of 23. CRP 1.2. EPO level was 9.6. retic count was low at 0.9. Unfortunately she has significant compliance issues with her BP medications.  No chest pain, SOB, new peripheral edema, visual changes or current headaches.   Fatigue has improved with aranesp .   There has been no bleeding to her knowledge: denies epistaxis, gingivitis, hemoptysis, hematemesis, hematuria, melena, excessive bruising, blood donation. .   Wt Readings from Last 3 Encounters:  03/19/24 128 lb (58.1 kg)  03/18/24 130 lb (59 kg)  02/27/24 137 lb 6.4 oz (62.3 kg)     Medications:   Current Outpatient Medications:    amLODipine  (NORVASC ) 10 MG tablet, Take 1 tablet (10 mg total) by mouth daily., Disp: , Rfl:    aspirin  EC 81 MG tablet, Take 81 mg by mouth daily. Swallow whole., Disp: , Rfl:    atorvastatin  (LIPITOR) 40 MG tablet, Take 1 tablet (40 mg total) by mouth daily., Disp: 90 tablet, Rfl: 0   cloNIDine  (CATAPRES ) 0.1 MG tablet, Take 1 tablet (0.1 mg total) by mouth 2 (two) times daily., Disp: , Rfl:     Cyanocobalamin  (VITAMIN B-12 PO), Take 1 tablet by mouth once a week., Disp: , Rfl:    empagliflozin (JARDIANCE) 10 MG TABS tablet, Take 10 mg by mouth daily., Disp: , Rfl:    ferrous sulfate 325 (65 FE) MG tablet, Take 325 mg by mouth daily with breakfast., Disp: , Rfl:    furosemide  (LASIX ) 40 MG tablet, Take 1 tablet (40 mg total) by mouth daily., Disp: 30 tablet, Rfl: 1   hydrALAZINE  (APRESOLINE ) 100 MG tablet, Take 1 tablet (100 mg total) by mouth 3 (three) times daily., Disp: 90 tablet, Rfl: 1   isosorbide  mononitrate (IMDUR ) 30 MG 24 hr tablet, Take 1 tablet (30 mg total) by mouth daily., Disp: 30 tablet, Rfl: 0   Lancets (ONETOUCH DELICA PLUS LANCET33G) MISC, USE TO TEST 1-2 TIMES DAILY, Disp: 100 each, Rfl: 0   lidocaine  (LIDODERM ) 5 %, Place 1 patch onto the skin daily. Remove & Discard patch within 12 hours or as directed by MD, Disp: 30 patch, Rfl: 0   omeprazole  (PRILOSEC) 20 MG capsule, Take 20 mg by mouth daily., Disp: , Rfl:    ONETOUCH VERIO test strip, Check blood sugar 1-2 times daily, Disp: 100 each, Rfl: 12   potassium chloride  (MICRO-K ) 10 MEQ CR capsule, Take 10 mEq by mouth 2 (two) times daily., Disp: , Rfl:   Allergies:  Allergies  Allergen Reactions   Ace Inhibitors Cough    Past Medical History, Surgical history, Social history, and Family History were reviewed and updated.  Review of Systems: Review of Systems  Constitutional:  Negative for appetite change, chills, diaphoresis, fatigue, fever and unexpected weight change.  HENT:   Negative for hearing loss, mouth sores, nosebleeds and trouble swallowing.   Respiratory:  Negative for chest tightness, cough, hemoptysis, shortness of breath and wheezing.   Cardiovascular:  Negative for chest pain, leg swelling and palpitations.  Gastrointestinal:  Negative for abdominal pain, blood in stool and constipation.  Endocrine: Negative for hot flashes.  Genitourinary:  Negative for hematuria.   Skin:  Negative for rash.   Neurological:  Negative for headaches.  Hematological:  Negative for adenopathy. Does not bruise/bleed easily.  Psychiatric/Behavioral:  The patient is not nervous/anxious.      Physical Exam:  height is 5' 4 (1.626 m) and weight is 128 lb (58.1 kg). Her oral temperature is 98.1 F (36.7 C). Her blood pressure is 133/54 (abnormal) and her pulse is 68. Her respiration is 20 and oxygen saturation is 100%.   Physical Exam General: NAD.  Cardiovascular: regular rate and rhythm  Pulmonary: clear ant fields Abdomen: soft, nontender, + bowel sounds GU: no suprapubic tenderness Extremities: no edema, no joint deformities Skin: no rashes Neurological: Weakness but otherwise nonfocal   Lab Results  Component Value Date   WBC 2.9 (L) 03/19/2024   HGB 11.5 (L) 03/19/2024   HCT 35.3 (L) 03/19/2024   MCV 82.7 03/19/2024   PLT 277 03/19/2024     Chemistry      Component Value Date/Time   NA 142 02/27/2024 1353   NA 141 11/06/2023 1517   K 4.5 02/27/2024 1353   CL 108 02/27/2024 1353   CO2 22 02/27/2024 1353   BUN 29 (H) 02/27/2024 1353   BUN 19 11/06/2023 1517   CREATININE 2.48 (H) 02/27/2024 1353   CREATININE 2.22 (H) 02/14/2024 1450      Component Value Date/Time   CALCIUM  9.6 02/27/2024 1353   ALKPHOS 71 02/27/2024 1353   AST 19 02/27/2024 1353   ALT 10 02/27/2024 1353   BILITOT 0.3 02/27/2024 1353     No diagnosis found.   Assessment and Plan- Patient is a 73 y.o. female who was referred to us  for anemia in the setting of elevated ferritin level. She has known sickle cell trait.    CBC from 11/04/2023: WBC 2.5, Hgb 9.4, MCV 85.5, Monocytes relative, ANC 1.4, platelets 243. Creatinine 2.20, normal LFTs. She has a history of elevated ferritin levels likely secondary to inflammation with mildly elevated CRP and Sed rate. UPEP pending however thus far her MM work up has reflected her chronic health conditions with no evidence of a M-spike. She has a history of low EPO  levels with elevated creatinine.   At her recent visit we obtained a CA 19.9 and CEA based off of a CT C/A/P from March. These were in normal range.   CBC shows a Hgb of 11.5. WBC is 2.9 today with ANC of 1.8 No Aranesp  today Will continue to follow white count and wieght- Happy to see that rate of loss seems to have improved with increased eating. If weight continues to fall I would suggest an earlier repeat of her CT scan.    Disposition: No Aranesp  today- Hgb 11.5 RTC 1 month labs(CBC w/, CMP,  retic), APP, Aranesp  Injection (afternoon apt)   Lauraine Dais PA-C 9/18/20253:10 PM

## 2024-03-23 ENCOUNTER — Encounter: Payer: Self-pay | Admitting: Medical

## 2024-03-23 ENCOUNTER — Other Ambulatory Visit (HOSPITAL_BASED_OUTPATIENT_CLINIC_OR_DEPARTMENT_OTHER): Payer: Self-pay

## 2024-03-23 ENCOUNTER — Ambulatory Visit: Admitting: Medical

## 2024-03-23 VITALS — BP 150/80 | HR 74 | Temp 97.9°F | Resp 15 | Ht 64.0 in | Wt 131.6 lb

## 2024-03-23 DIAGNOSIS — D649 Anemia, unspecified: Secondary | ICD-10-CM

## 2024-03-23 DIAGNOSIS — F419 Anxiety disorder, unspecified: Secondary | ICD-10-CM

## 2024-03-23 DIAGNOSIS — G4730 Sleep apnea, unspecified: Secondary | ICD-10-CM | POA: Diagnosis not present

## 2024-03-23 DIAGNOSIS — I5032 Chronic diastolic (congestive) heart failure: Secondary | ICD-10-CM

## 2024-03-23 DIAGNOSIS — E1165 Type 2 diabetes mellitus with hyperglycemia: Secondary | ICD-10-CM | POA: Diagnosis not present

## 2024-03-23 DIAGNOSIS — F32A Depression, unspecified: Secondary | ICD-10-CM

## 2024-03-23 DIAGNOSIS — N189 Chronic kidney disease, unspecified: Secondary | ICD-10-CM

## 2024-03-23 DIAGNOSIS — I10 Essential (primary) hypertension: Secondary | ICD-10-CM | POA: Diagnosis not present

## 2024-03-23 MED ORDER — BUSPIRONE HCL 7.5 MG PO TABS
7.5000 mg | ORAL_TABLET | Freq: Two times a day (BID) | ORAL | 0 refills | Status: DC
  Filled 2024-03-23: qty 60, 30d supply, fill #0

## 2024-03-23 NOTE — Patient Instructions (Addendum)
 Hypertension(mild high today and resistant in past) Persistent hypertension despite multiple antihypertensive medications. Discontinued metoprolol  due to bradycardia. - Referral to advanced hypertension clinic and cardiologist. - Discontinue metoprolol . - Continue Norvasc  10 mg daily, clonidine  0.1 mg twice daily, hydralazine  100 mg three times daily, and isosorbide  mononitrate as prescribed.   Sleep Apnea Sleep apnea potentially impacting resistant hypertension. - Follow up with pulmonology for sleep apnea evaluation.  Congestive Heart Failure Managed with furosemide . No significant fluid accumulation. Oxygen saturation at 98%. - Continue furosemide  40 mg daily. - Use ACE wraps for lower extremity compression and elevate legs. - Consider purchasing a pulse oximeter for home monitoring. -if more pedal edema, dyspnea and dropping 02 sat let us  know and get cxr with bnp. Not indicated presently.  Chronic Kidney Disease Chronic kidney disease with decreased but stable kidney function. - Follow up with nephrologist in October.  Anemia Anemia under evaluation by hematology. Hemoglobin at 11.5. Aranesp  injection withheld. - Continue hematology follow-up.  Depression and Anxiety Chronic depression and anxiety with high PHQ-9 and GAD-7 scores. Severe reaction to sertraline. Avoid SSRIs. - Prescribe Buspar  7.5 mg twice daily. - Reassess mood and anxiety in one month or sooner if needed.  Type 2 Diabetes Mellitus, diet controlled Type 2 diabetes mellitus controlled with diet. Last A1c was 5.4. - Order A1c test today.  Follow up in one month or sooner if needed

## 2024-03-23 NOTE — Progress Notes (Signed)
 Subjective:    Patient ID: Olivia Morales, female    DOB: 1951/05/11, 73 y.o.   MRN: [id number]  HPI  Olivia Morales is a 73 year old female with chronic kidney disease, hypertension, chf, anemia,  sleep apnea, anemia, anxiety and depression.  She has experienced significant weight loss and decreased appetite. She previously underwent a CT of the abdomen and pelvis. An endoscopy is scheduled for October 22nd to further evaluate these symptoms. After various attempts to refer to GI MD pt now being seen.  She has a history of chronic kidney disease.  She is scheduled to follow up with a nephrologist.Next appointment in October.  She has resistant hypertension historically despite being on Norvasc  10 mg, clonidine  0.1 mg twice daily, hydralazine  100 mg three times daily, and isosorbide  mononitrate. Metoprolol  was recently discontinued from her medication list due to bradycardia. Home blood pressure readings are around 158/86.  She has a history of sleep apnea, noted during a previous hospitalization, and is scheduled to see a pulmonologist. She also has CHF and is on furosemide  40 mg daily for fluid management. She reports ankle swelling and uses ACE wraps for compression.  She has a history of anemia with a recent hemoglobin level of 11.5. She did not receive an Aranesp  injection during her last hematology visit.  She has a history of anxiety and depression since February, with high scores on the PHQ-9 and GAD-7 questionnaires. She recalls a severe reaction to Zoloft in the past, which caused hypotension. Indicates she wants to avoid sertraline class of med/similar meds.  No shortness of breath when lying flat but notes palpitations but reports ankle swelling and has a history of CHF. No home oxygen saturation monitor.      Review of Systems  Constitutional:  Negative for chills, fatigue and fever.  Respiratory:  Negative for choking, shortness of breath and wheezing.    Cardiovascular:  Negative for chest pain and palpitations.  Gastrointestinal:  Negative for abdominal pain, nausea and vomiting.  Musculoskeletal:  Negative for back pain, myalgias and neck stiffness.  Skin:  Negative for rash.  Neurological:  Negative for dizziness, speech difficulty and weakness.  Psychiatric/Behavioral:  Negative for behavioral problems, decreased concentration, self-injury and suicidal ideas. The patient is nervous/anxious. The patient is not hyperactive.     Past Medical History:  Diagnosis Date   CHF (congestive heart failure) (HCC)    Diabetes mellitus    Hypercholesteremia    Hypertension    Sickle cell trait    Sleep apnea      Social History   Socioeconomic History   Marital status: Widowed    Spouse name: Not on file   Number of children: Not on file   Years of education: Not on file   Highest education level: Not on file  Occupational History   Not on file  Tobacco Use   Smoking status: Never    Passive exposure: Current   Smokeless tobacco: Never  Vaping Use   Vaping status: Never Used  Substance and Sexual Activity   Alcohol use: Yes    Comment: drinks beer on occassion   Drug use: Not Currently    Types: Crack cocaine    Comment: history of smoking crack ...it's been a 1.5 years   Sexual activity: Not on file  Other Topics Concern   Not on file  Social History Narrative   Not on file   Social Drivers of Health   Financial Resource Strain: Low Risk  (  12/26/2023)   Overall Financial Resource Strain (CARDIA)    Difficulty of Paying Living Expenses: Not hard at all  Food Insecurity: No Food Insecurity (02/04/2024)   Hunger Vital Sign    Worried About Running Out of Food in the Last Year: Never true    Ran Out of Food in the Last Year: Never true  Transportation Needs: No Transportation Needs (02/04/2024)   PRAPARE - Administrator, Civil Service (Medical): No    Lack of Transportation (Non-Medical): No  Physical  Activity: Inactive (12/26/2023)   Exercise Vital Sign    Days of Exercise per Week: 0 days    Minutes of Exercise per Session: 0 min  Stress: No Stress Concern Present (12/26/2023)   [employer] of Occupational Health - Occupational Stress Questionnaire    Feeling of Stress: Not at all  Social Connections: Unknown (02/05/2024)   Social Connection and Isolation Panel    Frequency of Communication with Friends and Family: More than three times a week    Frequency of Social Gatherings with Friends and Family: More than three times a week    Attends Religious Services: Not on Marketing executive or Organizations: Not on file    Attends Banker Meetings: Not on file    Marital Status: Not on file  Recent Concern: Social Connections - Moderately Isolated (02/04/2024)   Social Connection and Isolation Panel    Frequency of Communication with Friends and Family: More than three times a week    Frequency of Social Gatherings with Friends and Family: Not on file    Attends Religious Services: More than 4 times per year    Active Member of [employer] or Organizations: No    Attends Banker Meetings: Never    Marital Status: Widowed  Intimate Partner Violence: Not At Risk (02/04/2024)   Humiliation, Afraid, Rape, and Kick questionnaire    Fear of Current or Ex-Partner: No    Emotionally Abused: No    Physically Abused: No    Sexually Abused: No    Past Surgical History:  Procedure Laterality Date   TUBAL LIGATION      Family History  Problem Relation Age of Onset   Sickle cell anemia Father     Allergies  Allergen Reactions   Ace Inhibitors Cough    Current Outpatient Medications on File Prior to Visit  Medication Sig Dispense Refill   amLODipine  (NORVASC ) 10 MG tablet Take 1 tablet (10 mg total) by mouth daily.     aspirin  EC 81 MG tablet Take 81 mg by mouth daily. Swallow whole.     atorvastatin  (LIPITOR) 40 MG tablet Take 1 tablet (40 mg total)  by mouth daily. 90 tablet 0   cloNIDine  (CATAPRES ) 0.1 MG tablet Take 1 tablet (0.1 mg total) by mouth 2 (two) times daily.     Cyanocobalamin  (VITAMIN B-12 PO) Take 1 tablet by mouth once a week.     empagliflozin (JARDIANCE) 10 MG TABS tablet Take 10 mg by mouth daily.     ferrous sulfate 325 (65 FE) MG tablet Take 325 mg by mouth daily with breakfast.     furosemide  (LASIX ) 40 MG tablet Take 1 tablet (40 mg total) by mouth daily. 30 tablet 1   hydrALAZINE  (APRESOLINE ) 100 MG tablet Take 1 tablet (100 mg total) by mouth 3 (three) times daily. 90 tablet 1   isosorbide  mononitrate (IMDUR ) 30 MG 24 hr tablet Take 1 tablet (  30 mg total) by mouth daily. 30 tablet 0   Lancets (ONETOUCH DELICA PLUS LANCET33G) MISC USE TO TEST 1-2 TIMES DAILY 100 each 0   lidocaine  (LIDODERM ) 5 % Place 1 patch onto the skin daily. Remove & Discard patch within 12 hours or as directed by MD 30 patch 0   omeprazole  (PRILOSEC) 20 MG capsule Take 20 mg by mouth daily.     ONETOUCH VERIO test strip Check blood sugar 1-2 times daily 100 each 12   potassium chloride  (MICRO-K ) 10 MEQ CR capsule Take 10 mEq by mouth 2 (two) times daily.     No current facility-administered medications on file prior to visit.    BP (!) 150/80   Pulse 74   Temp 97.9 F (36.6 C) (Oral)   Resp 15   Ht 5' 4 (1.626 m)   Wt 131 lb 9.6 oz (59.7 kg)   SpO2 97%   BMI 22.59 kg/m        Objective:   Physical Exam  General Mental Status- Alert. General Appearance- Not in acute distress.   Skin General: Color- Normal Color. Moisture- Normal Moisture.  Neck Carotid Arteries- Normal color. Moisture- Normal Moisture. No carotid bruits. No JVD.  Chest and Lung Exam Auscultation: Breath Sounds:-CTA  Cardiovascular Auscultation:Rythm- RRR Murmurs & Other Heart Sounds:Auscultation of the heart reveals- No Murmurs.  Abdomen Inspection:-Inspeection Normal. Palpation/Percussion:Note:No mass. Palpation and Percussion of the abdomen  reveal- Non Tender, Non Distended + BS, no rebound or guarding.   Neurologic Cranial Nerve exam:- CN III-XII intact(No nystagmus), symmetric smile. Strength:- 5/5 equal and symmetric strength both upper and lower extremities.   Lower ext- no pedal edema, negative homans signs. Calfs symmetric. 1+ ankle edema.    Assessment & Plan:   Patient Instructions  Hypertension(mild high today and resistant in past) Persistent hypertension despite multiple antihypertensive medications. Discontinued metoprolol  due to bradycardia. - Referral to advanced hypertension clinic and cardiologist. - Discontinue metoprolol . - Continue Norvasc  10 mg daily, clonidine  0.1 mg twice daily, hydralazine  100 mg three times daily, and isosorbide  mononitrate as prescribed.   Sleep Apnea Sleep apnea potentially impacting resistant hypertension. - Follow up with pulmonology for sleep apnea evaluation.  Congestive Heart Failure Managed with furosemide . No significant fluid accumulation. Oxygen saturation at 98%. - Continue furosemide  40 mg daily. - Use ACE wraps for lower extremity compression and elevate legs. - Consider purchasing a pulse oximeter for home monitoring. -if more pedal edema, dyspnea and dropping 02 sat let us  know and get cxr with bnp. Not indicated presently.  Chronic Kidney Disease Chronic kidney disease with decreased but stable kidney function. - Follow up with nephrologist in October.  Anemia Anemia under evaluation by hematology. Hemoglobin at 11.5. Aranesp  injection withheld. - Continue hematology follow-up.  Depression and Anxiety Chronic depression and anxiety with high PHQ-9 and GAD-7 scores. Severe reaction to sertraline. Avoid SSRIs. - Prescribe Buspar  7.5 mg twice daily. - Reassess mood and anxiety in one month or sooner if needed.  Type 2 Diabetes Mellitus, diet controlled Type 2 diabetes mellitus controlled with diet. Last A1c was 5.4. - Order A1c test today.  Follow up in  one month or sooner if needed   [employer], PA-C   I personally spent a total of 43 minutes in the care of the patient today including performing a medically appropriate exam/evaluation, counseling and educating, placing orders, referring and communicating with other health care professionals, and documenting clinical information in the EHR.

## 2024-03-24 ENCOUNTER — Ambulatory Visit: Payer: Self-pay | Admitting: Medical

## 2024-03-24 ENCOUNTER — Ambulatory Visit: Payer: Self-pay | Admitting: Medical Oncology

## 2024-03-24 LAB — HEMOGLOBIN A1C: Hgb A1c MFr Bld: 5.5 % (ref 4.6–6.5)

## 2024-03-31 ENCOUNTER — Other Ambulatory Visit (HOSPITAL_COMMUNITY): Payer: Self-pay

## 2024-03-31 ENCOUNTER — Other Ambulatory Visit (HOSPITAL_BASED_OUTPATIENT_CLINIC_OR_DEPARTMENT_OTHER): Payer: Self-pay

## 2024-04-07 ENCOUNTER — Other Ambulatory Visit: Payer: Self-pay

## 2024-04-09 ENCOUNTER — Ambulatory Visit: Admitting: Primary Care

## 2024-04-09 DIAGNOSIS — G4710 Hypersomnia, unspecified: Secondary | ICD-10-CM

## 2024-04-09 DIAGNOSIS — G4733 Obstructive sleep apnea (adult) (pediatric): Secondary | ICD-10-CM

## 2024-04-14 ENCOUNTER — Ambulatory Visit (INDEPENDENT_AMBULATORY_CARE_PROVIDER_SITE_OTHER): Admitting: Primary Care

## 2024-04-14 ENCOUNTER — Encounter: Payer: Self-pay | Admitting: Primary Care

## 2024-04-14 VITALS — BP 150/65 | HR 63 | Temp 98.2°F | Ht 64.0 in | Wt 127.8 lb

## 2024-04-14 DIAGNOSIS — G4733 Obstructive sleep apnea (adult) (pediatric): Secondary | ICD-10-CM | POA: Diagnosis not present

## 2024-04-14 NOTE — Patient Instructions (Addendum)
  VISIT SUMMARY: You came in today for a sleep consultation due to your history of sleep apnea and recent symptoms. You have experienced significant weight loss and daytime sleepiness, and your last sleep study was in 2017. You also have chronic diastolic heart failure and kidney disease, and your blood pressure is now managed with medication.  YOUR PLAN: -OBSTRUCTIVE SLEEP APNEA WITH PERSISTENT SYMPTOMS: Obstructive sleep apnea is a condition where your airway becomes blocked during sleep, causing breathing interruptions. We will schedule a split night sleep study at [facility] to assess the current severity of your sleep apnea and determine if CPAP therapy is needed. If CPAP is recommended, we will discuss options to find a more comfortable mask and pressure setting. Untreated sleep apnea can lead to serious health issues like heart problems, stroke, and diabetes.  -UNINTENTIONAL WEIGHT LOSS: You have lost 40 pounds since February 2025 without trying, which is associated with fatigue and decreased appetite. This weight loss may affect your sleep apnea symptoms. We will ensure your primary care provider is aware of this weight loss and the associated symptoms.  INSTRUCTIONS: Please follow up with your primary care provider regarding your unintentional weight loss and associated symptoms. We will schedule a split night sleep study at [facility] campus to evaluate your sleep apnea and discuss CPAP options if needed.   Orders: Split night sleep study  Follow-up 2-3 months with Landry NP

## 2024-04-14 NOTE — Progress Notes (Signed)
 @Patient  ID: Olivia Morales, female    DOB: 1951-02-18, 73 y.o.   MRN: [id number]  No chief complaint on file.   Referring provider: Saguier, Edward, PA-C  HPI: 73 year old female, never smoked/passive exposure. PMH significant for HTN, aortic stenosis, CHF, OSA, hypersomnia. Former Dr. Shellia patient, last seen on 09/18/21. HST 03/23/16 >> AHI 29, SpO2 low 63%    04/14/2024- Interim hx  Discussed the use of AI scribe software for clinical note transcription with the patient, who gave verbal consent to proceed.  History of Present Illness Olivia Morales is a 73 year old female with sleep apnea who presents for a sleep consultation. She was referred by Dr. Shellia for a repeat sleep study.  She has a history of sleep apnea, with the last sleep study conducted in 2017, which showed moderate to severe sleep apnea. She was previously on CPAP therapy around 2010 but discontinued it after three months due to discomfort and difficulty in using the device. She used to snore and stop breathing during sleep, but is unsure if these symptoms persist.  She reports significant weight loss of 40 pounds since February 2025, which was unintentional and associated with fatigue and loss of appetite. Her primary care is aware of this. She experiences significant daytime sleepiness, with an Epworth Sleepiness Scale score of 14 out of 24, indicating moderate to high daytime sleepiness. She takes daily naps and sometimes sleeps through the day and night. Her sleep is frequently interrupted by the need to urinate every 15 to 30 minutes. She used to wake up gasping or choking but reports that this no longer occurs.  She has chronic diastolic heart failure and kidney disease, for which she receives injections at a cancer center to manage her red blood cell count. She also has a history of high blood pressure, which was previously uncontrolled at 240/200 mmHg for two months, but is now managed with hydralazine   100 mg three times a day along with other heart medications.  No concern for narcolepsy or cataplexy. Epworth score 14/24.   Allergies  Allergen Reactions   Ace Inhibitors Cough    Immunization History  Administered Date(s) Administered   Fluad Quad(high Dose 65+) 09/10/2017, 04/23/2019, 04/27/2020, 07/11/2021   Fluad Trivalent(High Dose 65+) 02/27/2023   INFLUENZA, HIGH DOSE SEASONAL PF 06/01/2014, 09/10/2017, 04/01/2018   Influenza, Seasonal, Injecte, Preservative Fre 04/01/2016   Influenza,trivalent, recombinat, inj, PF 05/26/2012, 04/13/2013   Moderna Covid-19 Vaccine  Bivalent Booster 63yrs & up 07/11/2021   Moderna SARS-COV2 Booster Vaccination 10/27/2020, 12/15/2020   PPD Test 07/24/2019, 08/28/2019, 09/22/2019   Pneumococcal Conjugate Pcv21, Polysaccharide Crm197 Conjugaf 07/31/2023   Pneumococcal Conjugate-13 09/10/2017, 04/01/2018   Pneumococcal Polysaccharide-23 08/26/2012, 01/19/2020   Tdap 01/07/2006, 06/07/2016   Zoster Recombinant(Shingrix ) 04/10/2018, 02/27/2023, 06/10/2023    Past Medical History:  Diagnosis Date   CHF (congestive heart failure) (HCC)    Diabetes mellitus    Hypercholesteremia    Hypertension    Sickle cell trait    Sleep apnea     Tobacco History: Social History   Tobacco Use  Smoking Status Never   Passive exposure: Current  Smokeless Tobacco Never   Counseling given: Not Answered   Outpatient Medications Prior to Visit  Medication Sig Dispense Refill   amLODipine  (NORVASC ) 10 MG tablet Take 1 tablet (10 mg total) by mouth daily.     aspirin  EC 81 MG tablet Take 81 mg by mouth daily. Swallow whole.     atorvastatin  (LIPITOR) 40 MG tablet  Take 1 tablet (40 mg total) by mouth daily. 90 tablet 0   busPIRone  (BUSPAR ) 7.5 MG tablet Take 1 tablet (7.5 mg total) by mouth 2 (two) times daily. 60 tablet 0   cloNIDine  (CATAPRES ) 0.1 MG tablet Take 1 tablet (0.1 mg total) by mouth 2 (two) times daily.     Cyanocobalamin  (VITAMIN B-12 PO)  Take 1 tablet by mouth once a week.     empagliflozin (JARDIANCE) 10 MG TABS tablet Take 10 mg by mouth daily.     ferrous sulfate 325 (65 FE) MG tablet Take 325 mg by mouth daily with breakfast.     furosemide  (LASIX ) 40 MG tablet Take 1 tablet (40 mg total) by mouth daily. 30 tablet 1   hydrALAZINE  (APRESOLINE ) 100 MG tablet Take 1 tablet (100 mg total) by mouth 3 (three) times daily. 90 tablet 1   isosorbide  mononitrate (IMDUR ) 30 MG 24 hr tablet Take 1 tablet (30 mg total) by mouth daily. 30 tablet 0   Lancets (ONETOUCH DELICA PLUS LANCET33G) MISC USE TO TEST 1-2 TIMES DAILY 100 each 0   lidocaine  (LIDODERM ) 5 % Place 1 patch onto the skin daily. Remove & Discard patch within 12 hours or as directed by MD 30 patch 0   omeprazole  (PRILOSEC) 20 MG capsule Take 20 mg by mouth daily.     ONETOUCH VERIO test strip Check blood sugar 1-2 times daily 100 each 12   potassium chloride  (MICRO-K ) 10 MEQ CR capsule Take 10 mEq by mouth 2 (two) times daily.     No facility-administered medications prior to visit.   Review of Systems  Review of Systems  Constitutional: Negative.   Respiratory: Negative.     Physical Exam  There were no vitals taken for this visit. Physical Exam Constitutional:      Appearance: Normal appearance.  HENT:     Head: Normocephalic and atraumatic.  Cardiovascular:     Rate and Rhythm: Normal rate and regular rhythm.  Pulmonary:     Effort: Pulmonary effort is normal.     Breath sounds: Normal breath sounds. No wheezing or rhonchi.  Neurological:     General: No focal deficit present.     Mental Status: She is alert and oriented to person, place, and time. Mental status is at baseline.  Psychiatric:        Mood and Affect: Mood normal.        Behavior: Behavior normal.        Thought Content: Thought content normal.        Judgment: Judgment normal.      Lab Results:  CBC    Component Value Date/Time   WBC 2.9 (L) 03/19/2024 1445   WBC 4.4 02/14/2024  1450   RBC 4.27 03/19/2024 1446   RBC 4.27 03/19/2024 1445   HGB 11.5 (L) 03/19/2024 1445   HCT 35.3 (L) 03/19/2024 1445   PLT 277 03/19/2024 1445   MCV 82.7 03/19/2024 1445   MCH 26.9 03/19/2024 1445   MCHC 32.6 03/19/2024 1445   RDW 17.9 (H) 03/19/2024 1445   LYMPHSABS 0.6 (L) 03/19/2024 1445   MONOABS 0.4 03/19/2024 1445   EOSABS 0.0 03/19/2024 1445   BASOSABS 0.0 03/19/2024 1445    BMET    Component Value Date/Time   NA 144 03/19/2024 1445   NA 141 11/06/2023 1517   K 4.6 03/19/2024 1445   CL 109 03/19/2024 1445   CO2 24 03/19/2024 1445   GLUCOSE 155 (H) 03/19/2024 1445  BUN 28 (H) 03/19/2024 1445   BUN 19 11/06/2023 1517   CREATININE 2.25 (H) 03/19/2024 1445   CREATININE 2.22 (H) 02/14/2024 1450   CALCIUM  10.1 03/19/2024 1445   GFRNONAA 23 (L) 03/19/2024 1445   GFRNONAA 37 (L) 04/27/2020 1152   GFRAA 42 (L) 04/27/2020 1152    BNP    Component Value Date/Time   BNP 131 (H) 02/14/2024 1450    ProBNP    Component Value Date/Time   PROBNP 8,690.0 (H) 02/04/2024 1630   PROBNP 395.0 (H) 11/04/2023 1503    Imaging: No results found.   Assessment & Plan:   1. OSA (obstructive sleep apnea) (Primary)  Assessment and Plan Assessment & Plan Obstructive sleep apnea with persistent symptoms Persistent symptoms of obstructive sleep apnea, including snoring and sleep disruption, despite significant weight loss since February 2025. Previous sleep study in 2017 indicated moderate to severe sleep apnea. Current Epworth score of 14/24 suggests moderate to high daytime sleepiness. Risks of untreated sleep apnea include cardiac arrhythmias, atrial fibrillation, stroke, pulmonary hypertension, diabetes, and potential increased risk for Alzheimer's disease. She previously used CPAP but was intolerant due to discomfort and difficulty with the mask. She is not open to surgical options like Inspire. - Order split night sleep study at [facility] to assess current  severity of sleep apnea and potential CPAP titration. - Discuss CPAP options if sleep study indicates need, including finding a better mask and pressure setting. - Educate on risks of untreated sleep apnea, including cardiac arrhythmias, atrial fibrillation, stroke, pulmonary hypertension, diabetes, and Alzheimer's disease.  Unintentional weight loss Unintentional weight loss of 40 pounds since February 2025, now weighing 120 pounds. Associated with fatigue and decreased appetite. She has chronic diastolic heart failure and kidney disease, with recent hospitalization for uncontrolled hypertension. Weight loss may have impacted sleep apnea symptoms. - Ensure primary care provider is aware of weight loss and associated symptoms.  Almarie LELON Ferrari, NP 04/14/2024

## 2024-04-18 ENCOUNTER — Telehealth: Payer: Self-pay | Admitting: Medical

## 2024-04-18 ENCOUNTER — Encounter: Payer: Self-pay | Admitting: Medical

## 2024-04-18 ENCOUNTER — Other Ambulatory Visit: Payer: Self-pay | Admitting: Medical

## 2024-04-18 NOTE — Telephone Encounter (Signed)
 Month ago and I think more than one time in past has stressed pt to see GI MD. I believe various times. On past note I gave pt number to Gi office she saw in the past and asked for her to follow up with them. I explained she had area near pancrease that might need contrast to visualize but she has decreased kidney function so did not want to order witih contrast but did want to get Gi opinion. Has pt called them/has she been scheduled?     [facility] [address] [place], [place] [address] Contact Us   P: [phone] F: [phone]   IMPRESSION: Fold thickening along the stomach with slight stranding. Please correlate for any symptoms of gastritis. This does abut the area of the pancreas but there is preserved pancreatic parenchyma at this time. No well-defined fluid collections. Please correlate with specific clinical presentation. An IV contrast study may be of some benefit to further define these structures and abnormality.

## 2024-04-20 ENCOUNTER — Inpatient Hospital Stay

## 2024-04-20 ENCOUNTER — Telehealth: Payer: Self-pay

## 2024-04-20 ENCOUNTER — Other Ambulatory Visit (HOSPITAL_BASED_OUTPATIENT_CLINIC_OR_DEPARTMENT_OTHER): Payer: Self-pay

## 2024-04-20 ENCOUNTER — Ambulatory Visit

## 2024-04-20 ENCOUNTER — Other Ambulatory Visit: Payer: Self-pay

## 2024-04-20 ENCOUNTER — Inpatient Hospital Stay: Admitting: Medical Oncology

## 2024-04-20 MED ORDER — BUSPIRONE HCL 7.5 MG PO TABS
7.5000 mg | ORAL_TABLET | Freq: Two times a day (BID) | ORAL | 0 refills | Status: DC
  Filled 2024-04-20: qty 60, 30d supply, fill #0

## 2024-04-20 NOTE — Telephone Encounter (Signed)
 Copied from CRM #[id number]. Topic: Clinical - Medication Question >> Apr 20, 2024 10:41 AM Tinnie BROCKS wrote: Reason for CRM: Pt was prescribed potassium cl 10mg  in hospital in April (in [place] , not [facility]) and she ran out of the meds today. She is needing to be prescribed this by her PCP for continued refills.  [facility] [address] [place] [address] Phone: [phone] Fax: [phone]

## 2024-04-20 NOTE — Telephone Encounter (Signed)
 Received an rx request for potassium from pharmacy  Per pcp upon review pt does not need it according to labs. Will schedule fu visit with pt to redo labs soon. I called and advised the pharmacy of this and they said they would let pt know

## 2024-04-21 ENCOUNTER — Other Ambulatory Visit: Payer: Self-pay | Admitting: Medical

## 2024-04-21 ENCOUNTER — Other Ambulatory Visit: Payer: Self-pay

## 2024-04-21 ENCOUNTER — Telehealth: Payer: Self-pay | Admitting: Medical

## 2024-04-21 ENCOUNTER — Other Ambulatory Visit (HOSPITAL_BASED_OUTPATIENT_CLINIC_OR_DEPARTMENT_OTHER): Payer: Self-pay

## 2024-04-21 MED ORDER — ISOSORBIDE MONONITRATE ER 30 MG PO TB24
30.0000 mg | ORAL_TABLET | Freq: Every day | ORAL | 0 refills | Status: DC
  Filled 2024-04-21: qty 30, 30d supply, fill #0

## 2024-04-21 NOTE — Telephone Encounter (Signed)
 Copied from CRM [id number]. Topic: General - Call Back - No Documentation >> Apr 21, 2024 11:43 AM Eva FALCON wrote: Reason for CRM: Pt states she received call from office, did not want to give information in regards to the call. Just wanted to notify to have office call her back.  Did you call this pt?

## 2024-04-21 NOTE — Telephone Encounter (Signed)
 Spoke to pt in reference to her significant other  Notes in his chart

## 2024-04-29 ENCOUNTER — Other Ambulatory Visit (HOSPITAL_BASED_OUTPATIENT_CLINIC_OR_DEPARTMENT_OTHER): Payer: Self-pay

## 2024-04-29 ENCOUNTER — Inpatient Hospital Stay: Attending: Medical Oncology

## 2024-04-29 ENCOUNTER — Inpatient Hospital Stay

## 2024-04-29 ENCOUNTER — Other Ambulatory Visit: Payer: Self-pay

## 2024-04-29 ENCOUNTER — Inpatient Hospital Stay: Admitting: Medical Oncology

## 2024-04-29 VITALS — BP 150/73 | HR 63 | Temp 97.5°F | Resp 16 | Ht 64.0 in | Wt 133.0 lb

## 2024-04-29 DIAGNOSIS — R634 Abnormal weight loss: Secondary | ICD-10-CM | POA: Diagnosis not present

## 2024-04-29 DIAGNOSIS — D573 Sickle-cell trait: Secondary | ICD-10-CM | POA: Diagnosis not present

## 2024-04-29 DIAGNOSIS — G4730 Sleep apnea, unspecified: Secondary | ICD-10-CM | POA: Insufficient documentation

## 2024-04-29 DIAGNOSIS — E1122 Type 2 diabetes mellitus with diabetic chronic kidney disease: Secondary | ICD-10-CM | POA: Insufficient documentation

## 2024-04-29 DIAGNOSIS — D72829 Elevated white blood cell count, unspecified: Secondary | ICD-10-CM | POA: Insufficient documentation

## 2024-04-29 DIAGNOSIS — Z7982 Long term (current) use of aspirin: Secondary | ICD-10-CM | POA: Insufficient documentation

## 2024-04-29 DIAGNOSIS — N184 Chronic kidney disease, stage 4 (severe): Secondary | ICD-10-CM | POA: Diagnosis not present

## 2024-04-29 DIAGNOSIS — E7800 Pure hypercholesterolemia, unspecified: Secondary | ICD-10-CM | POA: Insufficient documentation

## 2024-04-29 DIAGNOSIS — Z79899 Other long term (current) drug therapy: Secondary | ICD-10-CM | POA: Diagnosis not present

## 2024-04-29 DIAGNOSIS — N189 Chronic kidney disease, unspecified: Secondary | ICD-10-CM | POA: Diagnosis present

## 2024-04-29 DIAGNOSIS — I130 Hypertensive heart and chronic kidney disease with heart failure and stage 1 through stage 4 chronic kidney disease, or unspecified chronic kidney disease: Secondary | ICD-10-CM | POA: Insufficient documentation

## 2024-04-29 DIAGNOSIS — D631 Anemia in chronic kidney disease: Secondary | ICD-10-CM | POA: Diagnosis present

## 2024-04-29 DIAGNOSIS — I509 Heart failure, unspecified: Secondary | ICD-10-CM | POA: Diagnosis not present

## 2024-04-29 DIAGNOSIS — R935 Abnormal findings on diagnostic imaging of other abdominal regions, including retroperitoneum: Secondary | ICD-10-CM

## 2024-04-29 DIAGNOSIS — R700 Elevated erythrocyte sedimentation rate: Secondary | ICD-10-CM

## 2024-04-29 LAB — CBC WITH DIFFERENTIAL (CANCER CENTER ONLY)
Abs Immature Granulocytes: 0 K/uL (ref 0.00–0.07)
Basophils Absolute: 0 K/uL (ref 0.0–0.1)
Basophils Relative: 1 %
Eosinophils Absolute: 0.1 K/uL (ref 0.0–0.5)
Eosinophils Relative: 2 %
HCT: 26.4 % — ABNORMAL LOW (ref 36.0–46.0)
Hemoglobin: 8.6 g/dL — ABNORMAL LOW (ref 12.0–15.0)
Immature Granulocytes: 0 %
Lymphocytes Relative: 29 %
Lymphs Abs: 1 K/uL (ref 0.7–4.0)
MCH: 27 pg (ref 26.0–34.0)
MCHC: 32.6 g/dL (ref 30.0–36.0)
MCV: 83 fL (ref 80.0–100.0)
Monocytes Absolute: 0.5 K/uL (ref 0.1–1.0)
Monocytes Relative: 15 %
Neutro Abs: 1.7 K/uL (ref 1.7–7.7)
Neutrophils Relative %: 53 %
Platelet Count: 255 K/uL (ref 150–400)
RBC: 3.18 MIL/uL — ABNORMAL LOW (ref 3.87–5.11)
RDW: 15.3 % (ref 11.5–15.5)
WBC Count: 3.2 K/uL — ABNORMAL LOW (ref 4.0–10.5)
nRBC: 0 % (ref 0.0–0.2)

## 2024-04-29 LAB — CMP (CANCER CENTER ONLY)
ALT: 8 U/L (ref 0–44)
AST: 19 U/L (ref 15–41)
Albumin: 4 g/dL (ref 3.5–5.0)
Alkaline Phosphatase: 66 U/L (ref 38–126)
Anion gap: 10 (ref 5–15)
BUN: 27 mg/dL — ABNORMAL HIGH (ref 8–23)
CO2: 25 mmol/L (ref 22–32)
Calcium: 9.6 mg/dL (ref 8.9–10.3)
Chloride: 109 mmol/L (ref 98–111)
Creatinine: 2.45 mg/dL — ABNORMAL HIGH (ref 0.44–1.00)
GFR, Estimated: 20 mL/min — ABNORMAL LOW (ref >60)
Glucose, Bld: 95 mg/dL (ref 70–99)
Potassium: 3.9 mmol/L (ref 3.5–5.1)
Sodium: 145 mmol/L (ref 135–145)
Total Bilirubin: 0.3 mg/dL (ref 0.0–1.2)
Total Protein: 6.6 g/dL (ref 6.5–8.1)

## 2024-04-29 MED ORDER — DARBEPOETIN ALFA 300 MCG/0.6ML IJ SOSY
300.0000 ug | PREFILLED_SYRINGE | Freq: Once | INTRAMUSCULAR | Status: AC
  Administered 2024-04-29: 300 ug via SUBCUTANEOUS
  Filled 2024-04-29: qty 0.6

## 2024-04-29 NOTE — Progress Notes (Signed)
 Hematology and Oncology Follow Up Visit  Olivia Morales [id number] July 26, 1950 73 y.o. 04/29/2024  Past Medical History:  Diagnosis Date   CHF (congestive heart failure) (HCC)    Diabetes mellitus    Hypercholesteremia    Hypertension    Kidney disease    Sickle cell trait    Sleep apnea     Principle Diagnosis:  Anemia in the setting of CKD  Current Therapy:   Aranesp  300 mg Q 3 weeks for Hgb <11     Interim History:  Olivia Morales is back for follow-up for anemia.   Today she reports that she has been well. Weight is finally up which is great- she has been trying to eat more to help with the weight loss she was having. The only bad news since she was last seen is that her husband was found to have stomach cancer. He is being treated at [facility]. Unfortunately it has spread to his liver.   She continues to be seen by multiple specialists including nephrology, cardiology and her PCP. She sees [facility]. She saw cardiology yesterday.   So far in her work up she has had no signs of iron, B12, deficiency. 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 She has had a scantly elevated sed rate of 23. CRP 1.2. EPO level was 9.6. retic count was low at 0.9. Unfortunately she has significant compliance issues with her BP medications.  No chest pain, SOB, new peripheral edema, visual changes or current headaches.   Fatigue has improved with aranesp .   There has been no bleeding to her knowledge: denies epistaxis, gingivitis, hemoptysis, hematemesis, hematuria, melena, excessive bruising, blood donation. .   Wt Readings from Last 3 Encounters:  04/29/24 133 lb (60.3 kg)  04/14/24 127 lb 12.8 oz (58 kg)  03/23/24 131 lb 9.6 oz (59.7 kg)     Medications:   Current Outpatient Medications:    amLODipine  (NORVASC ) 10 MG tablet, Take 1 tablet (10 mg total) by mouth daily., Disp: , Rfl:    aspirin  EC 81 MG tablet, Take 81 mg by mouth daily. Swallow whole., Disp: ,  Rfl:    atorvastatin  (LIPITOR) 40 MG tablet, Take 1 tablet (40 mg total) by mouth daily., Disp: 90 tablet, Rfl: 0   busPIRone  (BUSPAR ) 7.5 MG tablet, Take 1 tablet (7.5 mg total) by mouth 2 (two) times daily., Disp: 60 tablet, Rfl: 0   cloNIDine  (CATAPRES ) 0.1 MG tablet, Take 1 tablet (0.1 mg total) by mouth 2 (two) times daily., Disp: , Rfl:    Cyanocobalamin  (VITAMIN B-12 PO), Take 1 tablet by mouth once a week., Disp: , Rfl:    empagliflozin (JARDIANCE) 10 MG TABS tablet, Take 10 mg by mouth daily., Disp: , Rfl:    ferrous sulfate 325 (65 FE) MG tablet, Take 325 mg by mouth daily with breakfast., Disp: , Rfl:    furosemide  (LASIX ) 40 MG tablet, Take 1 tablet (40 mg total) by mouth daily., Disp: 30 tablet, Rfl: 1   hydrALAZINE  (APRESOLINE ) 100 MG tablet, Take 1 tablet (100 mg total) by mouth 3 (three) times daily., Disp: 90 tablet, Rfl: 1   isosorbide  mononitrate (IMDUR ) 30 MG 24 hr tablet, Take 1 tablet (30 mg total) by mouth daily., Disp: 30 tablet, Rfl: 0   Lancets (ONETOUCH DELICA PLUS LANCET33G) MISC, USE TO TEST 1-2 TIMES DAILY, Disp: 100 each, Rfl: 0   lidocaine  (LIDODERM ) 5 %, Place 1 patch onto the skin daily. Remove & Discard patch within  12 hours or as directed by MD, Disp: 30 patch, Rfl: 0   omeprazole  (PRILOSEC) 20 MG capsule, Take 20 mg by mouth daily., Disp: , Rfl:    ONETOUCH VERIO test strip, Check blood sugar 1-2 times daily, Disp: 100 each, Rfl: 12   potassium chloride  (MICRO-K ) 10 MEQ CR capsule, Take 10 mEq by mouth 2 (two) times daily., Disp: , Rfl:   Allergies:  Allergies  Allergen Reactions   Ace Inhibitors Cough    Past Medical History, Surgical history, Social history, and Family History were reviewed and updated.  Review of Systems: Review of Systems  Constitutional:  Negative for appetite change, chills, diaphoresis, fatigue, fever and unexpected weight change.  HENT:   Negative for hearing loss, mouth sores, nosebleeds and trouble swallowing.   Respiratory:   Negative for chest tightness, cough, hemoptysis, shortness of breath and wheezing.   Cardiovascular:  Negative for chest pain, leg swelling and palpitations.  Gastrointestinal:  Negative for abdominal pain, blood in stool and constipation.  Endocrine: Negative for hot flashes.  Genitourinary:  Negative for hematuria.   Skin:  Negative for rash.  Neurological:  Negative for headaches.  Hematological:  Negative for adenopathy. Does not bruise/bleed easily.  Psychiatric/Behavioral:  The patient is not nervous/anxious.      Physical Exam:  weight is 133 lb (60.3 kg). Her oral temperature is 97.5 F (36.4 C) (abnormal). Her blood pressure is 162/72 (abnormal) and her pulse is 66. Her respiration is 16 and oxygen saturation is 98%.   Physical Exam General: NAD.  Cardiovascular: regular rate and rhythm  Pulmonary: clear ant fields Abdomen: soft, nontender, + bowel sounds GU: no suprapubic tenderness Extremities: no edema, no joint deformities Skin: no rashes Neurological: Weakness but otherwise nonfocal   Lab Results  Component Value Date   WBC 3.2 (L) 04/29/2024   HGB 8.6 (L) 04/29/2024   HCT 26.4 (L) 04/29/2024   MCV 83.0 04/29/2024   PLT 255 04/29/2024     Chemistry      Component Value Date/Time   NA 144 03/19/2024 1445   NA 141 11/06/2023 1517   K 4.6 03/19/2024 1445   CL 109 03/19/2024 1445   CO2 24 03/19/2024 1445   BUN 28 (H) 03/19/2024 1445   BUN 19 11/06/2023 1517   CREATININE 2.25 (H) 03/19/2024 1445   CREATININE 2.22 (H) 02/14/2024 1450      Component Value Date/Time   CALCIUM  10.1 03/19/2024 1445   ALKPHOS 63 03/19/2024 1445   AST 16 03/19/2024 1445   ALT 5 03/19/2024 1445   BILITOT 0.4 03/19/2024 1445     Encounter Diagnoses  Name Primary?   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  Yes   Leukocytosis, unspecified type    Unintentional weight loss      Assessment and Plan- Patient is a 73 y.o. female who was referred to us   for anemia in the setting of elevated ferritin level. She has known sickle cell trait.    CBC from 11/04/2023: WBC 2.5, Hgb 9.4, MCV 85.5, Monocytes relative, ANC 1.4, platelets 243. Creatinine 2.20, normal LFTs. She has a history of elevated ferritin levels likely secondary to inflammation with mildly elevated CRP and Sed rate. UPEP pending however thus far her MM work up has reflected her chronic health conditions with no evidence of a M-spike. She has a history of low EPO levels with elevated creatinine.   At her recent visit we obtained a CA 19.9 and CEA based off of  a CT C/A/P from March. These were in normal range.   CBC shows a Hgb of 8.6. WBC is improved to 3.2 with increased oral intake today with ANC of 1.7 Aranesp  today if BP improves   Disposition: Aranesp  today if repeat BP is in target range  RTC 1 month labs(CBC w/, CMP,  retic), APP, Aranesp  Injection (afternoon apt)   Lauraine Dais PA-C 10/29/20253:12 PM

## 2024-04-29 NOTE — Patient Instructions (Signed)

## 2024-04-30 ENCOUNTER — Other Ambulatory Visit: Payer: Self-pay | Admitting: Medical

## 2024-04-30 ENCOUNTER — Other Ambulatory Visit: Payer: Self-pay | Admitting: Cardiology

## 2024-04-30 ENCOUNTER — Other Ambulatory Visit (HOSPITAL_BASED_OUTPATIENT_CLINIC_OR_DEPARTMENT_OTHER): Payer: Self-pay

## 2024-04-30 ENCOUNTER — Telehealth: Payer: Self-pay

## 2024-04-30 ENCOUNTER — Other Ambulatory Visit: Payer: Self-pay

## 2024-04-30 MED ORDER — FUROSEMIDE 20 MG PO TABS
20.0000 mg | ORAL_TABLET | Freq: Every day | ORAL | 3 refills | Status: DC
  Filled 2024-04-30: qty 30, 30d supply, fill #0

## 2024-04-30 MED ORDER — CLONIDINE HCL 0.1 MG PO TABS
0.1000 mg | ORAL_TABLET | Freq: Two times a day (BID) | ORAL | 0 refills | Status: DC
  Filled 2024-04-30: qty 60, 30d supply, fill #0

## 2024-04-30 MED ORDER — FUROSEMIDE 40 MG PO TABS
40.0000 mg | ORAL_TABLET | Freq: Every day | ORAL | 1 refills | Status: DC
  Filled 2024-04-30: qty 30, 30d supply, fill #0

## 2024-04-30 MED ORDER — FUROSEMIDE 20 MG PO TABS
20.0000 mg | ORAL_TABLET | ORAL | 0 refills | Status: DC
  Filled 2024-04-30 – 2024-05-21 (×2): qty 30, 90d supply, fill #0
  Filled 2024-05-21 – 2024-05-25 (×2): qty 30, 90d supply, fill #1

## 2024-04-30 NOTE — Addendum Note (Signed)
 Addended by: DORINA DALLAS HERO on: 04/30/2024 09:11 AM   Modules accepted: Orders

## 2024-04-30 NOTE — Addendum Note (Signed)
 Addended by: DORINA DALLAS HERO on: 04/30/2024 10:04 AM   Modules accepted: Orders

## 2024-04-30 NOTE — Telephone Encounter (Signed)
 Pt says that [facility] can't have her rx clonidine  ready until tomorrow at 3 pm she wants to know will this effect her health as this is her heart medication. Advised her I would ask pcp

## 2024-05-21 ENCOUNTER — Other Ambulatory Visit: Payer: Self-pay | Admitting: Medical

## 2024-05-21 ENCOUNTER — Encounter: Payer: Self-pay | Admitting: Hematology & Oncology

## 2024-05-21 ENCOUNTER — Other Ambulatory Visit (HOSPITAL_COMMUNITY): Payer: Self-pay

## 2024-05-21 ENCOUNTER — Other Ambulatory Visit: Payer: Self-pay

## 2024-05-22 ENCOUNTER — Other Ambulatory Visit (HOSPITAL_COMMUNITY): Payer: Self-pay

## 2024-05-22 MED ORDER — ISOSORBIDE MONONITRATE ER 30 MG PO TB24
30.0000 mg | ORAL_TABLET | Freq: Every day | ORAL | 0 refills | Status: DC
  Filled 2024-05-22 – 2024-05-25 (×2): qty 30, 30d supply, fill #0

## 2024-05-25 ENCOUNTER — Other Ambulatory Visit: Payer: Self-pay | Admitting: Medical

## 2024-05-25 ENCOUNTER — Other Ambulatory Visit (HOSPITAL_COMMUNITY): Payer: Self-pay

## 2024-05-25 ENCOUNTER — Other Ambulatory Visit (HOSPITAL_BASED_OUTPATIENT_CLINIC_OR_DEPARTMENT_OTHER): Payer: Self-pay

## 2024-05-25 ENCOUNTER — Other Ambulatory Visit: Payer: Self-pay | Admitting: *Deleted

## 2024-05-25 ENCOUNTER — Encounter: Payer: Self-pay | Admitting: Hematology & Oncology

## 2024-05-25 MED ORDER — FUROSEMIDE 20 MG PO TABS: 20.0000 mg | ORAL_TABLET | ORAL | 0 refills | Status: AC

## 2024-05-25 MED ORDER — CLONIDINE HCL 0.1 MG PO TABS
0.1000 mg | ORAL_TABLET | Freq: Two times a day (BID) | ORAL | 1 refills | Status: DC
  Filled 2024-05-25: qty 180, 90d supply, fill #0

## 2024-05-25 MED ORDER — BUSPIRONE HCL 7.5 MG PO TABS
7.5000 mg | ORAL_TABLET | Freq: Two times a day (BID) | ORAL | 1 refills | Status: AC
  Filled 2024-05-25 – 2024-06-08 (×2): qty 60, 30d supply, fill #0
  Filled 2024-07-10: qty 60, 30d supply, fill #1

## 2024-05-25 MED ORDER — HYDRALAZINE HCL 100 MG PO TABS
100.0000 mg | ORAL_TABLET | Freq: Three times a day (TID) | ORAL | 0 refills | Status: AC
  Filled 2024-05-25: qty 270, 90d supply, fill #0

## 2024-05-29 ENCOUNTER — Other Ambulatory Visit (HOSPITAL_BASED_OUTPATIENT_CLINIC_OR_DEPARTMENT_OTHER): Payer: Self-pay

## 2024-05-29 MED ORDER — EMPAGLIFLOZIN 10 MG PO TABS
10.0000 mg | ORAL_TABLET | Freq: Every day | ORAL | 6 refills | Status: AC
  Filled 2024-05-29: qty 30, 30d supply, fill #0
  Filled 2024-06-23: qty 30, 30d supply, fill #1
  Filled 2024-07-24 (×2): qty 30, 30d supply, fill #2

## 2024-06-01 ENCOUNTER — Inpatient Hospital Stay

## 2024-06-01 ENCOUNTER — Inpatient Hospital Stay: Admitting: Medical Oncology

## 2024-06-01 ENCOUNTER — Encounter: Payer: Self-pay | Admitting: Medical Oncology

## 2024-06-01 ENCOUNTER — Inpatient Hospital Stay: Attending: Medical Oncology

## 2024-06-01 VITALS — BP 149/56 | HR 63 | Temp 97.9°F | Resp 18 | Ht 64.0 in | Wt 138.8 lb

## 2024-06-01 DIAGNOSIS — Z79899 Other long term (current) drug therapy: Secondary | ICD-10-CM | POA: Insufficient documentation

## 2024-06-01 DIAGNOSIS — R634 Abnormal weight loss: Secondary | ICD-10-CM | POA: Diagnosis not present

## 2024-06-01 DIAGNOSIS — D631 Anemia in chronic kidney disease: Secondary | ICD-10-CM | POA: Diagnosis present

## 2024-06-01 DIAGNOSIS — D72829 Elevated white blood cell count, unspecified: Secondary | ICD-10-CM | POA: Insufficient documentation

## 2024-06-01 DIAGNOSIS — I130 Hypertensive heart and chronic kidney disease with heart failure and stage 1 through stage 4 chronic kidney disease, or unspecified chronic kidney disease: Secondary | ICD-10-CM | POA: Diagnosis not present

## 2024-06-01 DIAGNOSIS — N189 Chronic kidney disease, unspecified: Secondary | ICD-10-CM | POA: Diagnosis not present

## 2024-06-01 DIAGNOSIS — G4730 Sleep apnea, unspecified: Secondary | ICD-10-CM | POA: Insufficient documentation

## 2024-06-01 DIAGNOSIS — E7800 Pure hypercholesterolemia, unspecified: Secondary | ICD-10-CM | POA: Insufficient documentation

## 2024-06-01 DIAGNOSIS — N184 Chronic kidney disease, stage 4 (severe): Secondary | ICD-10-CM | POA: Insufficient documentation

## 2024-06-01 DIAGNOSIS — I5032 Chronic diastolic (congestive) heart failure: Secondary | ICD-10-CM | POA: Diagnosis not present

## 2024-06-01 DIAGNOSIS — E1122 Type 2 diabetes mellitus with diabetic chronic kidney disease: Secondary | ICD-10-CM | POA: Diagnosis not present

## 2024-06-01 LAB — CMP (CANCER CENTER ONLY)
ALT: 12 U/L (ref 0–44)
AST: 22 U/L (ref 15–41)
Albumin: 3.9 g/dL (ref 3.5–5.0)
Alkaline Phosphatase: 78 U/L (ref 38–126)
Anion gap: 11 (ref 5–15)
BUN: 30 mg/dL — ABNORMAL HIGH (ref 8–23)
CO2: 24 mmol/L (ref 22–32)
Calcium: 9.3 mg/dL (ref 8.9–10.3)
Chloride: 106 mmol/L (ref 98–111)
Creatinine: 2.62 mg/dL — ABNORMAL HIGH (ref 0.44–1.00)
GFR, Estimated: 19 mL/min — ABNORMAL LOW (ref >60)
Glucose, Bld: 180 mg/dL — ABNORMAL HIGH (ref 70–99)
Potassium: 4 mmol/L (ref 3.5–5.1)
Sodium: 141 mmol/L (ref 135–145)
Total Bilirubin: 0.3 mg/dL (ref 0.0–1.2)
Total Protein: 6.5 g/dL (ref 6.5–8.1)

## 2024-06-01 LAB — IRON AND IRON BINDING CAPACITY (CC-WL,HP ONLY)
Iron: 59 ug/dL (ref 28–170)
Saturation Ratios: 23 % (ref 10.4–31.8)
TIBC: 252 ug/dL (ref 250–450)
UIBC: 193 ug/dL

## 2024-06-01 LAB — CBC WITH DIFFERENTIAL (CANCER CENTER ONLY)
Abs Immature Granulocytes: 0.01 K/uL (ref 0.00–0.07)
Basophils Absolute: 0.1 K/uL (ref 0.0–0.1)
Basophils Relative: 2 %
Eosinophils Absolute: 0.1 K/uL (ref 0.0–0.5)
Eosinophils Relative: 2 %
HCT: 31.8 % — ABNORMAL LOW (ref 36.0–46.0)
Hemoglobin: 10.1 g/dL — ABNORMAL LOW (ref 12.0–15.0)
Immature Granulocytes: 0 %
Lymphocytes Relative: 34 %
Lymphs Abs: 0.9 K/uL (ref 0.7–4.0)
MCH: 27 pg (ref 26.0–34.0)
MCHC: 31.8 g/dL (ref 30.0–36.0)
MCV: 85 fL (ref 80.0–100.0)
Monocytes Absolute: 0.3 K/uL (ref 0.1–1.0)
Monocytes Relative: 10 %
Neutro Abs: 1.3 K/uL — ABNORMAL LOW (ref 1.7–7.7)
Neutrophils Relative %: 52 %
Platelet Count: 272 K/uL (ref 150–400)
RBC: 3.74 MIL/uL — ABNORMAL LOW (ref 3.87–5.11)
RDW: 16.2 % — ABNORMAL HIGH (ref 11.5–15.5)
WBC Count: 2.5 K/uL — ABNORMAL LOW (ref 4.0–10.5)
nRBC: 0 % (ref 0.0–0.2)

## 2024-06-01 LAB — RETIC PANEL
Immature Retic Fract: 4.6 % (ref 2.3–15.9)
RBC.: 3.76 MIL/uL — ABNORMAL LOW (ref 3.87–5.11)
Retic Count, Absolute: 22.2 K/uL (ref 19.0–186.0)
Retic Ct Pct: 0.6 % (ref 0.4–3.1)
Reticulocyte Hemoglobin: 30.3 pg (ref >27.9)

## 2024-06-01 LAB — VITAMIN B12: Vitamin B-12: 289 pg/mL (ref 180–914)

## 2024-06-01 LAB — FOLATE: Folate: 9.9 ng/mL (ref >5.9)

## 2024-06-01 LAB — FERRITIN: Ferritin: 225 ng/mL (ref 11–307)

## 2024-06-01 MED ORDER — DARBEPOETIN ALFA 300 MCG/0.6ML IJ SOSY
300.0000 ug | PREFILLED_SYRINGE | Freq: Once | INTRAMUSCULAR | Status: AC
  Administered 2024-06-01: 300 ug via SUBCUTANEOUS
  Filled 2024-06-01: qty 0.6

## 2024-06-01 NOTE — Patient Instructions (Addendum)

## 2024-06-01 NOTE — Progress Notes (Signed)
 Hematology and Oncology Follow Up Visit  Olivia Morales [id number] June 19, 1951 73 y.o. 06/01/2024  Past Medical History:  Diagnosis Date   CHF (congestive heart failure) (HCC)    Diabetes mellitus    Hypercholesteremia    Hypertension    Kidney disease    Sickle cell trait    Sleep apnea     Principle Diagnosis:  Anemia in the setting of CKD  Current Therapy:   Aranesp  300 mg Q 3 weeks for Hgb <11     Interim History:  Olivia Morales is back for follow-up for anemia.   Today she reports that she has been doing great. Her husband who has stage 4 stomach cancer starts chemo on Thursday- FOLFOX.   She continues to eat well and weight is up.   She continues to be seen by multiple specialists including nephrology, cardiology and her PCP. She sees [facility]. She saw cardiology yesterday.   So far in her work up she has had no signs of iron, B12, deficiency. 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 She has had a scantly elevated sed rate of 23. CRP 1.2. EPO level was 9.6. retic count was low at 0.9. Unfortunately she has significant compliance issues with her BP medications.  No chest pain, SOB, new peripheral edema, visual changes or current headaches.   Fatigue has improved with aranesp .   There has been no bleeding to her knowledge: denies epistaxis, gingivitis, hemoptysis, hematemesis, hematuria, melena, excessive bruising, blood donation. .   Wt Readings from Last 3 Encounters:  06/01/24 138 lb 12.8 oz (63 kg)  04/29/24 133 lb (60.3 kg)  04/14/24 127 lb 12.8 oz (58 kg)     Medications:   Current Outpatient Medications:    amLODipine  (NORVASC ) 10 MG tablet, Take 1 tablet (10 mg total) by mouth daily., Disp: , Rfl:    aspirin  EC 81 MG tablet, Take 81 mg by mouth daily. Swallow whole., Disp: , Rfl:    atorvastatin  (LIPITOR) 40 MG tablet, Take 1 tablet (40 mg total) by mouth daily., Disp: 90 tablet, Rfl: 0   busPIRone  (BUSPAR ) 7.5 MG tablet, Take 1 tablet  (7.5 mg total) by mouth 2 (two) times daily., Disp: 180 tablet, Rfl: 1   cloNIDine  (CATAPRES ) 0.1 MG tablet, Take 1 tablet (0.1 mg total) by mouth 2 (two) times daily., Disp: 180 tablet, Rfl: 1   Cyanocobalamin  (VITAMIN B-12 PO), Take 1 tablet by mouth once a week., Disp: , Rfl:    empagliflozin (JARDIANCE) 10 MG TABS tablet, Take 10 mg by mouth daily., Disp: , Rfl:    empagliflozin (JARDIANCE) 10 MG TABS tablet, Take 1 tablet (10 mg total) by mouth daily., Disp: 30 tablet, Rfl: 6   ferrous sulfate 325 (65 FE) MG tablet, Take 325 mg by mouth daily with breakfast., Disp: , Rfl:    furosemide  (LASIX ) 20 MG tablet, Take 1 tablet (20 mg total) by mouth every 3 (three) days., Disp: 90 tablet, Rfl: 0   hydrALAZINE  (APRESOLINE ) 100 MG tablet, Take 1 tablet (100 mg total) by mouth 3 (three) times daily., Disp: 270 tablet, Rfl: 0   isosorbide  mononitrate (IMDUR ) 30 MG 24 hr tablet, Take 1 tablet (30 mg total) by mouth daily., Disp: 30 tablet, Rfl: 0   Lancets (ONETOUCH DELICA PLUS LANCET33G) MISC, USE TO TEST 1-2 TIMES DAILY, Disp: 100 each, Rfl: 0   lidocaine  (LIDODERM ) 5 %, Place 1 patch onto the skin daily. Remove & Discard patch within 12 hours or as directed  by MD, Disp: 30 patch, Rfl: 0   omeprazole  (PRILOSEC) 20 MG capsule, Take 20 mg by mouth daily., Disp: , Rfl:    ONETOUCH VERIO test strip, Check blood sugar 1-2 times daily, Disp: 100 each, Rfl: 12   potassium chloride  (MICRO-K ) 10 MEQ CR capsule, Take 10 mEq by mouth 2 (two) times daily., Disp: , Rfl:  No current facility-administered medications for this visit.  Facility-Administered Medications Ordered in Other Visits:    Darbepoetin Alfa  (ARANESP ) injection 300 mcg, 300 mcg, Subcutaneous, Once, Markham Dumlao M, [place]  Allergies:  Allergies  Allergen Reactions   Ace Inhibitors Cough    Past Medical History, Surgical history, Social history, and Family History were reviewed and updated.  Review of Systems: Review of Systems   Constitutional:  Negative for appetite change, chills, diaphoresis, fatigue, fever and unexpected weight change.  HENT:   Negative for hearing loss, mouth sores, nosebleeds and trouble swallowing.   Respiratory:  Negative for chest tightness, cough, hemoptysis, shortness of breath and wheezing.   Cardiovascular:  Negative for chest pain, leg swelling and palpitations.  Gastrointestinal:  Negative for abdominal pain, blood in stool and constipation.  Endocrine: Negative for hot flashes.  Genitourinary:  Negative for hematuria.   Skin:  Negative for rash.  Neurological:  Negative for headaches.  Hematological:  Negative for adenopathy. Does not bruise/bleed easily.  Psychiatric/Behavioral:  The patient is not nervous/anxious.      Physical Exam:  height is 5' 4 (1.626 m) and weight is 138 lb 12.8 oz (63 kg). Her oral temperature is 97.9 F (36.6 C). Her blood pressure is 149/56 (abnormal) and her pulse is 63. Her respiration is 18 and oxygen saturation is 100%.   Physical Exam General: NAD.  Cardiovascular: regular rate and rhythm  Pulmonary: clear ant fields Abdomen: soft, nontender, + bowel sounds GU: no suprapubic tenderness Extremities: no edema, no joint deformities Skin: no rashes Neurological: Weakness but otherwise nonfocal   Lab Results  Component Value Date   WBC 2.5 (L) 06/01/2024   HGB 10.1 (L) 06/01/2024   HCT 31.8 (L) 06/01/2024   MCV 85.0 06/01/2024   PLT 272 06/01/2024     Chemistry      Component Value Date/Time   NA 145 04/29/2024 1433   NA 141 11/06/2023 1517   K 3.9 04/29/2024 1433   CL 109 04/29/2024 1433   CO2 25 04/29/2024 1433   BUN 27 (H) 04/29/2024 1433   BUN 19 11/06/2023 1517   CREATININE 2.45 (H) 04/29/2024 1433   CREATININE 2.22 (H) 02/14/2024 1450      Component Value Date/Time   CALCIUM  9.6 04/29/2024 1433   ALKPHOS 66 04/29/2024 1433   AST 19 04/29/2024 1433   ALT 8 04/29/2024 1433   BILITOT 0.3 04/29/2024 1433     Encounter  Diagnoses  Name Primary?   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  Yes   Leukocytosis, unspecified type    Unintentional weight loss    Assessment and Plan- Patient is a 73 y.o. female who was referred to us  for anemia in the setting of elevated ferritin level. She has known sickle cell trait.    CBC from 11/04/2023: WBC 2.5, Hgb 9.4, MCV 85.5, Monocytes relative, ANC 1.4, platelets 243. Creatinine 2.20, normal LFTs. She has a history of elevated ferritin levels likely secondary to inflammation with mildly elevated CRP and Sed rate. UPEP pending however thus far her MM work up has reflected her chronic health conditions with  no evidence of a M-spike. She has a history of low EPO levels with elevated creatinine.   At her recent visit we obtained a CA 19.9 and CEA based off of a CT C/A/P from March. These were in normal range.   No recurring infections. No night sweats and her unintentional weight loss has stopped.   CBC today shows a Hgb of 10.1.  WBC is 2.5 with ANC of 1.3 Aranesp  today    Disposition: Aranesp  today RTC 1 month labs(CBC w/, CMP,  retic), APP, Aranesp  Injection (afternoon apt)   Lauraine Dais PA-C 12/1/20253:22 PM

## 2024-06-03 ENCOUNTER — Ambulatory Visit: Payer: Self-pay | Admitting: Medical Oncology

## 2024-06-08 ENCOUNTER — Telehealth: Payer: Self-pay

## 2024-06-08 ENCOUNTER — Other Ambulatory Visit (HOSPITAL_BASED_OUTPATIENT_CLINIC_OR_DEPARTMENT_OTHER): Payer: Self-pay

## 2024-06-08 ENCOUNTER — Other Ambulatory Visit: Payer: Self-pay

## 2024-06-08 NOTE — Telephone Encounter (Signed)
 Called pt and said she needs a refill on buspar  told her it was filled on 11/24 notified her to call pharmacy

## 2024-06-15 ENCOUNTER — Ambulatory Visit (HOSPITAL_BASED_OUTPATIENT_CLINIC_OR_DEPARTMENT_OTHER): Admitting: Pulmonary Disease

## 2024-06-17 ENCOUNTER — Other Ambulatory Visit (HOSPITAL_BASED_OUTPATIENT_CLINIC_OR_DEPARTMENT_OTHER): Payer: Self-pay

## 2024-06-17 ENCOUNTER — Ambulatory Visit (HOSPITAL_BASED_OUTPATIENT_CLINIC_OR_DEPARTMENT_OTHER): Admitting: Cardiovascular Disease

## 2024-06-17 ENCOUNTER — Encounter (HOSPITAL_BASED_OUTPATIENT_CLINIC_OR_DEPARTMENT_OTHER): Payer: Self-pay | Admitting: Cardiovascular Disease

## 2024-06-17 VITALS — BP 142/70 | HR 63 | Ht 64.0 in | Wt 140.0 lb

## 2024-06-17 DIAGNOSIS — E1169 Type 2 diabetes mellitus with other specified complication: Secondary | ICD-10-CM

## 2024-06-17 DIAGNOSIS — G4733 Obstructive sleep apnea (adult) (pediatric): Secondary | ICD-10-CM

## 2024-06-17 DIAGNOSIS — E1159 Type 2 diabetes mellitus with other circulatory complications: Secondary | ICD-10-CM

## 2024-06-17 DIAGNOSIS — E785 Hyperlipidemia, unspecified: Secondary | ICD-10-CM | POA: Diagnosis not present

## 2024-06-17 DIAGNOSIS — I152 Hypertension secondary to endocrine disorders: Secondary | ICD-10-CM | POA: Diagnosis not present

## 2024-06-17 DIAGNOSIS — I5032 Chronic diastolic (congestive) heart failure: Secondary | ICD-10-CM

## 2024-06-17 DIAGNOSIS — I350 Nonrheumatic aortic (valve) stenosis: Secondary | ICD-10-CM

## 2024-06-17 MED ORDER — CLONIDINE HCL 0.2 MG PO TABS
0.2000 mg | ORAL_TABLET | Freq: Two times a day (BID) | ORAL | 3 refills | Status: AC
  Filled 2024-06-17: qty 120, 60d supply, fill #0
  Filled 2024-06-17: qty 60, 30d supply, fill #0

## 2024-06-17 NOTE — Progress Notes (Signed)
 Advanced Hypertension Clinic Initial Assessment:    Date:  06/17/2024   ID:  Olivia Morales, DOB [birth year], MRN [id number]  PCP:  Dorina Loving, PA-C  Cardiologist:  Lamar Fitch, MD  Nephrologist:  Referring MD: Carlin Delon BROCKS, NP   CC: Hypertension  History of Present Illness:    Olivia Morales is a 73 y.o. female with a hx of hypertension, hyperlipidemia, diabetes, CKD 4, OSA, and HFpEF here to establish care in the Advanced Hypertension Clinic.  She establish care with Dr. Fitch on 08/2023 for hypertension and edema.  proBNP was 2910 and she was hospitalized for HFpEF and volume overload.  She required IV diuretics.  She was admitted 01/2024 with hypertensive emergency.  Systolic blood pressure was 230.  He last followed up with Delon Carlin, NP 03/2024 and her blood pressure was 150/80.  Renal Dopplers were negative.  She has known OSA but does not tolerate CPAP.  She continued on amlodipine , clonidine , and hydralazine .  Imdur  had been recently added.  She had not tolerated beta-blockers due to bradycardia.  She was encouraged to follow-up with [facility] again for her chronic kidney disease, and she had not seen them for a while and did not recommend that she still have kidney disease.  Discussed the use of AI scribe software for clinical note transcription with the patient, who gave verbal consent to proceed.  History of Present Illness Olivia Morales has a history of chronic diastolic heart failure and kidney disease, diagnosed in April after falling ill in February. She was hospitalized on September 23rd and has since felt much better, particularly with her blood pressure management. Her blood pressure readings, previously as high as 240/200 mmHg, now range between 131/70 mmHg and 150/87 mmHg. She is currently on hydralazine  100 mg three times a day, amlodipine , Imdur , and clonidine  for blood pressure control, as well as Jardiance  and Lasix  for fluid  management.  She experiences swelling, particularly in the mornings, and sometimes develops swelling after activity such as cooking or eating breakfast. She uses furosemide  every three days but initially took it three times a day, leading to rapid depletion of her supply. She uses Ace bandages and a wedge for elevation and compression to manage swelling.  She has a history of anxiety, for which she takes medication that she finds very effective. She describes significant stress related to her health issues and her boyfriend's cancer diagnosis. She has experienced weight loss, dropping from 160 pounds to 120 pounds, but has since regained weight and is currently at 140 pounds. Her appetite has improved, and she enjoys cooking and eating a variety of foods.  Her family history is significant for high blood pressure and diabetes. Her mother and both daughters have high blood pressure, and one daughter underwent double bypass surgery. She has had high blood pressure for approximately 30 years and has been on treatment for over 25 years.  Socially, she does not smoke, drinks alcohol infrequently, and consumes decaffeinated coffee. She takes gummy vitamins and Total Beets as supplements. 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 She has been living in   for 25 years after moving from [place]  following her husband's death from cancer.  Previous antihypertensives:   Past Medical History:  Diagnosis Date   CHF (congestive heart failure) (HCC)    Diabetes mellitus    Hypercholesteremia    Hypertension    Kidney disease    Sickle cell trait    Sleep apnea  Past Surgical History:  Procedure Laterality Date   TUBAL LIGATION      Current Medications: Active Medications[1]   Allergies:   Ace inhibitors   Social History   Socioeconomic History   Marital status: Widowed    Spouse name: Not on file   Number of children: Not on file   Years of education:  Not on file   Highest education level: Not on file  Occupational History   Not on file  Tobacco Use   Smoking status: Never    Passive exposure: Current   Smokeless tobacco: Never  Vaping Use   Vaping status: Never Used  Substance and Sexual Activity   Alcohol use: Yes    Comment: drinks beer on occassion   Drug use: Not Currently    Types: Crack cocaine    Comment: history of smoking crack ...it's been a 1.5 years   Sexual activity: Not on file  Other Topics Concern   Not on file  Social History Narrative   Not on file   Social Drivers of Health   Tobacco Use: Medium Risk (06/17/2024)   Patient History    Smoking Tobacco Use: Never    Smokeless Tobacco Use: Never    Passive Exposure: Current  Financial Resource Strain: Low Risk (06/17/2024)   Overall Financial Resource Strain (CARDIA)    Difficulty of Paying Living Expenses: Not very hard  Food Insecurity: Food Insecurity Present (06/17/2024)   Epic    Worried About Programme Researcher, Broadcasting/film/video in the Last Year: Often true    Ran Out of Food in the Last Year: Often true  Transportation Needs: No Transportation Needs (06/17/2024)   Epic    Lack of Transportation (Medical): No    Lack of Transportation (Non-Medical): No  Physical Activity: Insufficiently Active (06/17/2024)   Exercise Vital Sign    Days of Exercise per Week: 7 days    Minutes of Exercise per Session: 10 min  Stress: Stress Concern Present (06/17/2024)   [employer] of Occupational Health - Occupational Stress Questionnaire    Feeling of Stress: Rather much  Social Connections: Moderately Integrated (06/17/2024)   Social Connection and Isolation Panel    Frequency of Communication with Friends and Family: More than three times a week    Frequency of Social Gatherings with Friends and Family: Three times a week    Attends Religious Services: More than 4 times per year    Active Member of Clubs or Organizations: No    Attends Banker  Meetings: Never    Marital Status: Living with partner  Depression (PHQ2-9): Low Risk (06/01/2024)   Depression (PHQ2-9)    PHQ-2 Score: 0  Recent Concern: Depression (PHQ2-9) - High Risk (03/23/2024)   Depression (PHQ2-9)    PHQ-2 Score: 19  Alcohol Screen: Low Risk (06/17/2024)   Alcohol Screen    Last Alcohol Screening Score (AUDIT): 1  Housing: Low Risk (06/17/2024)   Epic    Unable to Pay for Housing in the Last Year: No    Number of Times Moved in the Last Year: 0    Homeless in the Last Year: No  Utilities: Not At Risk (06/17/2024)   Epic    Threatened with loss of utilities: No  Health Literacy: Adequate Health Literacy (06/17/2024)   B1300 Health Literacy    Frequency of need for help with medical instructions: Never     Family History: The patient's family history includes Coronary artery disease in her daughter; Hypertension in  her daughter, daughter, and mother; Sickle cell anemia in her father.  ROS:   Please see the history of present illness.     All other systems reviewed and are negative.  EKGs/Labs/Other Studies Reviewed:    EKG:  EKG is not ordered today.    Echo 10/20/23:  1. Left ventricular ejection fraction, by estimation, is 60 to 65%. The  left ventricle has normal function. The left ventricle has no regional  wall motion abnormalities. There is moderate left ventricular hypertrophy.  Left ventricular diastolic  parameters are consistent with Grade II diastolic dysfunction  (pseudonormalization).   2. Right ventricular systolic function is normal. The right ventricular  size is normal.   3. Left atrial size was moderately dilated.   4. The mitral valve is normal in structure. Mild mitral valve  regurgitation. No evidence of mitral stenosis.   5. The aortic valve is normal in structure. Aortic valve regurgitation is  not visualized. No aortic stenosis is present.   6. The inferior vena cava is normal in size with greater than 50%  respiratory  variability, suggesting right atrial pressure of 3 mmHg.   Lexiscan  Myoview  10/2023:   The study is normal. The study is low risk.   No ST deviation was noted.   LV perfusion is normal. There is no evidence of ischemia. There is no evidence of infarction.   Left ventricular function is normal. Nuclear stress EF: 74%. The left ventricular ejection fraction is hyperdynamic (>65%). End diastolic cavity size is normal. End systolic cavity size is normal.    Recent Labs: 01/30/2024: TSH 1.910 02/04/2024: Pro Brain Natriuretic Peptide 8,690.0 02/06/2024: Magnesium  1.7 02/14/2024: Brain Natriuretic Peptide 131 06/01/2024: ALT 12; BUN 30; Creatinine 2.62; Hemoglobin 10.1; Platelet Count 272; Potassium 4.0; Sodium 141   Recent Lipid Panel    Component Value Date/Time   CHOL 152 08/02/2021 0800   TRIG 76.0 08/02/2021 0800   HDL 46.60 08/02/2021 0800   CHOLHDL 3 08/02/2021 0800   VLDL 15.2 08/02/2021 0800   LDLCALC 90 08/02/2021 0800   LDLCALC 98 03/03/2021 1557    Physical Exam:    VS:  BP (!) 142/70 (BP Location: Left Arm, Patient Position: Sitting, Cuff Size: Normal)   Pulse 63   Ht 5' 4 (1.626 m)   Wt 140 lb (63.5 kg)   SpO2 99%   BMI 24.03 kg/m  , BMI Body mass index is 24.03 kg/m. GENERAL:  Well appearing HEENT: Pupils equal round and reactive, fundi not visualized, oral mucosa unremarkable NECK:  No jugular venous distention, waveform within normal limits, carotid upstroke brisk and symmetric, no bruits, no thyromegaly LUNGS:  Clear to auscultation bilaterally HEART:  RRR.  PMI not displaced or sustained,S1 and S2 within normal limits, no S3, no S4, no clicks, no rubs, no murmurs ABD:  Flat, positive bowel sounds normal in frequency in pitch, no bruits, no rebound, no guarding, no midline pulsatile mass, no hepatomegaly, no splenomegaly EXT:  2 plus pulses throughout, no edema, no cyanosis no clubbing SKIN:  No rashes no nodules NEURO:  Cranial nerves II through XII grossly intact,  motor grossly intact throughout PSYCH:  Cognitively intact, oriented to person place and time   ASSESSMENT/PLAN:    Assessment & Plan # Hypertension associated with type 2 diabetes mellitus Hypertension improved since September hospitalization. Current readings 131/70 to 154/76. Previous readings up to 240/200. Blood pressure control essential for heart failure management.  Medication options are limited by renal function.  - Increased  clonidine  to 0.2 mg twice daily. - Continue current antihypertensive regimen. - Monitor blood pressure regularly and maintain log. - Provided educational materials on hypertension management.  # Chronic heart failure with preserved ejection fraction (HFpEF) Chronic diastolic heart failure diagnosed in April. Symptoms improved since September hospitalization. Morning swelling persists but less severe. Fluid management critical to prevent exacerbation. - Increase furosemide  to four consecutive days for fluid retention. - Continue current heart failure management regimen. - Monitor for fluid overload and adjust diuretics as needed. - BP control as above - Continue Jardiance , hydralizine, Imdur   # Hyperlipidemia:  # DM:  Continue aspirin  and statin.   Screening for Secondary Hypertension:     06/17/2024    3:32 PM  Causes  Drugs/Herbals Screened     - Comments No caffeine.  Rare EtOH.  No tob.  Renovascular HTN Screened  Sleep Apnea Screened     - Comments working to get CPAP  Thyroid  Disease Screened  Hyperaldosteronism Screened  Pheochromocytoma N/A  Cushing's Syndrome N/A  Hyperparathyroidism Screened  Coarctation of the Aorta Screened  Compliance Screened    Relevant Labs/Studies:    Latest Ref Rng & Units 06/01/2024    2:41 PM 04/29/2024    2:33 PM 03/19/2024    2:45 PM  Basic Labs  Sodium 135 - 145 mmol/L 141  145  144   Potassium 3.5 - 5.1 mmol/L 4.0  3.9  4.6   Creatinine 0.44 - 1.00 mg/dL 7.37  7.54  7.74        Latest Ref  Rng & Units 01/30/2024    1:23 PM 08/13/2023    9:56 AM  Thyroid    TSH 0.450 - 4.500 uIU/mL 1.910  1.95        Latest Ref Rng & Units 02/05/2024   11:59 AM  Renin/Aldosterone   Aldosterone 0.0 - 30.0 ng/dL 7.6              07/03/7972   10:14 AM  Renovascular   Renal Artery US  Completed Yes     Disposition:    FU with MD/PharmD in 2-3 months    Medication Adjustments/Labs and Tests Ordered: Current medicines are reviewed at length with the patient today.  Concerns regarding medicines are outlined above.  No orders of the defined types were placed in this encounter.  Meds ordered this encounter  Medications   cloNIDine  (CATAPRES ) 0.2 MG tablet    Sig: Take 1 tablet (0.2 mg total) by mouth 2 (two) times daily.    Dispense:  180 tablet    Refill:  3    New dose     Signed, Annabella Scarce, MD  06/17/2024 5:24 PM    [facility]     [1]  Current Meds  Medication Sig   amLODipine  (NORVASC ) 10 MG tablet Take 1 tablet (10 mg total) by mouth daily.   aspirin  EC 81 MG tablet Take 81 mg by mouth daily. Swallow whole.   atorvastatin  (LIPITOR) 40 MG tablet Take 1 tablet (40 mg total) by mouth daily.   busPIRone  (BUSPAR ) 7.5 MG tablet Take 1 tablet (7.5 mg total) by mouth 2 (two) times daily.   Cyanocobalamin  (VITAMIN B-12 PO) Take 1 tablet by mouth daily.   empagliflozin  (JARDIANCE ) 10 MG TABS tablet Take 10 mg by mouth daily.   empagliflozin  (JARDIANCE ) 10 MG TABS tablet Take 1 tablet (10 mg total) by mouth daily.   ferrous sulfate 325 (65 FE) MG tablet Take 325 mg by mouth  daily with breakfast.   furosemide  (LASIX ) 20 MG tablet Take 1 tablet (20 mg total) by mouth every 3 (three) days.   hydrALAZINE  (APRESOLINE ) 100 MG tablet Take 1 tablet (100 mg total) by mouth 3 (three) times daily.   isosorbide  mononitrate (IMDUR ) 30 MG 24 hr tablet Take 1 tablet (30 mg total) by mouth daily.   Lancets (ONETOUCH DELICA PLUS LANCET33G) MISC USE TO TEST 1-2 TIMES  DAILY   lidocaine  (LIDODERM ) 5 % Place 1 patch onto the skin daily. Remove & Discard patch within 12 hours or as directed by MD   omeprazole  (PRILOSEC) 20 MG capsule Take 20 mg by mouth daily.   ONETOUCH VERIO test strip Check blood sugar 1-2 times daily   potassium chloride  (MICRO-K ) 10 MEQ CR capsule Take 10 mEq by mouth 2 (two) times daily.   [DISCONTINUED] cloNIDine  (CATAPRES ) 0.1 MG tablet Take 1 tablet (0.1 mg total) by mouth 2 (two) times daily.

## 2024-06-17 NOTE — Patient Instructions (Signed)
 Medication Instructions:  INCREASE YOUR CLONIDINE  TO 0.2 MG TWICE A DAY   Labwork: NONE  Testing/Procedures: NONE  Follow-Up: 2 TO 3 MONTHS WITH DR Glencoe, CAITLIN W NP, OR KRISTIN A PHARM D   If you need a refill on your cardiac medications before your next appointment, please call your pharmacy.

## 2024-06-18 ENCOUNTER — Telehealth: Payer: Self-pay | Admitting: Licensed Clinical Social Worker

## 2024-06-18 NOTE — Telephone Encounter (Signed)
 H&V Care Navigation CSW Progress Note  Clinical Social Worker contacted patient by phone to f/u on community resources Olivia Morales may be interested in as she was connected to items from our food pantry. Noted additional health and partner health stressors also in the providers note. Was able to reach Olivia Morales at [phone]. Introduced self, role, reason for call. Inquired if this was an okay time for us  to chat about any additional community resources.   She stated she appreciated Dr. Skeeter bedside manner and assisted with the food pantry but before any additional resources could be shared/assessment completed, Olivia Morales let me know she was very upset that she was made to spend $25 to have someone tell her about something she's already known for 40 years and that I could have paid $15 to have my primary doctor Dallas Maxwell tell me the same thing. She shared part of her stress/discontent with experience, was that her boyfriend/partner [facility] and she left him to come to an appointment that she felt she didn't need to be at.   LCSW attempted to engage Olivia Morales on any additional community needs that she may have, but Olivia Morales continued to share how upset she was about having the appt yesterday; when I inquired if she knew there had been a recommendation for f/u in 2-3 months Olivia Morales was angry that an appt had been made. LCSW shared I would pass this on to our clinic lead and sent message to Burnard, RN supervisor regarding Olivia Morales dissatisfaction. I want to ensure that she does not get charged for a no show/the appt if she does not want to attend. I remain available as needed for any additional support/community services in the future if Olivia Morales interested.    Patient is participating in a Managed Medicaid Plan:  No, U[employer] Medicare.   SDOH Screenings   Food Insecurity: Food Insecurity Present (06/17/2024)  Housing: Low Risk (06/17/2024)  Transportation Needs: No Transportation Needs (06/17/2024)  Utilities: Not At  Risk (06/17/2024)  Alcohol Screen: Low Risk (06/17/2024)  Depression (PHQ2-9): Low Risk (06/01/2024)  Recent Concern: Depression (PHQ2-9) - High Risk (03/23/2024)  Financial Resource Strain: Low Risk (06/17/2024)  Physical Activity: Insufficiently Active (06/17/2024)  Social Connections: Moderately Integrated (06/17/2024)  Stress: Stress Concern Present (06/17/2024)  Tobacco Use: Medium Risk (06/17/2024)  Health Literacy: Adequate Health Literacy (06/17/2024)    Marit Lark, MSW, LCSW Clinical Social Worker II [facility] Heart/Vascular Care Navigation  [phone]- work cell phone (preferred)

## 2024-06-23 ENCOUNTER — Other Ambulatory Visit (HOSPITAL_BASED_OUTPATIENT_CLINIC_OR_DEPARTMENT_OTHER): Payer: Self-pay

## 2024-06-29 ENCOUNTER — Other Ambulatory Visit (HOSPITAL_BASED_OUTPATIENT_CLINIC_OR_DEPARTMENT_OTHER): Payer: Self-pay

## 2024-06-29 ENCOUNTER — Other Ambulatory Visit: Payer: Self-pay | Admitting: Medical

## 2024-06-29 MED ORDER — ISOSORBIDE MONONITRATE ER 30 MG PO TB24
30.0000 mg | ORAL_TABLET | Freq: Every day | ORAL | 0 refills | Status: AC
  Filled 2024-06-29: qty 90, 90d supply, fill #0

## 2024-06-30 ENCOUNTER — Other Ambulatory Visit (HOSPITAL_BASED_OUTPATIENT_CLINIC_OR_DEPARTMENT_OTHER): Payer: Self-pay

## 2024-07-03 ENCOUNTER — Inpatient Hospital Stay: Admitting: Family

## 2024-07-03 ENCOUNTER — Inpatient Hospital Stay: Attending: Medical Oncology

## 2024-07-03 ENCOUNTER — Inpatient Hospital Stay

## 2024-07-03 VITALS — BP 149/82 | HR 70 | Temp 99.0°F | Wt 136.8 lb

## 2024-07-03 DIAGNOSIS — E538 Deficiency of other specified B group vitamins: Secondary | ICD-10-CM | POA: Diagnosis not present

## 2024-07-03 DIAGNOSIS — D631 Anemia in chronic kidney disease: Secondary | ICD-10-CM | POA: Diagnosis not present

## 2024-07-03 DIAGNOSIS — D573 Sickle-cell trait: Secondary | ICD-10-CM | POA: Diagnosis not present

## 2024-07-03 DIAGNOSIS — D509 Iron deficiency anemia, unspecified: Secondary | ICD-10-CM

## 2024-07-03 DIAGNOSIS — Z79899 Other long term (current) drug therapy: Secondary | ICD-10-CM | POA: Insufficient documentation

## 2024-07-03 DIAGNOSIS — D72829 Elevated white blood cell count, unspecified: Secondary | ICD-10-CM | POA: Diagnosis not present

## 2024-07-03 DIAGNOSIS — N189 Chronic kidney disease, unspecified: Secondary | ICD-10-CM | POA: Diagnosis not present

## 2024-07-03 DIAGNOSIS — N184 Chronic kidney disease, stage 4 (severe): Secondary | ICD-10-CM | POA: Insufficient documentation

## 2024-07-03 LAB — CMP (CANCER CENTER ONLY)
ALT: 8 U/L (ref 0–44)
AST: 20 U/L (ref 15–41)
Albumin: 4 g/dL (ref 3.5–5.0)
Alkaline Phosphatase: 77 U/L (ref 38–126)
Anion gap: 11 (ref 5–15)
BUN: 26 mg/dL — ABNORMAL HIGH (ref 8–23)
CO2: 23 mmol/L (ref 22–32)
Calcium: 9.4 mg/dL (ref 8.9–10.3)
Chloride: 108 mmol/L (ref 98–111)
Creatinine: 2.57 mg/dL — ABNORMAL HIGH (ref 0.44–1.00)
GFR, Estimated: 19 mL/min — ABNORMAL LOW
Glucose, Bld: 182 mg/dL — ABNORMAL HIGH (ref 70–99)
Potassium: 3.8 mmol/L (ref 3.5–5.1)
Sodium: 142 mmol/L (ref 135–145)
Total Bilirubin: 0.5 mg/dL (ref 0.0–1.2)
Total Protein: 6.6 g/dL (ref 6.5–8.1)

## 2024-07-03 LAB — CBC WITH DIFFERENTIAL (CANCER CENTER ONLY)
Abs Immature Granulocytes: 0.01 K/uL (ref 0.00–0.07)
Basophils Absolute: 0 K/uL (ref 0.0–0.1)
Basophils Relative: 1 %
Eosinophils Absolute: 0.1 K/uL (ref 0.0–0.5)
Eosinophils Relative: 2 %
HCT: 35.1 % — ABNORMAL LOW (ref 36.0–46.0)
Hemoglobin: 11.4 g/dL — ABNORMAL LOW (ref 12.0–15.0)
Immature Granulocytes: 0 %
Lymphocytes Relative: 20 %
Lymphs Abs: 0.8 K/uL (ref 0.7–4.0)
MCH: 27 pg (ref 26.0–34.0)
MCHC: 32.5 g/dL (ref 30.0–36.0)
MCV: 83.2 fL (ref 80.0–100.0)
Monocytes Absolute: 0.3 K/uL (ref 0.1–1.0)
Monocytes Relative: 9 %
Neutro Abs: 2.6 K/uL (ref 1.7–7.7)
Neutrophils Relative %: 68 %
Platelet Count: 270 K/uL (ref 150–400)
RBC: 4.22 MIL/uL (ref 3.87–5.11)
RDW: 15.4 % (ref 11.5–15.5)
WBC Count: 3.9 K/uL — ABNORMAL LOW (ref 4.0–10.5)
nRBC: 0 % (ref 0.0–0.2)

## 2024-07-03 LAB — IRON AND IRON BINDING CAPACITY (CC-WL,HP ONLY)
Iron: 80 ug/dL (ref 28–170)
Saturation Ratios: 33 % — ABNORMAL HIGH (ref 10.4–31.8)
TIBC: 244 ug/dL — ABNORMAL LOW (ref 250–450)
UIBC: 164 ug/dL

## 2024-07-03 LAB — FERRITIN: Ferritin: 333 ng/mL — ABNORMAL HIGH (ref 11–307)

## 2024-07-03 LAB — FOLATE: Folate: 8.2 ng/mL

## 2024-07-03 LAB — VITAMIN B12: Vitamin B-12: 778 pg/mL (ref 180–914)

## 2024-07-03 NOTE — Progress Notes (Signed)
 " Hematology and Oncology Follow Up Visit  Olivia Morales [id number] 08-23-1950 74 y.o. 07/03/2024   Principle Diagnosis:  Anemia in the setting of CKD Sickle cell trait   Current Therapy:        Aranesp  300 mg Q 3 weeks for Hgb <11   Interim History:  Olivia Morales is here today for follow-up. Hgb is stable today at 11.4.  She is doing well and has no complaints at this time.  No c/o fatigue.  No fever, chills, n/v, cough, rash, dizziness, SOB, chest pain, palpitations, abdominal pain or changes in bowel or bladder habits.  Mild swelling in her feet and ankles which she feels is improved on Lasix  now taking every other day.  No numbness or tingling in her extremities. No falls or syncope. Appetite and hydration are good. Weight is stable at 136 lbs.  ECOG Performance Status: 1 - Symptomatic but completely ambulatory  Medications:  Allergies as of 07/03/2024       Reactions   Ace Inhibitors Cough        Medication List        Accurate as of July 03, 2024  1:36 PM. If you have any questions, ask your nurse or doctor.          amLODipine  10 MG tablet Commonly known as: NORVASC  Take 1 tablet (10 mg total) by mouth daily.   aspirin  EC 81 MG tablet Take 81 mg by mouth daily. Swallow whole.   atorvastatin  40 MG tablet Commonly known as: LIPITOR Take 1 tablet (40 mg total) by mouth daily.   busPIRone  7.5 MG tablet Commonly known as: BUSPAR  Take 1 tablet (7.5 mg total) by mouth 2 (two) times daily.   cloNIDine  0.2 MG tablet Commonly known as: CATAPRES  Take 1 tablet (0.2 mg total) by mouth 2 (two) times daily.   empagliflozin  10 MG Tabs tablet Commonly known as: JARDIANCE  Take 10 mg by mouth daily.   Jardiance  10 MG Tabs tablet Generic drug: empagliflozin  Take 1 tablet (10 mg total) by mouth daily.   ferrous sulfate 325 (65 FE) MG tablet Take 325 mg by mouth daily with breakfast.   furosemide  20 MG tablet Commonly known as: LASIX  Take 1 tablet  (20 mg total) by mouth every 3 (three) days.   hydrALAZINE  100 MG tablet Commonly known as: APRESOLINE  Take 1 tablet (100 mg total) by mouth 3 (three) times daily.   isosorbide  mononitrate 30 MG 24 hr tablet Commonly known as: IMDUR  Take 1 tablet (30 mg total) by mouth daily.   lidocaine  5 % Commonly known as: Lidoderm  Place 1 patch onto the skin daily. Remove & Discard patch within 12 hours or as directed by MD   omeprazole  20 MG capsule Commonly known as: PRILOSEC Take 20 mg by mouth daily.   OneTouch Delica Plus Lancet33G Misc USE TO TEST 1-2 TIMES DAILY   OneTouch Verio test strip Generic drug: glucose blood Check blood sugar 1-2 times daily   potassium chloride  10 MEQ CR capsule Commonly known as: MICRO-K  Take 10 mEq by mouth 2 (two) times daily.   VITAMIN B-12 PO Take 1 tablet by mouth daily.        Allergies: Allergies[1]  Past Medical History, Surgical history, Social history, and Family History were reviewed and updated.  Review of Systems: All other 10 point review of systems is negative.   Physical Exam:  vitals were not taken for this visit.   Wt Readings from Last 3 Encounters:  06/17/24 140  lb (63.5 kg)  06/01/24 138 lb 12.8 oz (63 kg)  04/29/24 133 lb (60.3 kg)    Ocular: Sclerae unicteric, pupils equal, round and reactive to light Ear-nose-throat: Oropharynx clear, dentition fair Lymphatic: No cervical or supraclavicular adenopathy Lungs no rales or rhonchi, good excursion bilaterally Heart regular rate and rhythm, no murmur appreciated Abd soft, nontender, positive bowel sounds MSK no focal spinal tenderness, no joint edema Neuro: non-focal, well-oriented, appropriate affect Breasts: Deferred   Lab Results  Component Value Date   WBC 2.5 (L) 06/01/2024   HGB 10.1 (L) 06/01/2024   HCT 31.8 (L) 06/01/2024   MCV 85.0 06/01/2024   PLT 272 06/01/2024   Lab Results  Component Value Date   FERRITIN 225 06/01/2024   IRON 59 06/01/2024    TIBC 252 06/01/2024   UIBC 193 06/01/2024   IRONPCTSAT 23 06/01/2024   Lab Results  Component Value Date   RETICCTPCT 0.6 06/01/2024   RBC 3.76 (L) 06/01/2024   RBC 3.74 (L) 06/01/2024   Lab Results  Component Value Date   KPAFRELGTCHN 43.6 (H) 11/12/2023   LAMBDASER 42.1 (H) 11/12/2023   KAPLAMBRATIO 4.92 12/03/2023   Lab Results  Component Value Date   IGGSERUM 1,080 11/12/2023   IGA 99 11/12/2023   IGMSERUM 64 11/12/2023   Lab Results  Component Value Date   TOTALPROTELP 6.2 11/12/2023     Chemistry      Component Value Date/Time   NA 141 06/01/2024 1441   NA 141 11/06/2023 1517   K 4.0 06/01/2024 1441   CL 106 06/01/2024 1441   CO2 24 06/01/2024 1441   BUN 30 (H) 06/01/2024 1441   BUN 19 11/06/2023 1517   CREATININE 2.62 (H) 06/01/2024 1441   CREATININE 2.22 (H) 02/14/2024 1450      Component Value Date/Time   CALCIUM  9.3 06/01/2024 1441   ALKPHOS 78 06/01/2024 1441   AST 22 06/01/2024 1441   ALT 12 06/01/2024 1441   BILITOT 0.3 06/01/2024 1441       Impression and Plan: Patient is a 74 yo female with anemia secondary to CKD as well as having the sickle cell trait.  No ESA needed for Hgb 11.4.  Iron studies and B 12 pending.  Follow-up in 1 month.   Olivia Pepper, NP 1/2/20261:36 PM     [1]  Allergies Allergen Reactions   Ace Inhibitors Cough   "

## 2024-07-06 ENCOUNTER — Other Ambulatory Visit (HOSPITAL_BASED_OUTPATIENT_CLINIC_OR_DEPARTMENT_OTHER): Payer: Self-pay

## 2024-07-10 ENCOUNTER — Other Ambulatory Visit (HOSPITAL_BASED_OUTPATIENT_CLINIC_OR_DEPARTMENT_OTHER): Payer: Self-pay

## 2024-07-10 ENCOUNTER — Encounter: Payer: Self-pay | Admitting: Hematology & Oncology

## 2024-07-21 ENCOUNTER — Other Ambulatory Visit (HOSPITAL_BASED_OUTPATIENT_CLINIC_OR_DEPARTMENT_OTHER): Payer: Self-pay

## 2024-07-21 ENCOUNTER — Encounter: Payer: Self-pay | Admitting: Hematology & Oncology

## 2024-07-24 ENCOUNTER — Other Ambulatory Visit (HOSPITAL_BASED_OUTPATIENT_CLINIC_OR_DEPARTMENT_OTHER): Payer: Self-pay

## 2024-07-24 ENCOUNTER — Other Ambulatory Visit (HOSPITAL_COMMUNITY): Payer: Self-pay

## 2024-07-24 ENCOUNTER — Other Ambulatory Visit: Payer: Self-pay

## 2024-07-29 ENCOUNTER — Inpatient Hospital Stay

## 2024-07-29 ENCOUNTER — Inpatient Hospital Stay: Admitting: Family

## 2024-08-04 ENCOUNTER — Ambulatory Visit: Admitting: Medical

## 2024-08-04 ENCOUNTER — Inpatient Hospital Stay: Admitting: Family

## 2024-08-04 ENCOUNTER — Inpatient Hospital Stay

## 2024-08-04 ENCOUNTER — Encounter: Payer: Self-pay | Admitting: Family

## 2024-08-04 ENCOUNTER — Telehealth: Payer: Self-pay

## 2024-08-04 ENCOUNTER — Other Ambulatory Visit: Payer: Self-pay

## 2024-08-04 ENCOUNTER — Inpatient Hospital Stay: Attending: Medical Oncology

## 2024-08-04 VITALS — BP 146/71 | HR 66 | Temp 98.0°F | Resp 18 | Wt 136.0 lb

## 2024-08-04 DIAGNOSIS — E538 Deficiency of other specified B group vitamins: Secondary | ICD-10-CM | POA: Diagnosis not present

## 2024-08-04 DIAGNOSIS — N189 Chronic kidney disease, unspecified: Secondary | ICD-10-CM | POA: Diagnosis not present

## 2024-08-04 DIAGNOSIS — D509 Iron deficiency anemia, unspecified: Secondary | ICD-10-CM

## 2024-08-04 DIAGNOSIS — D631 Anemia in chronic kidney disease: Secondary | ICD-10-CM | POA: Diagnosis not present

## 2024-08-04 DIAGNOSIS — D72829 Elevated white blood cell count, unspecified: Secondary | ICD-10-CM

## 2024-08-04 LAB — CMP (CANCER CENTER ONLY)
ALT: 7 U/L (ref 0–44)
AST: 17 U/L (ref 15–41)
Albumin: 3.8 g/dL (ref 3.5–5.0)
Alkaline Phosphatase: 82 U/L (ref 38–126)
Anion gap: 13 (ref 5–15)
BUN: 29 mg/dL — ABNORMAL HIGH (ref 8–23)
CO2: 21 mmol/L — ABNORMAL LOW (ref 22–32)
Calcium: 9.3 mg/dL (ref 8.9–10.3)
Chloride: 110 mmol/L (ref 98–111)
Creatinine: 2.92 mg/dL — ABNORMAL HIGH (ref 0.44–1.00)
GFR, Estimated: 17 mL/min — ABNORMAL LOW
Glucose, Bld: 116 mg/dL — ABNORMAL HIGH (ref 70–99)
Potassium: 4.3 mmol/L (ref 3.5–5.1)
Sodium: 144 mmol/L (ref 135–145)
Total Bilirubin: 0.4 mg/dL (ref 0.0–1.2)
Total Protein: 6.6 g/dL (ref 6.5–8.1)

## 2024-08-04 LAB — CBC WITH DIFFERENTIAL (CANCER CENTER ONLY)
Abs Immature Granulocytes: 0.01 10*3/uL (ref 0.00–0.07)
Basophils Absolute: 0 10*3/uL (ref 0.0–0.1)
Basophils Relative: 1 %
Eosinophils Absolute: 0.1 10*3/uL (ref 0.0–0.5)
Eosinophils Relative: 4 %
HCT: 28.6 % — ABNORMAL LOW (ref 36.0–46.0)
Hemoglobin: 9.4 g/dL — ABNORMAL LOW (ref 12.0–15.0)
Immature Granulocytes: 0 %
Lymphocytes Relative: 31 %
Lymphs Abs: 0.9 10*3/uL (ref 0.7–4.0)
MCH: 26.6 pg (ref 26.0–34.0)
MCHC: 32.9 g/dL (ref 30.0–36.0)
MCV: 80.8 fL (ref 80.0–100.0)
Monocytes Absolute: 0.4 10*3/uL (ref 0.1–1.0)
Monocytes Relative: 13 %
Neutro Abs: 1.4 10*3/uL — ABNORMAL LOW (ref 1.7–7.7)
Neutrophils Relative %: 51 %
Platelet Count: 240 10*3/uL (ref 150–400)
RBC: 3.54 MIL/uL — ABNORMAL LOW (ref 3.87–5.11)
RDW: 15.7 % — ABNORMAL HIGH (ref 11.5–15.5)
WBC Count: 2.8 10*3/uL — ABNORMAL LOW (ref 4.0–10.5)
nRBC: 0 % (ref 0.0–0.2)

## 2024-08-04 LAB — VITAMIN B12: Vitamin B-12: 1194 pg/mL — ABNORMAL HIGH (ref 180–914)

## 2024-08-04 LAB — FOLATE: Folate: 6.6 ng/mL

## 2024-08-04 LAB — IRON AND IRON BINDING CAPACITY (CC-WL,HP ONLY)
Iron: 63 ug/dL (ref 28–170)
Saturation Ratios: 26 % (ref 10.4–31.8)
TIBC: 241 ug/dL — ABNORMAL LOW (ref 250–450)
UIBC: 177 ug/dL

## 2024-08-04 LAB — FERRITIN: Ferritin: 469 ng/mL — ABNORMAL HIGH (ref 11–307)

## 2024-08-04 MED ORDER — DARBEPOETIN ALFA 300 MCG/0.6ML IJ SOSY
300.0000 ug | PREFILLED_SYRINGE | Freq: Once | INTRAMUSCULAR | Status: AC
  Administered 2024-08-04: 300 ug via SUBCUTANEOUS
  Filled 2024-08-04: qty 0.6

## 2024-08-04 NOTE — Progress Notes (Signed)
 " Hematology and Oncology Follow Up Visit  Olivia Morales [id number] 03-10-1951 74 y.o. 08/04/2024   Principle Diagnosis:  Anemia in the setting of CKD Sickle cell trait   Current Therapy:        Aranesp  300 mg Q 3 weeks for Hgb <11   Interim History:  Ms. Olivia Morales is here today for follow-up. She is doing quite well and has no complaints at this time.  No blood loss noted. Hgb 9.4, MCV 80, platelets 240 and WBC count is 2.8.  No fever, chills, n/v, cough, rash, dizziness, SOB, chest pain, palpitations, abdominal pain or changes in bowel or bladder habits.  No swelling, tenderness, numbness or tingling in her extremities.  No falls or syncope reported.  Appetite and hydration are good. Weight is stable at 136 lbs.   ECOG Performance Status: 1 - Symptomatic but completely ambulatory  Medications:  Allergies as of 08/04/2024       Reactions   Ace Inhibitors Cough        Medication List        Accurate as of August 04, 2024 11:56 AM. If you have any questions, ask your nurse or doctor.          amLODipine  10 MG tablet Commonly known as: NORVASC  Take 1 tablet (10 mg total) by mouth daily.   aspirin  EC 81 MG tablet Take 81 mg by mouth daily. Swallow whole.   atorvastatin  40 MG tablet Commonly known as: LIPITOR Take 1 tablet (40 mg total) by mouth daily.   busPIRone  7.5 MG tablet Commonly known as: BUSPAR  Take 1 tablet (7.5 mg total) by mouth 2 (two) times daily.   cloNIDine  0.2 MG tablet Commonly known as: CATAPRES  Take 1 tablet (0.2 mg total) by mouth 2 (two) times daily.   empagliflozin  10 MG Tabs tablet Commonly known as: JARDIANCE  Take 10 mg by mouth daily.   Jardiance  10 MG Tabs tablet Generic drug: empagliflozin  Take 1 tablet (10 mg total) by mouth daily.   ferrous sulfate 325 (65 FE) MG tablet Take 325 mg by mouth daily with breakfast.   furosemide  20 MG tablet Commonly known as: LASIX  Take 1 tablet (20 mg total) by mouth every 3  (three) days.   hydrALAZINE  100 MG tablet Commonly known as: APRESOLINE  Take 1 tablet (100 mg total) by mouth 3 (three) times daily.   isosorbide  mononitrate 30 MG 24 hr tablet Commonly known as: IMDUR  Take 1 tablet (30 mg total) by mouth daily.   lidocaine  5 % Commonly known as: Lidoderm  Place 1 patch onto the skin daily. Remove & Discard patch within 12 hours or as directed by MD   omeprazole  20 MG capsule Commonly known as: PRILOSEC Take 20 mg by mouth daily.   OneTouch Delica Plus Lancet33G Misc USE TO TEST 1-2 TIMES DAILY   OneTouch Verio test strip Generic drug: glucose blood Check blood sugar 1-2 times daily   potassium chloride  10 MEQ CR capsule Commonly known as: MICRO-K  Take 10 mEq by mouth 2 (two) times daily.   VITAMIN B-12 PO Take 1 tablet by mouth daily.        Allergies: Allergies[1]  Past Medical History, Surgical history, Social history, and Family History were reviewed and updated.  Review of Systems: All other 10 point review of systems is negative.   Physical Exam:  vitals were not taken for this visit.   Wt Readings from Last 3 Encounters:  07/03/24 136 lb 12.8 oz (62.1 kg)  06/17/24 140 lb (  63.5 kg)  06/01/24 138 lb 12.8 oz (63 kg)    Ocular: Sclerae unicteric, pupils equal, round and reactive to light Ear-nose-throat: Oropharynx clear, dentition fair Lymphatic: No cervical or supraclavicular adenopathy Lungs no rales or rhonchi, good excursion bilaterally Heart regular rate and rhythm, no murmur appreciated Abd soft, nontender, positive bowel sounds MSK no focal spinal tenderness, no joint edema Neuro: non-focal, well-oriented, appropriate affect Breasts: Deferred   Lab Results  Component Value Date   WBC 2.8 (L) 08/04/2024   HGB 9.4 (L) 08/04/2024   HCT 28.6 (L) 08/04/2024   MCV 80.8 08/04/2024   PLT 240 08/04/2024   Lab Results  Component Value Date   FERRITIN 333 (H) 07/03/2024   IRON 80 07/03/2024   TIBC 244 (L)  07/03/2024   UIBC 164 07/03/2024   IRONPCTSAT 33 (H) 07/03/2024   Lab Results  Component Value Date   RETICCTPCT 0.6 06/01/2024   RBC 3.54 (L) 08/04/2024   Lab Results  Component Value Date   KPAFRELGTCHN 43.6 (H) 11/12/2023   LAMBDASER 42.1 (H) 11/12/2023   KAPLAMBRATIO 4.92 12/03/2023   Lab Results  Component Value Date   IGGSERUM 1,080 11/12/2023   IGA 99 11/12/2023   IGMSERUM 64 11/12/2023   Lab Results  Component Value Date   TOTALPROTELP 6.2 11/12/2023     Chemistry      Component Value Date/Time   NA 142 07/03/2024 1327   NA 141 11/06/2023 1517   K 3.8 07/03/2024 1327   CL 108 07/03/2024 1327   CO2 23 07/03/2024 1327   BUN 26 (H) 07/03/2024 1327   BUN 19 11/06/2023 1517   CREATININE 2.57 (H) 07/03/2024 1327   CREATININE 2.22 (H) 02/14/2024 1450      Component Value Date/Time   CALCIUM  9.4 07/03/2024 1327   ALKPHOS 77 07/03/2024 1327   AST 20 07/03/2024 1327   ALT 8 07/03/2024 1327   BILITOT 0.5 07/03/2024 1327       Impression and Plan:  Patient is a 74 yo female with anemia secondary to CKD as well as having the sickle cell trait.  ESA given for Hgb 9.4.  Iron studies and B 12 pending.  Follow-up in 1 month.   Lauraine Pepper, NP 2/3/202611:56 AM     [1]  Allergies Allergen Reactions   Ace Inhibitors Cough   "

## 2024-08-04 NOTE — Patient Instructions (Signed)

## 2024-08-04 NOTE — Telephone Encounter (Signed)
 Called pt to see was everything ok and why she didn't show for her apt today. She says they got to the office at 9 am and no one ever told her the office was closed today. I advised her that we opened at 10. She then told me they are at the cancer center due to [facility] having some issues. Advised her that she could call back to schedule her and significant other apt or I will call her back next week. She agreed

## 2024-08-05 ENCOUNTER — Inpatient Hospital Stay: Admitting: Family

## 2024-08-05 ENCOUNTER — Inpatient Hospital Stay

## 2024-08-05 ENCOUNTER — Encounter: Payer: Self-pay | Admitting: Family

## 2024-09-02 ENCOUNTER — Encounter (HOSPITAL_BASED_OUTPATIENT_CLINIC_OR_DEPARTMENT_OTHER): Admitting: Cardiovascular Disease

## 2024-09-04 ENCOUNTER — Inpatient Hospital Stay

## 2024-09-04 ENCOUNTER — Inpatient Hospital Stay: Admitting: Family
